# Patient Record
Sex: Male | Born: 1960
Health system: Southern US, Community
[De-identification: ages and names within clinical notes are randomized; demographics above are authoritative.]

## PROBLEM LIST (undated history)

## (undated) DIAGNOSIS — Z872 Personal history of diseases of the skin and subcutaneous tissue: Secondary | ICD-10-CM

## (undated) DIAGNOSIS — K4090 Unilateral inguinal hernia, without obstruction or gangrene, not specified as recurrent: Secondary | ICD-10-CM

## (undated) DIAGNOSIS — K2100 Gastro-esophageal reflux disease with esophagitis, without bleeding: Secondary | ICD-10-CM

## (undated) DIAGNOSIS — E781 Pure hyperglyceridemia: Secondary | ICD-10-CM

## (undated) DIAGNOSIS — I10 Essential (primary) hypertension: Secondary | ICD-10-CM

## (undated) DIAGNOSIS — C801 Malignant (primary) neoplasm, unspecified: Secondary | ICD-10-CM

## (undated) DIAGNOSIS — K219 Gastro-esophageal reflux disease without esophagitis: Secondary | ICD-10-CM

## (undated) DIAGNOSIS — K5792 Diverticulitis of intestine, part unspecified, without perforation or abscess without bleeding: Secondary | ICD-10-CM

## (undated) DIAGNOSIS — G4730 Sleep apnea, unspecified: Secondary | ICD-10-CM

## (undated) DIAGNOSIS — M069 Rheumatoid arthritis, unspecified: Secondary | ICD-10-CM

## (undated) DIAGNOSIS — M1990 Unspecified osteoarthritis, unspecified site: Secondary | ICD-10-CM

## (undated) HISTORY — PX: COLONOSCOPY: SHX174

## (undated) HISTORY — DX: Gastro-esophageal reflux disease with esophagitis, without bleeding: K21.00

## (undated) HISTORY — DX: Gastro-esophageal reflux disease with esophagitis: K21.0

## (undated) HISTORY — PX: OTHER SURGICAL HISTORY: SHX169

## (undated) HISTORY — DX: Unspecified osteoarthritis, unspecified site: M19.90

## (undated) HISTORY — PX: MELANOMA EXCISION: SHX5266

---

## 2000-10-09 HISTORY — PX: OTHER SURGICAL HISTORY: SHX169

## 2010-10-14 ENCOUNTER — Ambulatory Visit
Admission: RE | Admit: 2010-10-14 | Discharge: 2010-10-14 | Payer: Self-pay | Source: Home / Self Care | Admitting: Emergency Medicine

## 2010-10-14 DIAGNOSIS — S058X9A Other injuries of unspecified eye and orbit, initial encounter: Secondary | ICD-10-CM | POA: Insufficient documentation

## 2010-10-14 DIAGNOSIS — M069 Rheumatoid arthritis, unspecified: Secondary | ICD-10-CM | POA: Insufficient documentation

## 2010-10-17 ENCOUNTER — Telehealth (INDEPENDENT_AMBULATORY_CARE_PROVIDER_SITE_OTHER): Payer: Self-pay | Admitting: *Deleted

## 2010-11-10 NOTE — Assessment & Plan Note (Signed)
Summary: FOREIGN BODY RT EYE/WSE   Vital Signs:  Patient Profile:   50 Years Old Male  Vitals Entered By: Lajean Saver RN (October 14, 2010 6:32 PM)              Vision Screening: Left eye w/o correction: 20 / 40 Right Eye w/o correction: 20 / 40 Both eyes w/o correction:  20/ 40        Vision Entered By: Lajean Saver RN (October 14, 2010 6:55 PM)    Updated Prior Medication List: HUMIRA 40 MG/0.8ML KIT (ADALIMUMAB) every other week METHOTREXATE 2.5 MG TABS (METHOTREXATE SODIUM)  PREDNISONE 1 MG TABS (PREDNISONE) once daily  Current Allergies: No known allergies History of Present Illness History from: patient Chief Complaint: R eye irritation History of Present Illness: Was taking out trash 2 days ago and had something blow into his R eye.  Has felt that there has been something in it since then with redness, irritation, and now with pus discharge.  Visine and flushing eye helps temporarily.  Helps to keep that eye shut.  REVIEW OF SYSTEMS Constitutional Symptoms      Denies fever, chills, night sweats, weight loss, weight gain, and fatigue.  Eyes       Complains of eye pain, eye drainage, and glasses.      Denies change in vision, contact lenses, and eye surgery.      Comments: right eye Ear/Nose/Throat/Mouth       Denies hearing loss/aids, change in hearing, ear pain, ear discharge, dizziness, frequent runny nose, frequent nose bleeds, sinus problems, sore throat, hoarseness, and tooth pain or bleeding.  Respiratory       Denies dry cough, productive cough, wheezing, shortness of breath, asthma, bronchitis, and emphysema/COPD.  Cardiovascular       Denies murmurs, chest pain, and tires easily with exhertion.    Gastrointestinal       Denies stomach pain, nausea/vomiting, diarrhea, constipation, blood in bowel movements, and indigestion. Genitourniary       Denies painful urination, kidney stones, and loss of urinary control. Neurological       Denies  paralysis, seizures, and fainting/blackouts. Musculoskeletal       Denies muscle pain, joint pain, joint stiffness, decreased range of motion, redness, swelling, muscle weakness, and gout.  Skin       Denies bruising, unusual mles/lumps or sores, and hair/skin or nail changes.  Psych       Denies mood changes, temper/anger issues, anxiety/stress, speech problems, depression, and sleep problems. Other Comments: Patient c/o foreign object in right eye x last night. He has tried washing it out several times unsuccessfully. Redness and drainage are present.    Past History:  Past Medical History: Rheumatoid arthritis  Past Surgical History: Feet surgery x 2 1990s  Social History: Married Never Smoked Alcohol use-no Drug use-no Smoking Status:  never Drug Use:  no Physical Exam General appearance: well developed, well nourished, mild distress Fluoroscein exam: PERRLA EOMI, conjuctival and scleral erythema, small hair found but no other foreign bodies seen, no dendritic lesions, corneal abrasion at 12 o'clock seen with possible very small ulcer.  After tetracaine, patient able to open eye and states that he feels better and vision is improved.  He has purulent discharge and photophobia as well. Assessment New Problems: CORNEAL ABRASION, RIGHT (ICD-918.1) RHEUMATOID ARTHRITIS (ICD-714.0)   Patient Education: Patient and/or caregiver instructed in the following: rest, fluids.  Plan New Medications/Changes: POLYTRIM 10000-0.1 UNIT/ML-% SOLN (POLYMYXIN B-TRIMETHOPRIM) 2 drops  R eye Q3 hrs for 2 days, then Q8 hrs for 1 week  #1 bottle x 0, 10/14/2010, Hoyt Koch MD  New Orders: New Patient Level IV [phone] Planning Comments:   Fluoroscein exam done in clinic.  Rx for Polytrim.  If eye is not feeling dramatically better in 1-2 days, would suggest having him see ophthalmology on Monday when they open.  If visual problems or worsening, then go to ER.  Use drops as directed, can  continue to use sterile saline to flush, and wipe crust with warm washcloth.   The patient and/or caregiver has been counseled thoroughly with regard to medications prescribed including dosage, schedule, interactions, rationale for use, and possible side effects and they verbalize understanding.  Diagnoses and expected course of recovery discussed and will return if not improved as expected or if the condition worsens. Patient and/or caregiver verbalized understanding.  Prescriptions: POLYTRIM 10000-0.1 UNIT/ML-% SOLN (POLYMYXIN B-TRIMETHOPRIM) 2 drops R eye Q3 hrs for 2 days, then Q8 hrs for 1 week  #1 bottle x 0   Entered and Authorized by:   Hoyt Koch MD   Signed by:   Hoyt Koch MD on 10/14/2010   Method used:   Print then Give to Patient   RxID:   [id number]   Orders Added: 1)  New Patient Level IV [95621]  Appended Document: FOREIGN BODY RT EYE/WSE Vitals: 137/87 HR: 83 RR: 16 O2sat: 97% Temp: 97.4 74" 191.2 lbs

## 2010-11-10 NOTE — Progress Notes (Signed)
  Phone Note Outgoing Call Call back at [facility] Phone [phone]   Call placed by: Lajean Saver RN,  October 17, 2010 10:58 AM Call placed to: Patient Action Taken: Phone Call Completed Summary of Call: Callback: Patient reports his eye is better. He is still a little light sensitive. He will call back if it gets worse.

## 2011-08-10 ENCOUNTER — Encounter: Payer: Self-pay | Admitting: Family Medicine

## 2011-08-15 ENCOUNTER — Ambulatory Visit (INDEPENDENT_AMBULATORY_CARE_PROVIDER_SITE_OTHER): Payer: Managed Care, Other (non HMO) | Admitting: Family Medicine

## 2011-08-15 ENCOUNTER — Encounter: Payer: Self-pay | Admitting: Family Medicine

## 2011-08-15 VITALS — BP 154/91 | HR 75 | Ht 72.0 in | Wt 196.0 lb

## 2011-08-15 DIAGNOSIS — Z23 Encounter for immunization: Secondary | ICD-10-CM

## 2011-08-15 DIAGNOSIS — I10 Essential (primary) hypertension: Secondary | ICD-10-CM | POA: Insufficient documentation

## 2011-08-15 DIAGNOSIS — Z0000 Encounter for general adult medical examination without abnormal findings: Secondary | ICD-10-CM

## 2011-08-15 DIAGNOSIS — R319 Hematuria, unspecified: Secondary | ICD-10-CM

## 2011-08-15 DIAGNOSIS — Z136 Encounter for screening for cardiovascular disorders: Secondary | ICD-10-CM

## 2011-08-15 DIAGNOSIS — R030 Elevated blood-pressure reading, without diagnosis of hypertension: Secondary | ICD-10-CM

## 2011-08-15 MED ORDER — ESOMEPRAZOLE MAGNESIUM 40 MG PO CPDR: 40.0000 mg | DELAYED_RELEASE_CAPSULE | Freq: Every day | ORAL | Status: DC

## 2011-08-15 MED ORDER — CIPROFLOXACIN HCL 500 MG PO TABS: 500.0000 mg | ORAL_TABLET | Freq: Two times a day (BID) | ORAL | Status: AC

## 2011-08-15 NOTE — Patient Instructions (Signed)
Hematuria, Adult Hematuria (blood in your urine) can be caused by a bladder infection (cystitis), kidney infection (pyelonephritis), prostate infection (prostatitis), or kidney stone. Infections will usually respond to antibiotics (medications which kill germs), and a kidney stone will usually pass through your urine without further treatment. If you were put on antibiotics, take all the medicine until gone. You may feel better in a few days, but take all of your medicine or the infection may not respond and become more difficult to treat. If antibiotics were not given, an infection did not cause the blood in the urine. A further work up to find out the reason may be needed. HOME CARE INSTRUCTIONS   Drink lots of fluid, 3 to 4 quarts a day. If you have been diagnosed with an infection, cranberry juice is especially recommended, in addition to large amounts of water.   Avoid caffeine, tea, and carbonated beverages, because they tend to irritate the bladder.   Avoid alcohol as it may irritate the prostate.   Only take over-the-counter or prescription medicines for pain, discomfort, or fever as directed by your caregiver.   If you have been diagnosed with a kidney stone follow your caregivers instructions regarding straining your urine to catch the stone.  TO PREVENT FURTHER INFECTIONS:  Empty the bladder often. Avoid holding urine for long periods of time.   After a bowel movement, women should cleanse front to back. Use each tissue only once.   Empty the bladder before and after sexual intercourse if you are a male.   Return to your caregiver if you develop back pain, fever, nausea (feeling sick to your stomach), vomiting, or your symptoms (problems) are not better in 3 days. Return sooner if you are getting worse.  If you have been requested to return for further testing make sure to keep your appointments. If an infection is not the cause of blood in your urine, X-rays may be required. Your  caregiver will discuss this with you. SEEK IMMEDIATE MEDICAL CARE IF:   You have a persistent fever over 102 F (38.9 C).   You develop severe vomiting and are unable to keep the medication down.   You develop severe back or abdominal pain despite taking your medications.   You begin passing a large amount of blood or clots in your urine.   You feel extremely weak or faint, or pass out.  MAKE SURE YOU:   Understand these instructions.   Will watch your condition.   Will get help right away if you are not doing well or get worse.  Document Released: 09/25/2005 Document Revised: 06/07/2011 Document Reviewed: 05/14/2008 [facility] Patient Information 2012 [place], [place].Gastroesophageal Reflux Disease, Adult Gastroesophageal reflux disease (GERD) happens when acid from your stomach flows up into the esophagus. When acid comes in contact with the esophagus, the acid causes soreness (inflammation) in the esophagus. Over time, GERD may create small holes (ulcers) in the lining of the esophagus. CAUSES   Increased body weight. This puts pressure on the stomach, making acid rise from the stomach into the esophagus.   Smoking. This increases acid production in the stomach.   Drinking alcohol. This causes decreased pressure in the lower esophageal sphincter (valve or ring of muscle between the esophagus and stomach), allowing acid from the stomach into the esophagus.   Late evening meals and a full stomach. This increases pressure and acid production in the stomach.   A malformed lower esophageal sphincter.  Sometimes, no cause is found. SYMPTOMS  Burning pain in the lower part of the mid-chest behind the breastbone and in the mid-stomach area. This may occur twice a week or more often.   Trouble swallowing.   Sore throat.   Dry cough.   Asthma-like symptoms including chest tightness, shortness of breath, or wheezing.  DIAGNOSIS  Your caregiver may be able to diagnose GERD based  on your symptoms. In some cases, X-rays and other tests may be done to check for complications or to check the condition of your stomach and esophagus. TREATMENT  Your caregiver may recommend over-the-counter or prescription medicines to help decrease acid production. Ask your caregiver before starting or adding any new medicines.  HOME CARE INSTRUCTIONS   Change the factors that you can control. Ask your caregiver for guidance concerning weight loss, quitting smoking, and alcohol consumption.   Avoid foods and drinks that make your symptoms worse, such as:   Caffeine or alcoholic drinks.   Chocolate.   Peppermint or mint flavorings.   Garlic and onions.   Spicy foods.   Citrus fruits, such as oranges, lemons, or limes.   Tomato-based foods such as sauce, chili, salsa, and pizza.   Fried and fatty foods.   Avoid lying down for the 3 hours prior to your bedtime or prior to taking a nap.   Eat small, frequent meals instead of large meals.   Wear loose-fitting clothing. Do not wear anything tight around your waist that causes pressure on your stomach.   Raise the head of your bed 6 to 8 inches with wood blocks to help you sleep. Extra pillows will not help.   Only take over-the-counter or prescription medicines for pain, discomfort, or fever as directed by your caregiver.   Do not take aspirin, ibuprofen, or other nonsteroidal anti-inflammatory drugs (NSAIDs).  SEEK IMMEDIATE MEDICAL CARE IF:   You have pain in your arms, neck, jaw, teeth, or back.   Your pain increases or changes in intensity or duration.   You develop nausea, vomiting, or sweating (diaphoresis).   You develop shortness of breath, or you faint.   Your vomit is green, yellow, black, or looks like coffee grounds or blood.   Your stool is red, bloody, or black.  These symptoms could be signs of other problems, such as heart disease, gastric bleeding, or esophageal bleeding. MAKE SURE YOU:   Understand  these instructions.   Will watch your condition.   Will get help right away if you are not doing well or get worse.  Document Released: 07/05/2005 Document Revised: 06/07/2011 Document Reviewed: 04/14/2011 [facility] Patient Information 2012 [place], [place].

## 2011-08-15 NOTE — Progress Notes (Signed)
Subjective:    Patient ID: Todd Valdez, male    DOB: [birth year], 50 y.o.   MRN: [id number]  HPI  Patient's here for physical examination health maintenance he needs to have immunization given and he sees a rheumatologist a regular basis for her arthritis. He reports having some increased indigestion of reflux . He realizes that he also needs to have a colonoscopy he is a 48 not had one. He has had his flu shot given earlier. No history of hypertension or diabetes at this time.  Review of Systems  Constitutional: Negative for fever, chills, diaphoresis, activity change, appetite change, fatigue and unexpected weight change.  HENT: Negative for facial swelling, sneezing, drooling, neck pain and dental problem.   Cardiovascular: Positive for chest pain and leg swelling. Negative for palpitations.  Genitourinary: Negative for frequency and flank pain.       Reflux  Musculoskeletal: Positive for myalgias, joint swelling and arthralgias.  Skin: Negative for color change and pallor.  Neurological: Negative for seizures, weakness and numbness.  All other systems reviewed and are negative.   BP 154/91  Pulse 75  Ht 6' (1.829 m)  Wt 196 lb (88.905 kg)  BMI 26.58 kg/m2  SpO2 96% No Known Allergies History   Social History  . Marital Status: Married    Spouse Name: N/A    Number of Children: N/A  . Years of Education: N/A   Occupational History  . Not on file.   Social History Main Topics  . Smoking status: Former Smoker    Quit date: 02/07/1991  . Smokeless tobacco: Former Neurosurgeon    Quit date: 08/14/2009  . Alcohol Use: No  . Drug Use: No  . Sexually Active: Yes -- Male partner(s)     married.    Other Topics Concern  . Not on file   Social History Narrative  . No narrative on file   Family History  Problem Relation Age of Onset  . Hypertension Mother   . Hypertension Father    Past Medical History  Diagnosis Date  . Arthritis   . Reflux esophagitis    Past  Surgical History  Procedure Date  . Feet surgery   . Bilateral feet reconstruction 10/2000        Objective:   Physical Exam  Vitals reviewed. Constitutional: He is oriented to person, place, and time. He appears well-developed and well-nourished.  HENT:  Head: Normocephalic.  Eyes: Conjunctivae and EOM are normal. Pupils are equal, round, and reactive to light.  Neck: Normal range of motion. Neck supple.  Cardiovascular: Normal rate.   Pulmonary/Chest: Effort normal and breath sounds normal. No respiratory distress. He has no wheezes. He has no rales.  Abdominal: Soft. Bowel sounds are normal. He exhibits no distension. There is no tenderness. There is no rebound and no guarding.  Genitourinary: Rectum normal, prostate normal and penis normal. Guaiac negative stool. No penile tenderness.  Musculoskeletal: Normal range of motion. He exhibits no edema and no tenderness.       Arthritic changes of the hads and feet  Neurological: He is alert and oriented to person, place, and time. He has normal reflexes. No cranial nerve deficit. Coordination normal.  Skin: Skin is warm and dry.  Psychiatric: He has a normal mood and affect.          Assessment & Plan:  #1Rheumatoid arthritis  #2 GERD stool his next blood we will place him on a proton pump inhibitor PPI also get an EKG  just to make sure this abdominal discomfort is not as typical angina #3 elevated blood pressure we'll recheck his blood pressure  #4 health maintenance will refer her to GI for colonoscopy and possible upper endoscopy  #5 immunizations we'll give tetanus today and a DTaP he has had a flu shot. Return if everything is within normal parameters in a year course get lab work PSA lipid H. pylori A1c and TSH. #6 hematuria patient had blood in his urine this is urine was checked after he had his prostate exam we'll place him Cipro for 10 days and return a month for recheck  if still present will get ae urological  referral. #7 flu shot already given we'll give a DTaP

## 2011-08-16 LAB — LIPID PANEL
HDL: 28 mg/dL — ABNORMAL LOW (ref >39)
LDL Cholesterol: 110 mg/dL — ABNORMAL HIGH (ref 0–99)
Total CHOL/HDL Ratio: 6.8 Ratio
Triglycerides: 260 mg/dL — ABNORMAL HIGH (ref <150)

## 2011-08-16 LAB — COMPREHENSIVE METABOLIC PANEL
ALT: 11 U/L (ref 0–53)
BUN: 19 mg/dL (ref 6–23)
CO2: 27 mEq/L (ref 19–32)
Calcium: 9.4 mg/dL (ref 8.4–10.5)
Chloride: 103 mEq/L (ref 96–112)
Creat: 0.8 mg/dL (ref 0.50–1.35)
Total Bilirubin: 0.3 mg/dL (ref 0.3–1.2)

## 2011-08-16 LAB — CBC WITH DIFFERENTIAL/PLATELET
Eosinophils Absolute: 0.5 10*3/uL (ref 0.0–0.7)
Eosinophils Relative: 6 % — ABNORMAL HIGH (ref 0–5)
HCT: 42.6 % (ref 39.0–52.0)
Lymphocytes Relative: 28 % (ref 12–46)
Lymphs Abs: 2.4 10*3/uL (ref 0.7–4.0)
MCH: 25.6 pg — ABNORMAL LOW (ref 26.0–34.0)
MCV: 84 fL (ref 78.0–100.0)
Monocytes Absolute: 0.6 10*3/uL (ref 0.1–1.0)
Platelets: 461 10*3/uL — ABNORMAL HIGH (ref 150–400)
RDW: 16.6 % — ABNORMAL HIGH (ref 11.5–15.5)

## 2011-08-16 LAB — TSH: TSH: 4.72 u[IU]/mL — ABNORMAL HIGH (ref 0.350–4.500)

## 2011-08-17 LAB — H. PYLORI ANTIBODY, IGG: H Pylori IgG: 0.53 {ISR}

## 2011-08-18 LAB — CULTURE, URINE COMPREHENSIVE: Colony Count: NO GROWTH

## 2011-08-22 ENCOUNTER — Encounter: Payer: Self-pay | Admitting: Family Medicine

## 2011-08-24 ENCOUNTER — Encounter: Payer: Self-pay | Admitting: Family Medicine

## 2011-09-11 ENCOUNTER — Encounter: Payer: Self-pay | Admitting: Family Medicine

## 2011-09-13 ENCOUNTER — Other Ambulatory Visit: Payer: Self-pay | Admitting: *Deleted

## 2011-09-13 MED ORDER — OMEPRAZOLE 40 MG PO CPDR: 40.0000 mg | DELAYED_RELEASE_CAPSULE | Freq: Every day | ORAL | Status: DC

## 2011-09-13 NOTE — Telephone Encounter (Signed)
Insurance will not cover Nexium anymore requested to change Pt to omeprazole.

## 2011-09-14 ENCOUNTER — Ambulatory Visit (INDEPENDENT_AMBULATORY_CARE_PROVIDER_SITE_OTHER): Payer: Managed Care, Other (non HMO) | Admitting: Family Medicine

## 2011-09-14 ENCOUNTER — Encounter: Payer: Self-pay | Admitting: Family Medicine

## 2011-09-14 VITALS — BP 144/88 | HR 78 | Ht 70.0 in | Wt 201.0 lb

## 2011-09-14 DIAGNOSIS — K219 Gastro-esophageal reflux disease without esophagitis: Secondary | ICD-10-CM

## 2011-09-14 DIAGNOSIS — E785 Hyperlipidemia, unspecified: Secondary | ICD-10-CM

## 2011-09-14 DIAGNOSIS — R319 Hematuria, unspecified: Secondary | ICD-10-CM

## 2011-09-14 DIAGNOSIS — Z87448 Personal history of other diseases of urinary system: Secondary | ICD-10-CM

## 2011-09-14 LAB — POCT URINALYSIS DIPSTICK
Blood, UA: NEGATIVE
Glucose, UA: NEGATIVE
Ketones, UA: NEGATIVE
Protein, UA: NEGATIVE
Spec Grav, UA: 1.025
Urobilinogen, UA: 0.2

## 2011-09-14 MED ORDER — OMEGA-3-ACID ETHYL ESTERS 1 G PO CAPS: 2.0000 g | ORAL_CAPSULE | Freq: Two times a day (BID) | ORAL | Status: DC

## 2011-09-14 MED ORDER — OMEPRAZOLE-SODIUM BICARBONATE 40-1100 MG PO CAPS: 1.0000 | ORAL_CAPSULE | Freq: Every day | ORAL | Status: DC

## 2011-09-14 NOTE — Patient Instructions (Signed)
Hypertriglyceridemia  Diet for High blood levels of Triglycerides Most fats in food are triglycerides. Triglycerides in your blood are stored as fat in your body. High levels of triglycerides in your blood may put you at a greater risk for heart disease and stroke.  Normal triglyceride levels are less than 150 mg/dL. Borderline high levels are 150-199 mg/dl. High levels are 200 - 499 mg/dL, and very high triglyceride levels are greater than 500 mg/dL. The decision to treat high triglycerides is generally based on the level. For people with borderline or high triglyceride levels, treatment includes weight loss and exercise. Drugs are recommended for people with very high triglyceride levels. Many people who need treatment for high triglyceride levels have metabolic syndrome. This syndrome is a collection of disorders that often include: insulin resistance, high blood pressure, blood clotting problems, high cholesterol and triglycerides. TESTING PROCEDURE FOR TRIGLYCERIDES  You should not eat 4 hours before getting your triglycerides measured. The normal range of triglycerides is between 10 and 250 milligrams per deciliter (mg/dl). Some people may have extreme levels (1000 or above), but your triglyceride level may be too high if it is above 150 mg/dl, depending on what other risk factors you have for heart disease.   People with high blood triglycerides may also have high blood cholesterol levels. If you have high blood cholesterol as well as high blood triglycerides, your risk for heart disease is probably greater than if you only had high triglycerides. High blood cholesterol is one of the main risk factors for heart disease.  CHANGING YOUR DIET  Your weight can affect your blood triglyceride level. If you are more than 20% above your ideal body weight, you may be able to lower your blood triglycerides by losing weight. Eating less and exercising regularly is the best way to combat this. Fat provides  more calories than any other food. The best way to lose weight is to eat less fat. Only 30% of your total calories should come from fat. Less than 7% of your diet should come from saturated fat. A diet low in fat and saturated fat is the same as a diet to decrease blood cholesterol. By eating a diet lower in fat, you may lose weight, lower your blood cholesterol, and lower your blood triglyceride level.  Eating a diet low in fat, especially saturated fat, may also help you lower your blood triglyceride level. Ask your dietitian to help you figure how much fat you can eat based on the number of calories your caregiver has prescribed for you.  Exercise, in addition to helping with weight loss may also help lower triglyceride levels.   Alcohol can increase blood triglycerides. You may need to stop drinking alcoholic beverages.   Too much carbohydrate in your diet may also increase your blood triglycerides. Some complex carbohydrates are necessary in your diet. These may include bread, rice, potatoes, other starchy vegetables and cereals.   Reduce "simple" carbohydrates. These may include pure sugars, candy, honey, and jelly without losing other nutrients. If you have the kind of high blood triglycerides that is affected by the amount of carbohydrates in your diet, you will need to eat less sugar and less high-sugar foods. Your caregiver can help you with this.   Adding 2-4 grams of fish oil (EPA+ DHA) may also help lower triglycerides. Speak with your caregiver before adding any supplements to your regimen.  Following the Diet  Maintain your ideal weight. Your caregivers can help you with a diet. Generally,   eating less food and getting more exercise will help you lose weight. Joining a weight control group may also help. Ask your caregivers for a good weight control group in your area.  Eat low-fat foods instead of high-fat foods. This can help you lose weight too.  These foods are lower in fat. Eat MORE  of these:   Dried beans, peas, and lentils.   Egg whites.   Low-fat cottage cheese.   Fish.   Lean cuts of meat, such as round, sirloin, rump, and flank (cut extra fat off meat you fix).   Whole grain breads, cereals and pasta.   Skim and nonfat dry milk.   Low-fat yogurt.   Poultry without the skin.   Cheese made with skim or part-skim milk, such as mozzarella, parmesan, farmers', ricotta, or pot cheese.  These are higher fat foods. Eat LESS of these:   Whole milk and foods made from whole milk, such as American, blue, cheddar, monterey jack, and swiss cheese   High-fat meats, such as luncheon meats, sausages, knockwurst, bratwurst, hot dogs, ribs, corned beef, ground pork, and regular ground beef.   Fried foods.  Limit saturated fats in your diet. Substituting unsaturated fat for saturated fat may decrease your blood triglyceride level. You will need to read package labels to know which products contain saturated fats.  These foods are high in saturated fat. Eat LESS of these:   Fried pork skins.   Whole milk.   Skin and fat from poultry.   Palm oil.   Butter.   Shortening.   Cream cheese.   Bacon.   Margarines and baked goods made from listed oils.   Vegetable shortenings.   Chitterlings.   Fat from meats.   Coconut oil.   Palm kernel oil.   Lard.   Cream.   Sour cream.   Fatback.   Coffee whiteners and non-dairy creamers made with these oils.   Cheese made from whole milk.  Use unsaturated fats (both polyunsaturated and monounsaturated) moderately. Remember, even though unsaturated fats are better than saturated fats; you still want a diet low in total fat.  These foods are high in unsaturated fat:   Canola oil.   Sunflower oil.   Mayonnaise.   Almonds.   Peanuts.   Pine nuts.   Margarines made with these oils.   Safflower oil.   Olive oil.   Avocados.   Cashews.   Peanut butter.   Sunflower seeds.   Soybean oil.     Peanut oil.   Olives.   Pecans.   Walnuts.   Pumpkin seeds.  Avoid sugar and other high-sugar foods. This will decrease carbohydrates without decreasing other nutrients. Sugar in your food goes rapidly to your blood. When there is excess sugar in your blood, your liver may use it to make more triglycerides. Sugar also contains calories without other important nutrients.  Eat LESS of these:   Sugar, brown sugar, powdered sugar, jam, jelly, preserves, honey, syrup, molasses, pies, candy, cakes, cookies, frosting, pastries, colas, soft drinks, punches, fruit drinks, and regular gelatin.   Avoid alcohol. Alcohol, even more than sugar, may increase blood triglycerides. In addition, alcohol is high in calories and low in nutrients. Ask for sparkling water, or a diet soft drink instead of an alcoholic beverage.  Suggestions for planning and preparing meals   Bake, broil, grill or roast meats instead of frying.   Remove fat from meats and skin from poultry before cooking.   Add spices,   herbs, lemon juice or vinegar to vegetables instead of salt, rich sauces or gravies.   Use a non-stick skillet without fat or use no-stick sprays.   Cool and refrigerate stews and broth. Then remove the hardened fat floating on the surface before serving.   Refrigerate meat drippings and skim off fat to make low-fat gravies.   Serve more fish.   Use less butter, margarine and other high-fat spreads on bread or vegetables.   Use skim or reconstituted non-fat dry milk for cooking.   Cook with low-fat cheeses.   Substitute low-fat yogurt or cottage cheese for all or part of the sour cream in recipes for sauces, dips or congealed salads.   Use half yogurt/half mayonnaise in salad recipes.   Substitute evaporated skim milk for cream. Evaporated skim milk or reconstituted non-fat dry milk can be whipped and substituted for whipped cream in certain recipes.   Choose fresh fruits for dessert instead of  high-fat foods such as pies or cakes. Fruits are naturally low in fat.  When Dining Out   Order low-fat appetizers such as fruit or vegetable juice, pasta with vegetables or tomato sauce.   Select clear, rather than cream soups.   Ask that dressings and gravies be served on the side. Then use less of them.   Order foods that are baked, broiled, poached, steamed, stir-fried, or roasted.   Ask for margarine instead of butter, and use only a small amount.   Drink sparkling water, unsweetened tea or coffee, or diet soft drinks instead of alcohol or other sweet beverages.  QUESTIONS AND ANSWERS ABOUT OTHER FATS IN THE BLOOD: SATURATED FAT, TRANS FAT, AND CHOLESTEROL What is trans fat? Trans fat is a type of fat that is formed when vegetable oil is hardened through a process called hydrogenation. This process helps makes foods more solid, gives them shape, and prolongs their shelf life. Trans fats are also called hydrogenated or partially hydrogenated oils.  What do saturated fat, trans fat, and cholesterol in foods have to do with heart disease? Saturated fat, trans fat, and cholesterol in the diet all raise the level of LDL "bad" cholesterol in the blood. The higher the LDL cholesterol, the greater the risk for coronary heart disease (CHD). Saturated fat and trans fat raise LDL similarly.  What foods contain saturated fat, trans fat, and cholesterol? High amounts of saturated fat are found in animal products, such as fatty cuts of meat, chicken skin, and full-fat dairy products like butter, whole milk, cream, and cheese, and in tropical vegetable oils such as palm, palm kernel, and coconut oil. Trans fat is found in some of the same foods as saturated fat, such as vegetable shortening, some margarines (especially hard or stick margarine), crackers, cookies, baked goods, fried foods, salad dressings, and other processed foods made with partially hydrogenated vegetable oils. Small amounts of trans fat  also occur naturally in some animal products, such as milk products, beef, and lamb. Foods high in cholesterol include liver, other organ meats, egg yolks, shrimp, and full-fat dairy products. How can I use the new food label to make heart-healthy food choices? Check the Nutrition Facts panel of the food label. Choose foods lower in saturated fat, trans fat, and cholesterol. For saturated fat and cholesterol, you can also use the Percent Daily Value (%DV): 5% DV or less is low, and 20% DV or more is high. (There is no %DV for trans fat.) Use the Nutrition Facts panel to choose foods low in   saturated fat and cholesterol, and if the trans fat is not listed, read the ingredients and limit products that list shortening or hydrogenated or partially hydrogenated vegetable oil, which tend to be high in trans fat. POINTS TO REMEMBER: YOU NEED A LITTLE TLC (THERAPEUTIC LIFESTYLE CHANGES)  Discuss your risk for heart disease with your caregivers, and take steps to reduce risk factors.   Change your diet. Choose foods that are low in saturated fat, trans fat, and cholesterol.   Add exercise to your daily routine if it is not already being done. Participate in physical activity of moderate intensity, like brisk walking, for at least 30 minutes on most, and preferably all days of the week. No time? Break the 30 minutes into three, 10-minute segments during the day.   Stop smoking. If you do smoke, contact your caregiver to discuss ways in which they can help you quit.   Do not use street drugs.   Maintain a normal weight.   Maintain a healthy blood pressure.   Keep up with your blood work for checking the fats in your blood as directed by your caregiver.  Document Released: 07/13/2004 Document Revised: 06/07/2011 Document Reviewed: 02/08/2009 [facility] Patient Information 2012 [place], [place].Fish Oil, Omega-3 Fatty Acids capsules (Rx) What is this medicine? FISH OIL, OMEGA-3 FATTY ACIDS (Fish Oil, oh MAY  ga 3 fatty AS ids) are essential fats. It is used to treat high triglyceride levels. This medicine may be used for other purposes; ask your health care provider or pharmacist if you have questions. What should I tell my health care provider before I take this medicine? They need to know if you have any of these conditions -bleeding problems -lung or breathing disease, like asthma -an unusual or allergic reaction to fish oil, omega-3 fatty acids, fish, other medicines, foods, dyes, or preservatives -pregnant or trying to get pregnant -breast-feeding How should I use this medicine? Take this medicine by mouth with a glass of water. Follow the directions on the prescription label. Take with food. Take your medicine at regular intervals. Do not take your medicine more often than directed. Talk to your pediatrician regarding the use of this medicine in children. Special care may be needed. Overdosage: If you think you have taken too much of this medicine contact a poison control center or emergency room at once. NOTE: This medicine is only for you. Do not share this medicine with others. What if I miss a dose? If you miss a dose, take it as soon as you can. If it is almost time for your next dose, take only that dose. Do not take double or extra doses. What may interact with this medicine? -aspirin and aspirin-like medicines -herbal products like danshen, dong quai, garlic pills, ginger, ginkgo biloba, horse chestnut, willow bark, and others -medicines that treat or prevent blood clots like enoxaparin, heparin, warfarin This list may not describe all possible interactions. Give your health care provider a list of all the medicines, herbs, non-prescription drugs, or dietary supplements you use. Also tell them if you smoke, drink alcohol, or use illegal drugs. Some items may interact with your medicine. What should I watch for while using this medicine? Follow a good diet and exercise plan. Taking  this medicine does not replace a healthy lifestyle. Some foods that have omega-3 fatty acids naturally are fatty fish like albacore tuna, halibut, herring, mackerel, lake trout, salmon, and sardines. If you are scheduled for any medical or dental procedure, tell your healthcare provider  that you are taking this medicine. You may need to stop taking this medicine before the procedure. What side effects may I notice from receiving this medicine? Side effects that you should report to your doctor or health care professional as soon as possible: -allergic reactions like skin rash, itching or hives, swelling of the face, lips, or tongue -breathing problems -chest pain -fever, infection -unusual bleeding or bruising Side effects that usually do not require medical attention (report to your doctor or health care professional if they continue or are bothersome): -bad or fishy breath -belching -body odor -diarrhea -nausea -stomach gas, upset -weight gain This list may not describe all possible side effects. Call your doctor for medical advice about side effects. You may report side effects to FDA at 1-800-FDA-1088. Where should I keep my medicine? Keep out of the reach of children. Store at room temperature between 15 and 30 degrees C (59 and 86 degrees F). Do not freeze. Throw away any unused medicine after the expiration date. NOTE: This sheet is a summary. It may not cover all possible information. If you have questions about this medicine, talk to your doctor, pharmacist, or health care provider.  2012, [employer]. (12/12/2007 12:55:14 PM)

## 2011-09-16 NOTE — Progress Notes (Signed)
  Subjective:    Patient ID: Todd Valdez, male    DOB: 1961-01-13, 50 y.o.   MRN: [id number]  Gastrophageal Reflux   Patient is here for several things.  #1 hematuria follow up concern if referral is needed #2Gerd like symptoms. H py;lori negative. Also his insurance has informed him they will not pay for nexium anymore which he states worked great #3 elevated triglyceride  discuss what normal total cholesterol can be deceiving until you evaluate what it is composed of. #4 immunization update   Review of Systems  All other systems reviewed and are negative.   BP 144/88  Pulse 78  Ht 5\' 10"  (1.778 m)  Wt 201 lb (91.173 kg)  BMI 28.84 kg/m2  SpO2 96%    Objective:   Physical Exam  Constitutional: He is oriented to person, place, and time. He appears well-developed and well-nourished.  Musculoskeletal:       Arthritic changes in both hands  Neurological: He is alert and oriented to person, place, and time.  Skin: Skin is warm.  Psychiatric: He has a normal mood and affect. His behavior is normal. Thought content normal.    Results for orders placed in visit on 09/14/11  POCT URINALYSIS DIPSTICK      Component Value Range   Color, UA yellow     Clarity, UA clear     Glucose, UA neg     Bilirubin, UA neg     Ketones, UA neg     Spec Grav, UA 1.025     Blood, UA neg     pH, UA 5.5     Protein, UA neg     Urobilinogen, UA 0.2     Nitrite, UA neg     Leukocytes, UA Negative          Assessment & Plan:  #1 hematuria resolved #2 Referral to GI has been scheduled for upper and lower endoscopy. Placed on Zegrid 40 mg capsule po q day and if any problem would put on 2nd choice of Protonix. #3 recommend fish oil . Either the Lovaza w/samples given and a scrip w/2 capsules   Twice a day or all natural nutrilite.  #4 immunization records updated

## 2011-09-25 ENCOUNTER — Telehealth: Payer: Self-pay | Admitting: *Deleted

## 2011-09-25 NOTE — Telephone Encounter (Signed)
Pt states he has tried Prilosec since last OV and it is not working at all. Pt is hard having a hard time eating bc he is pain afterwards. Pt would like to know if you can write a letter to insurance company to cover Nexium. Please advise.

## 2011-09-26 NOTE — Telephone Encounter (Signed)
Kim I do not have a problem with that. But will they make [place] try a  2nd agent and if not is there a form letter they want me to fill rather than dictate a letter. Can you research this for me.

## 2011-09-26 NOTE — Telephone Encounter (Signed)
Marcelino Duster this will need a prior auth. Can you please do this with his insurance

## 2011-09-27 MED ORDER — ESOMEPRAZOLE MAGNESIUM 40 MG PO CPDR: 40.0000 mg | DELAYED_RELEASE_CAPSULE | Freq: Every day | ORAL | Status: DC

## 2011-09-27 NOTE — Telephone Encounter (Signed)
[employer] approved Nexium. Wife aware

## 2011-10-31 ENCOUNTER — Encounter: Payer: Self-pay | Admitting: *Deleted

## 2011-10-31 ENCOUNTER — Emergency Department (INDEPENDENT_AMBULATORY_CARE_PROVIDER_SITE_OTHER)
Admission: EM | Admit: 2011-10-31 | Discharge: 2011-10-31 | Disposition: A | Discharge status: Home / Self Care | Payer: Managed Care, Other (non HMO) | Source: Home / Self Care | Attending: Emergency Medicine | Admitting: Emergency Medicine

## 2011-10-31 DIAGNOSIS — R112 Nausea with vomiting, unspecified: Secondary | ICD-10-CM

## 2011-10-31 HISTORY — DX: Gastro-esophageal reflux disease without esophagitis: K21.9

## 2011-10-31 HISTORY — DX: Diverticulitis of intestine, part unspecified, without perforation or abscess without bleeding: K57.92

## 2011-10-31 MED ORDER — GI COCKTAIL ~~LOC~~
30.0000 mL | Freq: Once | ORAL | Status: AC
  Administered 2011-10-31: 30 mL via ORAL

## 2011-10-31 MED ORDER — PROMETHAZINE HCL 25 MG/ML IJ SOLN
25.0000 mg | Freq: Once | INTRAMUSCULAR | Status: AC
  Administered 2011-10-31: 25 mg via INTRAVENOUS

## 2011-10-31 MED ORDER — SODIUM CHLORIDE 0.9 % IV SOLN
Freq: Once | INTRAVENOUS | Status: AC
  Administered 2011-10-31: 09:00:00 via INTRAVENOUS

## 2011-10-31 NOTE — ED Provider Notes (Signed)
History     CSN: [id number]  Arrival date & time 10/31/11  0804   First MD Initiated Contact with Patient 10/31/11 [phone]      Chief Complaint  Patient presents with  . Emesis    (Consider location/radiation/quality/duration/timing/severity/associated sxs/prior treatment) HPI This patient had endoscopy last week and since then has had many episodes of reflux and throwing up. He states that he is unable to keep anything down including the nausea medicine and he's been taking. He was prescribed Zofran and Phenergan to use alternating but is not helping. He states that he is able Cipro fluids but in general everything else comes back up. He called his GI and they will see him tomorrow and suggested he get to the emergency room. No fever or chills. No diarrhea.  Past Medical History  Diagnosis Date  . Arthritis   . Reflux esophagitis   . GERD (gastroesophageal reflux disease)   . Diverticulitis     Past Surgical History  Procedure Date  . Feet surgery   . Bilateral feet reconstruction 10/2000    Family History  Problem Relation Age of Onset  . Hypertension Mother   . Hypertension Father     History  Substance Use Topics  . Smoking status: Former Smoker    Quit date: 02/07/1991  . Smokeless tobacco: Former Neurosurgeon    Quit date: 08/14/2009  . Alcohol Use: No      Review of Systems  Allergies  Review of patient's allergies indicates no known allergies.  Home Medications   Current Outpatient Rx  Name Route Sig Dispense Refill  . ADALIMUMAB 40 MG/0.8ML Vista KIT Subcutaneous Inject 40 mg into the skin every 14 (fourteen) days.      Marland Kitchen ESOMEPRAZOLE MAGNESIUM 40 MG PO CPDR Oral Take 1 capsule (40 mg total) by mouth daily before breakfast. 30 capsule 11  . METHOTREXATE 2.5 MG PO TABS Oral Take 2.5 mg by mouth once a week. Caution:Chemotherapy. Protect from light.     . OMEGA-3-ACID ETHYL ESTERS 1 G PO CAPS Oral Take 2 capsules (2 g total) by mouth 2 (two) times daily. 60  capsule 6  . POLYMYXIN B-TRIMETHOPRIM 10000-0.1 UNIT/ML-% OP SOLN Right Eye Place 2 drops into the right eye every 3 (three) hours.        BP 150/102  Pulse 107  Temp(Src) 98.6 F (37 C) (Oral)  Resp 16  Ht 6' (1.829 m)  Wt 188 lb (85.276 kg)  BMI 25.50 kg/m2  SpO2 96%  Physical Exam  Nursing note and vitals reviewed. Constitutional: He is oriented to person, place, and time. He appears well-developed and well-nourished.  HENT:  Head: Normocephalic and atraumatic.  Nose: Nose normal.  Mouth/Throat: Oropharynx is clear and moist and mucous membranes are normal.  Eyes: No scleral icterus.  Neck: Neck supple.  Cardiovascular: Regular rhythm and normal heart sounds.   Pulmonary/Chest: Effort normal and breath sounds normal. No respiratory distress.  Abdominal: Soft. Normal appearance. There is tenderness in the epigastric area. There is no rigidity, no rebound, no guarding, no tenderness at McBurney's point and negative Murphy's sign.  Musculoskeletal:       He has rheumatoid arthritis changes of the hands. There is normal skin turgor.  Neurological: He is alert and oriented to person, place, and time.  Skin: Skin is warm and dry.  Psychiatric: He has a normal mood and affect. His speech is normal.    ED Course  Procedures (including critical care time)  Labs Reviewed -  No data to display No results found.   1. Nausea & vomiting       MDM   IV fluids are given.  Normal saline 1.5 L are given without problem.   Nausea medicine is given IM. GI cocktail is given by mouth. After this treatment in clinic, the patient is feeling better by about half. He has an appointment with his GI doctor tomorrow. Until then I advised of various liquid diet and bland. After midnight he should likely be n.p.o. if they want to do anything at the clinic. If any bleeding or worsening symptoms he is to go to the emergency room.   Lily Kocher, MD 10/31/11 [phone]

## 2011-10-31 NOTE — ED Notes (Signed)
Patient c/o vomiting x 1 week post endoscopy. He called his GI who reports that vomiting post  Endoscopy is normal. He has an appointment with them on 11/01/11. He has used Zofran and phenergan without relief. Unable to keep food or liquids down.

## 2011-11-06 ENCOUNTER — Encounter: Payer: Self-pay | Admitting: *Deleted

## 2011-11-28 ENCOUNTER — Encounter: Payer: Self-pay | Admitting: Family Medicine

## 2011-11-29 ENCOUNTER — Encounter: Payer: Self-pay | Admitting: Family Medicine

## 2011-12-08 HISTORY — PX: UPPER GI ENDOSCOPY: SHX6162

## 2011-12-21 ENCOUNTER — Encounter: Payer: Self-pay | Admitting: Family Medicine

## 2011-12-21 DIAGNOSIS — K311 Adult hypertrophic pyloric stenosis: Secondary | ICD-10-CM | POA: Insufficient documentation

## 2012-01-17 ENCOUNTER — Encounter: Payer: Self-pay | Admitting: Family Medicine

## 2012-02-13 ENCOUNTER — Ambulatory Visit: Payer: Managed Care, Other (non HMO) | Admitting: Family Medicine

## 2012-02-15 ENCOUNTER — Encounter: Payer: Self-pay | Admitting: Family Medicine

## 2012-03-14 ENCOUNTER — Encounter: Payer: Self-pay | Admitting: *Deleted

## 2012-03-19 ENCOUNTER — Encounter: Payer: Self-pay | Admitting: Family Medicine

## 2012-03-19 ENCOUNTER — Ambulatory Visit (INDEPENDENT_AMBULATORY_CARE_PROVIDER_SITE_OTHER): Payer: Managed Care, Other (non HMO) | Admitting: Family Medicine

## 2012-03-19 VITALS — BP 147/87 | HR 68 | Ht 72.0 in | Wt 201.0 lb

## 2012-03-19 DIAGNOSIS — R030 Elevated blood-pressure reading, without diagnosis of hypertension: Secondary | ICD-10-CM

## 2012-03-19 DIAGNOSIS — E785 Hyperlipidemia, unspecified: Secondary | ICD-10-CM

## 2012-03-19 DIAGNOSIS — R946 Abnormal results of thyroid function studies: Secondary | ICD-10-CM

## 2012-03-19 DIAGNOSIS — R7989 Other specified abnormal findings of blood chemistry: Secondary | ICD-10-CM

## 2012-03-19 NOTE — Progress Notes (Signed)
  Subjective:    Patient ID: Todd Valdez, male    DOB: 1961-09-21, 51 y.o.   MRN: [id number]  HPI  #1elevated BP. This is been going on for while but do want to make sure if this is was not an issue. #2 elevated cholesterol and triglyceride. Since he has not taken his fish oil and not eating solids for several weeks do not think it is prudent at this time to get blood work. #3 intestinal outlet obstruction. He has since had a hospitalization and 4 further endoscopies following initial one that he had. Turns out that endoscopy caused damage to the small intestine which subsequently caused it blockage and a small intestinal obstruction. He says he lost 25 pounds monthly became dehydrated and wound up being admitted at the site once he was rescoped. He has had 3 more endoscopies after that to stretch his intestinal tract. He is now able to eat and keep his food down and has regained his former weight. #4 abnormal TSH. TSH was mildly elevated when he had his yearly exam in December.  Review of Systems  Gastrointestinal: Positive for nausea and vomiting.      BP 147/87  Pulse 68  Ht 6' (1.829 m)  Wt 201 lb (91.173 kg)  BMI 27.26 kg/m2  SpO2 96% Objective:   Physical Exam  Constitutional: He is oriented to person, place, and time. He appears well-developed.  HENT:  Head: Normocephalic.  Neck: Neck supple.  Cardiovascular: Normal rate and regular rhythm.   Pulmonary/Chest: Effort normal and breath sounds normal. No respiratory distress. He has no wheezes.  Musculoskeletal:       Rheumatoid arthritis  Neurological: He is alert and oriented to person, place, and time.  Skin: Skin is warm and dry.  Psychiatric: He has a normal mood and affect. His behavior is normal.      Results for orders placed in visit on 09/14/11  POCT URINALYSIS DIPSTICK      Component Value Range   Color, UA yellow     Clarity, UA clear     Glucose, UA neg     Bilirubin, UA neg     Ketones, UA neg     Spec  Grav, UA 1.025     Blood, UA neg     pH, UA 5.5     Protein, UA neg     Urobilinogen, UA 0.2     Nitrite, UA neg     Leukocytes, UA Negative        Assessment & Plan:  #1 Elevated BP #2 elevated triglycerides #3 abnormal TSH #4 intestinal out let obstruction. This course is being followed by his GI doctor. We are going to have patient check his blood pressure on weekly basis and keep a log of the results. When he returns in 3 months we will know for sure how his blood pressure is doing and whether or not he will need blood pressure medication. Also when he returns he will need a TSH level so we can make sure his thyroid is functioning properly. We will also obtain at his next fasting blood draw lipid panel and CMP. I did recommend he have blood work done about 2-5 days before his visit so that we can go over the results while he is here. Once again fasting 2-5 days before his next visit he needs a CMP, TSH, lipid profile and a CBC.

## 2012-03-19 NOTE — Patient Instructions (Addendum)
We are going to have patient check his blood pressure on weekly basis and keep a log of the results. When he returns in 3 months we will know for sure how his blood pressure is doing and whether or not he will need blood pressure medication. Also when he returns he will need a TSH level so we can make sure his thyroid is functioning properly. We will also obtain at his next fasting blood draw lipid panel and CMP. I did recommend he have blood work done about 2-5 days before his visit so that we can go over the results while he is here.      Hypertriglyceridemia  Diet for High blood levels of Triglycerides Most fats in food are triglycerides. Triglycerides in your blood are stored as fat in your body. High levels of triglycerides in your blood may put you at a greater risk for heart disease and stroke.  Normal triglyceride levels are less than 150 mg/dL. Borderline high levels are 150-199 mg/dl. High levels are 200 - 499 mg/dL, and very high triglyceride levels are greater than 500 mg/dL. The decision to treat high triglycerides is generally based on the level. For people with borderline or high triglyceride levels, treatment includes weight loss and exercise. Drugs are recommended for people with very high triglyceride levels. Many people who need treatment for high triglyceride levels have metabolic syndrome. This syndrome is a collection of disorders that often include: insulin resistance, high blood pressure, blood clotting problems, high cholesterol and triglycerides. TESTING PROCEDURE FOR TRIGLYCERIDES  You should not eat 4 hours before getting your triglycerides measured. The normal range of triglycerides is between 10 and 250 milligrams per deciliter (mg/dl). Some people may have extreme levels (1000 or above), but your triglyceride level may be too high if it is above 150 mg/dl, depending on what other risk factors you have for heart disease.   People with high blood triglycerides may also  have high blood cholesterol levels. If you have high blood cholesterol as well as high blood triglycerides, your risk for heart disease is probably greater than if you only had high triglycerides. High blood cholesterol is one of the main risk factors for heart disease.  CHANGING YOUR DIET  Your weight can affect your blood triglyceride level. If you are more than 20% above your ideal body weight, you may be able to lower your blood triglycerides by losing weight. Eating less and exercising regularly is the best way to combat this. Fat provides more calories than any other food. The best way to lose weight is to eat less fat. Only 30% of your total calories should come from fat. Less than 7% of your diet should come from saturated fat. A diet low in fat and saturated fat is the same as a diet to decrease blood cholesterol. By eating a diet lower in fat, you may lose weight, lower your blood cholesterol, and lower your blood triglyceride level.  Eating a diet low in fat, especially saturated fat, may also help you lower your blood triglyceride level. Ask your dietitian to help you figure how much fat you can eat based on the number of calories your caregiver has prescribed for you.  Exercise, in addition to helping with weight loss may also help lower triglyceride levels.   Alcohol can increase blood triglycerides. You may need to stop drinking alcoholic beverages.   Too much carbohydrate in your diet may also increase your blood triglycerides. Some complex carbohydrates are necessary in your  diet. These may include bread, rice, potatoes, other starchy vegetables and cereals.   Reduce "simple" carbohydrates. These may include pure sugars, candy, honey, and jelly without losing other nutrients. If you have the kind of high blood triglycerides that is affected by the amount of carbohydrates in your diet, you will need to eat less sugar and less high-sugar foods. Your caregiver can help you with this.    Adding 2-4 grams of fish oil (EPA+ DHA) may also help lower triglycerides. Speak with your caregiver before adding any supplements to your regimen.  Following the Diet  Maintain your ideal weight. Your caregivers can help you with a diet. Generally, eating less food and getting more exercise will help you lose weight. Joining a weight control group may also help. Ask your caregivers for a good weight control group in your area.  Eat low-fat foods instead of high-fat foods. This can help you lose weight too.  These foods are lower in fat. Eat MORE of these:   Dried beans, peas, and lentils.   Egg whites.   Low-fat cottage cheese.   Fish.   Lean cuts of meat, such as round, sirloin, rump, and flank (cut extra fat off meat you fix).   Whole grain breads, cereals and pasta.   Skim and nonfat dry milk.   Low-fat yogurt.   Poultry without the skin.   Cheese made with skim or part-skim milk, such as mozzarella, parmesan, farmers', ricotta, or pot cheese.  These are higher fat foods. Eat LESS of these:   Whole milk and foods made from whole milk, such as American, blue, cheddar, monterey jack, and swiss cheese   High-fat meats, such as luncheon meats, sausages, knockwurst, bratwurst, hot dogs, ribs, corned beef, ground pork, and regular ground beef.   Fried foods.  Limit saturated fats in your diet. Substituting unsaturated fat for saturated fat may decrease your blood triglyceride level. You will need to read package labels to know which products contain saturated fats.  These foods are high in saturated fat. Eat LESS of these:   Fried pork skins.   Whole milk.   Skin and fat from poultry.   Palm oil.   Butter.   Shortening.   Cream cheese.   Todd Valdez.   Margarines and baked goods made from listed oils.   Vegetable shortenings.   Chitterlings.   Fat from meats.   Coconut oil.   Palm kernel oil.   Lard.   Cream.   Sour cream.   Fatback.   Coffee  whiteners and non-dairy creamers made with these oils.   Cheese made from whole milk.  Use unsaturated fats (both polyunsaturated and monounsaturated) moderately. Remember, even though unsaturated fats are better than saturated fats; you still want a diet low in total fat.  These foods are high in unsaturated fat:   Canola oil.   Sunflower oil.   Mayonnaise.   Almonds.   Peanuts.   Pine nuts.   Margarines made with these oils.   Safflower oil.   Olive oil.   Avocados.   Cashews.   Peanut butter.   Sunflower seeds.   Soybean oil.   Peanut oil.   Olives.   Pecans.   Walnuts.   Pumpkin seeds.  Avoid sugar and other high-sugar foods. This will decrease carbohydrates without decreasing other nutrients. Sugar in your food goes rapidly to your blood. When there is excess sugar in your blood, your liver may use it to make more triglycerides. Sugar also  contains calories without other important nutrients.  Eat LESS of these:   Sugar, brown sugar, powdered sugar, jam, jelly, preserves, honey, syrup, molasses, pies, candy, cakes, cookies, frosting, pastries, colas, soft drinks, punches, fruit drinks, and regular gelatin.   Avoid alcohol. Alcohol, even more than sugar, may increase blood triglycerides. In addition, alcohol is high in calories and low in nutrients. Ask for sparkling water, or a diet soft drink instead of an alcoholic beverage.  Suggestions for planning and preparing meals   Bake, broil, grill or roast meats instead of frying.   Remove fat from meats and skin from poultry before cooking.   Add spices, herbs, lemon juice or vinegar to vegetables instead of salt, rich sauces or gravies.   Use a non-stick skillet without fat or use no-stick sprays.   Cool and refrigerate stews and broth. Then remove the hardened fat floating on the surface before serving.   Refrigerate meat drippings and skim off fat to make low-fat gravies.   Serve more fish.   Use  less butter, margarine and other high-fat spreads on bread or vegetables.   Use skim or reconstituted non-fat dry milk for cooking.   Cook with low-fat cheeses.   Substitute low-fat yogurt or cottage cheese for all or part of the sour cream in recipes for sauces, dips or congealed salads.   Use half yogurt/half mayonnaise in salad recipes.   Substitute evaporated skim milk for cream. Evaporated skim milk or reconstituted non-fat dry milk can be whipped and substituted for whipped cream in certain recipes.   Choose fresh fruits for dessert instead of high-fat foods such as pies or cakes. Fruits are naturally low in fat.  When Dining Out   Order low-fat appetizers such as fruit or vegetable juice, pasta with vegetables or tomato sauce.   Select clear, rather than cream soups.   Ask that dressings and gravies be served on the side. Then use less of them.   Order foods that are baked, broiled, poached, steamed, stir-fried, or roasted.   Ask for margarine instead of butter, and use only a small amount.   Drink sparkling water, unsweetened tea or coffee, or diet soft drinks instead of alcohol or other sweet beverages.  QUESTIONS AND ANSWERS ABOUT OTHER FATS IN THE BLOOD: SATURATED FAT, TRANS FAT, AND CHOLESTEROL What is trans fat? Trans fat is a type of fat that is formed when vegetable oil is hardened through a process called hydrogenation. This process helps makes foods more solid, gives them shape, and prolongs their shelf life. Trans fats are also called hydrogenated or partially hydrogenated oils.  What do saturated fat, trans fat, and cholesterol in foods have to do with heart disease? Saturated fat, trans fat, and cholesterol in the diet all raise the level of LDL "bad" cholesterol in the blood. The higher the LDL cholesterol, the greater the risk for coronary heart disease (CHD). Saturated fat and trans fat raise LDL similarly.  What foods contain saturated fat, trans fat, and  cholesterol? High amounts of saturated fat are found in animal products, such as fatty cuts of meat, chicken skin, and full-fat dairy products like butter, whole milk, cream, and cheese, and in tropical vegetable oils such as palm, palm kernel, and coconut oil. Trans fat is found in some of the same foods as saturated fat, such as vegetable shortening, some margarines (especially hard or stick margarine), crackers, cookies, baked goods, fried foods, salad dressings, and other processed foods made with partially hydrogenated vegetable oils.  Small amounts of trans fat also occur naturally in some animal products, such as milk products, beef, and lamb. Foods high in cholesterol include liver, other organ meats, egg yolks, shrimp, and full-fat dairy products. How can I use the new food label to make heart-healthy food choices? Check the Nutrition Facts panel of the food label. Choose foods lower in saturated fat, trans fat, and cholesterol. For saturated fat and cholesterol, you can also use the Percent Daily Value (%DV): 5% DV or less is low, and 20% DV or more is high. (There is no %DV for trans fat.) Use the Nutrition Facts panel to choose foods low in saturated fat and cholesterol, and if the trans fat is not listed, read the ingredients and limit products that list shortening or hydrogenated or partially hydrogenated vegetable oil, which tend to be high in trans fat. POINTS TO REMEMBER: YOU NEED A LITTLE TLC (THERAPEUTIC LIFESTYLE CHANGES)  Discuss your risk for heart disease with your caregivers, and take steps to reduce risk factors.   Change your diet. Choose foods that are low in saturated fat, trans fat, and cholesterol.   Add exercise to your daily routine if it is not already being done. Participate in physical activity of moderate intensity, like brisk walking, for at least 30 minutes on most, and preferably all days of the week. No time? Break the 30 minutes into three, 10-minute segments during  the day.   Stop smoking. If you do smoke, contact your caregiver to discuss ways in which they can help you quit.   Do not use street drugs.   Maintain a normal weight.   Maintain a healthy blood pressure.   Keep up with your blood work for checking the fats in your blood as directed by your caregiver.  Document Released: 07/13/2004 Document Revised: 09/14/2011 Document Reviewed: 02/08/2009 [employer] Patient Information 2012 [place], [place].

## 2012-06-20 ENCOUNTER — Other Ambulatory Visit: Payer: Self-pay | Admitting: *Deleted

## 2012-06-20 MED ORDER — ESOMEPRAZOLE MAGNESIUM 40 MG PO CPDR: 40.0000 mg | DELAYED_RELEASE_CAPSULE | Freq: Every day | ORAL | Status: DC

## 2012-07-10 ENCOUNTER — Telehealth: Payer: Self-pay | Admitting: *Deleted

## 2012-07-10 DIAGNOSIS — R7989 Other specified abnormal findings of blood chemistry: Secondary | ICD-10-CM

## 2012-07-10 DIAGNOSIS — E785 Hyperlipidemia, unspecified: Secondary | ICD-10-CM

## 2012-07-10 NOTE — Telephone Encounter (Signed)
Wife would like labs entered so she can draw and take to lab. Will do today!

## 2012-07-16 ENCOUNTER — Other Ambulatory Visit: Payer: Self-pay | Admitting: Family Medicine

## 2012-07-17 LAB — COMPLETE METABOLIC PANEL WITH GFR
AST: 15 U/L (ref 0–37)
BUN: 15 mg/dL (ref 6–23)
Calcium: 9.2 mg/dL (ref 8.4–10.5)
Chloride: 105 mEq/L (ref 96–112)
Creat: 0.69 mg/dL (ref 0.50–1.35)
GFR, Est African American: >89 mL/min
GFR, Est Non African American: >89 mL/min
Glucose, Bld: 86 mg/dL (ref 70–99)

## 2012-07-17 LAB — CBC WITH DIFFERENTIAL/PLATELET
Basophils Absolute: 0 10*3/uL (ref 0.0–0.1)
Lymphocytes Relative: 22 % (ref 12–46)
Lymphs Abs: 1.8 10*3/uL (ref 0.7–4.0)
MCV: 92.4 fL (ref 78.0–100.0)
Neutro Abs: 5.4 10*3/uL (ref 1.7–7.7)
Neutrophils Relative %: 69 % (ref 43–77)
Platelets: 292 10*3/uL (ref 150–400)
RBC: 5.12 MIL/uL (ref 4.22–5.81)
WBC: 7.9 10*3/uL (ref 4.0–10.5)

## 2012-07-17 LAB — LIPID PANEL
Cholesterol: 211 mg/dL — ABNORMAL HIGH (ref 0–200)
HDL: 26 mg/dL — ABNORMAL LOW (ref >39)
Total CHOL/HDL Ratio: 8.1 Ratio
Triglycerides: 661 mg/dL — ABNORMAL HIGH (ref <150)

## 2012-07-25 ENCOUNTER — Ambulatory Visit (INDEPENDENT_AMBULATORY_CARE_PROVIDER_SITE_OTHER): Payer: Managed Care, Other (non HMO) | Admitting: Family Medicine

## 2012-07-25 ENCOUNTER — Encounter: Payer: Self-pay | Admitting: Family Medicine

## 2012-07-25 VITALS — BP 139/94 | HR 72 | Ht 72.0 in | Wt 203.0 lb

## 2012-07-25 DIAGNOSIS — Z23 Encounter for immunization: Secondary | ICD-10-CM

## 2012-07-25 DIAGNOSIS — R7309 Other abnormal glucose: Secondary | ICD-10-CM

## 2012-07-25 DIAGNOSIS — E785 Hyperlipidemia, unspecified: Secondary | ICD-10-CM

## 2012-07-25 DIAGNOSIS — K56609 Unspecified intestinal obstruction, unspecified as to partial versus complete obstruction: Secondary | ICD-10-CM

## 2012-07-25 DIAGNOSIS — I10 Essential (primary) hypertension: Secondary | ICD-10-CM

## 2012-07-25 MED ORDER — TELMISARTAN 40 MG PO TABS: 40.0000 mg | ORAL_TABLET | Freq: Every day | ORAL | Status: DC

## 2012-07-25 MED ORDER — OMEGA-3-ACID ETHYL ESTERS 1 G PO CAPS: 2.0000 g | ORAL_CAPSULE | Freq: Two times a day (BID) | ORAL | Status: DC

## 2012-07-25 NOTE — Patient Instructions (Signed)
Metabolic Syndrome, Adult Metabolic syndrome descibes a group of risk factors for heart disease and diabetes. This syndrome has other names including Insulin Resistance Syndrome. The more risk factors you have, the higher your risk of having a heart attack, stroke, or developing diabetes. These risk factors include:  High blood sugar.  High blood triglyceride (a fat found in the blood) level.  High blood pressure.  Abdominal obesity (your extra weight is around your waist instead of your hips).  Low levels of high-density lipoprotein, HDL (good blood cholesterol). If you have any three of these risk factors, you have metabolic syndrome. If you have even one of these factors, you should make lifestyle changes to improve your health in order to prevent serious health diseases.  In people with metabolic syndrome, the cells do not respond properly to insulin. This can lead to high levels of glucose in the blood, which can interfere with normal body processes. Eventually, this can cause high blood pressure and higher fat levels in the blood, and inflammation of your blood vessels. The result can be heart disease and stroke.  CAUSES   Eating a diet rich in calories and saturated fat.  Too little physical activity.  Being overweight. Other underlying causes are:  Family history (genetics).  Ethnicity (South Asians are at a higher risk).  Older age (your chances of developing metabolic syndrome are higher as you grow older).  Insulin resistance. SYMPTOMS  By itself, metabolic syndrome has no symptoms. However, you might have symptoms of diabetes (high blood sugar) or high blood pressure, such as:  Increased thirst, urination, and tiredness.  Dizzy spells.  Dull headaches that are unusual for you.  Blurred vision.  Nosebleeds. DIAGNOSIS  Your caregiver may make a diagnosis of metabolic syndrome if you have at least three of these factors:  If you are overweight mostly around the  waist. This means a waistline greater than 40" in men and more than 35" in women. The waistline limits are 31 to 35 inches for women and 37 to 39 inches for men. In those who have certain genetic risk factors, such as having a family history of diabetes or being of Asian descent.  If you have a blood pressure of 130/85 mm Hg or more, or if you are being treated for high blood pressure.  If your blood triglyceride level is 150 mg/dL or more, or you are being treated for high levels of triglyceride.  If the level of HDL in your blood is below 40 mg/dL in men, less than 50 mg/dL in women, or you are receiving treatment for low levels of HDL.  If the level of sugar in your blood is high with fasting blood sugar level of 110 mg/dL or more, or you are under treatment for diabetes. TREATMENT  Your caregiver may have you make lifestyle changes, which may include:  Exercise.  Losing weight.  Maintaining a healthy diet.  Quitting smoking. The lifestyle changes listed above are key in reducing your risk for heart disease and stroke. Medicines may also be prescribed to help your body respond to insulin better and to reduce your blood pressure and blood fat levels. Aspirin may be recommended to reduce risks of heart disease or stroke.  HOME CARE INSTRUCTIONS   Exercise.  Measure your waist at regular intervals just above the hipbones after you have breathed out.  Maintain a healthy diet.  Eat fruits, such as apples, oranges, and pears.  Eat vegetables.  Eat legumes, such as kidney   beans, peas, and lentils.  Eat food rich in soluble fiber, such as whole grain cereal, oatmeal, and oat bran.  Use olive or safflower oils and avoid saturated fats.  Eat nuts.  Limit the amount of salt you eat or add to food.  Limit the amount of alcohol you drink.  Include fish in your diet, if possible.  Stop smoking if you are a smoker.  Maintain regular follow-up appointments.  Follow your  caregiver's advice. SEEK MEDICAL CARE IF:   You feel very tired or fatigued.  You develop excessive thirst.  You pass large quantities of urine.  You are putting on weight around your waist rather than losing weight.  You develop headaches over and over again.  You have off-and-on dizzy spells. SEEK IMMEDIATE MEDICAL CARE IF:   You develop nosebleeds.  You develop sudden blurred vision.  You develop sudden dizzy spells.  You develop chest pains, trouble breathing, or feel an abnormal or irregular heart beat.  You have a fainting episode.  You develop any sudden trouble speaking and/or swallowing.  You develop sudden weakness in one arm and/or one leg. MAKE SURE YOU:   Understand these instructions.  Will watch your condition.  Will get help right away if you are not doing well or get worse. Document Released: 01/02/2008 Document Revised: 12/18/2011 Document Reviewed: 01/02/2008 [facility] Patient Information 2013 [place], [place]. Triglycerides, TG, TRIG This is a test to check your risk of developing heart disease. It is often done as part of a lipid profile during a regular medical exam or if you are being treated for high triglycerides. This test measures the amount of triglycerides in your blood. Triglycerides are the body's storage form for fat. Most triglycerides are found in fat tissue. Some triglycerides circulate in the blood to provide fuel for muscles to work. Extra triglycerides are found in the blood after eating a meal when fat is being sent from the gut to fat tissue for storage. The test for triglycerides should be done when you are fasting and no extra triglycerides from a recent meal are present.  SAMPLE COLLECTION The test for triglycerides uses a blood sample. Most often, the blood sample is collected using a needle to collect blood from a vein. Sometimes triglycerides are measured using a drop of blood collected by puncturing the skin on a finger. Testing  should be done when you are fasting. For 12 to 14 hours before the test, only water is permitted. In addition, alcohol should not be consumed for the 24 hours just before the test. If you are diabetic and your blood sugar is out of control, triglycerides will be very high. NORMAL FINDINGS  Adult/elderly  Male: 40-160 mg/dL or 7.84-6.96 mmol/L (SI units)  Male: 35-135 mg/dL or 2.95-2.84 mmol/L (SI units)  0-53 years  Male: 30-86 mg/dL  Male: 13-24 mg/dL  4-53 years  Male: 02-725 mg/dL  Male: 36-644 mg/dL  03-53 years  Male: 42-595 mg/dL  Male: 63-875 mg/dL  64-53 years  Male: 29-518 mg/dL  Male: 84-166 mg/dL Ranges for normal findings may vary among different laboratories and hospitals. You should always check with your doctor after having lab work or other tests done to discuss the meaning of your test results and whether your values are considered within normal limits. MEANING OF TEST  Your caregiver will go over the test results with you and discuss the importance and meaning of your results, as well as treatment options and the need for additional tests if  necessary. OBTAINING THE TEST RESULTS It is your responsibility to obtain your test results. Ask the lab or department performing the test when and how you will get your results. Document Released: 10/28/2004 Document Revised: 12/18/2011 Document Reviewed: 09/06/2008 [facility] Patient Information 2013 [place], [place]. Hypertension As your heart beats, it forces blood through your arteries. This force is your blood pressure. If the pressure is too high, it is called hypertension (HTN) or high blood pressure. HTN is dangerous because you may have it and not know it. High blood pressure may mean that your heart has to work harder to pump blood. Your arteries may be narrow or stiff. The extra work puts you at risk for heart disease, stroke, and other problems.  Blood pressure consists of two numbers, a higher number  over a lower, 110/72, for example. It is stated as "110 over 72." The ideal is below 120 for the top number (systolic) and under 80 for the bottom (diastolic). Write down your blood pressure today. You should pay close attention to your blood pressure if you have certain conditions such as:  Heart failure.  Prior heart attack.  Diabetes  Chronic kidney disease.  Prior stroke.  Multiple risk factors for heart disease. To see if you have HTN, your blood pressure should be measured while you are seated with your arm held at the level of the heart. It should be measured at least twice. A one-time elevated blood pressure reading (especially in the Emergency Department) does not mean that you need treatment. There may be conditions in which the blood pressure is different between your right and left arms. It is important to see your caregiver soon for a recheck. Most people have essential hypertension which means that there is not a specific cause. This type of high blood pressure may be lowered by changing lifestyle factors such as:  Stress.  Smoking.  Lack of exercise.  Excessive weight.  Drug/tobacco/alcohol use.  Eating less salt. Most people do not have symptoms from high blood pressure until it has caused damage to the body. Effective treatment can often prevent, delay or reduce that damage. TREATMENT  When a cause has been identified, treatment for high blood pressure is directed at the cause. There are a large number of medications to treat HTN. These fall into several categories, and your caregiver will help you select the medicines that are best for you. Medications may have side effects. You should review side effects with your caregiver. If your blood pressure stays high after you have made lifestyle changes or started on medicines,   Your medication(s) may need to be changed.  Other problems may need to be addressed.  Be certain you understand your prescriptions, and know  how and when to take your medicine.  Be sure to follow up with your caregiver within the time frame advised (usually within two weeks) to have your blood pressure rechecked and to review your medications.  If you are taking more than one medicine to lower your blood pressure, make sure you know how and at what times they should be taken. Taking two medicines at the same time can result in blood pressure that is too low. SEEK IMMEDIATE MEDICAL CARE IF:  You develop a severe headache, blurred or changing vision, or confusion.  You have unusual weakness or numbness, or a faint feeling.  You have severe chest or abdominal pain, vomiting, or breathing problems. MAKE SURE YOU:   Understand these instructions.  Will watch your condition.  Will get help right away if you are not doing well or get worse. Document Released: 09/25/2005 Document Revised: 12/18/2011 Document Reviewed: 05/15/2008 [facility] Patient Information 2013 [place], [place].

## 2012-07-25 NOTE — Progress Notes (Signed)
Subjective:    Patient ID: Todd Valdez, male    DOB: 1961-06-13, 51 y.o.   MRN: [id number]  Hypertension This is a new problem. The current episode started more than 1 year ago. The problem has been gradually worsening since onset. The problem is uncontrolled. Associated symptoms include headaches. Pertinent negatives include no anxiety, blurred vision, chest pain, malaise/fatigue, neck pain, orthopnea, palpitations, peripheral edema, PND, shortness of breath or sweats. There are no associated agents to hypertension. Risk factors for coronary artery disease include dyslipidemia and male gender. Past treatments include nothing. There are no compliance problems.  There is no history of angina, kidney disease, CAD/MI, CVA, heart failure, left ventricular hypertrophy, PVD, renovascular disease, retinopathy or a thyroid problem. There is no history of chronic renal disease, coarctation of the aorta, hyperaldosteronism, hypercortisolism, hyperparathyroidism, a hypertension causing med, pheochromocytoma or sleep apnea.   #2 intestinal obstruction. He is following up with the GI doctor. Appetite has improved weight has improved and he is eating okay.   #3 he reports having blood work done at work finding of his triglycerides elevated. He had just started on the prescription fish all when he had complications from endoscopy and developed intestinal obstruction he has never restarted that medication and feels that he needs something now.  #4 immunization needed flu shot.    #5 needs to be evaluated for metabolic syndrome.    Review of Systems  Constitutional: Positive for appetite change. Negative for malaise/fatigue, fatigue and unexpected weight change.  HENT: Negative for neck pain.   Eyes: Negative for blurred vision.  Respiratory: Negative for shortness of breath.   Cardiovascular: Negative for chest pain, palpitations, orthopnea and PND.  Gastrointestinal: Negative for nausea, vomiting and diarrhea.   Musculoskeletal: Positive for joint swelling and arthralgias.  Neurological: Positive for headaches.  All other systems reviewed and are negative.       Objective:   Physical Exam  Constitutional: He is oriented to person, place, and time. He appears well-developed and well-nourished.  HENT:  Head: Normocephalic.  Neck: Neck supple.  Cardiovascular: Normal rate, regular rhythm and normal heart sounds.   Pulmonary/Chest: Effort normal.  Abdominal: Soft.  Musculoskeletal: Normal range of motion.  Neurological: He is alert and oriented to person, place, and time.  Skin: Skin is warm and dry.  Psychiatric: He has a normal mood and affect.      Results for orders placed in visit on 07/16/12  COMPLETE METABOLIC PANEL WITH GFR      Component Value Range   Sodium 139  135 - 145 mEq/L   Potassium 4.6  3.5 - 5.3 mEq/L   Chloride 105  96 - 112 mEq/L   CO2 24  19 - 32 mEq/L   Glucose, Bld 86  70 - 99 mg/dL   BUN 15  6 - 23 mg/dL   Creat 1.19  1.47 - 8.29 mg/dL   Total Bilirubin 0.5  0.3 - 1.2 mg/dL   Alkaline Phosphatase 91  39 - 117 U/L   AST 15  0 - 37 U/L   ALT 10  0 - 53 U/L   Total Protein 7.0  6.0 - 8.3 g/dL   Albumin 4.1  3.5 - 5.2 g/dL   Calcium 9.2  8.4 - 56.2 mg/dL   GFR, Est African American >89     GFR, Est Non African American >89    CBC WITH DIFFERENTIAL      Component Value Range   WBC 7.9  4.0 -  10.5 K/uL   RBC 5.12  4.22 - 5.81 MIL/uL   Hemoglobin 15.2  13.0 - 17.0 g/dL   HCT 16.1  09.6 - 04.5 %   MCV 92.4  78.0 - 100.0 fL   MCH 29.7  26.0 - 34.0 pg   MCHC 32.1  30.0 - 36.0 g/dL   RDW 40.9  81.1 - 91.4 %   Platelets 292  150 - 400 K/uL   Neutrophils Relative 69  43 - 77 %   Neutro Abs 5.4  1.7 - 7.7 K/uL   Lymphocytes Relative 22  12 - 46 %   Lymphs Abs 1.8  0.7 - 4.0 K/uL   Monocytes Relative 5  3 - 12 %   Monocytes Absolute 0.4  0.1 - 1.0 K/uL   Eosinophils Relative 4  0 - 5 %   Eosinophils Absolute 0.3  0.0 - 0.7 K/uL   Basophils Relative 0  0 - 1  %   Basophils Absolute 0.0  0.0 - 0.1 K/uL   Smear Review Criteria for review not met    LIPID PANEL      Component Value Range   Cholesterol 211 (*) 0 - 200 mg/dL   Triglycerides 782 (*) <150 mg/dL   HDL 26 (*) >95 mg/dL   Total CHOL/HDL Ratio 8.1     VLDL NOT CALC  0 - 40 mg/dL   LDL Cholesterol      TSH      Component Value Range   TSH 4.291  0.350 - 4.500 uIU/mL     Assessment & Plan:  #1 hyperlipidemia. Obviously with the triglycerides 661 and cholesterol of 211 and HDL of 26 this will need to be treated. He used the prescription fish oil Lovaza before he had the obstruction and had no problems with it. Samples will be given to him and we will try to get this to his mail order pharmacy.  #2 intestinal obstruction. He thought the GI doctor in a few months for over his final release. This point time things. In a good control. #3 elevated blood pressure/hypertension with his blood pressure fluctuating back and forth I think is time for him to be on blood pressure medication we'll place him on Micardis 40 mg one capsule a day.  #4 immunization due. Flu vaccination to be given today.  #5 evaluation for metabolic syndrome. A1c was negative or rather 5.2 would recommend yearly A1c's.  Return in 3 months for followup special blood pressure medication followup.

## 2012-07-30 DIAGNOSIS — Z23 Encounter for immunization: Secondary | ICD-10-CM

## 2012-07-30 LAB — POCT GLYCOSYLATED HEMOGLOBIN (HGB A1C): Hemoglobin A1C: 5.2

## 2012-08-09 ENCOUNTER — Emergency Department (INDEPENDENT_AMBULATORY_CARE_PROVIDER_SITE_OTHER)
Admission: EM | Admit: 2012-08-09 | Discharge: 2012-08-09 | Disposition: A | Discharge status: Home / Self Care | Payer: Managed Care, Other (non HMO) | Source: Home / Self Care

## 2012-08-09 ENCOUNTER — Encounter: Payer: Self-pay | Admitting: *Deleted

## 2012-08-09 ENCOUNTER — Emergency Department (INDEPENDENT_AMBULATORY_CARE_PROVIDER_SITE_OTHER): Payer: Managed Care, Other (non HMO)

## 2012-08-09 DIAGNOSIS — R062 Wheezing: Secondary | ICD-10-CM

## 2012-08-09 DIAGNOSIS — D899 Disorder involving the immune mechanism, unspecified: Secondary | ICD-10-CM

## 2012-08-09 DIAGNOSIS — J069 Acute upper respiratory infection, unspecified: Secondary | ICD-10-CM

## 2012-08-09 DIAGNOSIS — R05 Cough: Secondary | ICD-10-CM

## 2012-08-09 DIAGNOSIS — J9819 Other pulmonary collapse: Secondary | ICD-10-CM

## 2012-08-09 HISTORY — DX: Pure hyperglyceridemia: E78.1

## 2012-08-09 LAB — POCT RAPID STREP A (OFFICE): Rapid Strep A Screen: NEGATIVE

## 2012-08-09 MED ORDER — AZITHROMYCIN 250 MG PO TABS: ORAL_TABLET | ORAL | Status: DC

## 2012-08-09 MED ORDER — HYDROCOD POLST-CHLORPHEN POLST 10-8 MG/5ML PO LQCR: 5.0000 mL | Freq: Two times a day (BID) | ORAL | Status: DC | PRN

## 2012-08-09 MED ORDER — METHYLPREDNISOLONE ACETATE 80 MG/ML IJ SUSP
80.0000 mg | Freq: Once | INTRAMUSCULAR | Status: AC
  Administered 2012-08-09: 80 mg via INTRAMUSCULAR

## 2012-08-09 NOTE — ED Provider Notes (Signed)
History     CSN: [id number]  Arrival date & time 08/09/12  1556   First MD Initiated Contact with Patient 08/09/12 1603      Chief Complaint  Patient presents with  . Sore Throat  . Otalgia  . Laryngitis    HPI URI Symptoms Onset: 3 days Description: rhinorrhea, nasal congestion, sinus drainage, otalgia, cough  Modifying factors:  Baseline RA on MTX and enbrel   Symptoms Nasal discharge: yes Fever: mild Sore throat: yes Cough: yes Wheezing: mild Ear pain: yes GI symptoms: no Sick contacts: yes  Red Flags  Stiff neck: no Dyspnea: mild Rash: no Swallowing difficulty: no  Sinusitis Risk Factors Headache/face pain: no Double sickening: no tooth pain: no  Allergy Risk Factors Sneezing: no Itchy scratchy throat: no Seasonal symptoms: no  Flu Risk Factors Headache: no muscle aches: no severe fatigue: mild    Past Medical History  Diagnosis Date  . Arthritis   . Reflux esophagitis   . GERD (gastroesophageal reflux disease)   . Diverticulitis   . High triglycerides     Past Surgical History  Procedure Date  . Feet surgery   . Bilateral feet reconstruction 10/2000    Family History  Problem Relation Age of Onset  . Hypertension Mother   . Hypertension Father     History  Substance Use Topics  . Smoking status: Former Smoker    Quit date: 02/07/1991  . Smokeless tobacco: Former Neurosurgeon    Quit date: 08/14/2009  . Alcohol Use: No      Review of Systems  All other systems reviewed and are negative.    Allergies  Review of patient's allergies indicates no known allergies.  Home Medications   Current Outpatient Rx  Name Route Sig Dispense Refill  . ESOMEPRAZOLE MAGNESIUM 40 MG PO CPDR Oral Take 1 capsule (40 mg total) by mouth daily before breakfast. 30 capsule 11  . ETANERCEPT 50 MG/ML Snyder SOLN Subcutaneous Inject 50 mg into the skin once a week.    . METHOTREXATE 2.5 MG PO TABS Oral Take 2.5 mg by mouth once a week.  Caution:Chemotherapy. Protect from light.     . OMEGA-3-ACID ETHYL ESTERS 1 G PO CAPS Oral Take 2 capsules (2 g total) by mouth 2 (two) times daily. 360 capsule 3  . TELMISARTAN 40 MG PO TABS Oral Take 1 tablet (40 mg total) by mouth daily. 90 tablet 3    BP 137/89  Pulse 76  Temp 97.6 F (36.4 C) (Oral)  Resp 18  Ht 5\' 9"  (1.753 m)  Wt 201 lb (91.173 kg)  BMI 29.68 kg/m2  SpO2 97%  Physical Exam  Constitutional: He is oriented to person, place, and time. He appears well-developed and well-nourished.  HENT:  Head: Normocephalic and atraumatic.  Right Ear: External ear normal.  Left Ear: External ear normal.       +nasal erythema, rhinorrhea bilaterally, + post oropharyngeal erythema    Eyes: Conjunctivae normal are normal. Pupils are equal, round, and reactive to light.  Neck: Normal range of motion. Neck supple.  Cardiovascular: Normal rate and regular rhythm.   Pulmonary/Chest: Effort normal.       + rales in bases bilaterally   Abdominal: Soft.  Musculoskeletal: Normal range of motion.  Neurological: He is alert and oriented to person, place, and time.  Skin: Skin is warm.    ED Course  Procedures (including critical care time)   Labs Reviewed  POCT RAPID STREP A (OFFICE)  POCT  INFLUENZA A/B   Dg Chest 2 View  08/09/2012  *RADIOLOGY REPORT*  Clinical Data: Cough and dyspnea.  Immunosuppressed.  CHEST - 2 VIEW  Comparison: None  Findings: Normal heart size.  No pleural effusion or edema.  Plate- like atelectasis noted in the left base.  No airspace consolidation.  IMPRESSION:  1.  Subsegmental atelectasis in the left base. 2.  No pneumonia.   Original Report Authenticated By: Signa Kell, M.D.      1. URI (upper respiratory infection)   2. Cough   3. Wheezing       MDM  Suspect underlying viral process.  Rapid flu, rapid strep negative.  CXR negative for focal infiltrate.  Depomedrol 80mg  IM x1 for wheezing component.  zpak for atypical and upper resp  coverage.  Discussed resp and infectious red flags.  Follow up as needed.     The patient and/or caregiver has been counseled thoroughly with regard to treatment plan and/or medications prescribed including dosage, schedule, interactions, rationale for use, and possible side effects and they verbalize understanding. Diagnoses and expected course of recovery discussed and will return if not improved as expected or if the condition worsens. Patient and/or caregiver verbalized understanding.              Doree Albee, MD 08/09/12 [phone]

## 2012-08-09 NOTE — ED Notes (Signed)
Pt c/o sore throat, laryngitis and bilateral ear ache x 3 days. Denies fever. He has taken dayquil and nyquil.

## 2012-08-12 ENCOUNTER — Telehealth: Payer: Self-pay

## 2012-08-12 NOTE — ED Notes (Signed)
Left a message on voice mail asking how patient is feeling and advising to call back with any questions or concerns.  

## 2012-10-03 ENCOUNTER — Encounter: Payer: Self-pay | Admitting: *Deleted

## 2012-10-03 ENCOUNTER — Emergency Department (INDEPENDENT_AMBULATORY_CARE_PROVIDER_SITE_OTHER)
Admission: EM | Admit: 2012-10-03 | Discharge: 2012-10-03 | Disposition: A | Discharge status: Home / Self Care | Payer: Managed Care, Other (non HMO) | Source: Home / Self Care | Attending: Family Medicine | Admitting: Family Medicine

## 2012-10-03 DIAGNOSIS — H5789 Other specified disorders of eye and adnexa: Secondary | ICD-10-CM

## 2012-10-03 LAB — CBC WITH DIFFERENTIAL/PLATELET
Eosinophils Relative: 2 % (ref 0–5)
Lymphocytes Relative: 20 % (ref 12–46)
Lymphs Abs: 1.5 10*3/uL (ref 0.7–4.0)
MCV: 86.8 fL (ref 78.0–100.0)
Neutrophils Relative %: 70 % (ref 43–77)
Platelets: 284 10*3/uL (ref 150–400)
RBC: 5.01 MIL/uL (ref 4.22–5.81)
WBC: 7.5 10*3/uL (ref 4.0–10.5)

## 2012-10-03 NOTE — ED Provider Notes (Signed)
History     CSN: [id number]  Arrival date & time 10/03/12  4098   First MD Initiated Contact with Patient 10/03/12 [phone]      Chief Complaint  Patient presents with  . Eye Drainage  . Eye Pain   HPI  Eye pain x 1 day. Pt woke up this am with persistent L eye pain and irritation as well as drainage. Has sensation of foreign body but is not sure. Wife is a Engineer, civil (consulting). Tried aggressive eye wash at home, but was unsuccessful in relieving pain. No LOV. Has had mild HA.  Baseline hx/o chronic dry eye. On restasis.  Also with severe RA. Currently taking MTX and enbrel for management.  No recent changes in medication.  No fevers or chills. No recent infections.   Past Medical History  Diagnosis Date  . Arthritis   . Reflux esophagitis   . GERD (gastroesophageal reflux disease)   . Diverticulitis   . High triglycerides     Past Surgical History  Procedure Date  . Feet surgery   . Bilateral feet reconstruction 10/2000    Family History  Problem Relation Age of Onset  . Hypertension Mother   . Hypertension Father     History  Substance Use Topics  . Smoking status: Former Smoker    Quit date: 02/07/1991  . Smokeless tobacco: Former Neurosurgeon    Quit date: 08/14/2009  . Alcohol Use: No      Review of Systems  All other systems reviewed and are negative.   12 point ROS otherwise negative except as noted above in HPI.  Allergies  Review of patient's allergies indicates no known allergies.  Home Medications   Current Outpatient Rx  Name  Route  Sig  Dispense  Refill  . AZITHROMYCIN 250 MG PO TABS      Take 2 tabs PO x 1 dose, then 1 tab PO QD x 4 days   6 tablet   0   . HYDROCOD POLST-CPM POLST ER 10-8 MG/5ML PO LQCR   Oral   Take 5 mLs by mouth every 12 (twelve) hours as needed (cough).   60 mL   0   . ESOMEPRAZOLE MAGNESIUM 40 MG PO CPDR   Oral   Take 1 capsule (40 mg total) by mouth daily before breakfast.   30 capsule   11   . ETANERCEPT 50 MG/ML [place]-B[place]  SOLN   Subcutaneous   Inject 50 mg into the skin once a week.         . METHOTREXATE 2.5 MG PO TABS   Oral   Take 2.5 mg by mouth once a week. Caution:Chemotherapy. Protect from light.          . OMEGA-3-ACID ETHYL ESTERS 1 G PO CAPS   Oral   Take 2 capsules (2 g total) by mouth 2 (two) times daily.   360 capsule   3   . TELMISARTAN 40 MG PO TABS   Oral   Take 1 tablet (40 mg total) by mouth daily.   90 tablet   3     BP 136/87  Pulse 73  Temp 98 F (36.7 C) (Oral)  Resp 16  Ht 6' (1.829 m)  Wt 194 lb 4 oz (88.111 kg)  BMI 26.34 kg/m2  SpO2 97%  Physical Exam  Constitutional: He appears well-developed and well-nourished.  HENT:  Head: Normocephalic and atraumatic.  Eyes: EOM are normal. Left eye exhibits discharge. No scleral icterus.  Mild R eye redness   Neck: Normal range of motion.  Cardiovascular: Normal rate, regular rhythm and normal heart sounds.   Pulmonary/Chest: Effort normal and breath sounds normal.  Abdominal: Soft.  Musculoskeletal: Normal range of motion.  Neurological: He is alert.  Skin: Skin is warm.      ED Course  Procedures (including critical care time)   Labs Reviewed  CBC WITH DIFFERENTIAL  SEDIMENTATION RATE  COMPREHENSIVE METABOLIC PANEL   No results found.   1. Redness of eye, left       MDM  Differential remains fairly broad for sxs including eye foreign body and severe conjunctivitis. There is also a higher concern that this may be iritis vs uveitis given eye limbic involvement and ? peripheral iris sedimentation, especially given underlying RA.  Pt was given ophthalmic tetracaine initially as fluorescein eye exam was performed. However, flourescein eye exam was not performed.  Pt set up for same day opthalmology appt for evaluation.  Baseline bloodwork including CBC, ESR, and CMET obtained.      The patient and/or caregiver has been counseled thoroughly with regard to treatment plan and/or medications  prescribed including dosage, schedule, interactions, rationale for use, and possible side effects and they verbalize understanding. Diagnoses and expected course of recovery discussed and will return if not improved as expected or if the condition worsens. Patient and/or caregiver verbalized understanding.             Doree Albee, MD 10/03/12 [phone]

## 2012-10-03 NOTE — ED Notes (Signed)
Patient c/o left eye pain and irritation woke up this morning with it. States now the right eye is irritating as well.

## 2012-10-04 LAB — COMPREHENSIVE METABOLIC PANEL
ALT: 11 U/L (ref 0–53)
AST: 14 U/L (ref 0–37)
Albumin: 4.3 g/dL (ref 3.5–5.2)
Calcium: 9.5 mg/dL (ref 8.4–10.5)
Chloride: 101 mEq/L (ref 96–112)
Potassium: 4.1 mEq/L (ref 3.5–5.3)
Sodium: 134 mEq/L — ABNORMAL LOW (ref 135–145)

## 2012-10-04 LAB — SEDIMENTATION RATE: Sed Rate: 11 mm/hr (ref 0–16)

## 2012-10-24 ENCOUNTER — Ambulatory Visit (INDEPENDENT_AMBULATORY_CARE_PROVIDER_SITE_OTHER): Payer: Managed Care, Other (non HMO) | Admitting: Sports Medicine

## 2012-10-24 ENCOUNTER — Encounter: Payer: Self-pay | Admitting: Sports Medicine

## 2012-10-24 VITALS — BP 146/80 | HR 66 | Ht 72.0 in | Wt 204.0 lb

## 2012-10-24 DIAGNOSIS — R030 Elevated blood-pressure reading, without diagnosis of hypertension: Secondary | ICD-10-CM

## 2012-10-24 DIAGNOSIS — M069 Rheumatoid arthritis, unspecified: Secondary | ICD-10-CM

## 2012-10-24 DIAGNOSIS — IMO0001 Reserved for inherently not codable concepts without codable children: Secondary | ICD-10-CM

## 2012-10-24 DIAGNOSIS — E785 Hyperlipidemia, unspecified: Secondary | ICD-10-CM | POA: Insufficient documentation

## 2012-10-24 MED ORDER — LISINOPRIL 20 MG PO TABS: 20.0000 mg | ORAL_TABLET | Freq: Every day | ORAL | Status: DC

## 2012-10-24 NOTE — Progress Notes (Signed)
Subjective:    CC: Followup  HPI: Hypertension: Has been on Micardis, blood pressure has been moderately well controlled unfortunately he does need a cheaper option.  Hyperlipidemia: Predominantly elevated triglycerides, has been on omega-3 fatty acids, and is due for recheck.  Rheumatoid arthritis: Under the care of Dr. Dierdre Forth in [place], symptoms are overall moderately well controlled.  Past medical history, Surgical history, Family history not pertinant except as noted below, Social history, Allergies, and medications have been entered into the medical record, reviewed, and no changes needed.   Review of Systems: No fevers, chills, night sweats, weight loss, chest pain, or shortness of breath.   Objective:    General: Well Developed, well nourished, and in no acute distress.  Neuro: Alert and oriented x3, extra-ocular muscles intact, sensation grossly intact.  HEENT: Normocephalic, atraumatic, pupils equal round reactive to light, neck supple, no masses, no lymphadenopathy, thyroid nonpalpable.  Skin: Warm and dry, no rashes. Cardiac: Regular rate and rhythm, no murmurs rubs or gallops.  Respiratory: Clear to auscultation bilaterally. Not using accessory muscles, speaking in full sentences.  Impression and Recommendations:

## 2012-10-24 NOTE — Assessment & Plan Note (Addendum)
Stable on etanercept and methotrexate. He does understand that if he does have monoarticular or pauciarticular flares we could certainly perform an ultrasound guided intra-articular steroid injection.

## 2012-10-24 NOTE — Assessment & Plan Note (Signed)
Switching to lisinopril. Checking CMET.

## 2012-10-24 NOTE — Assessment & Plan Note (Signed)
Rechecking lipid panel. Continue Lovaza. Might need to add fenofibrate if insufficient control.

## 2012-10-25 LAB — COMPREHENSIVE METABOLIC PANEL WITH GFR
AST: 15 U/L (ref 0–37)
BUN: 14 mg/dL (ref 6–23)
Calcium: 9.1 mg/dL (ref 8.4–10.5)
Chloride: 104 meq/L (ref 96–112)
Creat: 0.68 mg/dL (ref 0.50–1.35)
Total Bilirubin: 0.5 mg/dL (ref 0.3–1.2)

## 2012-10-25 LAB — COMPREHENSIVE METABOLIC PANEL
ALT: 9 U/L (ref 0–53)
Albumin: 4 g/dL (ref 3.5–5.2)
Alkaline Phosphatase: 99 U/L (ref 39–117)
CO2: 25 mEq/L (ref 19–32)
Glucose, Bld: 88 mg/dL (ref 70–99)
Potassium: 4.6 mEq/L (ref 3.5–5.3)
Sodium: 137 mEq/L (ref 135–145)
Total Protein: 7 g/dL (ref 6.0–8.3)

## 2012-10-25 LAB — LIPID PANEL
Cholesterol: 184 mg/dL (ref 0–200)
HDL: 26 mg/dL — ABNORMAL LOW (ref >39)
Total CHOL/HDL Ratio: 7.1 ratio
Triglycerides: 517 mg/dL — ABNORMAL HIGH (ref <150)

## 2012-10-28 MED ORDER — FENOFIBRATE 145 MG PO TABS: 145.0000 mg | ORAL_TABLET | Freq: Every day | ORAL | Status: DC

## 2012-10-28 NOTE — Addendum Note (Signed)
Addended by: Monica Becton on: 10/28/2012 08:55 AM   Modules accepted: Orders

## 2012-12-31 ENCOUNTER — Encounter: Payer: Self-pay | Admitting: Sports Medicine

## 2012-12-31 ENCOUNTER — Ambulatory Visit (INDEPENDENT_AMBULATORY_CARE_PROVIDER_SITE_OTHER): Payer: Managed Care, Other (non HMO)

## 2012-12-31 ENCOUNTER — Ambulatory Visit (INDEPENDENT_AMBULATORY_CARE_PROVIDER_SITE_OTHER): Payer: Managed Care, Other (non HMO) | Admitting: Sports Medicine

## 2012-12-31 VITALS — BP 156/85 | HR 69 | Wt 207.0 lb

## 2012-12-31 DIAGNOSIS — R05 Cough: Secondary | ICD-10-CM

## 2012-12-31 DIAGNOSIS — K311 Adult hypertrophic pyloric stenosis: Secondary | ICD-10-CM

## 2012-12-31 DIAGNOSIS — R059 Cough, unspecified: Secondary | ICD-10-CM

## 2012-12-31 DIAGNOSIS — E785 Hyperlipidemia, unspecified: Secondary | ICD-10-CM

## 2012-12-31 DIAGNOSIS — J040 Acute laryngitis: Secondary | ICD-10-CM

## 2012-12-31 DIAGNOSIS — I10 Essential (primary) hypertension: Secondary | ICD-10-CM

## 2012-12-31 MED ORDER — LISINOPRIL-HYDROCHLOROTHIAZIDE 20-25 MG PO TABS: 1.0000 | ORAL_TABLET | Freq: Every day | ORAL | Status: DC

## 2012-12-31 MED ORDER — DOXYCYCLINE HYCLATE 100 MG PO TABS: 100.0000 mg | ORAL_TABLET | Freq: Two times a day (BID) | ORAL | Status: AC

## 2012-12-31 MED ORDER — ATORVASTATIN CALCIUM 40 MG PO TABS: 40.0000 mg | ORAL_TABLET | Freq: Every day | ORAL | Status: DC

## 2012-12-31 MED ORDER — ESOMEPRAZOLE MAGNESIUM 40 MG PO CPDR: 40.0000 mg | DELAYED_RELEASE_CAPSULE | Freq: Every day | ORAL | Status: DC

## 2012-12-31 MED ORDER — HYDROCOD POLST-CHLORPHEN POLST 10-8 MG/5ML PO LQCR: 5.0000 mL | Freq: Two times a day (BID) | ORAL | Status: DC | PRN

## 2012-12-31 NOTE — Assessment & Plan Note (Signed)
Refilling Nexium. 

## 2012-12-31 NOTE — Assessment & Plan Note (Signed)
TriCor was too expensive. Adding Lipitor.

## 2012-12-31 NOTE — Assessment & Plan Note (Signed)
Still insufficiently controlled, switching to lisinopril/chlorothiazide. Return in 2 weeks to recheck.

## 2012-12-31 NOTE — Progress Notes (Signed)
Subjective:    CC: Followup  Hypertension: Insufficiently controlled with lisinopril.  Hypertriglyceridemia: TriCor was too expensive.  Cough: Present for several months now, got better after azithromycin and prednisone prescribed in urgent care. Currently is recurrent, he has coarseness, nonproductive cough, no constitutional symptoms. Symptoms are moderate, worsening. He is taking Enbrel and prednisone for rheumatoid arthritis.  Past medical history, Surgical history, Family history not pertinant except as noted below, Social history, Allergies, and medications have been entered into the medical record, reviewed, and no changes needed.   Review of Systems: No fevers, chills, night sweats, weight loss, chest pain, or shortness of breath.   Objective:    General: Well Developed, well nourished, and in no acute distress.  Neuro: Alert and oriented x3, extra-ocular muscles intact, sensation grossly intact.  HEENT: Normocephalic, atraumatic, pupils equal round reactive to light, neck supple, no masses, no lymphadenopathy, thyroid nonpalpable.  Skin: Warm and dry, no rashes. Cardiac: Regular rate and rhythm, no murmurs rubs or gallops.  Respiratory:  coarse lung sounds bilaterally without overt crackles. Not using accessory muscles, speaking in full sentences. Impression and Recommendations:

## 2012-12-31 NOTE — Assessment & Plan Note (Addendum)
With coarse lung sounds and laryngitis. As he is relatively immunosuppressed with the Etanercept as well as prednisone I am going to treat with an extended course of doxycycline as he has recently had a course of azithromycin. Chest x-ray. Adding Tussionex, we certainly need to consider uncontrolled acid reflux if this is insufficient to control his symptoms. Return as needed for this.

## 2013-01-21 ENCOUNTER — Ambulatory Visit: Payer: Managed Care, Other (non HMO) | Admitting: Sports Medicine

## 2013-01-23 ENCOUNTER — Ambulatory Visit (INDEPENDENT_AMBULATORY_CARE_PROVIDER_SITE_OTHER): Payer: Managed Care, Other (non HMO) | Admitting: Sports Medicine

## 2013-01-23 ENCOUNTER — Encounter: Payer: Self-pay | Admitting: Sports Medicine

## 2013-01-23 VITALS — BP 118/80 | HR 83 | Wt 201.0 lb

## 2013-01-23 DIAGNOSIS — R059 Cough, unspecified: Secondary | ICD-10-CM

## 2013-01-23 DIAGNOSIS — I10 Essential (primary) hypertension: Secondary | ICD-10-CM

## 2013-01-23 DIAGNOSIS — R05 Cough: Secondary | ICD-10-CM

## 2013-01-23 DIAGNOSIS — K219 Gastro-esophageal reflux disease without esophagitis: Secondary | ICD-10-CM

## 2013-01-23 DIAGNOSIS — J387 Other diseases of larynx: Secondary | ICD-10-CM

## 2013-01-23 DIAGNOSIS — E785 Hyperlipidemia, unspecified: Secondary | ICD-10-CM

## 2013-01-23 MED ORDER — LORATADINE 10 MG PO TABS: 10.0000 mg | ORAL_TABLET | Freq: Every day | ORAL | Status: DC

## 2013-01-23 MED ORDER — FLUTICASONE PROPIONATE 50 MCG/ACT NA SUSP: NASAL | Status: DC

## 2013-01-23 MED ORDER — LISINOPRIL-HYDROCHLOROTHIAZIDE 20-25 MG PO TABS: 1.0000 | ORAL_TABLET | Freq: Every day | ORAL | Status: DC

## 2013-01-23 NOTE — Assessment & Plan Note (Signed)
Well controlled, no changes 

## 2013-01-23 NOTE — Assessment & Plan Note (Signed)
Differential diagnosis does include uncontrolled postnasal drip versus laryngeal pharyngeal reflux. At this point I am going to have him switch his Nexium to dinnertime, and add Claritin and Flonase. Like to see him back in one month to see how things are going.

## 2013-01-23 NOTE — Assessment & Plan Note (Signed)
Rechecking lipids. 

## 2013-01-23 NOTE — Assessment & Plan Note (Signed)
Continue Nexium and switch to bedtime.

## 2013-01-23 NOTE — Progress Notes (Signed)
  Subjective:    CC: Follow up  HPI: Hypertension: Well controlled.  Hoarseness: Still present, taking Nexium in the morning, seems to be worse now the pollen has started, clears throat throughout the day, no sour brash, no epigastric burning. No rhinitis.  Hyperlipidemia: No issues so far with Lipitor for hypertriglyceridemia.   Past medical history, Surgical history, Family history not pertinant except as noted below, Social history, Allergies, and medications have been entered into the medical record, reviewed, and no changes needed.   Review of Systems: No fevers, chills, night sweats, weight loss, chest pain, or shortness of breath.   Objective:    General: Well Developed, well nourished, and in no acute distress.  Neuro: Alert and oriented x3, extra-ocular muscles intact, sensation grossly intact.  HEENT: Normocephalic, atraumatic, pupils equal round reactive to light, neck supple, no masses, no lymphadenopathy, thyroid nonpalpable.  Skin: Warm and dry, no rashes. Cardiac: Regular rate and rhythm, no murmurs rubs or gallops, no lower extremity edema.  Respiratory: Clear to auscultation bilaterally. Not using accessory muscles, speaking in full sentences. Impression and Recommendations:

## 2013-02-04 ENCOUNTER — Other Ambulatory Visit: Payer: Self-pay | Admitting: *Deleted

## 2013-02-04 DIAGNOSIS — R05 Cough: Secondary | ICD-10-CM

## 2013-02-04 MED ORDER — FLUTICASONE PROPIONATE 50 MCG/ACT NA SUSP: NASAL | Status: DC

## 2013-02-20 ENCOUNTER — Ambulatory Visit (INDEPENDENT_AMBULATORY_CARE_PROVIDER_SITE_OTHER): Payer: Managed Care, Other (non HMO) | Admitting: Sports Medicine

## 2013-02-20 ENCOUNTER — Encounter: Payer: Self-pay | Admitting: Sports Medicine

## 2013-02-20 VITALS — BP 119/76 | HR 68 | Wt 205.0 lb

## 2013-02-20 DIAGNOSIS — E785 Hyperlipidemia, unspecified: Secondary | ICD-10-CM

## 2013-02-20 LAB — COMPREHENSIVE METABOLIC PANEL
AST: 14 U/L (ref 0–37)
Albumin: 4.1 g/dL (ref 3.5–5.2)
Alkaline Phosphatase: 94 U/L (ref 39–117)
BUN: 17 mg/dL (ref 6–23)
Potassium: 4.6 mEq/L (ref 3.5–5.3)
Sodium: 136 mEq/L (ref 135–145)
Total Bilirubin: 0.6 mg/dL (ref 0.3–1.2)

## 2013-02-20 LAB — LIPID PANEL
Cholesterol: 139 mg/dL (ref 0–200)
HDL: 23 mg/dL — ABNORMAL LOW (ref >39)
Total CHOL/HDL Ratio: 6 ratio
Triglycerides: 420 mg/dL — ABNORMAL HIGH (ref <150)

## 2013-02-20 LAB — COMPREHENSIVE METABOLIC PANEL WITH GFR
ALT: 9 U/L (ref 0–53)
CO2: 25 meq/L (ref 19–32)
Calcium: 9.2 mg/dL (ref 8.4–10.5)
Chloride: 102 meq/L (ref 96–112)
Creat: 0.94 mg/dL (ref 0.50–1.35)
Glucose, Bld: 84 mg/dL (ref 70–99)
Total Protein: 7.1 g/dL (ref 6.0–8.3)

## 2013-02-20 MED ORDER — FENOFIBRATE 145 MG PO TABS: 145.0000 mg | ORAL_TABLET | Freq: Every day | ORAL | Status: DC

## 2013-02-20 NOTE — Assessment & Plan Note (Signed)
Inadequate response to triglycerides with atorvastatin. I'm going to have him stop the atorvastatin, but hold on to it, and start fenofibrate. We will recheck lipids in 6 weeks afterwards.

## 2013-02-20 NOTE — Progress Notes (Signed)
  Subjective:    CC: Followup  HPI: Cough: Improved with addition of Claritin and Flonase.  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  Hypertriglyceridemia: Insufficient control with atorvastatin. No side effects.  Past medical history, Surgical history, Family history not pertinant except as noted below, Social history, Allergies, and medications have been entered into the medical record, reviewed, and no changes needed.   Review of Systems: No fevers, chills, night sweats, weight loss, chest pain, or shortness of breath.   Objective:    General: Well Developed, well nourished, and in no acute distress.  Neuro: Alert and oriented x3, extra-ocular muscles intact, sensation grossly intact.  HEENT: Normocephalic, atraumatic, pupils equal round reactive to light, neck supple, no masses, no lymphadenopathy, thyroid nonpalpable.  Skin: Warm and dry, no rashes. Cardiac: Regular rate and rhythm, no murmurs rubs or gallops, no lower extremity edema.  Respiratory: Clear to auscultation bilaterally. Not using accessory muscles, speaking in full sentences. Impression and Recommendations:

## 2013-04-03 ENCOUNTER — Ambulatory Visit (INDEPENDENT_AMBULATORY_CARE_PROVIDER_SITE_OTHER): Payer: Managed Care, Other (non HMO) | Admitting: Sports Medicine

## 2013-04-03 ENCOUNTER — Encounter: Payer: Self-pay | Admitting: Sports Medicine

## 2013-04-03 VITALS — BP 120/79 | HR 67 | Wt 205.0 lb

## 2013-04-03 DIAGNOSIS — E785 Hyperlipidemia, unspecified: Secondary | ICD-10-CM

## 2013-04-03 NOTE — Assessment & Plan Note (Signed)
Has been on fenofibrate now for approximately 6 weeks. Rechecking fasting lipids. Could certainly try niacin if insufficient response with high-dose fenofibrate.

## 2013-04-03 NOTE — Progress Notes (Signed)
  Subjective:    CC: Followup  HPI: Hypertriglyceridemia: Jewelz has been on fenofibrate now for approximately 6 weeks. He is due to recheck his lipids. No other complaints.  Past medical history, Surgical history, Family history not pertinant except as noted below, Social history, Allergies, and medications have been entered into the medical record, reviewed, and no changes needed.   Review of Systems: No fevers, chills, night sweats, weight loss, chest pain, or shortness of breath.   Objective:    General: Well Developed, well nourished, and in no acute distress.  Neuro: Alert and oriented x3, extra-ocular muscles intact, sensation grossly intact.  HEENT: Normocephalic, atraumatic, pupils equal round reactive to light, neck supple, no masses, no lymphadenopathy, thyroid nonpalpable.  Skin: Warm and dry, no rashes. Cardiac: Regular rate and rhythm, no murmurs rubs or gallops, no lower extremity edema.  Respiratory: Clear to auscultation bilaterally. Not using accessory muscles, speaking in full sentences. Impression and Recommendations:

## 2013-05-02 LAB — LDL CHOLESTEROL, DIRECT: Direct LDL: 131 mg/dL — ABNORMAL HIGH

## 2013-05-02 LAB — LIPID PANEL
Cholesterol: 206 mg/dL — ABNORMAL HIGH (ref 0–200)
HDL: 25 mg/dL — ABNORMAL LOW (ref >39)
LDL Cholesterol: 114 mg/dL — ABNORMAL HIGH (ref 0–99)
Total CHOL/HDL Ratio: 8.2 Ratio
Triglycerides: 335 mg/dL — ABNORMAL HIGH (ref <150)
VLDL: 67 mg/dL — ABNORMAL HIGH (ref 0–40)

## 2013-05-02 NOTE — Addendum Note (Signed)
Addended by: Monica Becton on: 05/02/2013 08:47 AM   Modules accepted: Orders

## 2013-06-23 ENCOUNTER — Ambulatory Visit (INDEPENDENT_AMBULATORY_CARE_PROVIDER_SITE_OTHER): Payer: Managed Care, Other (non HMO) | Admitting: Sports Medicine

## 2013-06-23 ENCOUNTER — Encounter: Payer: Self-pay | Admitting: Sports Medicine

## 2013-06-23 VITALS — BP 132/83 | HR 78 | Wt 195.0 lb

## 2013-06-23 DIAGNOSIS — R059 Cough, unspecified: Secondary | ICD-10-CM

## 2013-06-23 DIAGNOSIS — R05 Cough: Secondary | ICD-10-CM

## 2013-06-23 MED ORDER — ERYTHROMYCIN 5 MG/GM OP OINT: TOPICAL_OINTMENT | Freq: Three times a day (TID) | OPHTHALMIC | Status: AC

## 2013-06-23 MED ORDER — AZITHROMYCIN 250 MG PO TABS: ORAL_TABLET | ORAL | Status: DC

## 2013-06-23 MED ORDER — HYDROCOD POLST-CHLORPHEN POLST 10-8 MG/5ML PO LQCR: 5.0000 mL | Freq: Two times a day (BID) | ORAL | Status: DC | PRN

## 2013-06-23 NOTE — Progress Notes (Signed)
  Subjective:    CC: Sick  HPI: This pleasant 52 year old male comes in with a several day history of headache, pain and pressure behind the sinuses, itchy red eyes, mild cough, muscle aches, body aches, low-grade fevers and chills. He denies any sick contacts. Symptoms are moderate, persistent, worsening. Denies any shortness of breath, chest pain, changes in his urination, abdominal pain.  Past medical history, Surgical history, Family history not pertinant except as noted below, Social history, Allergies, and medications have been entered into the medical record, reviewed, and no changes needed.   Review of Systems: No fevers, chills, night sweats, weight loss, chest pain, or shortness of breath.   Objective:    General: Well Developed, well nourished, and in no acute distress.  Neuro: Alert and oriented x3, extra-ocular muscles intact, sensation grossly intact.  HEENT: Normocephalic, atraumatic, pupils equal round reactive to light, neck supple, no masses, no lymphadenopathy, thyroid nonpalpable. Injected bilateral conjunctiva. Tender to palpation over the left maxillary sinus, external ear canals are unremarkable, nasopharynx shows boggy and erythematous turbinates. Skin: Warm and dry, no rashes. Cardiac: Regular rate and rhythm, no murmurs rubs or gallops, no lower extremity edema.  Respiratory: Clear to auscultation bilaterally. Not using accessory muscles, speaking in full sentences. Impression and Recommendations:

## 2013-06-23 NOTE — Assessment & Plan Note (Signed)
With persistent body aches, muscle aches, sinus pressure, resembling maxillary sinusitis. Bilateral conjunctivitis. Persistent cough. With sinus infection, azithromycin, Flonase. Tussionex for cough. Out of work for 2 days. Tylenol as needed for pain.

## 2013-07-02 ENCOUNTER — Other Ambulatory Visit: Payer: Self-pay | Admitting: *Deleted

## 2013-07-02 ENCOUNTER — Telehealth: Payer: Self-pay | Admitting: *Deleted

## 2013-07-02 DIAGNOSIS — E785 Hyperlipidemia, unspecified: Secondary | ICD-10-CM

## 2013-07-02 MED ORDER — ATORVASTATIN CALCIUM 40 MG PO TABS: 40.0000 mg | ORAL_TABLET | Freq: Every day | ORAL | Status: DC

## 2013-07-02 NOTE — Telephone Encounter (Signed)
Spoke with pt's wife and she states he has not been taking the Lipitor. He states he never received it from the pharmacy. I have resent the medication and cancelled his appt for Friday for f/u cholesterol. I will contact pt and let him know to f/u in 6 weeks with bloodwork drawn.  Meyer Cory, LPN

## 2013-07-02 NOTE — Telephone Encounter (Signed)
Please refill, he could restart all medications and recheck lab work in 6 weeks.

## 2013-07-04 ENCOUNTER — Ambulatory Visit: Payer: Managed Care, Other (non HMO) | Admitting: Sports Medicine

## 2013-11-06 ENCOUNTER — Telehealth: Payer: Self-pay | Admitting: *Deleted

## 2013-11-06 DIAGNOSIS — E785 Hyperlipidemia, unspecified: Secondary | ICD-10-CM

## 2013-11-06 NOTE — Telephone Encounter (Signed)
Lab entered for cholesterol check. Clemetine Marker, LPN

## 2013-11-07 LAB — LIPID PANEL
Cholesterol: 150 mg/dL (ref 0–200)
HDL: 29 mg/dL — ABNORMAL LOW (ref >39)
LDL Cholesterol: 74 mg/dL (ref 0–99)
Total CHOL/HDL Ratio: 5.2 Ratio
Triglycerides: 235 mg/dL — ABNORMAL HIGH (ref <150)
VLDL: 47 mg/dL — ABNORMAL HIGH (ref 0–40)

## 2013-12-10 ENCOUNTER — Other Ambulatory Visit: Payer: Self-pay

## 2013-12-10 DIAGNOSIS — K219 Gastro-esophageal reflux disease without esophagitis: Secondary | ICD-10-CM

## 2013-12-10 MED ORDER — ESOMEPRAZOLE MAGNESIUM 40 MG PO CPDR: DELAYED_RELEASE_CAPSULE | ORAL | Status: DC

## 2013-12-10 NOTE — Telephone Encounter (Signed)
Refilled Nexium 

## 2014-01-30 ENCOUNTER — Emergency Department (INDEPENDENT_AMBULATORY_CARE_PROVIDER_SITE_OTHER)
Admission: EM | Admit: 2014-01-30 | Discharge: 2014-01-30 | Disposition: A | Discharge status: Home / Self Care | Payer: Managed Care, Other (non HMO) | Source: Home / Self Care | Attending: Family Medicine | Admitting: Family Medicine

## 2014-01-30 ENCOUNTER — Encounter: Payer: Self-pay | Admitting: Emergency Medicine

## 2014-01-30 DIAGNOSIS — J029 Acute pharyngitis, unspecified: Secondary | ICD-10-CM

## 2014-01-30 DIAGNOSIS — K122 Cellulitis and abscess of mouth: Secondary | ICD-10-CM

## 2014-01-30 LAB — POCT RAPID STREP A (OFFICE): RAPID STREP A SCREEN: NEGATIVE

## 2014-01-30 MED ORDER — CEFDINIR 300 MG PO CAPS: 300.0000 mg | ORAL_CAPSULE | Freq: Two times a day (BID) | ORAL | Status: DC

## 2014-01-30 MED ORDER — NYSTATIN 100000 UNIT/ML MT SUSP: 5.0000 mL | Freq: Four times a day (QID) | OROMUCOSAL | Status: DC

## 2014-01-30 NOTE — ED Notes (Signed)
Sore throat, fever, chills, body aches x 3 days

## 2014-01-30 NOTE — ED Provider Notes (Signed)
CSN: [id number]     Arrival date & time 01/30/14  1656 History   First MD Initiated Contact with Patient 01/30/14 1704     Chief Complaint  Patient presents with  . Sore Throat      HPI Comments: The patient developed fatigue, myalgias, headache, and sore throat 3 days ago.  He has had chills/sweats and loose stools but no nausea/vomiting.  He denies nasal congestion and cough  The history is provided by the patient and the spouse.    Past Medical History  Diagnosis Date  . Arthritis   . Reflux esophagitis   . GERD (gastroesophageal reflux disease)   . Diverticulitis   . High triglycerides    Past Surgical History  Procedure Laterality Date  . Feet surgery    . Bilateral feet reconstruction  10/2000   Family History  Problem Relation Age of Onset  . Hypertension Mother   . Hypertension Father    History  Substance Use Topics  . Smoking status: Former Smoker    Quit date: 02/07/1991  . Smokeless tobacco: Former Systems developer    Quit date: 08/14/2009  . Alcohol Use: No    Review of Systems + sore throat No cough No pleuritic pain No wheezing No nasal congestion No post-nasal drainage No sinus pain/pressure No itchy/red eyes No earache No hemoptysis No SOB ? fever, + chills No nausea No vomiting No abdominal pain + diarrhea No urinary symptoms No skin rash + fatigue + myalgias + headache Used OTC meds without relief  Allergies  Review of patient's allergies indicates not on file.  Home Medications   Prior to Admission medications   Medication Sig Start Date End Date Taking? Authorizing Provider  atorvastatin (LIPITOR) 40 MG tablet Take 1 tablet (40 mg total) by mouth daily. 07/02/13   Silverio Decamp, MD  azithromycin (ZITHROMAX Z-PAK) 250 MG tablet Take 2 tablets (500 mg) on  Day 1,  followed by 1 tablet (250 mg) once daily on Days 2 through 5. 06/23/13   Silverio Decamp, MD  chlorpheniramine-HYDROcodone (TUSSIONEX) 10-8 MG/5ML LQCR Take 5 mLs by  mouth every 12 (twelve) hours as needed (cough, will cause drowsiness.). 06/23/13   Silverio Decamp, MD  esomeprazole (NEXIUM) 40 MG capsule One tab by mouth at dinner time. 12/10/13   Silverio Decamp, MD  etanercept (ENBREL) 50 MG/ML injection Inject 50 mg into the skin once a week.    Historical Provider, MD  fenofibrate (TRICOR) 145 MG tablet Take 1 tablet (145 mg total) by mouth daily. 02/20/13   Silverio Decamp, MD  fluticasone (FLONASE) 50 MCG/ACT nasal spray One spray in each nostril twice a day, use left hand for right nostril, and right hand for left nostril. 02/04/13   Silverio Decamp, MD  lisinopril-hydrochlorothiazide (PRINZIDE,ZESTORETIC) 20-25 MG per tablet Take 1 tablet by mouth daily. 01/23/13   Silverio Decamp, MD  loratadine (CLARITIN) 10 MG tablet Take 1 tablet (10 mg total) by mouth daily. 01/23/13   Silverio Decamp, MD  methotrexate (RHEUMATREX) 2.5 MG tablet Take 2.5 mg by mouth once a week. Caution:Chemotherapy. Protect from light.     Historical Provider, MD   BP 110/74  Pulse 98  Temp(Src) 97.7 F (36.5 C) (Oral)  Ht 6' (1.829 m)  Wt 192 lb (87.091 kg)  BMI 26.03 kg/m2  SpO2 96% Physical Exam Nursing notes and Vital Signs reviewed. Appearance:  Patient appears stated age, and in no acute distress Eyes:  Pupils are  equal, round, and reactive to light and accomodation.  Extraocular movement is intact.  Conjunctivae are not inflamed  Ears:  Canals normal.  Tympanic membranes normal.  Nose:  Mildly congested turbinates.  No sinus tenderness.  Pharynx:  Erythematous, including uvula which is slightly edematous.  White plaques are present on soft palate and pharynx. Neck:  Supple.   Tender enlarged anterior/posterior nodes are palpated bilaterally  Lungs:  Clear to auscultation.  Breath sounds are equal.  Heart:  Regular rate and rhythm without murmurs, rubs, or gallops.  Abdomen:  Nontender without masses or hepatosplenomegaly.  Bowel sounds  are present.  No CVA or flank tenderness.  Extremities:  No edema.  No calf tenderness Skin:  No rash present.   ED Course  Procedures  none    Labs Reviewed  STREP A DNA PROBE  POCT RAPID STREP A (OFFICE) negative         MDM   1. Acute pharyngitis; suspect oral candidiasis as well (note that patient is on etanercept  2. Uvulitis    Throat culture pending.  Begin Omnicef.  Nystatin suspension QID May take Tylenol for pain/fever.  Try warm salt water gargles. Followup with Family Doctor if not improved in 4 days. If symptoms become significantly worse during the night or over the weekend, proceed to the local emergency room.     Kandra Nicolas, MD 01/30/14 2008

## 2014-01-30 NOTE — Discharge Instructions (Signed)
May take Tylenol for pain/fever.  Try warm salt water gargles.   Salt Water Gargle This solution will help make your mouth and throat feel better. HOME CARE INSTRUCTIONS   Mix 1 teaspoon of salt in 8 ounces of warm water.  Gargle with this solution as much or often as you need or as directed. Swish and gargle gently if you have any sores or wounds in your mouth.  Do not swallow this mixture. Document Released: 06/29/2004 Document Revised: 12/18/2011 Document Reviewed: 11/20/2008 [facility] Patient Information 2014 [place].   Uvulitis Uvulitis is redness and soreness (inflammation) of the uvula. The uvula is the small tongue-shaped piece of tissue in the back of your mouth.  CAUSES Infection is a common cause of uvulitis. Infection of the uvula can be either viral or bacterial. Infectious uvulitis usually only occurs in association with another condition, such as inflammation and infection of the mouth or throat. Other causes of uvulitis include:  Trauma to the uvula.  Swelling from excess fluid buildup (edema), which may be an allergic reaction.  Inhalation of irritants, such as chemical agents, smoke, or steam. DIAGNOSIS Your caregiver can usually diagnose uvulitis through a physical examination. Bacterial uvulitis can be diagnosed through the results of the growth of samples of bodily substances taken from your mouth (cultures). HOME CARE INSTRUCTIONS   Rest as much as possible.  Young children may suck on frozen juice bars or frozen ice pops. Older children and adults may gargle with a warm or cold liquid to help soothe the throat. (Mix  tsp of salt in 8 oz of water, or use strong tea.)  Use a cool-mist humidifier to lessen throat irritation and cough.  Drink enough fluids to keep your urine clear or pale yellow.  While the throat is very sore, eat soft or liquid foods such as milk, ice cream, soups, or milk drinks.  Family members who develop a sore throat or  fever should have a medical exam or throat culture.  If your child has uvulitis and is taking antibiotic medicine, wait 24 hours or until his or her temperature is near normal (less than 100 F [37.8 C]) before allowing him or her to return to school or day care.  Only take over-the-counter or prescription medicines for pain, discomfort, or fever as directed by your caregiver. Ask when your test results will be ready. Make sure you get your test results. SEEK MEDICAL CARE IF:   You have an oral temperature above 102 F (38.9 C).  You develop large, tender lumps your the neck.  Your child develops a rash.  You cough up green, yellow-brown, or bloody substances. SEEK IMMEDIATE MEDICAL CARE IF:   You develop any new symptoms, such as vomiting, earache, severe headache, stiff neck, chest pain, or trouble breathing or swallowing.  Your airway is blocked.  You develop more severe throat pain along with drooling or voice changes. Document Released: 05/05/2004 Document Revised: 12/18/2011 Document Reviewed: 12/01/2010 [facility] Patient Information 2014 [place], [place].

## 2014-01-31 LAB — STREP A DNA PROBE: GASP: NEGATIVE

## 2014-02-02 ENCOUNTER — Telehealth: Payer: Self-pay | Admitting: *Deleted

## 2014-03-24 ENCOUNTER — Other Ambulatory Visit: Payer: Self-pay | Admitting: *Deleted

## 2014-03-24 DIAGNOSIS — E785 Hyperlipidemia, unspecified: Secondary | ICD-10-CM

## 2014-03-24 DIAGNOSIS — I10 Essential (primary) hypertension: Secondary | ICD-10-CM

## 2014-03-24 MED ORDER — FENOFIBRATE 145 MG PO TABS: 145.0000 mg | ORAL_TABLET | Freq: Every day | ORAL | Status: DC

## 2014-03-24 MED ORDER — LISINOPRIL-HYDROCHLOROTHIAZIDE 20-25 MG PO TABS: 1.0000 | ORAL_TABLET | Freq: Every day | ORAL | Status: DC

## 2014-10-13 ENCOUNTER — Encounter: Payer: Self-pay | Admitting: *Deleted

## 2014-10-13 ENCOUNTER — Emergency Department
Admission: EM | Admit: 2014-10-13 | Discharge: 2014-10-13 | Disposition: A | Discharge status: Home / Self Care | Payer: Managed Care, Other (non HMO) | Source: Home / Self Care | Attending: Emergency Medicine | Admitting: Emergency Medicine

## 2014-10-13 DIAGNOSIS — J0120 Acute ethmoidal sinusitis, unspecified: Secondary | ICD-10-CM

## 2014-10-13 DIAGNOSIS — J0121 Acute recurrent ethmoidal sinusitis: Secondary | ICD-10-CM | POA: Diagnosis not present

## 2014-10-13 MED ORDER — AMOXICILLIN-POT CLAVULANATE 875-125 MG PO TABS: 1.0000 | ORAL_TABLET | Freq: Two times a day (BID) | ORAL | Status: DC

## 2014-10-13 NOTE — ED Provider Notes (Signed)
CSN: [id number]     Arrival date & time 10/13/14  1357 History   First MD Initiated Contact with Patient 10/13/14 1437     Chief Complaint  Patient presents with  . Nasal Congestion  . Hoarse  . Cough   (Consider location/radiation/quality/duration/timing/severity/associated sxs/prior Treatment) Patient is a 54 y.o. male presenting with cough. The history is provided by the patient. No language interpreter was used.  Cough Cough characteristics:  Non-productive Severity:  Mild Timing:  Constant Progression:  Worsening Relieved by:  Nothing Worsened by:  Nothing tried Associated symptoms: headaches, rhinorrhea and sinus congestion     Past Medical History  Diagnosis Date  . Arthritis   . Reflux esophagitis   . GERD (gastroesophageal reflux disease)   . Diverticulitis   . High triglycerides    Past Surgical History  Procedure Laterality Date  . Feet surgery    . Bilateral feet reconstruction  10/2000   Family History  Problem Relation Age of Onset  . Hypertension Mother   . Hypertension Father    History  Substance Use Topics  . Smoking status: Former Smoker    Quit date: 02/07/1991  . Smokeless tobacco: Former Systems developer    Quit date: 08/14/2009  . Alcohol Use: No    Review of Systems  HENT: Positive for rhinorrhea and sinus pressure.   Respiratory: Positive for cough.   Neurological: Positive for headaches.  All other systems reviewed and are negative. Pt complains of feeling like he has a   Allergies  Review of patient's allergies indicates no known allergies.  Home Medications   Prior to Admission medications   Medication Sig Start Date End Date Taking? Authorizing Provider  amoxicillin-clavulanate (AUGMENTIN) 875-125 MG per tablet Take 1 tablet by mouth every 12 (twelve) hours. 10/13/14   Fransico Meadow, PA-C  atorvastatin (LIPITOR) 40 MG tablet Take 1 tablet (40 mg total) by mouth daily. 07/02/13   Silverio Decamp, MD  cefdinir (OMNICEF) 300 MG capsule  Take 1 capsule (300 mg total) by mouth 2 (two) times daily. 01/30/14   Kandra Nicolas, MD  esomeprazole (NEXIUM) 40 MG capsule One tab by mouth at dinner time. 12/10/13   Silverio Decamp, MD  etanercept (ENBREL) 50 MG/ML injection Inject 50 mg into the skin once a week.    Historical Provider, MD  fenofibrate (TRICOR) 145 MG tablet Take 1 tablet (145 mg total) by mouth daily. 03/24/14   Silverio Decamp, MD  fluticasone (FLONASE) 50 MCG/ACT nasal spray One spray in each nostril twice a day, use left hand for right nostril, and right hand for left nostril. 02/04/13   Silverio Decamp, MD  lisinopril-hydrochlorothiazide (PRINZIDE,ZESTORETIC) 20-25 MG per tablet Take 1 tablet by mouth daily. 03/24/14   Silverio Decamp, MD  loratadine (CLARITIN) 10 MG tablet Take 1 tablet (10 mg total) by mouth daily. 01/23/13   Silverio Decamp, MD  methotrexate (RHEUMATREX) 2.5 MG tablet Take 2.5 mg by mouth once a week. Caution:Chemotherapy. Protect from light.     Historical Provider, MD  nystatin (MYCOSTATIN) 100000 UNIT/ML suspension Take 5 mLs (500,000 Units total) by mouth 4 (four) times daily. Retain in mouth as long as possible 01/30/14   Kandra Nicolas, MD   BP 147/88 mmHg  Pulse 66  Temp(Src) 97.7 F (36.5 C) (Oral)  Resp 18  Ht 6' (1.829 m)  Wt 208 lb (94.348 kg)  BMI 28.20 kg/m2  SpO2 97% Physical Exam  Constitutional: He is oriented to  person, place, and time. He appears well-developed and well-nourished.  HENT:  Head: Normocephalic and atraumatic.  Left Ear: External ear normal.  Nose: Nose normal.  Mouth/Throat: Oropharynx is clear and moist.  Eyes: Conjunctivae and EOM are normal. Pupils are equal, round, and reactive to light.  Neck: Normal range of motion.  Cardiovascular: Normal rate and normal heart sounds.   Pulmonary/Chest: Effort normal.  Abdominal: Soft. He exhibits no distension.  Musculoskeletal: Normal range of motion.  Neurological: He is alert and  oriented to person, place, and time.  Skin: Skin is warm.  Psychiatric: He has a normal mood and affect.  Nursing note and vitals reviewed.   ED Course  Procedures (including critical care time) Labs Review Labs Reviewed - No data to display  Imaging Review No results found.   MDM   1. Acute ethmoidal sinusitis, recurrence not specified    Augmentin 875 20 bid    [place], [place] 10/13/14 1531

## 2014-10-13 NOTE — ED Notes (Signed)
Pt c/o nonproductive cough, nasal congestion and hoarseness x 5 days.

## 2014-10-13 NOTE — Discharge Instructions (Signed)

## 2014-10-14 ENCOUNTER — Telehealth: Payer: Self-pay | Admitting: *Deleted

## 2014-12-21 ENCOUNTER — Ambulatory Visit (INDEPENDENT_AMBULATORY_CARE_PROVIDER_SITE_OTHER): Payer: Managed Care, Other (non HMO) | Admitting: Sports Medicine

## 2014-12-21 ENCOUNTER — Encounter: Payer: Self-pay | Admitting: Sports Medicine

## 2014-12-21 ENCOUNTER — Ambulatory Visit (INDEPENDENT_AMBULATORY_CARE_PROVIDER_SITE_OTHER): Payer: Managed Care, Other (non HMO)

## 2014-12-21 VITALS — BP 168/66 | HR 99 | Ht 74.0 in | Wt 209.0 lb

## 2014-12-21 DIAGNOSIS — I10 Essential (primary) hypertension: Secondary | ICD-10-CM

## 2014-12-21 DIAGNOSIS — K219 Gastro-esophageal reflux disease without esophagitis: Secondary | ICD-10-CM

## 2014-12-21 DIAGNOSIS — J439 Emphysema, unspecified: Secondary | ICD-10-CM | POA: Insufficient documentation

## 2014-12-21 DIAGNOSIS — R05 Cough: Secondary | ICD-10-CM | POA: Insufficient documentation

## 2014-12-21 DIAGNOSIS — E785 Hyperlipidemia, unspecified: Secondary | ICD-10-CM

## 2014-12-21 DIAGNOSIS — R059 Cough, unspecified: Secondary | ICD-10-CM

## 2014-12-21 DIAGNOSIS — J387 Other diseases of larynx: Secondary | ICD-10-CM

## 2014-12-21 MED ORDER — LISINOPRIL-HYDROCHLOROTHIAZIDE 20-25 MG PO TABS: 1.0000 | ORAL_TABLET | Freq: Every day | ORAL | Status: DC

## 2014-12-21 MED ORDER — ESOMEPRAZOLE MAGNESIUM 40 MG PO CPDR: DELAYED_RELEASE_CAPSULE | ORAL | Status: DC

## 2014-12-21 MED ORDER — FENOFIBRATE 145 MG PO TABS: 145.0000 mg | ORAL_TABLET | Freq: Every day | ORAL | Status: DC

## 2014-12-21 MED ORDER — ATORVASTATIN CALCIUM 40 MG PO TABS: 40.0000 mg | ORAL_TABLET | Freq: Every day | ORAL | Status: DC

## 2014-12-21 MED ORDER — AZITHROMYCIN 250 MG PO TABS: ORAL_TABLET | ORAL | Status: DC

## 2014-12-21 NOTE — Assessment & Plan Note (Addendum)
Uncontrolled, refilling medication. We will recheck this in 4 weeks, Todd Valdez will continue to cut back on salt, and if still elevated we will probably need to add amlodipine.

## 2014-12-21 NOTE — Assessment & Plan Note (Signed)
Doing well on Lipitor and fenofibrate. Rechecking lipids, metabolic panel.

## 2014-12-21 NOTE — Progress Notes (Signed)
  Subjective:    CC: Follow-up  HPI: Hyperlipidemia: Stable on fenofibrate and Lipitor.   Hypertension: Uncontrolled.  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 Those are moderate, persistent.  Past medical history, Surgical history, Family history not pertinant except as noted below, Social history, Allergies, and medications have been entered into the medical record, reviewed, and no changes needed.   Review of Systems: No fevers, chills, night sweats, weight loss, chest pain, or shortness of breath.   Objective:    General: Well Developed, well nourished, and in no acute distress.  Neuro: Alert and oriented x3, extra-ocular muscles intact, sensation grossly intact.  HEENT: Normocephalic, atraumatic, pupils equal round reactive to light, neck supple, no masses, no lymphadenopathy, thyroid nonpalpable.  Skin: Warm and dry, no rashes. Cardiac: Regular rate and rhythm, no murmurs rubs or gallops, no lower extremity edema.  Respiratory:  Coarse sounds in the right baseNot using accessory muscles, speaking in full sentences.  Chest x-ray reviewed and shows some blunting of the right cardiophrenic angle, there is increased bronchial markings suggestive of bronchitis but no visible infiltrate.  Impression and Recommendations:

## 2014-12-21 NOTE — Assessment & Plan Note (Addendum)
Persistent despite treatment with amoxicillin in urgent care. He does have coarse sounds in the right lower lung field, chest x-ray, azithromycin. Both canals were irrigated today.

## 2014-12-21 NOTE — Assessment & Plan Note (Signed)
Well controlled, no changes 

## 2014-12-23 LAB — CBC
HCT: 43.2 % (ref 39.0–52.0)
Hemoglobin: 14.8 g/dL (ref 13.0–17.0)
MCH: 30.8 pg (ref 26.0–34.0)
MCHC: 34.3 g/dL (ref 30.0–36.0)
MCV: 89.8 fL (ref 78.0–100.0)
MPV: 10 fL (ref 8.6–12.4)
Platelets: 322 10*3/uL (ref 150–400)
RBC: 4.81 MIL/uL (ref 4.22–5.81)
RDW: 14.3 % (ref 11.5–15.5)
WBC: 9.3 K/uL (ref 4.0–10.5)

## 2014-12-23 LAB — COMPREHENSIVE METABOLIC PANEL
AST: 15 U/L (ref 0–37)
Albumin: 3.8 g/dL (ref 3.5–5.2)
Alkaline Phosphatase: 95 U/L (ref 39–117)
BUN: 12 mg/dL (ref 6–23)
Calcium: 9 mg/dL (ref 8.4–10.5)
Chloride: 105 mEq/L (ref 96–112)
Creat: 0.76 mg/dL (ref 0.50–1.35)
Potassium: 4.7 mEq/L (ref 3.5–5.3)
Sodium: 140 mEq/L (ref 135–145)
Total Protein: 6.8 g/dL (ref 6.0–8.3)

## 2014-12-23 LAB — LIPID PANEL
Cholesterol: 179 mg/dL (ref 0–200)
HDL: 22 mg/dL — ABNORMAL LOW (ref >=40)
Total CHOL/HDL Ratio: 8.1 Ratio
Triglycerides: 420 mg/dL — ABNORMAL HIGH (ref <150)

## 2014-12-23 LAB — HEMOGLOBIN A1C
Hgb A1c MFr Bld: 5.5 % (ref <5.7)
Mean Plasma Glucose: 111 mg/dL (ref <117)

## 2014-12-23 LAB — TSH: TSH: 3.738 u[IU]/mL (ref 0.350–4.500)

## 2014-12-23 LAB — COMPREHENSIVE METABOLIC PANEL WITH GFR
ALT: 12 U/L (ref 0–53)
CO2: 26 meq/L (ref 19–32)
Glucose, Bld: 87 mg/dL (ref 70–99)
Total Bilirubin: 0.4 mg/dL (ref 0.2–1.2)

## 2015-01-21 ENCOUNTER — Ambulatory Visit (INDEPENDENT_AMBULATORY_CARE_PROVIDER_SITE_OTHER): Payer: Managed Care, Other (non HMO) | Admitting: Sports Medicine

## 2015-01-21 ENCOUNTER — Encounter: Payer: Self-pay | Admitting: Sports Medicine

## 2015-01-21 VITALS — BP 121/69 | HR 81 | Ht 73.0 in | Wt 205.0 lb

## 2015-01-21 DIAGNOSIS — I10 Essential (primary) hypertension: Secondary | ICD-10-CM | POA: Diagnosis not present

## 2015-01-21 DIAGNOSIS — J309 Allergic rhinitis, unspecified: Secondary | ICD-10-CM | POA: Insufficient documentation

## 2015-01-21 DIAGNOSIS — J301 Allergic rhinitis due to pollen: Secondary | ICD-10-CM

## 2015-01-21 MED ORDER — AZELASTINE-FLUTICASONE 137-50 MCG/ACT NA SUSP: NASAL | Status: DC

## 2015-01-21 MED ORDER — LORATADINE 10 MG PO TABS: 10.0000 mg | ORAL_TABLET | Freq: Every day | ORAL | Status: DC

## 2015-01-21 NOTE — Assessment & Plan Note (Signed)
Adding Dymista and Claritin.

## 2015-01-21 NOTE — Assessment & Plan Note (Signed)
Controlled, no changes. 

## 2015-01-21 NOTE — Progress Notes (Signed)
  Subjective:    CC: Follow-up  HPI: Hypertension: Well controlled  Hyperlipidemia: Doing well with current medications  Chronic rhinitis: With itchy eyes, has not been using an antihistamine or any nasal steroid sprays.  Past medical history, Surgical history, Family history not pertinant except as noted below, Social history, Allergies, and medications have been entered into the medical record, reviewed, and no changes needed.   Review of Systems: No fevers, chills, night sweats, weight loss, chest pain, or shortness of breath.   Objective:    General: Well Developed, well nourished, and in no acute distress.  Neuro: Alert and oriented x3, extra-ocular muscles intact, sensation grossly intact.  HEENT: Normocephalic, atraumatic, pupils equal round reactive to light, neck supple, no masses, no lymphadenopathy, thyroid nonpalpable.  Skin: Warm and dry, no rashes. Cardiac: Regular rate and rhythm, no murmurs rubs or gallops, no lower extremity edema.  Respiratory: Clear to auscultation bilaterally. Not using accessory muscles, speaking in full sentences.  Impression and Recommendations:

## 2015-04-22 ENCOUNTER — Encounter: Payer: Self-pay | Admitting: Sports Medicine

## 2015-04-22 ENCOUNTER — Ambulatory Visit (INDEPENDENT_AMBULATORY_CARE_PROVIDER_SITE_OTHER): Payer: Managed Care, Other (non HMO) | Admitting: Sports Medicine

## 2015-04-22 DIAGNOSIS — R059 Cough, unspecified: Secondary | ICD-10-CM

## 2015-04-22 DIAGNOSIS — E785 Hyperlipidemia, unspecified: Secondary | ICD-10-CM | POA: Diagnosis not present

## 2015-04-22 DIAGNOSIS — R05 Cough: Secondary | ICD-10-CM

## 2015-04-22 MED ORDER — AZITHROMYCIN 250 MG PO TABS: ORAL_TABLET | ORAL | Status: DC

## 2015-04-22 MED ORDER — PSEUDOEPHEDRINE-GUAIFENESIN ER 120-1200 MG PO TB12: ORAL_TABLET | ORAL | Status: DC

## 2015-04-22 NOTE — Assessment & Plan Note (Addendum)
Rechecking lipids, continue fenofibrate and atorvastatin, previous lipid panel was off of medications.  Triglycerides are still elevated but improved from before, continue Lipitor, continue fenofibrate, adding vascepa.  Prescription and discount coupon left at the front.

## 2015-04-22 NOTE — Progress Notes (Signed)
  Subjective:    CC: Follow-up  HPI: Hypertriglyceridemia: Very elevated last check but he was off of medications.  Hypertension: Stable  Laryngitis: Present for 1.5 months, mild cough, he is immunosuppressed considering his rheumatoid arthritis and current treatment. Symptoms are moderate, persistent. Voice is overall improving.  Past medical history, Surgical history, Family history not pertinant except as noted below, Social history, Allergies, and medications have been entered into the medical record, reviewed, and no changes needed.   Review of Systems: No fevers, chills, night sweats, weight loss, chest pain, or shortness of breath.   Objective:    General: Well Developed, well nourished, and in no acute distress.  Neuro: Alert and oriented x3, extra-ocular muscles intact, sensation grossly intact.  HEENT: Normocephalic, atraumatic, pupils equal round reactive to light, neck supple, no masses, no lymphadenopathy, thyroid nonpalpable. Voice is hoarse, oropharynx, nasopharynx, ear canals unremarkable. Skin: Warm and dry, no rashes. Cardiac: Regular rate and rhythm, no murmurs rubs or gallops, no lower extremity edema.  Respiratory: Clear to auscultation bilaterally. Not using accessory muscles, speaking in full sentences.  Impression and Recommendations:

## 2015-04-22 NOTE — Assessment & Plan Note (Signed)
Persistent, with laryngitis, currently doing intranasal Flonase, and azithromycin, patient is immune suppressed and has had symptoms for a month and a half now, he will also need to use Mucinex D. If no better in 2 weeks we would likely send him to ENT for direct laryngitis could be.

## 2015-06-17 ENCOUNTER — Telehealth: Payer: Self-pay | Admitting: *Deleted

## 2015-06-17 DIAGNOSIS — M069 Rheumatoid arthritis, unspecified: Secondary | ICD-10-CM

## 2015-06-17 NOTE — Telephone Encounter (Signed)
Labs ordered.

## 2015-06-18 LAB — CBC WITH DIFFERENTIAL/PLATELET
Basophils Absolute: 0.1 K/uL (ref 0.0–0.1)
Basophils Relative: 1 % (ref 0–1)
Eosinophils Absolute: 0.4 10*3/uL (ref 0.0–0.7)
Eosinophils Relative: 4 % (ref 0–5)
HCT: 43.7 % (ref 39.0–52.0)
Hemoglobin: 14.3 g/dL (ref 13.0–17.0)
Lymphocytes Relative: 29 % (ref 12–46)
Lymphs Abs: 3 K/uL (ref 0.7–4.0)
MCH: 29.8 pg (ref 26.0–34.0)
MCHC: 32.7 g/dL (ref 30.0–36.0)
MCV: 91 fL (ref 78.0–100.0)
MPV: 9.8 fL (ref 8.6–12.4)
Monocytes Absolute: 0.8 10*3/uL (ref 0.1–1.0)
Monocytes Relative: 8 % (ref 3–12)
Neutro Abs: 6 10*3/uL (ref 1.7–7.7)
Neutrophils Relative %: 58 % (ref 43–77)
Platelets: 328 K/uL (ref 150–400)
RBC: 4.8 MIL/uL (ref 4.22–5.81)
RDW: 13.7 % (ref 11.5–15.5)
WBC: 10.3 10*3/uL (ref 4.0–10.5)

## 2015-06-18 LAB — LIPID PANEL
Cholesterol: 163 mg/dL (ref 125–200)
HDL: 28 mg/dL — ABNORMAL LOW (ref >=40)
LDL Cholesterol: 83 mg/dL (ref <130)
Total CHOL/HDL Ratio: 5.8 ratio — ABNORMAL HIGH (ref <=5.0)
Triglycerides: 258 mg/dL — ABNORMAL HIGH (ref <150)
VLDL: 52 mg/dL — ABNORMAL HIGH (ref <30)

## 2015-06-18 LAB — COMPREHENSIVE METABOLIC PANEL
ALT: 11 U/L (ref 9–46)
AST: 14 U/L (ref 10–35)
Albumin: 3.9 g/dL (ref 3.6–5.1)
Alkaline Phosphatase: 82 U/L (ref 40–115)
BUN: 16 mg/dL (ref 7–25)
CO2: 27 mmol/L (ref 20–31)
Calcium: 9.5 mg/dL (ref 8.6–10.3)
Chloride: 101 mmol/L (ref 98–110)
Creat: 0.78 mg/dL (ref 0.70–1.33)
Sodium: 139 mmol/L (ref 135–146)
Total Bilirubin: 0.4 mg/dL (ref 0.2–1.2)

## 2015-06-18 LAB — COMPREHENSIVE METABOLIC PANEL WITH GFR
Glucose, Bld: 84 mg/dL (ref 65–99)
Potassium: 4.5 mmol/L (ref 3.5–5.3)
Total Protein: 7 g/dL (ref 6.1–8.1)

## 2015-06-18 MED ORDER — ICOSAPENT ETHYL 1 G PO CAPS: 1.0000 | ORAL_CAPSULE | Freq: Two times a day (BID) | ORAL | Status: DC

## 2015-06-18 NOTE — Addendum Note (Signed)
Addended by: Silverio Decamp on: 06/18/2015 09:09 AM   Modules accepted: Orders

## 2015-10-25 ENCOUNTER — Ambulatory Visit (INDEPENDENT_AMBULATORY_CARE_PROVIDER_SITE_OTHER): Payer: Managed Care, Other (non HMO) | Admitting: Sports Medicine

## 2015-10-25 VITALS — BP 141/93 | HR 63 | Temp 97.6°F | Resp 18 | Wt 212.5 lb

## 2015-10-25 DIAGNOSIS — E785 Hyperlipidemia, unspecified: Secondary | ICD-10-CM

## 2015-10-25 DIAGNOSIS — J0100 Acute maxillary sinusitis, unspecified: Secondary | ICD-10-CM

## 2015-10-25 DIAGNOSIS — M069 Rheumatoid arthritis, unspecified: Secondary | ICD-10-CM | POA: Diagnosis not present

## 2015-10-25 DIAGNOSIS — I10 Essential (primary) hypertension: Secondary | ICD-10-CM

## 2015-10-25 MED ORDER — AMOXICILLIN-POT CLAVULANATE 875-125 MG PO TABS: 1.0000 | ORAL_TABLET | Freq: Two times a day (BID) | ORAL | Status: DC

## 2015-10-25 NOTE — Assessment & Plan Note (Signed)
Stable, no changes  

## 2015-10-25 NOTE — Assessment & Plan Note (Signed)
Elevated today but took a decongestant for sinus infection.

## 2015-10-25 NOTE — Progress Notes (Signed)
  Subjective:    CC: Follow-up  HPI: Hypertriglyceridemia: It turns out that Todd Valdez had only been taking atorvastatin had not been using his fenofibrate, he never started the vascepa, but simply has been taking both fenofibrate and atorvastatin as prescribed.  Hypertension:  Minimally elevated but did take an Alka-Seltzer sinus decongestant today.  Rheumatoid arthritis: Stable on current medications   Sinus infection: Present for a week, pain and pressure with nasal discharge behind the maxillary sinuses. Moderate, persistent.  Past medical history, Surgical history, Family history not pertinant except as noted below, Social history, Allergies, and medications have been entered into the medical record, reviewed, and no changes needed.   Review of Systems: No fevers, chills, night sweats, weight loss, chest pain, or shortness of breath.   Objective:    General: Well Developed, well nourished, and in no acute distress.  Neuro: Alert and oriented x3, extra-ocular muscles intact, sensation grossly intact.  HEENT: Normocephalic, atraumatic, pupils equal round reactive to light, neck supple, no masses, no lymphadenopathy, thyroid nonpalpable.  oropharynx, nasopharynx, ear canals unremarkable, tender to palpation over the maxillary sinuses.  Skin: Warm and dry, no rashes. Cardiac: Regular rate and rhythm, no murmurs rubs or gallops, no lower extremity edema.  Respiratory: Clear to auscultation bilaterally. Not using accessory muscles, speaking in full sentences.  Impression and Recommendations:

## 2015-10-25 NOTE — Assessment & Plan Note (Signed)
Had not been taking atorvastatin and fenofibrate together, he has been doing this for 6 months, rechecking lipids, if still with hypertriglyceridemia we will add Vascepa

## 2015-10-25 NOTE — Assessment & Plan Note (Signed)
Augmentin, Flonase. 

## 2015-10-26 LAB — LIPID PANEL
Cholesterol: 144 mg/dL (ref 125–200)
HDL: 29 mg/dL — ABNORMAL LOW (ref >=40)
LDL Cholesterol: 74 mg/dL (ref <130)
Total CHOL/HDL Ratio: 5 Ratio (ref <=5.0)
Triglycerides: 203 mg/dL — ABNORMAL HIGH (ref <150)
VLDL: 41 mg/dL — ABNORMAL HIGH (ref <30)

## 2015-10-26 LAB — COMPREHENSIVE METABOLIC PANEL WITH GFR
ALT: 15 U/L (ref 9–46)
Alkaline Phosphatase: 76 U/L (ref 40–115)
BUN: 13 mg/dL (ref 7–25)
Calcium: 9 mg/dL (ref 8.6–10.3)
Chloride: 103 mmol/L (ref 98–110)
Glucose, Bld: 84 mg/dL (ref 65–99)
Potassium: 4.7 mmol/L (ref 3.5–5.3)

## 2015-10-26 LAB — COMPREHENSIVE METABOLIC PANEL
AST: 16 U/L (ref 10–35)
Albumin: 3.9 g/dL (ref 3.6–5.1)
CO2: 24 mmol/L (ref 20–31)
Creat: 0.86 mg/dL (ref 0.70–1.33)
Sodium: 139 mmol/L (ref 135–146)
Total Bilirubin: 0.5 mg/dL (ref 0.2–1.2)
Total Protein: 6.6 g/dL (ref 6.1–8.1)

## 2015-10-26 LAB — LDL CHOLESTEROL, DIRECT: Direct LDL: 89 mg/dL (ref <130)

## 2015-12-08 ENCOUNTER — Emergency Department
Admission: EM | Admit: 2015-12-08 | Discharge: 2015-12-08 | Disposition: A | Discharge status: Home / Self Care | Payer: Managed Care, Other (non HMO) | Source: Home / Self Care | Attending: Family Medicine | Admitting: Family Medicine

## 2015-12-08 ENCOUNTER — Encounter: Payer: Self-pay | Admitting: Emergency Medicine

## 2015-12-08 DIAGNOSIS — J019 Acute sinusitis, unspecified: Secondary | ICD-10-CM | POA: Diagnosis not present

## 2015-12-08 MED ORDER — BENZONATATE 100 MG PO CAPS: 100.0000 mg | ORAL_CAPSULE | Freq: Three times a day (TID) | ORAL | Status: DC

## 2015-12-08 MED ORDER — DOXYCYCLINE HYCLATE 100 MG PO CAPS: 100.0000 mg | ORAL_CAPSULE | Freq: Two times a day (BID) | ORAL | Status: DC

## 2015-12-08 NOTE — ED Provider Notes (Signed)
CSN: [id number]     Arrival date & time 12/08/15  0807 History   First MD Initiated Contact with Patient 12/08/15 [phone]     Chief Complaint  Patient presents with  . Sinus Problem   (Consider location/radiation/quality/duration/timing/severity/associated sxs/prior Treatment) HPI  The pt is a 55yo male presenting to [facility] with c/o 2 weeks of gradually worsening sinus congestion and pain with bilateral ear pressure, sore throat with hoarse voice and mild intermittent non-productive cough.  Denies fever, chills, n/v/d. No sick contacts or recent travel. Pt states he was seen last month by his PCP, Dr. Dianah Field for similar symptoms and was prescribed Augmentin and Flonase. He completed the antibiotics but is still using the Flonase w/o relief. He has also been using OTC Alkaseltzer with no relief. States he gets sick like this every year around this time.   Past Medical History  Diagnosis Date  . Arthritis   . Reflux esophagitis   . GERD (gastroesophageal reflux disease)   . Diverticulitis   . High triglycerides    Past Surgical History  Procedure Laterality Date  . Feet surgery    . Bilateral feet reconstruction  10/2000   Family History  Problem Relation Age of Onset  . Hypertension Mother   . Hypertension Father    Social History  Substance Use Topics  . Smoking status: Former Smoker    Quit date: 02/07/1991  . Smokeless tobacco: Former Systems developer    Quit date: 08/14/2009  . Alcohol Use: No    Review of Systems  Constitutional: Negative for fever and chills.  HENT: Positive for congestion, ear pain ( bilateral pressure), postnasal drip, rhinorrhea, sinus pressure, sore throat and voice change. Negative for trouble swallowing.   Respiratory: Positive for cough and shortness of breath.   Cardiovascular: Negative for chest pain and palpitations.  Gastrointestinal: Negative for nausea, vomiting, abdominal pain and diarrhea.  Musculoskeletal: Negative for myalgias, back pain and  arthralgias.  Skin: Negative for rash.  Neurological: Positive for headaches. Negative for dizziness and light-headedness.    Allergies  Review of patient's allergies indicates no known allergies.  Home Medications   Prior to Admission medications   Medication Sig Start Date End Date Taking? Authorizing Provider  amoxicillin-clavulanate (AUGMENTIN) 875-125 MG tablet Take 1 tablet by mouth 2 (two) times daily. 10/25/15   Silverio Decamp, MD  atorvastatin (LIPITOR) 40 MG tablet Take 1 tablet (40 mg total) by mouth daily. 12/21/14   Silverio Decamp, MD  Azelastine-Fluticasone [phone] MCG/ACT SUSP One spray each nostril BID 01/21/15   Silverio Decamp, MD  benzonatate (TESSALON) 100 MG capsule Take 1-2 capsules (100-200 mg total) by mouth every 8 (eight) hours. 12/08/15   Noland Fordyce, PA-C  doxycycline (VIBRAMYCIN) 100 MG capsule Take 1 capsule (100 mg total) by mouth 2 (two) times daily. One po bid x 7 days 12/08/15   Noland Fordyce, PA-C  esomeprazole (NEXIUM) 40 MG capsule One tab by mouth at dinner time. 12/21/14   Silverio Decamp, MD  etanercept (ENBREL) 50 MG/ML injection Inject 50 mg into the skin once a week.    Historical Provider, MD  fenofibrate (TRICOR) 145 MG tablet Take 1 tablet (145 mg total) by mouth daily. 12/21/14   Silverio Decamp, MD  lisinopril-hydrochlorothiazide (PRINZIDE,ZESTORETIC) 20-25 MG per tablet Take 1 tablet by mouth daily. 12/21/14   Silverio Decamp, MD  loratadine (CLARITIN) 10 MG tablet Take 1 tablet (10 mg total) by mouth daily. 01/21/15   Silverio Decamp,  MD  methotrexate (RHEUMATREX) 2.5 MG tablet Take 2.5 mg by mouth once a week. Caution:Chemotherapy. Protect from light.     Historical Provider, MD   Meds Ordered and Administered this Visit  Medications - No data to display  BP 130/85 mmHg  Pulse 68  Temp(Src) 97.6 F (36.4 C) (Oral)  Ht 6\' 2"  (1.88 m)  Wt 190 lb (86.183 kg)  BMI 24.38 kg/m2  SpO2 97% No data  found.   Physical Exam  Constitutional: He appears well-developed and well-nourished.  HENT:  Head: Normocephalic and atraumatic.  Right Ear: Tympanic membrane normal.  Left Ear: Tympanic membrane normal.  Nose: Mucosal edema and rhinorrhea present. Right sinus exhibits maxillary sinus tenderness and frontal sinus tenderness. Left sinus exhibits maxillary sinus tenderness and frontal sinus tenderness.  Mouth/Throat: Uvula is midline and mucous membranes are normal. Posterior oropharyngeal erythema present. No oropharyngeal exudate, posterior oropharyngeal edema or tonsillar abscesses.  Eyes: Conjunctivae are normal. No scleral icterus.  Neck: Normal range of motion. Neck supple.  Hoarse voice but no stridor  Cardiovascular: Normal rate, regular rhythm and normal heart sounds.   Pulmonary/Chest: Effort normal and breath sounds normal. No stridor. No respiratory distress. He has no wheezes. He has no rales. He exhibits no tenderness.  Abdominal: Soft. He exhibits no distension. There is no tenderness.  Musculoskeletal: Normal range of motion.  Lymphadenopathy:    He has no cervical adenopathy.  Neurological: He is alert.  Skin: Skin is warm and dry.  Nursing note and vitals reviewed.   ED Course  Procedures (including critical care time)  Labs Review Labs Reviewed - No data to display  Imaging Review No results found.    MDM   1. Acute rhinosinusitis    Pt c/o 2 weeks of worsening sinus pain, pressure and congestion. He was on Augmentin about 1 month ago for similar symptoms that resolved but then came back.  Will treat for bacterial sinusitis and change antibiotic to Doxycycline. Encouraged to continue Flonase. Encouraged fluids, rest, acetaminophen and ibuprofen for pain. Sinus rinses.  F/u with PCP in 7-10 days if not improving, sooner if worsening. Patient verbalized understanding and agreement with treatment plan.     Noland Fordyce, PA-C 12/08/15 [phone]

## 2015-12-08 NOTE — Discharge Instructions (Signed)
You may take 400-600mg  Ibuprofen (Motrin) every 6-8 hours for fever and pain  Alternate with Tylenol  You may take 500mg  Tylenol every 4-6 hours as needed for fever and pain  Follow-up with your primary care provider next week for recheck of symptoms if not improving.  Be sure to drink plenty of fluids and rest, at least 8hrs of sleep a night, preferably more while you are sick. Return urgent care or go to closest ER if you cannot keep down fluids/signs of dehydration, fever not reducing with Tylenol, difficulty breathing/wheezing, stiff neck, worsening condition, or other concerns (see below)  Please take antibiotics as prescribed and be sure to complete entire course even if you start to feel better to ensure infection does not come back.   Sinus Rinse WHAT IS A SINUS RINSE? A sinus rinse is a simple home treatment that is used to rinse your sinuses with a sterile mixture of salt and water (saline solution). Sinuses are air-filled spaces in your skull behind the bones of your face and forehead that open into your nasal cavity. You will use the following:  Saline solution.  Neti pot or spray bottle. This releases the saline solution into your nose and through your sinuses. Neti pots and spray bottles can be purchased at [employer], a health food store, or online. WHEN WOULD I DO A SINUS RINSE? A sinus rinse can help to clear mucus, dirt, dust, or pollen from the nasal cavity. You may do a sinus rinse when you have a cold, a virus, nasal allergy symptoms, a sinus infection, or stuffiness in the nose or sinuses. If you are considering a sinus rinse:  Ask your child's health care provider before performing a sinus rinse on your child.  Do not do a sinus rinse if you have had ear or nasal surgery, ear infection, or blocked ears. HOW DO I DO A SINUS RINSE?  Wash your hands.  Disinfect your device according to the directions provided and then dry it.  Use the solution that comes  with your device or one that is sold separately in stores. Follow the mixing directions on the package.  Fill your device with the amount of saline solution as directed by the device instructions.  Stand over a sink and tilt your head sideways over the sink.  Place the spout of the device in your upper nostril (the one closer to the ceiling).  Gently pour or squeeze the saline solution into the nasal cavity. The liquid should drain to the lower nostril if you are not overly congested.  Gently blow your nose. Blowing too hard may cause ear pain.  Repeat in the other nostril.  Clean and rinse your device with clean water and then air-dry it. ARE THERE RISKS OF A SINUS RINSE?  Sinus rinse is generally very safe and effective. However, there are a few risks, which include:   A burning sensation in the sinuses. This may happen if you do not make the saline solution as directed. Make sure to follow all directions when making the saline solution.  Infection from contaminated water. This is rare, but possible.  Nasal irritation.   This information is not intended to replace advice given to you by your health care provider. Make sure you discuss any questions you have with your health care provider.   Document Released: 04/22/2014 Document Reviewed: 04/22/2014 Elsevier Interactive Patient Education 2016 [employer].  Sinusitis, Adult Sinusitis is redness, soreness, and inflammation of the paranasal sinuses.  Paranasal sinuses are air pockets within the bones of your face. They are located beneath your eyes, in the middle of your forehead, and above your eyes. In healthy paranasal sinuses, mucus is able to drain out, and air is able to circulate through them by way of your nose. However, when your paranasal sinuses are inflamed, mucus and air can become trapped. This can allow bacteria and other germs to grow and cause infection. Sinusitis can develop quickly and last only a short time (acute)  or continue over a long period (chronic). Sinusitis that lasts for more than 12 weeks is considered chronic. CAUSES Causes of sinusitis include:  Allergies.  Structural abnormalities, such as displacement of the cartilage that separates your nostrils (deviated septum), which can decrease the air flow through your nose and sinuses and affect sinus drainage.  Functional abnormalities, such as when the small hairs (cilia) that line your sinuses and help remove mucus do not work properly or are not present. SIGNS AND SYMPTOMS Symptoms of acute and chronic sinusitis are the same. The primary symptoms are pain and pressure around the affected sinuses. Other symptoms include:  Upper toothache.  Earache.  Headache.  Bad breath.  Decreased sense of smell and taste.  A cough, which worsens when you are lying flat.  Fatigue.  Fever.  Thick drainage from your nose, which often is green and may contain pus (purulent).  Swelling and warmth over the affected sinuses. DIAGNOSIS Your health care provider will perform a physical exam. During your exam, your health care provider may perform any of the following to help determine if you have acute sinusitis or chronic sinusitis:  Look in your nose for signs of abnormal growths in your nostrils (nasal polyps).  Tap over the affected sinus to check for signs of infection.  View the inside of your sinuses using an imaging device that has a light attached (endoscope). If your health care provider suspects that you have chronic sinusitis, one or more of the following tests may be recommended:  Allergy tests.  Nasal culture. A sample of mucus is taken from your nose, sent to a lab, and screened for bacteria.  Nasal cytology. A sample of mucus is taken from your nose and examined by your health care provider to determine if your sinusitis is related to an allergy. TREATMENT Most cases of acute sinusitis are related to a viral infection and will  resolve on their own within 10 days. Sometimes, medicines are prescribed to help relieve symptoms of both acute and chronic sinusitis. These may include pain medicines, decongestants, nasal steroid sprays, or saline sprays. However, for sinusitis related to a bacterial infection, your health care provider will prescribe antibiotic medicines. These are medicines that will help kill the bacteria causing the infection. Rarely, sinusitis is caused by a fungal infection. In these cases, your health care provider will prescribe antifungal medicine. For some cases of chronic sinusitis, surgery is needed. Generally, these are cases in which sinusitis recurs more than 3 times per year, despite other treatments. HOME CARE INSTRUCTIONS  Drink plenty of water. Water helps thin the mucus so your sinuses can drain more easily.  Use a humidifier.  Inhale steam 3-4 times a day (for example, sit in the bathroom with the shower running).  Apply a warm, moist washcloth to your face 3-4 times a day, or as directed by your health care provider.  Use saline nasal sprays to help moisten and clean your sinuses.  Take medicines only as directed  by your health care provider.  If you were prescribed either an antibiotic or antifungal medicine, finish it all even if you start to feel better. SEEK IMMEDIATE MEDICAL CARE IF:  You have increasing pain or severe headaches.  You have nausea, vomiting, or drowsiness.  You have swelling around your face.  You have vision problems.  You have a stiff neck.  You have difficulty breathing.   This information is not intended to replace advice given to you by your health care provider. Make sure you discuss any questions you have with your health care provider.   Document Released: 09/25/2005 Document Revised: 10/16/2014 Document Reviewed: 10/10/2011 Elsevier Interactive Patient Education [employer].

## 2015-12-08 NOTE — ED Notes (Signed)
Congestion, sinus pain, pressure behind eyes, hoarseness, cough, headache x 2 weeks

## 2015-12-11 ENCOUNTER — Telehealth: Payer: Self-pay | Admitting: Emergency Medicine

## 2015-12-11 NOTE — ED Notes (Signed)
Inquired about patient's status; encourage them to call with questions/concerns.  

## 2015-12-17 ENCOUNTER — Other Ambulatory Visit: Payer: Self-pay | Admitting: Sports Medicine

## 2016-04-05 ENCOUNTER — Other Ambulatory Visit: Payer: Self-pay | Admitting: Sports Medicine

## 2016-04-24 ENCOUNTER — Ambulatory Visit: Payer: Managed Care, Other (non HMO) | Admitting: Sports Medicine

## 2016-05-01 ENCOUNTER — Encounter: Payer: Self-pay | Admitting: Sports Medicine

## 2016-05-01 ENCOUNTER — Ambulatory Visit (INDEPENDENT_AMBULATORY_CARE_PROVIDER_SITE_OTHER): Payer: Managed Care, Other (non HMO) | Admitting: Sports Medicine

## 2016-05-01 DIAGNOSIS — E782 Mixed hyperlipidemia: Secondary | ICD-10-CM

## 2016-05-01 DIAGNOSIS — Z0000 Encounter for general adult medical examination without abnormal findings: Secondary | ICD-10-CM | POA: Insufficient documentation

## 2016-05-01 DIAGNOSIS — I10 Essential (primary) hypertension: Secondary | ICD-10-CM | POA: Diagnosis not present

## 2016-05-01 NOTE — Assessment & Plan Note (Signed)
Checking routine blood work including hepatitis C and HIV screening

## 2016-05-01 NOTE — Progress Notes (Signed)
  Subjective:    CC: Follow-up  HPI: Hypertension: Stable  Preventive measures: Due for hepatitis C and HIV screening  Hyperlipidemia: Has done relatively well on fenofibrate and atorvastatin, would like to get another lipid panel before considering adding an additional medication for his triglycerides.  Past medical history, Surgical history, Family history not pertinant except as noted below, Social history, Allergies, and medications have been entered into the medical record, reviewed, and no changes needed.   Review of Systems: No fevers, chills, night sweats, weight loss, chest pain, or shortness of breath.   Objective:    General: Well Developed, well nourished, and in no acute distress.  Neuro: Alert and oriented x3, extra-ocular muscles intact, sensation grossly intact.  HEENT: Normocephalic, atraumatic, pupils equal round reactive to light, neck supple, no masses, no lymphadenopathy, thyroid nonpalpable.  Skin: Warm and dry, no rashes. Cardiac: Regular rate and rhythm, no murmurs rubs or gallops, no lower extremity edema.  Respiratory: Clear to auscultation bilaterally. Not using accessory muscles, speaking in full sentences.  Impression and Recommendations:    I spent 25 minutes with this patient, greater than 50% was face-to-face time counseling regarding the above diagnoses

## 2016-05-01 NOTE — Assessment & Plan Note (Signed)
Well controlled, no changes 

## 2016-05-01 NOTE — Assessment & Plan Note (Addendum)
Rechecking lipids. Currently on fenofibrate and atorvastatin, we may have to add Vascepa if still elevated.  Increase fenofibrate to 160 mg, adding Lovaza.

## 2016-05-24 ENCOUNTER — Other Ambulatory Visit: Payer: Self-pay

## 2016-05-24 DIAGNOSIS — E785 Hyperlipidemia, unspecified: Secondary | ICD-10-CM

## 2016-05-24 DIAGNOSIS — J301 Allergic rhinitis due to pollen: Secondary | ICD-10-CM

## 2016-05-24 MED ORDER — FENOFIBRATE 145 MG PO TABS: 145.0000 mg | ORAL_TABLET | Freq: Every day | ORAL | 0 refills | Status: DC

## 2016-05-24 MED ORDER — LORATADINE 10 MG PO TABS: 10.0000 mg | ORAL_TABLET | Freq: Every day | ORAL | 3 refills | Status: DC

## 2016-05-24 MED ORDER — ATORVASTATIN CALCIUM 40 MG PO TABS: 40.0000 mg | ORAL_TABLET | Freq: Every day | ORAL | 0 refills | Status: DC

## 2016-05-30 ENCOUNTER — Other Ambulatory Visit: Payer: Self-pay | Admitting: Sports Medicine

## 2016-05-30 DIAGNOSIS — J301 Allergic rhinitis due to pollen: Secondary | ICD-10-CM

## 2016-06-21 ENCOUNTER — Other Ambulatory Visit: Payer: Self-pay | Admitting: Sports Medicine

## 2016-09-19 ENCOUNTER — Other Ambulatory Visit: Payer: Self-pay | Admitting: Sports Medicine

## 2016-09-19 DIAGNOSIS — E785 Hyperlipidemia, unspecified: Secondary | ICD-10-CM

## 2016-09-25 LAB — CBC
HCT: 42.6 % (ref 38.5–50.0)
Hemoglobin: 14.3 g/dL (ref 13.2–17.1)
MCH: 30.8 pg (ref 27.0–33.0)
MCHC: 33.6 g/dL (ref 32.0–36.0)
MCV: 91.6 fL (ref 80.0–100.0)
MPV: 10 fL (ref 7.5–12.5)
Platelets: 317 K/uL (ref 140–400)
RBC: 4.65 MIL/uL (ref 4.20–5.80)
RDW: 14.3 % (ref 11.0–15.0)
WBC: 8.2 K/uL (ref 3.8–10.8)

## 2016-09-26 LAB — COMPREHENSIVE METABOLIC PANEL
ALT: 11 U/L (ref 9–46)
Albumin: 3.8 g/dL (ref 3.6–5.1)
Alkaline Phosphatase: 78 U/L (ref 40–115)
Chloride: 107 mmol/L (ref 98–110)
Creat: 0.82 mg/dL (ref 0.70–1.33)
Potassium: 4.4 mmol/L (ref 3.5–5.3)
Total Protein: 6.6 g/dL (ref 6.1–8.1)

## 2016-09-26 LAB — COMPREHENSIVE METABOLIC PANEL WITH GFR
AST: 14 U/L (ref 10–35)
BUN: 16 mg/dL (ref 7–25)
CO2: 23 mmol/L (ref 20–31)
Calcium: 9.2 mg/dL (ref 8.6–10.3)
Glucose, Bld: 96 mg/dL (ref 65–99)
Sodium: 140 mmol/L (ref 135–146)
Total Bilirubin: 0.4 mg/dL (ref 0.2–1.2)

## 2016-09-26 LAB — LIPID PANEL
Cholesterol: 171 mg/dL (ref <200)
HDL: 30 mg/dL — ABNORMAL LOW (ref >40)
LDL Cholesterol: 85 mg/dL (ref <100)
Total CHOL/HDL Ratio: 5.7 Ratio — ABNORMAL HIGH (ref <5.0)
Triglycerides: 281 mg/dL — ABNORMAL HIGH (ref <150)
VLDL: 56 mg/dL — ABNORMAL HIGH (ref <30)

## 2016-09-26 LAB — HEMOGLOBIN A1C
Hgb A1c MFr Bld: 5.3 % (ref <5.7)
Mean Plasma Glucose: 105 mg/dL

## 2016-09-26 LAB — VITAMIN D 25 HYDROXY (VIT D DEFICIENCY, FRACTURES): Vit D, 25-Hydroxy: 16 ng/mL — ABNORMAL LOW (ref 30–100)

## 2016-09-26 LAB — HIV ANTIBODY (ROUTINE TESTING W REFLEX): HIV 1&2 Ab, 4th Generation: NONREACTIVE

## 2016-09-26 LAB — HEPATITIS C ANTIBODY: HCV Ab: NEGATIVE

## 2016-09-26 MED ORDER — VITAMIN D (ERGOCALCIFEROL) 1.25 MG (50000 UNIT) PO CAPS: 50000.0000 [IU] | ORAL_CAPSULE | ORAL | 0 refills | Status: DC

## 2016-09-26 MED ORDER — OMEGA-3-ACID ETHYL ESTERS 1 G PO CAPS: 2.0000 g | ORAL_CAPSULE | Freq: Two times a day (BID) | ORAL | 3 refills | Status: DC

## 2016-09-26 MED ORDER — FENOFIBRATE 160 MG PO TABS: 160.0000 mg | ORAL_TABLET | Freq: Every day | ORAL | 3 refills | Status: DC

## 2016-09-26 NOTE — Addendum Note (Signed)
Addended by: Silverio Decamp on: 09/26/2016 11:04 AM   Modules accepted: Orders

## 2016-11-02 ENCOUNTER — Encounter: Payer: Self-pay | Admitting: Emergency Medicine

## 2016-11-02 ENCOUNTER — Emergency Department (INDEPENDENT_AMBULATORY_CARE_PROVIDER_SITE_OTHER)
Admission: EM | Admit: 2016-11-02 | Discharge: 2016-11-02 | Disposition: A | Discharge status: Home / Self Care | Payer: BLUE CROSS/BLUE SHIELD | Source: Home / Self Care | Attending: Family Medicine | Admitting: Family Medicine

## 2016-11-02 DIAGNOSIS — R69 Illness, unspecified: Secondary | ICD-10-CM

## 2016-11-02 DIAGNOSIS — J111 Influenza due to unidentified influenza virus with other respiratory manifestations: Secondary | ICD-10-CM

## 2016-11-02 MED ORDER — ALBUTEROL SULFATE HFA 108 (90 BASE) MCG/ACT IN AERS: 2.0000 | INHALATION_SPRAY | RESPIRATORY_TRACT | 0 refills | Status: DC | PRN

## 2016-11-02 MED ORDER — OSELTAMIVIR PHOSPHATE 75 MG PO CAPS: 75.0000 mg | ORAL_CAPSULE | Freq: Two times a day (BID) | ORAL | 0 refills | Status: DC

## 2016-11-02 MED ORDER — AZITHROMYCIN 250 MG PO TABS: ORAL_TABLET | ORAL | 0 refills | Status: DC

## 2016-11-02 MED ORDER — IPRATROPIUM-ALBUTEROL 0.5-2.5 (3) MG/3ML IN SOLN
3.0000 mL | Freq: Four times a day (QID) | RESPIRATORY_TRACT | Status: DC
  Administered 2016-11-02: 3 mL via RESPIRATORY_TRACT

## 2016-11-02 NOTE — ED Triage Notes (Signed)
Patient reports not feeling well all week but noticed more tightness in his chest and some shortness of breath today; no subjective fever; has cough; took alka-seltzer cough/cold today and some tylenol.

## 2016-11-02 NOTE — Discharge Instructions (Signed)
Take plain guaifenesin (1200mg  extended release tabs such as Mucinex) twice daily, with plenty of water, for cough and congestion.  Get adequate rest.   May use Afrin nasal spray (or generic oxymetazoline) twice daily for about 5 days and then discontinue.  Also recommend using saline nasal spray several times daily and saline nasal irrigation (AYR is a common brand).  Use Flonase nasal spray each morning after using Afrin nasal spray and saline nasal irrigation. Try warm salt water gargles for sore throat.  Stop all antihistamines for now, and other non-prescription cough/cold preparations. May take Delsym Cough Suppressant at bedtime for nighttime cough.

## 2016-11-02 NOTE — ED Provider Notes (Signed)
[facility]    CSN: [id number] Arrival date & time: 11/02/16  [id number]     History   Chief Complaint Chief Complaint  Patient presents with  . Cough  . Nasal Congestion  . Shortness of Breath    HPI Shakiem Filipek is a 56 y.o. male.   Patient begin developing myalgias two days ago, then yesterday developed chills, cough, and tightness in his anterior chest.  He has developed chills, shortness of breath with activity and occasional wheezing. He has rheumatoid arthritis controlled with Enbrel, methotrexate, and Xeljanz.   The history is provided by the patient.    Past Medical History:  Diagnosis Date  . Arthritis   . Diverticulitis   . GERD (gastroesophageal reflux disease)   . High triglycerides   . Reflux esophagitis     Patient Active Problem List   Diagnosis Date Noted  . Annual physical exam 05/01/2016  . Allergic rhinitis 01/21/2015  . 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 Hyperlipidemia, predominantly triglycerides 10/24/2012  . Gastric outlet obstruction 12/21/2011  . Hypertension 08/15/2011  . Rheumatoid arthritis ([place]) 10/14/2010    Past Surgical History:  Procedure Laterality Date  . bilateral feet reconstruction  10/2000  . feet surgery         Home Medications    Prior to Admission medications   Medication Sig Start Date End Date Taking? Authorizing Provider  albuterol (PROVENTIL HFA;VENTOLIN HFA) 108 (90 Base) MCG/ACT inhaler Inhale 2 puffs into the lungs every 4 (four) hours as needed for wheezing or shortness of breath. 11/02/16   Kandra Nicolas, MD  atorvastatin (LIPITOR) 40 MG tablet TAKE 1 TABLET BY MOUTH DAILY 09/19/16   Silverio Decamp, MD  Azelastine-Fluticasone [phone] MCG/ACT SUSP One spray each nostril BID 01/21/15   Silverio Decamp, MD  azithromycin (ZITHROMAX Z-PAK) 250 MG tablet Take 2 tabs today; then begin one tab once daily for 4 more days. 11/02/16   Kandra Nicolas, MD  esomeprazole (NEXIUM)  40 MG capsule TAKE 1 CAPSULE BY MOUTH AT [place] TIME 09/19/16   Silverio Decamp, MD  etanercept (ENBREL) 50 MG/ML injection Inject 50 mg into the skin once a week.    Historical Provider, MD  fenofibrate 160 MG tablet Take 1 tablet (160 mg total) by mouth daily. 09/26/16   Silverio Decamp, MD  lisinopril-hydrochlorothiazide (PRINZIDE,ZESTORETIC) 20-25 MG tablet TAKE 1 TABLET BY MOUTH DAILY 04/05/16   Silverio Decamp, MD  loratadine (CLARITIN) 10 MG tablet Take 1 tablet (10 mg total) by mouth daily. 05/24/16   Silverio Decamp, MD  loratadine (CLARITIN) 10 MG tablet TAKE ONE TABLET BY MOUTH ONCE DAILY 05/30/16   Silverio Decamp, MD  methotrexate (RHEUMATREX) 2.5 MG tablet Take 2.5 mg by mouth once a week. Caution:Chemotherapy. Protect from light.     Historical Provider, MD  omega-3 acid ethyl esters (LOVAZA) 1 g capsule Take 2 capsules (2 g total) by mouth 2 (two) times daily. 09/26/16   Silverio Decamp, MD  oseltamivir (TAMIFLU) 75 MG capsule Take 1 capsule (75 mg total) by mouth every 12 (twelve) hours. 11/02/16   Kandra Nicolas, MD  predniSONE (DELTASONE) 5 MG tablet  04/13/16   Historical Provider, MD  Vitamin D, Ergocalciferol, (DRISDOL) 50000 units CAPS capsule Take 1 capsule (50,000 Units total) by mouth every 7 (seven) days. Take for 8 total doses(weeks) 09/26/16   Silverio Decamp, MD  XELJANZ XR 11 MG TB24  03/27/16   Historical Provider,  MD    Family History Family History  Problem Relation Age of Onset  . Hypertension Mother   . Hypertension Father     Social History Social History  Substance Use Topics  . Smoking status: Former Smoker    Quit date: 02/07/1991  . Smokeless tobacco: Former Systems developer    Quit date: 08/14/2009  . Alcohol use No     Allergies   Patient has no known allergies.   Review of Systems Review of Systems No sore throat + cough No pleuritic pain + wheezing No nasal congestion No post-nasal drainage No sinus  pain/pressure No itchy/red eyes No earache No hemoptysis + SOB No fever, + chills No nausea No vomiting No abdominal pain No diarrhea No urinary symptoms No skin rash + fatigue No myalgias No headache Used OTC meds without relief   Physical Exam Triage Vital Signs ED Triage Vitals  Enc Vitals Group     BP 11/02/16 1134 126/76     Pulse Rate 11/02/16 1134 84     Resp 11/02/16 1134 18     Temp 11/02/16 1134 97.4 F (36.3 C)     Temp Source 11/02/16 1134 Tympanic     SpO2 11/02/16 1134 97 %     Weight 11/02/16 1135 210 lb (95.3 kg)     Height 11/02/16 1135 6\' 1"  (1.854 m)     Head Circumference --      Peak Flow --      Pain Score 11/02/16 1136 0     Pain Loc --      Pain Edu? --      Excl. in [place]? --    No data found.   Updated Vital Signs BP 126/76 (BP Location: Left Arm)   Pulse 84   Temp 97.4 F (36.3 C) (Tympanic)   Resp 18   Ht 6\' 1"  (1.854 m)   Wt 210 lb (95.3 kg)   SpO2 97%   BMI 27.71 kg/m   Visual Acuity Right Eye Distance:   Left Eye Distance:   Bilateral Distance:    Right Eye Near:   Left Eye Near:    Bilateral Near:     Physical Exam Nursing notes and Vital Signs reviewed. Appearance:  Patient appears stated age, and in no acute distress Eyes:  Pupils are equal, round, and reactive to light and accomodation.  Extraocular movement is intact.  Conjunctivae are not inflamed  Ears:  Canals normal.  Tympanic membranes normal.  Nose:  Mildly congested turbinates.  No sinus tenderness.    Pharynx:  Normal Neck:  Supple.  Tender enlarged posterior/lateral nodes are palpated bilaterally  Lungs:   Course breath sounds bilaterally.  Breath sounds are equal.  Moving air well. Heart:  Regular rate and rhythm without murmurs, rubs, or gallops.  Abdomen:  Nontender without masses or hepatosplenomegaly.  Bowel sounds are present.  No CVA or flank tenderness.  Extremities:  No edema.  Skin:  No rash present.    UC Treatments / Results  Labs (all  labs ordered are listed, but only abnormal results are displayed) Labs Reviewed - No data to display  EKG  EKG Interpretation None       Radiology No results found.  Procedures Procedures (including critical care time)  Medications Ordered in UC Medications  ipratropium-albuterol (DUONEB) 0.5-2.5 (3) MG/3ML nebulizer solution 3 mL (not administered)     Initial Impression / Assessment and Plan / UC Course  I have reviewed the triage vital signs and  the nursing notes.  Pertinent labs & imaging results that were available during my care of the patient were reviewed by me and considered in my medical decision making (see chart for details).    Administered DuoNeb by hand held nebulizer  Begin Tamiflu. Because of possible immunosuppression, will begin empiric Z-pak Begin albuterol MDI. Take plain guaifenesin (1200mg  extended release tabs such as Mucinex) twice daily, with plenty of water, for cough and congestion.  Get adequate rest.   May use Afrin nasal spray (or generic oxymetazoline) twice daily for about 5 days and then discontinue.  Also recommend using saline nasal spray several times daily and saline nasal irrigation (AYR is a common brand).  Use Flonase nasal spray each morning after using Afrin nasal spray and saline nasal irrigation. Try warm salt water gargles for sore throat.  Stop all antihistamines for now, and other non-prescription cough/cold preparations. May take Delsym Cough Suppressant at bedtime for nighttime cough.  Followup with Family Doctor if not improved in one week.     Final Clinical Impressions(s) / UC Diagnoses   Final diagnoses:  Influenza-like illness    New Prescriptions New Prescriptions   ALBUTEROL (PROVENTIL HFA;VENTOLIN HFA) 108 (90 BASE) MCG/ACT INHALER    Inhale 2 puffs into the lungs every 4 (four) hours as needed for wheezing or shortness of breath.   AZITHROMYCIN (ZITHROMAX Z-PAK) 250 MG TABLET    Take 2 tabs today; then begin  one tab once daily for 4 more days.   OSELTAMIVIR (TAMIFLU) 75 MG CAPSULE    Take 1 capsule (75 mg total) by mouth every 12 (twelve) hours.     Kandra Nicolas, MD 11/05/16 1236

## 2016-12-25 IMAGING — CR DG CHEST 2V
2 series · 2 of 2 positions shown · non-contrast
Comparison: 12/31/2012

CLINICAL DATA: Cough for 1-1/2 months

EXAM:
CHEST  2 VIEW

[chest pa]
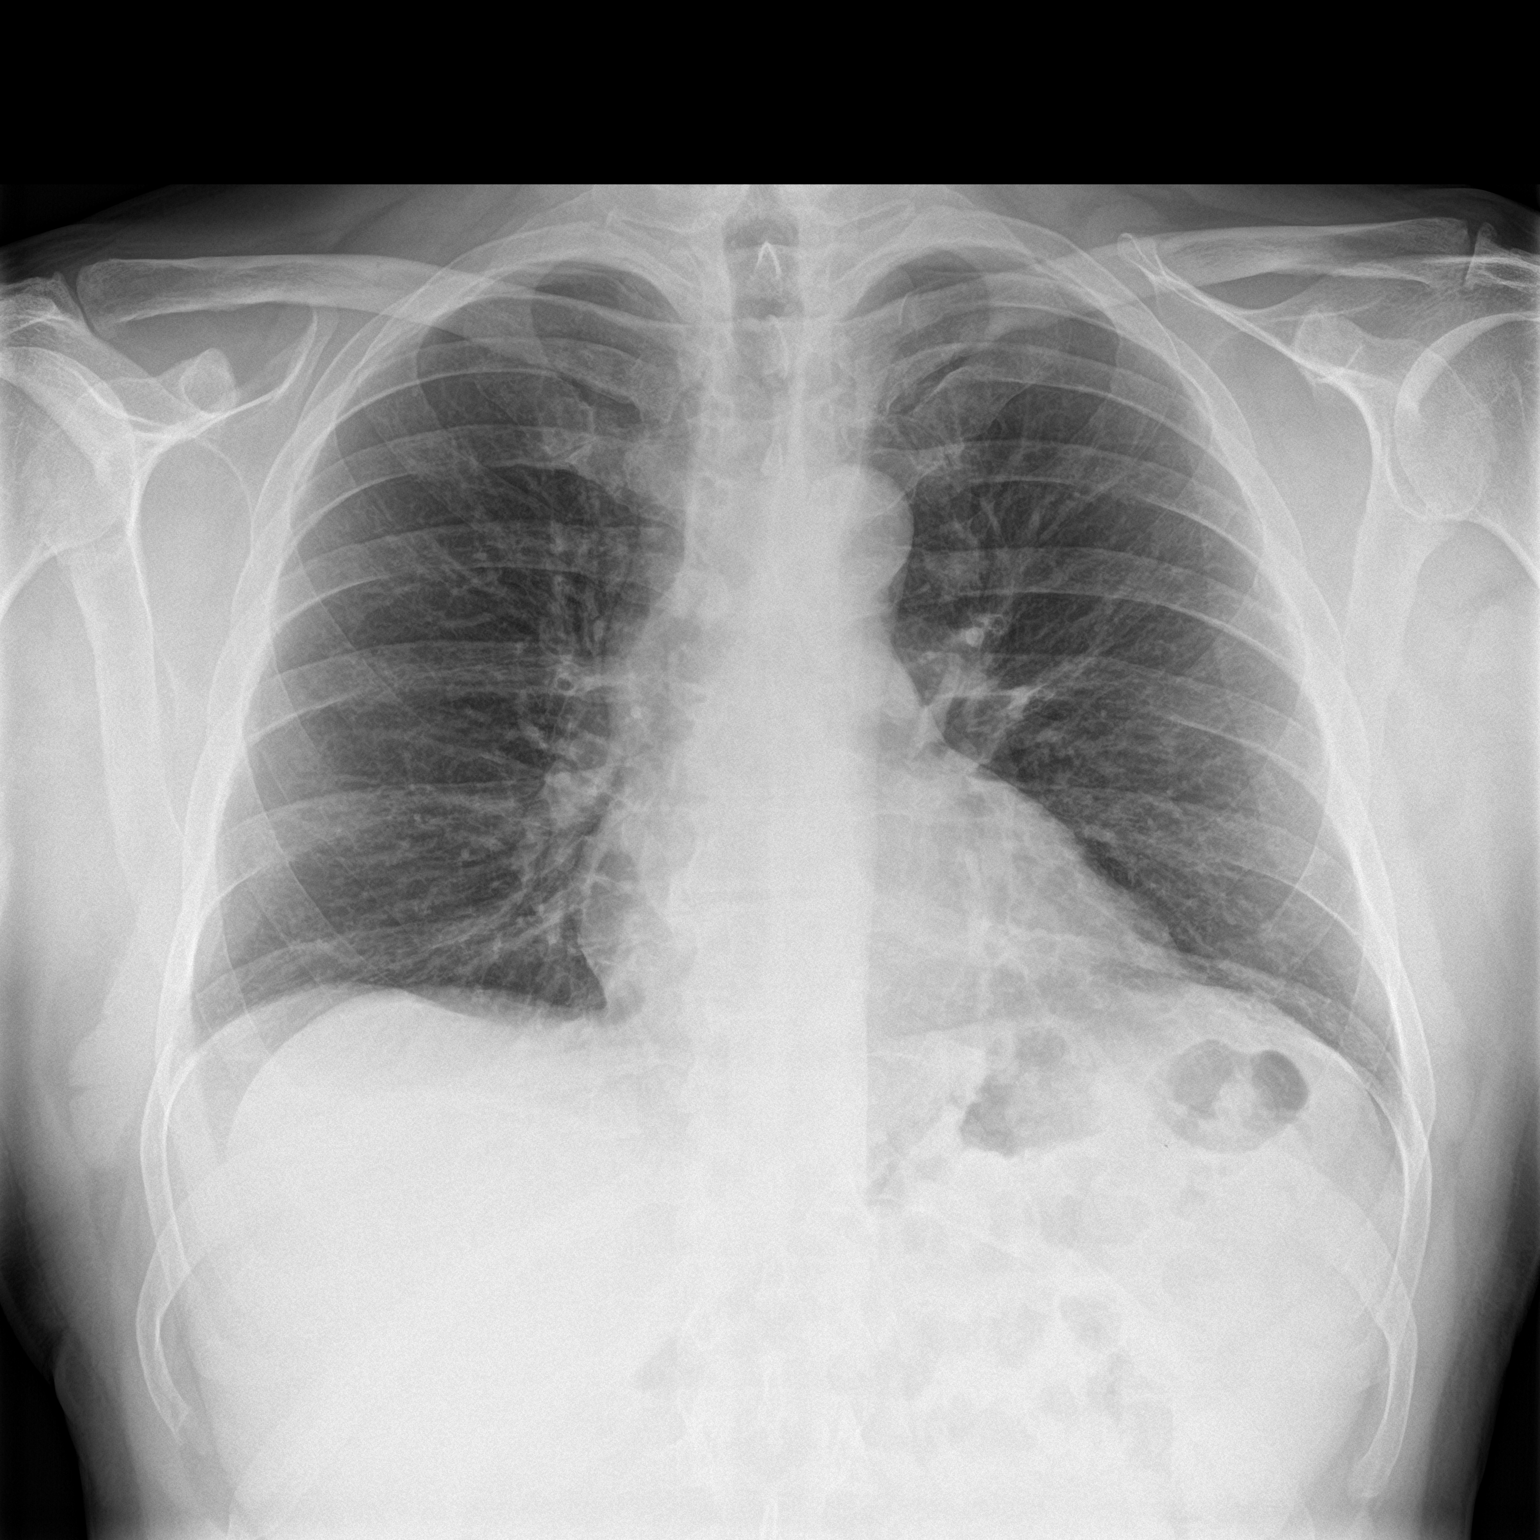

[chest lat]
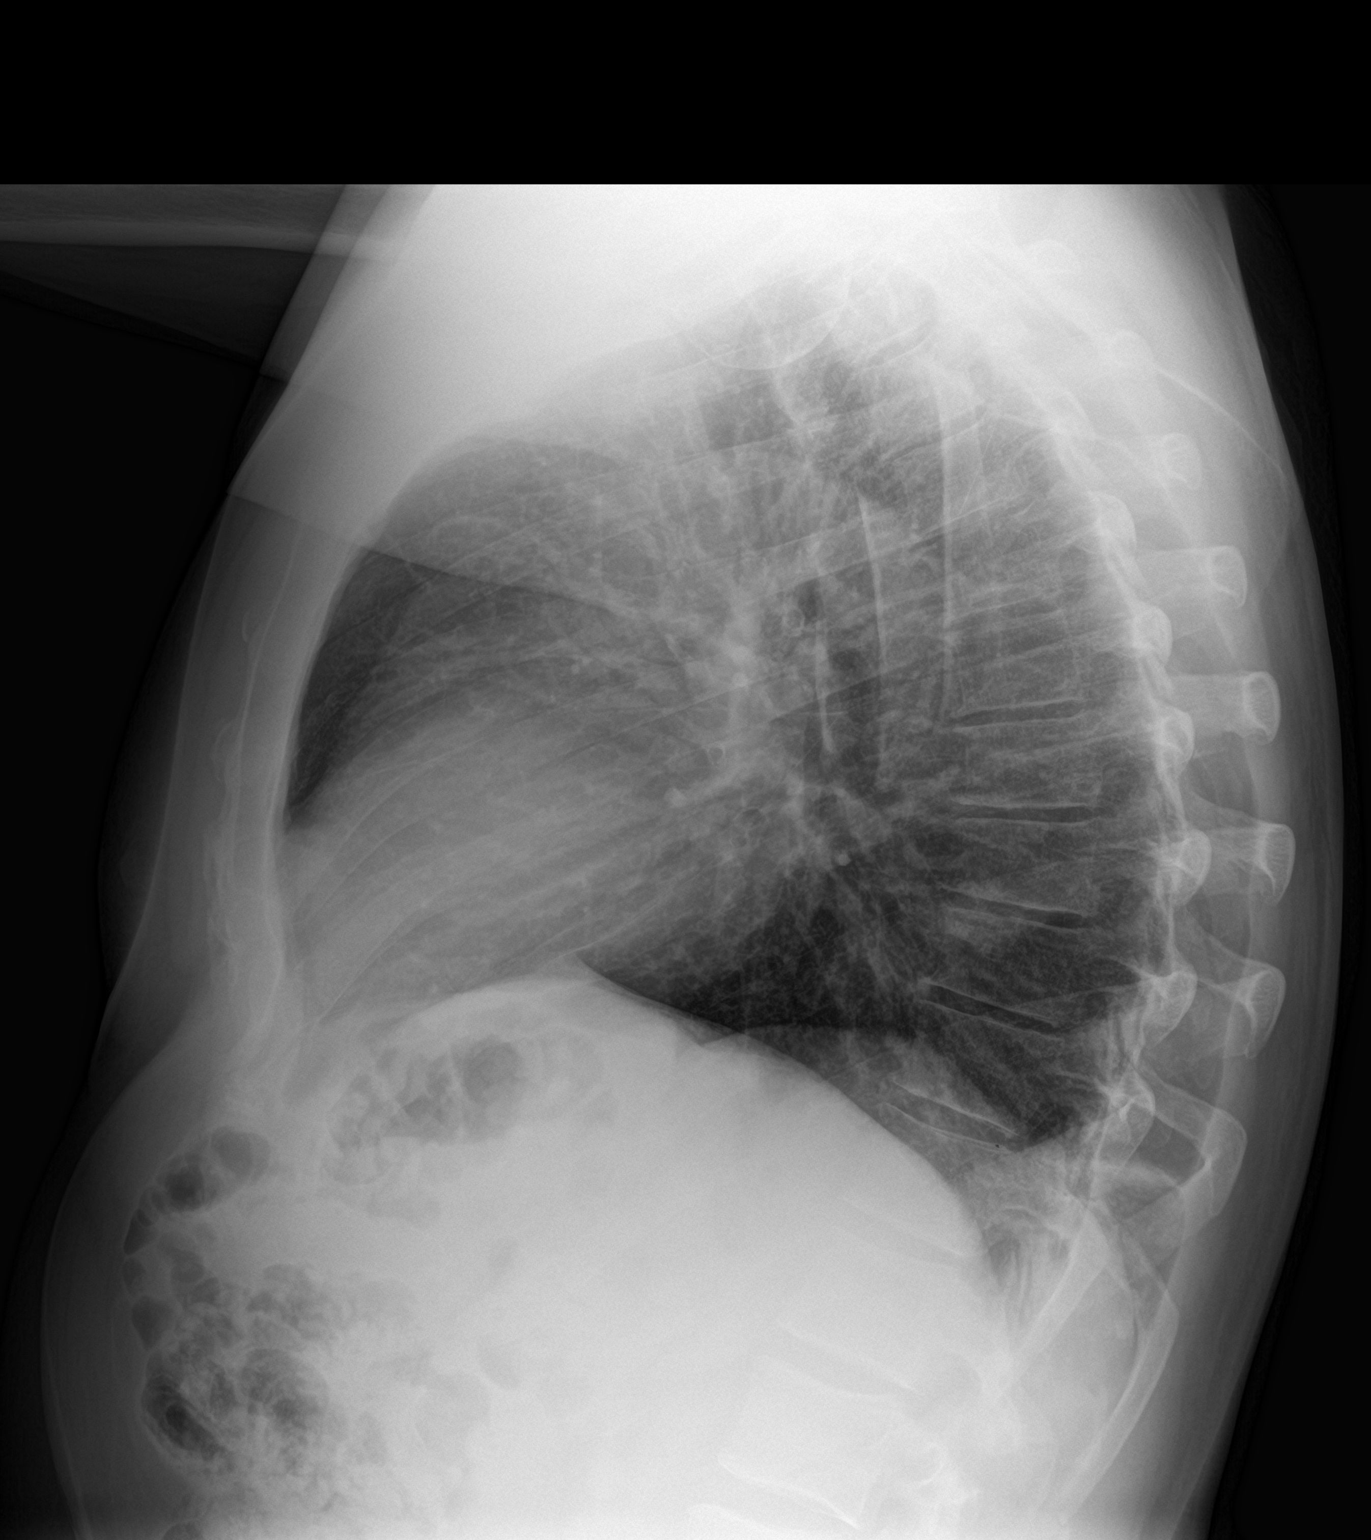

[2 of 2 positions shown; findings below may reference images not displayed]

FINDINGS: Cardiac shadow is within normal limits. The lungs are well aerated
bilaterally. No focal infiltrate or sizable effusion is seen. No
acute bony abnormality is noted.
IMPRESSION: No active cardiopulmonary disease.

## 2017-01-02 ENCOUNTER — Emergency Department (INDEPENDENT_AMBULATORY_CARE_PROVIDER_SITE_OTHER)
Admission: EM | Admit: 2017-01-02 | Discharge: 2017-01-02 | Disposition: A | Discharge status: Home / Self Care | Payer: BLUE CROSS/BLUE SHIELD | Source: Home / Self Care | Attending: Family Medicine | Admitting: Family Medicine

## 2017-01-02 ENCOUNTER — Encounter: Payer: Self-pay | Admitting: *Deleted

## 2017-01-02 DIAGNOSIS — L02413 Cutaneous abscess of right upper limb: Secondary | ICD-10-CM | POA: Diagnosis not present

## 2017-01-02 DIAGNOSIS — Z23 Encounter for immunization: Secondary | ICD-10-CM

## 2017-01-02 MED ORDER — CLINDAMYCIN HCL 300 MG PO CAPS: 300.0000 mg | ORAL_CAPSULE | Freq: Three times a day (TID) | ORAL | 0 refills | Status: DC

## 2017-01-02 MED ORDER — TETANUS-DIPHTH-ACELL PERTUSSIS 5-2.5-18.5 LF-MCG/0.5 IM SUSP
0.5000 mL | Freq: Once | INTRAMUSCULAR | Status: AC
  Administered 2017-01-02: 0.5 mL via INTRAMUSCULAR

## 2017-01-02 NOTE — ED Triage Notes (Signed)
Patient c/o possible spider bite to RFA x 4 days. Site is growing in size and redness. Denies any associated symptoms. Done Epsom salt soaks. Afebrile.

## 2017-01-02 NOTE — ED Provider Notes (Signed)
[facility]    CSN: [id number] Arrival date & time: 01/02/17  1452     History   Chief Complaint Chief Complaint  Patient presents with  . Insect Bite  . Cellulitis    HPI Todd Valdez is a 56 y.o. male.   About five days ago patient felt a sore area on his right forearm, and noticed a small red bump.  He wonders if it could be the result of an insect bite, although does not recall an insect.  The area has gradually become more painful and increased in size.  He feels well otherwise.  No fevers, chills, and sweats.  He does not remember his last Tdap.   The history is provided by the patient.  Abscess  Abscess location: right forearm. Size:  3cm Abscess quality: fluctuance, painful and redness   Abscess quality: not draining, no itching, no warmth and not weeping   Red streaking: no   Duration:  3 days Progression:  Worsening Pain details:    Quality:  Aching   Severity:  Mild   Duration:  3 days   Timing:  Constant   Progression:  Worsening Chronicity:  New Context comment:  Possible insect bite Relieved by:  Nothing Exacerbated by: contact. Ineffective treatments:  Warm compresses Associated symptoms: no fatigue and no fever     Past Medical History:  Diagnosis Date  . Arthritis   . Diverticulitis   . GERD (gastroesophageal reflux disease)   . High triglycerides   . Reflux esophagitis     Patient Active Problem List   Diagnosis Date Noted  . Annual physical exam 05/01/2016  . Allergic rhinitis 01/21/2015  . 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 Hyperlipidemia, predominantly triglycerides 10/24/2012  . Gastric outlet obstruction 12/21/2011  . Hypertension 08/15/2011  . Rheumatoid arthritis ([place]) 10/14/2010    Past Surgical History:  Procedure Laterality Date  . bilateral feet reconstruction  10/2000  . feet surgery         Home Medications    Prior to Admission medications   Medication Sig Start Date End Date  Taking? Authorizing Provider  albuterol (PROVENTIL HFA;VENTOLIN HFA) 108 (90 Base) MCG/ACT inhaler Inhale 2 puffs into the lungs every 4 (four) hours as needed for wheezing or shortness of breath. 11/02/16   Kandra Nicolas, MD  atorvastatin (LIPITOR) 40 MG tablet TAKE 1 TABLET BY MOUTH DAILY 09/19/16   Silverio Decamp, MD  Azelastine-Fluticasone [phone] MCG/ACT SUSP One spray each nostril BID 01/21/15   Silverio Decamp, MD  clindamycin (CLEOCIN) 300 MG capsule Take 1 capsule (300 mg total) by mouth 3 (three) times daily. 01/02/17   Kandra Nicolas, MD  esomeprazole (NEXIUM) 40 MG capsule TAKE 1 CAPSULE BY MOUTH AT [facility] TIME 09/19/16   Silverio Decamp, MD  etanercept (ENBREL) 50 MG/ML injection Inject 50 mg into the skin once a week.    Historical Provider, MD  fenofibrate 160 MG tablet Take 1 tablet (160 mg total) by mouth daily. 09/26/16   Silverio Decamp, MD  lisinopril-hydrochlorothiazide (PRINZIDE,ZESTORETIC) 20-25 MG tablet TAKE 1 TABLET BY MOUTH DAILY 04/05/16   Silverio Decamp, MD  loratadine (CLARITIN) 10 MG tablet Take 1 tablet (10 mg total) by mouth daily. 05/24/16   Silverio Decamp, MD  loratadine (CLARITIN) 10 MG tablet TAKE ONE TABLET BY MOUTH ONCE DAILY 05/30/16   Silverio Decamp, MD  methotrexate (RHEUMATREX) 2.5 MG tablet Take 2.5 mg by mouth once a week. Caution:Chemotherapy.  Protect from light.     Historical Provider, MD  omega-3 acid ethyl esters (LOVAZA) 1 g capsule Take 2 capsules (2 g total) by mouth 2 (two) times daily. 09/26/16   Silverio Decamp, MD  Vitamin D, Ergocalciferol, (DRISDOL) 50000 units CAPS capsule Take 1 capsule (50,000 Units total) by mouth every 7 (seven) days. Take for 8 total doses(weeks) 09/26/16   Silverio Decamp, MD  Morrie Sheldon XR 11 MG TB24  03/27/16   Historical Provider, MD    Family History Family History  Problem Relation Age of Onset  . Hypertension Mother   . Hypertension Father     Social  History Social History  Substance Use Topics  . Smoking status: Former Smoker    Quit date: 02/07/1991  . Smokeless tobacco: Former Systems developer    Quit date: 08/14/2009  . Alcohol use No     Allergies   Patient has no known allergies.   Review of Systems Review of Systems  Constitutional: Negative for fatigue and fever.  All other systems reviewed and are negative.    Physical Exam Triage Vital Signs ED Triage Vitals [01/02/17 1502]  Enc Vitals Group     BP 132/86     Pulse Rate 75     Resp      Temp 98.2 F (36.8 C)     Temp Source Oral     SpO2 97 %     Weight 214 lb (97.1 kg)     Height      Head Circumference      Peak Flow      Pain Score 2     Pain Loc      Pain Edu?      Excl. in [place]?    No data found.   Updated Vital Signs BP 132/86 (BP Location: Left Arm)   Pulse 75   Temp 98.2 F (36.8 C) (Oral)   Wt 214 lb (97.1 kg)   SpO2 97%   BMI 28.23 kg/m   Visual Acuity Right Eye Distance:   Left Eye Distance:   Bilateral Distance:    Right Eye Near:   Left Eye Near:    Bilateral Near:     Physical Exam  Constitutional: He appears well-developed and well-nourished. No distress.  HENT:  Head: Normocephalic.  Eyes: Pupils are equal, round, and reactive to light.  Cardiovascular: Normal rate.   Pulmonary/Chest: Effort normal.  Musculoskeletal:       Right forearm: He exhibits tenderness.       Arms: Right forearm has an approximately 3cm diameter fluctuant cystic lesion with surrounding erythema.  No ascending lymphangitis.  Distal neurovascular function is intact.  Neurological: He is alert.  Skin: Skin is warm and dry.  Nursing note and vitals reviewed.    UC Treatments / Results  Labs (all labs ordered are listed, but only abnormal results are displayed) Labs Reviewed  WOUND CULTURE    EKG  EKG Interpretation None       Radiology No results found.  Procedures Procedures Incise and drain cyst/abscess Risks and benefits of  procedure explained to patient and verbal consent obtained.  Using sterile technique and local anesthesia with 1% lidocaine with epinephrine, cleansed affected area with Betadine and alcohol. Identified the most fluctuant area of lesion and incised with #11 blade.  Expressed blood and purulent material.  Inserted Iodoform gauze packing.  Bandage applied.  Patient tolerated well   Medications Ordered in UC Medications  Tdap (BOOSTRIX) injection 0.5  mL (not administered)     Initial Impression / Assessment and Plan / UC Course  I have reviewed the triage vital signs and the nursing notes.  Pertinent labs & imaging results that were available during my care of the patient were reviewed by me and considered in my medical decision making (see chart for details).    Administered Tdap Wound culture pending.  Suspect staph; will begin empiric clindamycin. Elevate arm.  Leave bandage in place until follow-up visit tomorrow.  May take Ibuprofen or Tylenol for pain. Return tomorrow for packing removal and follow-up.    Final Clinical Impressions(s) / UC Diagnoses   Final diagnoses:  Abscess of right forearm    New Prescriptions New Prescriptions   CLINDAMYCIN (CLEOCIN) 300 MG CAPSULE    Take 1 capsule (300 mg total) by mouth 3 (three) times daily.     Kandra Nicolas, MD 01/02/17 [phone]

## 2017-01-02 NOTE — Discharge Instructions (Signed)
Elevate arm.  Leave bandage in place until follow-up visit tomorrow.  May take Ibuprofen or Tylenol for pain.

## 2017-01-03 ENCOUNTER — Emergency Department (INDEPENDENT_AMBULATORY_CARE_PROVIDER_SITE_OTHER)
Admission: EM | Admit: 2017-01-03 | Discharge: 2017-01-03 | Disposition: A | Discharge status: Home / Self Care | Payer: BLUE CROSS/BLUE SHIELD | Source: Home / Self Care | Attending: Family Medicine | Admitting: Family Medicine

## 2017-01-03 ENCOUNTER — Encounter: Payer: Self-pay | Admitting: Emergency Medicine

## 2017-01-03 DIAGNOSIS — Z5189 Encounter for other specified aftercare: Secondary | ICD-10-CM

## 2017-01-03 NOTE — ED Provider Notes (Signed)
Vitals:   01/03/17 1613  BP: (!) 146/84  Pulse: 84  Temp: 98 F (36.7 C)   Pt presenting to UC for recheck of abscess on Right forearm that was I&D with packing placed at [facility] yesterday, 01/02/17.  Abscess appears to be healing well. Shallow opening in the skin. Packing has already fallen out on its own. Scant purulent discharge expressed from the wound. Surrounding induration and mild tenderness. Pt notes he has been taking the antibiotic, clindamycin, as prescribed w/o side effects. Encouraged to keep taking the full course of antibiotics. Encouraged to use warm compresses and advised he may gently clean wound in shower with warm water. f/u with PCP or return to UC if needed in 3-4 days if not improving. Patient verbalized understanding and agreement with treatment plan.      Noland Fordyce, PA-C 01/03/17 1621

## 2017-01-03 NOTE — Discharge Instructions (Signed)
° °  You should change the bandage 2-3 times daily.  You may apply over the counter antibiotic ointment.  You may keep the wound clean with warm water and soap in the shower but do NOT soak the wound in a tub, hot tub, or pool.  When you have a bandage on, you may use a warm heating pad on it to help encouraged drainage. Make sure the heating pad is not too hot and never fall asleep with one on as it can cause burns to the skin.

## 2017-01-03 NOTE — ED Triage Notes (Signed)
Wound recheck, wick was placed in wound yesterday, but patient said bandage was sliding down arm and when I removed the dressing the wick was already out.

## 2017-01-05 ENCOUNTER — Telehealth: Payer: Self-pay | Admitting: *Deleted

## 2017-01-05 LAB — WOUND CULTURE
GRAM STAIN: NONE SEEN
Gram Stain: NONE SEEN

## 2017-01-05 NOTE — Telephone Encounter (Signed)
Callback: No answer, okay to leave detailed message on machine. WCX results left on VM. Sensitive to antibiotic prescribed. Encouraged to complete course entirely. Call back as needed and if not healing.

## 2017-03-27 DIAGNOSIS — H2513 Age-related nuclear cataract, bilateral: Secondary | ICD-10-CM | POA: Insufficient documentation

## 2017-03-27 DIAGNOSIS — H16003 Unspecified corneal ulcer, bilateral: Secondary | ICD-10-CM | POA: Insufficient documentation

## 2017-06-26 ENCOUNTER — Encounter: Payer: Self-pay | Admitting: *Deleted

## 2017-06-26 ENCOUNTER — Emergency Department (INDEPENDENT_AMBULATORY_CARE_PROVIDER_SITE_OTHER)
Admission: EM | Admit: 2017-06-26 | Discharge: 2017-06-26 | Disposition: A | Discharge status: Home / Self Care | Payer: BLUE CROSS/BLUE SHIELD | Source: Home / Self Care | Attending: Family Medicine | Admitting: Family Medicine

## 2017-06-26 DIAGNOSIS — L03221 Cellulitis of neck: Secondary | ICD-10-CM | POA: Diagnosis not present

## 2017-06-26 MED ORDER — MUPIROCIN 2 % EX OINT: TOPICAL_OINTMENT | CUTANEOUS | 0 refills | Status: DC

## 2017-06-26 MED ORDER — CLINDAMYCIN HCL 300 MG PO CAPS: 300.0000 mg | ORAL_CAPSULE | Freq: Three times a day (TID) | ORAL | 0 refills | Status: DC

## 2017-06-26 NOTE — ED Provider Notes (Signed)
[facility]    CSN: [id number] Arrival date & time: 06/26/17  3536     History   Chief Complaint Chief Complaint  Patient presents with  . Abscess    HPI Jermond Burkemper is a 56 y.o. male.   HPI Lory Galan is a 56 y.o. male presenting to UC with c/o 2 days worsening redness, soreness and swelling under his chin on the Left side. Hx of abscesses in the past. He notes he did get a small amount of pus and clear fluid to drain from it last night but he is still having soreness in the area. Denies fever, chills, n/v/d.  He is on methotrexate for RA.    Past Medical History:  Diagnosis Date  . Arthritis   . Diverticulitis   . GERD (gastroesophageal reflux disease)   . High triglycerides   . Reflux esophagitis     Patient Active Problem List   Diagnosis Date Noted  . Annual physical exam 05/01/2016  . Allergic rhinitis 01/21/2015  . 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 Hyperlipidemia, predominantly triglycerides 10/24/2012  . Gastric outlet obstruction 12/21/2011  . Hypertension 08/15/2011  . Rheumatoid arthritis ([place]) 10/14/2010    Past Surgical History:  Procedure Laterality Date  . bilateral feet reconstruction  10/2000  . feet surgery         Home Medications    Prior to Admission medications   Medication Sig Start Date End Date Taking? Authorizing Provider  albuterol (PROVENTIL HFA;VENTOLIN HFA) 108 (90 Base) MCG/ACT inhaler Inhale 2 puffs into the lungs every 4 (four) hours as needed for wheezing or shortness of breath. 11/02/16   Kandra Nicolas, MD  atorvastatin (LIPITOR) 40 MG tablet TAKE 1 TABLET BY MOUTH DAILY 09/19/16   Silverio Decamp, MD  Azelastine-Fluticasone [phone] MCG/ACT SUSP One spray each nostril BID 01/21/15   Silverio Decamp, MD  clindamycin (CLEOCIN) 300 MG capsule Take 1 capsule (300 mg total) by mouth 3 (three) times daily. X 7 days 06/26/17   Noe Gens, PA-C  esomeprazole (NEXIUM) 40 MG capsule  TAKE 1 CAPSULE BY MOUTH AT [facility] TIME 09/19/16   Silverio Decamp, MD  etanercept (ENBREL) 50 MG/ML injection Inject 50 mg into the skin once a week.    [provider]  fenofibrate 160 MG tablet Take 1 tablet (160 mg total) by mouth daily. 09/26/16   Silverio Decamp, MD  lisinopril-hydrochlorothiazide (PRINZIDE,ZESTORETIC) 20-25 MG tablet TAKE 1 TABLET BY MOUTH DAILY 04/05/16   Silverio Decamp, MD  loratadine (CLARITIN) 10 MG tablet Take 1 tablet (10 mg total) by mouth daily. 05/24/16   Silverio Decamp, MD  loratadine (CLARITIN) 10 MG tablet TAKE ONE TABLET BY MOUTH ONCE DAILY 05/30/16   Silverio Decamp, MD  methotrexate (RHEUMATREX) 2.5 MG tablet Take 2.5 mg by mouth once a week. Caution:Chemotherapy. Protect from light.     [provider]  mupirocin ointment (BACTROBAN) 2 % Apply to wound 3 times daily for 7 days. May apply to new sores if needed. 06/26/17   Noe Gens, PA-C  omega-3 acid ethyl esters (LOVAZA) 1 g capsule Take 2 capsules (2 g total) by mouth 2 (two) times daily. 09/26/16   Silverio Decamp, MD  Vitamin D, Ergocalciferol, (DRISDOL) 50000 units CAPS capsule Take 1 capsule (50,000 Units total) by mouth every 7 (seven) days. Take for 8 total doses(weeks) 09/26/16   Silverio Decamp, MD  XELJANZ XR 11 MG TB24  03/27/16  [provider]    Family History Family History  Problem Relation Age of Onset  . Hypertension Mother   . Hypertension Father     Social History Social History  Substance Use Topics  . Smoking status: Former Smoker    Quit date: 02/07/1991  . Smokeless tobacco: Former Systems developer    Quit date: 08/14/2009  . Alcohol use No     Allergies   Patient has no known allergies.   Review of Systems Review of Systems  Constitutional: Negative for chills and fever.  Gastrointestinal: Negative for abdominal pain, diarrhea, nausea and vomiting.  Skin: Positive for color change and wound. Negative  for rash.     Physical Exam Triage Vital Signs ED Triage Vitals  Enc Vitals Group     BP 06/26/17 0834 (!) 133/56     Pulse Rate 06/26/17 0834 77     Resp 06/26/17 0834 16     Temp 06/26/17 0834 98.1 F (36.7 C)     Temp Source 06/26/17 0834 Oral     SpO2 06/26/17 0834 97 %     Weight 06/26/17 0835 201 lb (91.2 kg)     Height --      Head Circumference --      Peak Flow --      Pain Score 06/26/17 0835 2     Pain Loc --      Pain Edu? --      Excl. in [place]? --    No data found.   Updated Vital Signs BP (!) 133/56 (BP Location: Left Arm)   Pulse 77   Temp 98.1 F (36.7 C) (Oral)   Resp 16   Wt 201 lb (91.2 kg)   SpO2 97%   BMI 26.52 kg/m   Visual Acuity Right Eye Distance:   Left Eye Distance:   Bilateral Distance:    Right Eye Near:   Left Eye Near:    Bilateral Near:     Physical Exam  Constitutional: He is oriented to person, place, and time. He appears well-developed and well-nourished. No distress.  HENT:  Head: Normocephalic and atraumatic.    Mouth/Throat: Oropharynx is clear and moist.  2-3cm area of erythema and induration with mild tenderness. Centralized pustule with scant yellow discharge. No underlying fluctuance.   Eyes: EOM are normal.  Neck: Normal range of motion.  Cardiovascular: Normal rate.   Pulmonary/Chest: Effort normal.  Musculoskeletal: Normal range of motion.  Neurological: He is alert and oriented to person, place, and time.  Skin: Skin is warm and dry. He is not diaphoretic.  Psychiatric: He has a normal mood and affect. His behavior is normal.  Nursing note and vitals reviewed.    UC Treatments / Results  Labs (all labs ordered are listed, but only abnormal results are displayed) Labs Reviewed - No data to display  EKG  EKG Interpretation None       Radiology No results found.  Procedures Procedures (including critical care time)  Medications Ordered in UC Medications - No data to display   Initial  Impression / Assessment and Plan / UC Course  I have reviewed the triage vital signs and the nursing notes.  Pertinent labs & imaging results that were available during my care of the patient were reviewed by me and considered in my medical decision making (see chart for details).     Cellulitis to Left side of neck/chin for 2 days. Pt was able to drain area a little yesterday. Will  start on mupirocin due to open wound as well as clindamycin. Encouraged warm compresses. May need I&D in 3-4 days if area still enlarging.   Final Clinical Impressions(s) / UC Diagnoses   Final diagnoses:  Cellulitis, neck    New Prescriptions Discharge Medication List as of 06/26/2017  8:49 AM    START taking these medications   Details  clindamycin (CLEOCIN) 300 MG capsule Take 1 capsule (300 mg total) by mouth 3 (three) times daily. X 7 days, Starting Tue 06/26/2017, Normal    mupirocin ointment (BACTROBAN) 2 % Apply to wound 3 times daily for 7 days. May apply to new sores if needed., Normal         Controlled Substance Prescriptions [place] Controlled Substance Registry consulted? Not Applicable   Tyrell Antonio 06/26/17 1572

## 2017-06-26 NOTE — ED Triage Notes (Signed)
Pt c/o abscess under his chin x 2 days. Denies fever.

## 2017-09-19 ENCOUNTER — Encounter: Payer: Self-pay | Admitting: Pharmacist

## 2017-09-19 ENCOUNTER — Ambulatory Visit (HOSPITAL_BASED_OUTPATIENT_CLINIC_OR_DEPARTMENT_OTHER): Payer: BLUE CROSS/BLUE SHIELD | Admitting: Pharmacist

## 2017-09-19 DIAGNOSIS — Z79899 Other long term (current) drug therapy: Secondary | ICD-10-CM

## 2017-09-19 MED ORDER — ABATACEPT 250 MG IV SOLR: INTRAVENOUS | 11 refills | Status: DC

## 2017-09-19 MED FILL — ORENCIA 250 MG SOLR: 250 | 28 days supply | Qty: 3 | Fill #0

## 2017-09-19 NOTE — Progress Notes (Addendum)
   S: Patient presents to [facility] for review of their specialty medication therapy.  Patient is currently taking Orencia for rheumatoid arthritis. Patient is managed by Dr. Amil Amen for this.   Adherence: denies any missed doses. Has been on it for 4 months.   Efficacy: feels much better on Orencia and since he went out on disability.   FDA-approved dosing: 750 mg every 4 weeks (gets infusion at [facility]).  Dose adjustments: Renal impairment: none Hepatic impairment: none Toxicity: discontinue if serious infection develops  Drug-drug interactions: none  Screenings: TB screening: completed per patient Hepatitis Screening: completed per patient Blood glucose: Orencia contains maltose which make falsely elevate glucose levels  Monitoring: S/sx of infection: denies. Patient knows that he will not get an infusion if he has any type of infection. S/sx of hypersensitivity: denies - they keep him at the infusion center after the infusion and watch him Other adverse effects: denies  O:     Lab Results  Component Value Date   WBC 8.2 09/25/2016   HGB 14.3 09/25/2016   HCT 42.6 09/25/2016   MCV 91.6 09/25/2016   PLT 317 09/25/2016      Chemistry      Component Value Date/Time   NA 140 09/25/2016 0530   K 4.4 09/25/2016 0530   CL 107 09/25/2016 0530   CO2 23 09/25/2016 0530   BUN 16 09/25/2016 0530   CREATININE 0.82 09/25/2016 0530      Component Value Date/Time   CALCIUM 9.2 09/25/2016 0530   ALKPHOS 78 09/25/2016 0530   AST 14 09/25/2016 0530   ALT 11 09/25/2016 0530   BILITOT 0.4 09/25/2016 0530       A/P: 1. Medication review: Patient currently on [place] for the treatment of rheumatoid arthritis and is tolerating it well with improved control of RA. Reviewed the medication with the patient, including the following: Orencia is a selective T-cell costimulation blocker indicated for rheumatoid arthritis. The most common  adverse effects are infections, headache, and injection site reactions. There is a possible adverse effect of increased risk of malignancy but it is not fully understood if this is due to the drug or the disease state itself. The patient was instructed to avoid use of live vaccinations without the approval of a physician. No recommendations for any changes.   Christella Hartigan, PharmD, BCPS, BCACP, [facility] [phone]

## 2017-10-15 DIAGNOSIS — Z79899 Other long term (current) drug therapy: Secondary | ICD-10-CM | POA: Diagnosis not present

## 2017-10-15 DIAGNOSIS — M0589 Other rheumatoid arthritis with rheumatoid factor of multiple sites: Secondary | ICD-10-CM | POA: Diagnosis not present

## 2017-10-17 MED FILL — ORENCIA 250 MG SOLR: 250 | 28 days supply | Qty: 3 | Fill #1

## 2017-10-19 DIAGNOSIS — M069 Rheumatoid arthritis, unspecified: Secondary | ICD-10-CM | POA: Diagnosis not present

## 2017-10-19 DIAGNOSIS — H18463 Peripheral corneal degeneration, bilateral: Secondary | ICD-10-CM | POA: Diagnosis not present

## 2017-10-19 DIAGNOSIS — H16403 Unspecified corneal neovascularization, bilateral: Secondary | ICD-10-CM | POA: Diagnosis not present

## 2017-10-19 DIAGNOSIS — H179 Unspecified corneal scar and opacity: Secondary | ICD-10-CM | POA: Diagnosis not present

## 2017-10-19 DIAGNOSIS — H0015 Chalazion left lower eyelid: Secondary | ICD-10-CM | POA: Diagnosis not present

## 2017-10-25 ENCOUNTER — Encounter: Payer: Self-pay | Admitting: Sports Medicine

## 2017-10-25 ENCOUNTER — Ambulatory Visit (INDEPENDENT_AMBULATORY_CARE_PROVIDER_SITE_OTHER): Payer: 59 | Admitting: Sports Medicine

## 2017-10-25 VITALS — BP 130/80 | HR 94 | Resp 18 | Ht 72.0 in | Wt 208.0 lb

## 2017-10-25 DIAGNOSIS — M069 Rheumatoid arthritis, unspecified: Secondary | ICD-10-CM

## 2017-10-25 DIAGNOSIS — E782 Mixed hyperlipidemia: Secondary | ICD-10-CM

## 2017-10-25 DIAGNOSIS — I10 Essential (primary) hypertension: Secondary | ICD-10-CM

## 2017-10-25 DIAGNOSIS — Z7952 Long term (current) use of systemic steroids: Secondary | ICD-10-CM | POA: Diagnosis not present

## 2017-10-25 DIAGNOSIS — K4090 Unilateral inguinal hernia, without obstruction or gangrene, not specified as recurrent: Secondary | ICD-10-CM | POA: Insufficient documentation

## 2017-10-25 DIAGNOSIS — E785 Hyperlipidemia, unspecified: Secondary | ICD-10-CM

## 2017-10-25 DIAGNOSIS — Z0000 Encounter for general adult medical examination without abnormal findings: Secondary | ICD-10-CM | POA: Diagnosis not present

## 2017-10-25 DIAGNOSIS — K219 Gastro-esophageal reflux disease without esophagitis: Secondary | ICD-10-CM

## 2017-10-25 MED ORDER — ATORVASTATIN CALCIUM 40 MG PO TABS: 40.0000 mg | ORAL_TABLET | Freq: Every day | ORAL | 3 refills | Status: DC

## 2017-10-25 MED ORDER — LISINOPRIL-HYDROCHLOROTHIAZIDE 20-25 MG PO TABS: 1.0000 | ORAL_TABLET | Freq: Every day | ORAL | 3 refills | Status: DC

## 2017-10-25 MED ORDER — FENOFIBRATE 160 MG PO TABS: 160.0000 mg | ORAL_TABLET | Freq: Every day | ORAL | 3 refills | Status: DC

## 2017-10-25 MED ORDER — ESOMEPRAZOLE MAGNESIUM 40 MG PO CPDR: 40.0000 mg | DELAYED_RELEASE_CAPSULE | Freq: Every day | ORAL | 3 refills | Status: DC

## 2017-10-25 MED ORDER — OMEGA-3-ACID ETHYL ESTERS 1 G PO CAPS: 2.0000 g | ORAL_CAPSULE | Freq: Two times a day (BID) | ORAL | 3 refills | Status: DC

## 2017-10-25 MED FILL — OMEGA-3 ETHYL ESTERS 1 GM C: 1 | 90 days supply | Qty: 360 | Fill #0

## 2017-10-25 MED FILL — ESOMEPRAZOLE MAG DR 40 MG C: 40 | 90 days supply | Qty: 90 | Fill #0

## 2017-10-25 MED FILL — ATORVASTATIN 40 MG TABLET: 40 | 90 days supply | Qty: 90 | Fill #0

## 2017-10-25 MED FILL — LISINOPRIL-HCTZ 20-25 MG TA: 20-25 | 90 days supply | Qty: 90 | Fill #0

## 2017-10-25 MED FILL — FENOFIBRATE 160 MG TABLET: 160 | 90 days supply | Qty: 90 | Fill #0

## 2017-10-25 NOTE — Assessment & Plan Note (Signed)
History of hypertriglyceridemia but has been off of all medicines for some time now. Rechecking labs, refilling medication, he will wait for 2-3 months before getting the blood work actually checked.

## 2017-10-25 NOTE — Progress Notes (Signed)
Subjective:    CC: Annual physical exam  HPI:  Randol is here for his physical.  He is up-to-date on screening measures.  Hypertension: Stable and well controlled.  Hypertriglyceridemia: Has been off of medications for a while, needs refills.  Rheumatoid arthritis: Had several flares over the year that included some degree of uveitis, has been placed on new biologic.  Has had several bursts and several prolonged courses of prednisone.  Hernia: Present for months, not really tender, he is able to reduce it himself, right lower quadrant.  Occasional burning sensations when it protrudes.  I reviewed the past medical history, family history, social history, surgical history, and allergies today and no changes were needed.  Please see the problem list section below in epic for further details.  Past Medical History: Past Medical History:  Diagnosis Date  . Arthritis   . Diverticulitis   . GERD (gastroesophageal reflux disease)   . High triglycerides   . Reflux esophagitis    Past Surgical History: Past Surgical History:  Procedure Laterality Date  . bilateral feet reconstruction  10/2000  . feet surgery     Social History: Social History   Socioeconomic History  . Marital status: Married    Spouse name: None  . Number of children: None  . Years of education: None  . Highest education level: None  Social Needs  . Financial resource strain: None  . Food insecurity - worry: None  . Food insecurity - inability: None  . Transportation needs - medical: None  . Transportation needs - non-medical: None  Occupational History  . None  Tobacco Use  . Smoking status: Former Smoker    Last attempt to quit: 02/07/1991    Years since quitting: 26.7  . Smokeless tobacco: Former Systems developer    Quit date: 08/14/2009  Substance and Sexual Activity  . Alcohol use: No  . Drug use: No  . Sexual activity: Yes    Partners: Female    Comment: married.   Other Topics Concern  . None  Social  History Narrative  . None   Family History: Family History  Problem Relation Age of Onset  . Hypertension Mother   . Hypertension Father    Allergies: No Known Allergies Medications: See med rec.  Review of Systems: No headache, visual changes, nausea, vomiting, diarrhea, constipation, dizziness, abdominal pain, skin rash, fevers, chills, night sweats, swollen lymph nodes, weight loss, chest pain, body aches, joint swelling, muscle aches, shortness of breath, mood changes, visual or auditory hallucinations.  Objective:    General: Well Developed, well nourished, and in no acute distress.  Neuro: Alert and oriented x3, extra-ocular muscles intact, sensation grossly intact. Cranial nerves II through XII are intact, motor, sensory, and coordinative functions are all intact. HEENT: Normocephalic, atraumatic, pupils equal round reactive to light, neck supple, no masses, no lymphadenopathy, thyroid nonpalpable. Oropharynx, nasopharynx, external ear canals are unremarkable. Skin: Warm and dry, no rashes noted.  Cardiac: Regular rate and rhythm, no murmurs rubs or gallops.  Respiratory: Clear to auscultation bilaterally. Not using accessory muscles, speaking in full sentences.  Abdominal: Soft, nontender, nondistended, positive bowel sounds, no masses, no organomegaly.  There is a right sided direct inguinal hernia that is reducible.  Nontender. Musculoskeletal: Shoulder, elbow, wrist, hip, knee, ankle stable, and with full range of motion.  Impression and Recommendations:    The patient was counselled, risk factors were discussed, anticipatory guidance given.  Annual physical exam Annual physical as above, up-to-date on health maintenance.  He did recently have his flu shot.  Hyperlipidemia, predominantly triglycerides History of hypertriglyceridemia but has been off of all medicines for some time now. Rechecking labs, refilling medication, he will wait for 2-3 months before getting the  blood work actually checked.  Hypertension Well-controlled, no changes.  Rheumatoid arthritis ([place]) Had a major flare of rheumatoid arthritis over the past year, he is on a new biologic, but keeps good follow-up with his rheumatologist. Because he has been on multiple chronic courses of prednisone we are going to add a bone density test.  Inguinal hernia, right Large, reduced in the office today. Referral to general surgery for correction.  Current chronic use of systemic steroids Recent bone densitometry was 2 years ago, ordering another bone density test.  ___________________________________________ Gwen Her. Dianah Field, M.D., ABFM., CAQSM. Primary Care and [place] In[employer] of [employer] [facility]

## 2017-10-25 NOTE — Assessment & Plan Note (Signed)
Annual physical as above, up-to-date on health maintenance.  He did recently have his flu shot.

## 2017-10-25 NOTE — Assessment & Plan Note (Signed)
Recent bone densitometry was 2 years ago, ordering another bone density test.

## 2017-10-25 NOTE — Assessment & Plan Note (Signed)
Well controlled, no changes 

## 2017-10-25 NOTE — Assessment & Plan Note (Signed)
Had a major flare of rheumatoid arthritis over the past year, he is on a new biologic, but keeps good follow-up with his rheumatologist. Because he has been on multiple chronic courses of prednisone we are going to add a bone density test.

## 2017-10-25 NOTE — Assessment & Plan Note (Signed)
Large, reduced in the office today. Referral to general surgery for correction.

## 2017-11-02 DIAGNOSIS — M3500 Sicca syndrome, unspecified: Secondary | ICD-10-CM | POA: Diagnosis not present

## 2017-11-02 DIAGNOSIS — E663 Overweight: Secondary | ICD-10-CM | POA: Diagnosis not present

## 2017-11-02 DIAGNOSIS — Z6829 Body mass index (BMI) 29.0-29.9, adult: Secondary | ICD-10-CM | POA: Diagnosis not present

## 2017-11-02 DIAGNOSIS — H16002 Unspecified corneal ulcer, left eye: Secondary | ICD-10-CM | POA: Diagnosis not present

## 2017-11-02 DIAGNOSIS — M150 Primary generalized (osteo)arthritis: Secondary | ICD-10-CM | POA: Diagnosis not present

## 2017-11-02 DIAGNOSIS — M0589 Other rheumatoid arthritis with rheumatoid factor of multiple sites: Secondary | ICD-10-CM | POA: Diagnosis not present

## 2017-11-02 MED FILL — METHOTREXATE 25 MG/ML VIAL: 50 | 28 days supply | Qty: 4 | Fill #0

## 2017-11-02 MED FILL — BD TB SYRINGE 27GX1/2: 27G X 1/2" | 28 days supply | Qty: 4 | Fill #0

## 2017-11-02 MED FILL — predniSONE 5 MG TABS: 5 | 30 days supply | Qty: 60 | Fill #0

## 2017-11-02 MED FILL — BD TB SYRINGE 27GX1/2": 27G X 1/2" | 28 days supply | Qty: 4 | Fill #0

## 2017-11-07 DIAGNOSIS — K4090 Unilateral inguinal hernia, without obstruction or gangrene, not specified as recurrent: Secondary | ICD-10-CM | POA: Diagnosis not present

## 2017-11-12 DIAGNOSIS — M0589 Other rheumatoid arthritis with rheumatoid factor of multiple sites: Secondary | ICD-10-CM | POA: Diagnosis not present

## 2017-11-13 ENCOUNTER — Ambulatory Visit: Payer: Self-pay | Admitting: Surgery

## 2017-11-15 ENCOUNTER — Other Ambulatory Visit: Payer: Self-pay

## 2017-11-15 ENCOUNTER — Encounter (HOSPITAL_BASED_OUTPATIENT_CLINIC_OR_DEPARTMENT_OTHER): Payer: Self-pay

## 2017-11-15 MED FILL — ORENCIA 250 MG SOLR: 250 | 28 days supply | Qty: 3 | Fill #2

## 2017-11-15 NOTE — Progress Notes (Signed)
Spoke with:  Todd Valdez NPO:  After Midnight, no gum, candy, or mints   Arrival time: 0900AM Labs: Istat8, EKG AM medications: Hibiclens, Esomeprazole, Fenofibrate, Prednisone Pre op orders:  Yes Ride home: Mickel Baas (wife) [phone]

## 2017-11-23 DIAGNOSIS — H179 Unspecified corneal scar and opacity: Secondary | ICD-10-CM | POA: Diagnosis not present

## 2017-11-23 DIAGNOSIS — H18463 Peripheral corneal degeneration, bilateral: Secondary | ICD-10-CM | POA: Diagnosis not present

## 2017-11-23 DIAGNOSIS — H16403 Unspecified corneal neovascularization, bilateral: Secondary | ICD-10-CM | POA: Diagnosis not present

## 2017-11-23 DIAGNOSIS — M069 Rheumatoid arthritis, unspecified: Secondary | ICD-10-CM | POA: Diagnosis not present

## 2017-11-23 DIAGNOSIS — H0015 Chalazion left lower eyelid: Secondary | ICD-10-CM | POA: Diagnosis not present

## 2017-11-25 ENCOUNTER — Encounter (HOSPITAL_BASED_OUTPATIENT_CLINIC_OR_DEPARTMENT_OTHER): Payer: Self-pay | Admitting: Surgery

## 2017-11-25 NOTE — H&P (Signed)
General Surgery [facility]  Lennox Solders DOB: August 01, 1961 Married / Language: English / Race: White Male   History of Present Illness  The patient is a 57 year old male who presents with an inguinal hernia.  CC: right inguinal hernia  Patient is referred by Dr. Dianah Field for surgical evaluation and management of right inguinal hernia. Patient had first noted a hernia approximately 4 years ago. It has always been reducible. It has gradually increased in size. Patient is recently retired from an occupation that required a moderate amount of physical exertion and lifting. Patient has had no signs or symptoms of intestinal obstruction. He has had no prior hernia repairs. He has had no other abdominal surgery. Patient does have rheumatoid arthritis and is on multiple medications. Patient would like to proceed with inguinal hernia repair in the near future. He has discussed this with his rheumatologist and has discussed how to manage his medications during the perioperative interval.   Past Surgical History Foot Surgery  Bilateral. Oral Surgery   Diagnostic Studies History Colonoscopy  1-5 years ago  Allergies  No Known Allergies Allergies Reconciled   Medication History Atorvastatin Calcium (40MG  Tablet, Oral) Active. Esomeprazole Magnesium (40MG  Capsule DR, Oral) Active. Fenofibrate (160MG  Tablet, Oral) Active. Methotrexate Sodium (50MG /2ML Solution, Injection) Active. Orencia (250MG  For Solution, Intravenous) Active. PredniSONE (10MG  Tablet, 15 mg Oral) Active. PredniSONE (5MG  Tablet, 15 mg Oral) Active. Lisinopril-Hydrochlorothiazide (20-25MG  Tablet, Oral) Active. Folic Acid (1MG  Tablet, Oral) Active. Lovaza (1GM Capsule, Oral) Active. Medications Reconciled  Social History Caffeine use  Carbonated beverages, Coffee, Tea. No alcohol use  No drug use  Tobacco use  Never smoker.  Family History Cancer  Mother. Heart  Disease  Father. Hypertension  Father. Melanoma  Father. Respiratory Condition  Mother.  Other Problems Arthritis  Gastroesophageal Reflux Disease  Hypercholesterolemia  Inguinal Hernia  Melanoma  Other disease, cancer, significant illness     Review of Systems General Not Present- Appetite Loss, Chills, Fatigue, Fever, Night Sweats, Weight Gain and Weight Loss. Skin Not Present- Change in Wart/Mole, Dryness, Hives, Jaundice, New Lesions, Non-Healing Wounds, Rash and Ulcer. HEENT Not Present- Earache, Hearing Loss, Hoarseness, Nose Bleed, Oral Ulcers, Ringing in the Ears, Seasonal Allergies, Sinus Pain, Sore Throat, Visual Disturbances, Wears glasses/contact lenses and Yellow Eyes. Respiratory Present- Snoring. Not Present- Bloody sputum, Chronic Cough, Difficulty Breathing and Wheezing. Breast Not Present- Breast Mass, Breast Pain, Nipple Discharge and Skin Changes. Cardiovascular Not Present- Chest Pain, Difficulty Breathing Lying Down, Leg Cramps, Palpitations, Rapid Heart Rate, Shortness of Breath and Swelling of Extremities. Gastrointestinal Not Present- Abdominal Pain, Bloating, Bloody Stool, Change in Bowel Habits, Chronic diarrhea, Constipation, Difficulty Swallowing, Excessive gas, Gets full quickly at meals, Hemorrhoids, Indigestion, Nausea, Rectal Pain and Vomiting. Male Genitourinary Not Present- Blood in Urine, Change in Urinary Stream, Frequency, Impotence, Nocturia, Painful Urination, Urgency and Urine Leakage. Musculoskeletal Present- Joint Pain and Joint Stiffness. Not Present- Back Pain, Muscle Pain, Muscle Weakness and Swelling of Extremities. Neurological Not Present- Decreased Memory, Fainting, Headaches, Numbness, Seizures, Tingling, Tremor, Trouble walking and Weakness. Psychiatric Not Present- Anxiety, Bipolar, Change in Sleep Pattern, Depression, Fearful and Frequent crying. Endocrine Not Present- Cold Intolerance, Excessive Hunger, Hair Changes, Heat  Intolerance, Hot flashes and New Diabetes. Hematology Not Present- Blood Thinners, Easy Bruising, Excessive bleeding, Gland problems, HIV and Persistent Infections.  Vitals Weight: 210 lb Height: 74in Body Surface Area: 2.22 m Body Mass Index: 26.96 kg/m  Temp.: 98.88F(Oral)  Pulse: 95 (Regular)  BP: 128/86 (Sitting, Left  Arm, Standard)  Physical Exam  See vital signs recorded above  GENERAL APPEARANCE Development: normal Nutritional status: normal Gross deformities: none  SKIN Rash, lesions, ulcers: none Induration, erythema: none Nodules: none palpable  EYES Conjunctiva and lids: normal Pupils: equal and reactive Iris: normal bilaterally  EARS, NOSE, MOUTH, THROAT External ears: no lesion or deformity External nose: no lesion or deformity Hearing: grossly normal Lips: no lesion or deformity Dentition: normal for age Oral mucosa: moist  NECK Symmetric: yes Trachea: midline Thyroid: no palpable nodules in the thyroid bed  CHEST Respiratory effort: normal Retraction or accessory muscle use: no Breath sounds: normal bilaterally Rales, rhonchi, wheeze: none  CARDIOVASCULAR Auscultation: regular rhythm, normal rate Murmurs: none Pulses: carotid and radial pulse 2+ palpable Lower extremity edema: none Lower extremity varicosities: none  ABDOMEN Distension: none Masses: none palpable Tenderness: none Hepatosplenomegaly: not present  GENITOURINARY Penis: no lesions Scrotum: no masses There is an obvious bulge in the right inguinal region. Palpation reveals a moderate to large right inguinal hernia which is reducible. It prolapses and augments with coughing and Valsalva. Palpation in the left inguinal canal with cough and Valsalva shows no sign of hernia.  MUSCULOSKELETAL Station and gait: normal Digits and nails: no clubbing or cyanosis Muscle strength: grossly normal all extremities Range of motion: grossly normal all  extremities Deformity: Changes consistent with rheumatoid arthritis involving both hands  LYMPHATIC Cervical: none palpable Supraclavicular: none palpable  PSYCHIATRIC Oriented to person, place, and time: yes Mood and affect: normal for situation Judgment and insight: appropriate for situation    Assessment & Plan  INGUINAL HERNIA OF RIGHT SIDE WITHOUT OBSTRUCTION OR GANGRENE (K40.90)  Pt Education - Pamphlet Given - Hernia Surgery: discussed with patient and provided information.  Pt Education - CCS Mesh education: discussed with patient and provided information. Patient presents on referral from his primary care physician with right inguinal hernia for repair. Patient is provided with written literature on hernia surgery to review at home. He is also provided with an information sheet regarding the use of prosthetic mesh.  I have recommended proceeding with open right inguinal hernia repair with mesh as the procedure of choice. This has the lowest risk of recurrence. We discussed the procedure, the outpatient surgical stay, and his postoperative recovery. We discussed restrictions on his activities after the surgery. Patient understands and wishes to proceed with surgery in the near future.  The risks and benefits of the procedure have been discussed at length with the patient. The patient understands the proposed procedure, potential alternative treatments, and the course of recovery to be expected. All of the patient's questions have been answered at this time. The patient wishes to proceed with surgery.  Armandina Gemma, [place] [facility] Office: [phone]

## 2017-11-26 MED FILL — DOXYCYCLINE HYC 50 MG CAP: 50 | 30 days supply | Qty: 60 | Fill #0

## 2017-11-27 ENCOUNTER — Encounter (HOSPITAL_BASED_OUTPATIENT_CLINIC_OR_DEPARTMENT_OTHER): Payer: Self-pay | Admitting: Anesthesiology

## 2017-11-27 ENCOUNTER — Encounter (HOSPITAL_BASED_OUTPATIENT_CLINIC_OR_DEPARTMENT_OTHER)
Admission: RE | Disposition: A | Discharge status: Home / Self Care | Payer: Self-pay | Source: Ambulatory Visit | Attending: Surgery

## 2017-11-27 ENCOUNTER — Ambulatory Visit (HOSPITAL_BASED_OUTPATIENT_CLINIC_OR_DEPARTMENT_OTHER): Payer: 59 | Admitting: Anesthesiology

## 2017-11-27 ENCOUNTER — Ambulatory Visit (HOSPITAL_BASED_OUTPATIENT_CLINIC_OR_DEPARTMENT_OTHER)
Admission: RE | Admit: 2017-11-27 | Discharge: 2017-11-27 | Disposition: A | Discharge status: Home / Self Care | Payer: 59 | Source: Ambulatory Visit | Attending: Surgery | Admitting: Surgery

## 2017-11-27 ENCOUNTER — Other Ambulatory Visit: Payer: Self-pay

## 2017-11-27 DIAGNOSIS — Z809 Family history of malignant neoplasm, unspecified: Secondary | ICD-10-CM | POA: Insufficient documentation

## 2017-11-27 DIAGNOSIS — K4090 Unilateral inguinal hernia, without obstruction or gangrene, not specified as recurrent: Secondary | ICD-10-CM

## 2017-11-27 DIAGNOSIS — Z836 Family history of other diseases of the respiratory system: Secondary | ICD-10-CM | POA: Insufficient documentation

## 2017-11-27 DIAGNOSIS — E7800 Pure hypercholesterolemia, unspecified: Secondary | ICD-10-CM | POA: Diagnosis not present

## 2017-11-27 DIAGNOSIS — Z8249 Family history of ischemic heart disease and other diseases of the circulatory system: Secondary | ICD-10-CM | POA: Insufficient documentation

## 2017-11-27 DIAGNOSIS — E785 Hyperlipidemia, unspecified: Secondary | ICD-10-CM | POA: Diagnosis not present

## 2017-11-27 DIAGNOSIS — Z85820 Personal history of malignant melanoma of skin: Secondary | ICD-10-CM | POA: Insufficient documentation

## 2017-11-27 DIAGNOSIS — Z87891 Personal history of nicotine dependence: Secondary | ICD-10-CM | POA: Diagnosis not present

## 2017-11-27 DIAGNOSIS — J309 Allergic rhinitis, unspecified: Secondary | ICD-10-CM | POA: Diagnosis not present

## 2017-11-27 DIAGNOSIS — I10 Essential (primary) hypertension: Secondary | ICD-10-CM | POA: Diagnosis not present

## 2017-11-27 DIAGNOSIS — K219 Gastro-esophageal reflux disease without esophagitis: Secondary | ICD-10-CM | POA: Insufficient documentation

## 2017-11-27 DIAGNOSIS — Z79899 Other long term (current) drug therapy: Secondary | ICD-10-CM | POA: Insufficient documentation

## 2017-11-27 DIAGNOSIS — M069 Rheumatoid arthritis, unspecified: Secondary | ICD-10-CM | POA: Insufficient documentation

## 2017-11-27 DIAGNOSIS — Z808 Family history of malignant neoplasm of other organs or systems: Secondary | ICD-10-CM | POA: Diagnosis not present

## 2017-11-27 HISTORY — DX: Essential (primary) hypertension: I10

## 2017-11-27 HISTORY — DX: Personal history of diseases of the skin and subcutaneous tissue: Z87.2

## 2017-11-27 HISTORY — DX: Rheumatoid arthritis, unspecified: M06.9

## 2017-11-27 HISTORY — DX: Unilateral inguinal hernia, without obstruction or gangrene, not specified as recurrent: K40.90

## 2017-11-27 HISTORY — PX: INSERTION OF MESH: SHX5868

## 2017-11-27 HISTORY — DX: Malignant (primary) neoplasm, unspecified: C80.1

## 2017-11-27 HISTORY — PX: INGUINAL HERNIA REPAIR: SHX194

## 2017-11-27 LAB — POCT I-STAT, CHEM 8
BUN: 17 mg/dL (ref 6–20)
CHLORIDE: 103 mmol/L (ref 101–111)
CREATININE: 0.7 mg/dL (ref 0.61–1.24)
Calcium, Ion: 1.3 mmol/L (ref 1.15–1.40)
Glucose, Bld: 96 mg/dL (ref 65–99)
HEMATOCRIT: 45 % (ref 39.0–52.0)
HEMOGLOBIN: 15.3 g/dL (ref 13.0–17.0)
POTASSIUM: 4.1 mmol/L (ref 3.5–5.1)
Sodium: 141 mmol/L (ref 135–145)
TCO2: 25 mmol/L (ref 22–32)

## 2017-11-27 SURGERY — REPAIR, HERNIA, INGUINAL, ADULT
Anesthesia: General | Laterality: Right

## 2017-11-27 MED ORDER — MIDAZOLAM HCL 2 MG/2ML IJ SOLN
INTRAMUSCULAR | Status: DC | PRN
  Administered 2017-11-27: 2 mg via INTRAVENOUS

## 2017-11-27 MED ORDER — ACETAMINOPHEN 500 MG PO TABS
1000.0000 mg | ORAL_TABLET | Freq: Once | ORAL | Status: AC
  Administered 2017-11-27: 1000 mg via ORAL
  Filled 2017-11-27: qty 2

## 2017-11-27 MED ORDER — CEFAZOLIN SODIUM-DEXTROSE 2-4 GM/100ML-% IV SOLN
INTRAVENOUS | Status: AC
  Filled 2017-11-27: qty 100

## 2017-11-27 MED ORDER — CEFAZOLIN SODIUM-DEXTROSE 2-4 GM/100ML-% IV SOLN
2.0000 g | INTRAVENOUS | Status: AC
  Administered 2017-11-27: 2 g via INTRAVENOUS
  Filled 2017-11-27: qty 100

## 2017-11-27 MED ORDER — KETOROLAC TROMETHAMINE 30 MG/ML IJ SOLN
INTRAMUSCULAR | Status: DC | PRN
  Administered 2017-11-27: 30 mg via INTRAVENOUS

## 2017-11-27 MED ORDER — GABAPENTIN 300 MG PO CAPS
ORAL_CAPSULE | ORAL | Status: AC
  Filled 2017-11-27: qty 1

## 2017-11-27 MED ORDER — CLONIDINE HCL 0.2 MG PO TABS
0.2000 mg | ORAL_TABLET | Freq: Once | ORAL | Status: AC
  Administered 2017-11-27: 0.2 mg via ORAL
  Filled 2017-11-27 (×2): qty 1

## 2017-11-27 MED ORDER — PROMETHAZINE HCL 25 MG/ML IJ SOLN
6.2500 mg | INTRAMUSCULAR | Status: DC | PRN
  Filled 2017-11-27: qty 1

## 2017-11-27 MED ORDER — DEXAMETHASONE SODIUM PHOSPHATE 10 MG/ML IJ SOLN
INTRAMUSCULAR | Status: DC | PRN
  Administered 2017-11-27: 10 mg via INTRAVENOUS

## 2017-11-27 MED ORDER — HYDROCODONE-ACETAMINOPHEN 5-325 MG PO TABS: 1.0000 | ORAL_TABLET | Freq: Four times a day (QID) | ORAL | 0 refills | Status: DC | PRN

## 2017-11-27 MED ORDER — PROPOFOL 10 MG/ML IV BOLUS
INTRAVENOUS | Status: AC
  Filled 2017-11-27: qty 20

## 2017-11-27 MED ORDER — SUGAMMADEX SODIUM 200 MG/2ML IV SOLN
INTRAVENOUS | Status: AC
  Filled 2017-11-27: qty 2

## 2017-11-27 MED ORDER — ACETAMINOPHEN 500 MG PO TABS
ORAL_TABLET | ORAL | Status: AC
  Filled 2017-11-27: qty 2

## 2017-11-27 MED ORDER — PROPOFOL 10 MG/ML IV BOLUS
INTRAVENOUS | Status: DC | PRN
  Administered 2017-11-27: 150 mg via INTRAVENOUS
  Administered 2017-11-27 (×2): 50 mg via INTRAVENOUS

## 2017-11-27 MED ORDER — BUPIVACAINE HCL 0.5 % IJ SOLN
INTRAMUSCULAR | Status: DC | PRN
  Administered 2017-11-27: 20 mL

## 2017-11-27 MED ORDER — ONDANSETRON HCL 4 MG/2ML IJ SOLN: INTRAMUSCULAR | Status: DC | PRN

## 2017-11-27 MED ORDER — ONDANSETRON HCL 4 MG/2ML IJ SOLN
INTRAMUSCULAR | Status: DC | PRN
  Administered 2017-11-27: 4 mg via INTRAVENOUS

## 2017-11-27 MED ORDER — BUPIVACAINE HCL (PF) 0.5 % IJ SOLN
INTRAMUSCULAR | Status: AC
  Filled 2017-11-27: qty 30

## 2017-11-27 MED ORDER — PHENYLEPHRINE 40 MCG/ML (10ML) SYRINGE FOR IV PUSH (FOR BLOOD PRESSURE SUPPORT)
PREFILLED_SYRINGE | INTRAVENOUS | Status: DC | PRN
  Administered 2017-11-27 (×2): 40 ug via INTRAVENOUS

## 2017-11-27 MED ORDER — DEXAMETHASONE SODIUM PHOSPHATE 10 MG/ML IJ SOLN
INTRAMUSCULAR | Status: AC
  Filled 2017-11-27: qty 1

## 2017-11-27 MED ORDER — LACTATED RINGERS IV SOLN
INTRAVENOUS | Status: DC
  Administered 2017-11-27 (×2): via INTRAVENOUS
  Filled 2017-11-27: qty 1000

## 2017-11-27 MED ORDER — MEPERIDINE HCL 25 MG/ML IJ SOLN
6.2500 mg | INTRAMUSCULAR | Status: DC | PRN
  Filled 2017-11-27: qty 1

## 2017-11-27 MED ORDER — ACETAMINOPHEN 10 MG/ML IV SOLN
1000.0000 mg | Freq: Once | INTRAVENOUS | Status: DC | PRN
  Filled 2017-11-27: qty 100

## 2017-11-27 MED ORDER — ONDANSETRON HCL 4 MG/2ML IJ SOLN
INTRAMUSCULAR | Status: AC
  Filled 2017-11-27: qty 2

## 2017-11-27 MED ORDER — CHLORHEXIDINE GLUCONATE CLOTH 2 % EX PADS
6.0000 | MEDICATED_PAD | Freq: Once | CUTANEOUS | Status: DC
  Filled 2017-11-27: qty 6

## 2017-11-27 MED ORDER — BUPIVACAINE HCL (PF) 0.25 % IJ SOLN
INTRAMUSCULAR | Status: AC
  Filled 2017-11-27: qty 30

## 2017-11-27 MED ORDER — MIDAZOLAM HCL 2 MG/2ML IJ SOLN
INTRAMUSCULAR | Status: AC
  Filled 2017-11-27: qty 2

## 2017-11-27 MED ORDER — GABAPENTIN 300 MG PO CAPS
300.0000 mg | ORAL_CAPSULE | Freq: Once | ORAL | Status: AC
  Administered 2017-11-27: 300 mg via ORAL
  Filled 2017-11-27: qty 1

## 2017-11-27 MED ORDER — HYDROCODONE-ACETAMINOPHEN 7.5-325 MG PO TABS
1.0000 | ORAL_TABLET | Freq: Once | ORAL | Status: DC | PRN
  Filled 2017-11-27: qty 1

## 2017-11-27 MED ORDER — PHENYLEPHRINE 40 MCG/ML (10ML) SYRINGE FOR IV PUSH (FOR BLOOD PRESSURE SUPPORT)
PREFILLED_SYRINGE | INTRAVENOUS | Status: AC
  Filled 2017-11-27: qty 10

## 2017-11-27 MED ORDER — FENTANYL CITRATE (PF) 100 MCG/2ML IJ SOLN
INTRAMUSCULAR | Status: DC | PRN
  Administered 2017-11-27: 100 ug via INTRAVENOUS

## 2017-11-27 MED ORDER — HYDROMORPHONE HCL 1 MG/ML IJ SOLN
0.2500 mg | INTRAMUSCULAR | Status: DC | PRN
  Filled 2017-11-27: qty 0.5

## 2017-11-27 MED ORDER — ROCURONIUM BROMIDE 10 MG/ML (PF) SYRINGE
PREFILLED_SYRINGE | INTRAVENOUS | Status: AC
  Filled 2017-11-27: qty 5

## 2017-11-27 MED ORDER — FENTANYL CITRATE (PF) 100 MCG/2ML IJ SOLN
INTRAMUSCULAR | Status: AC
  Filled 2017-11-27: qty 2

## 2017-11-27 MED ORDER — LIDOCAINE 2% (20 MG/ML) 5 ML SYRINGE
INTRAMUSCULAR | Status: DC | PRN
  Administered 2017-11-27: 100 mg via INTRAVENOUS

## 2017-11-27 MED ORDER — LIDOCAINE 2% (20 MG/ML) 5 ML SYRINGE
INTRAMUSCULAR | Status: AC
  Filled 2017-11-27: qty 5

## 2017-11-27 MED FILL — HYDROCODON-APAP 5-325: 5-325 | 3 days supply | Qty: 30 | Fill #0

## 2017-11-27 SURGICAL SUPPLY — 59 items
BENZOIN TINCTURE PRP APPL 2/3 (GAUZE/BANDAGES/DRESSINGS) IMPLANT
BLADE CLIPPER SURG (BLADE) ×3 IMPLANT
BLADE HEX COATED 2.75 (ELECTRODE) ×3 IMPLANT
BLADE SURG 11 STRL SS (BLADE) ×3 IMPLANT
BLADE SURG 15 STRL LF DISP TIS (BLADE) ×1 IMPLANT
BLADE SURG 15 STRL SS (BLADE) ×2
CANISTER SUCTION 1200CC (MISCELLANEOUS) IMPLANT
CHLORAPREP W/TINT 26ML (MISCELLANEOUS) ×3 IMPLANT
CLEANER CAUTERY TIP 5X5 PAD (MISCELLANEOUS) IMPLANT
CLOSURE WOUND 1/2 X4 (GAUZE/BANDAGES/DRESSINGS) ×1
CLOTH BEACON ORANGE TIMEOUT ST (SAFETY) ×3 IMPLANT
COVER BACK TABLE 60X90IN (DRAPES) ×3 IMPLANT
COVER MAYO STAND STRL (DRAPES) ×3 IMPLANT
DECANTER SPIKE VIAL GLASS SM (MISCELLANEOUS) IMPLANT
DERMABOND ADVANCED (GAUZE/BANDAGES/DRESSINGS)
DERMABOND ADVANCED .7 DNX12 (GAUZE/BANDAGES/DRESSINGS) IMPLANT
DRAIN PENROSE 18X1/2 LTX STRL (DRAIN) ×3 IMPLANT
DRAPE LAPAROTOMY 100X72 PEDS (DRAPES) IMPLANT
DRAPE LAPAROTOMY TRNSV 102X78 (DRAPE) ×3 IMPLANT
DRAPE UTILITY XL STRL (DRAPES) ×3 IMPLANT
ELECT REM PT RETURN 9FT ADLT (ELECTROSURGICAL) ×3
ELECTRODE REM PT RTRN 9FT ADLT (ELECTROSURGICAL) ×1 IMPLANT
GAUZE SPONGE 4X4 12PLY STRL (GAUZE/BANDAGES/DRESSINGS) ×3 IMPLANT
GLOVE SURG ORTHO 8.0 STRL STRW (GLOVE) ×3 IMPLANT
GOWN STRL REUS W/ TWL LRG LVL3 (GOWN DISPOSABLE) ×1 IMPLANT
GOWN STRL REUS W/ TWL XL LVL3 (GOWN DISPOSABLE) ×1 IMPLANT
GOWN STRL REUS W/TWL LRG LVL3 (GOWN DISPOSABLE) ×2
GOWN STRL REUS W/TWL XL LVL3 (GOWN DISPOSABLE) ×2
KIT RM TURNOVER CYSTO AR (KITS) ×3 IMPLANT
MANIFOLD NEPTUNE II (INSTRUMENTS) IMPLANT
MESH ULTRAPRO 3X6 7.6X15CM (Mesh General) ×3 IMPLANT
NEEDLE HYPO 25X1 1.5 SAFETY (NEEDLE) ×3 IMPLANT
NS IRRIG 500ML POUR BTL (IV SOLUTION) ×3 IMPLANT
PACK BASIN DAY SURGERY FS (CUSTOM PROCEDURE TRAY) ×3 IMPLANT
PAD CLEANER CAUTERY TIP 5X5 (MISCELLANEOUS)
PAD PREP 24X48 CUFFED NSTRL (MISCELLANEOUS) IMPLANT
PENCIL BUTTON HOLSTER BLD 10FT (ELECTRODE) ×3 IMPLANT
SPONGE GAUZE 4X4 12PLY STER LF (GAUZE/BANDAGES/DRESSINGS) IMPLANT
SPONGE LAP 18X18 X RAY DECT (DISPOSABLE) IMPLANT
SPONGE LAP 4X18 X RAY DECT (DISPOSABLE) ×3 IMPLANT
STRIP CLOSURE SKIN 1/2X4 (GAUZE/BANDAGES/DRESSINGS) ×2 IMPLANT
SUT MNCRL AB 4-0 PS2 18 (SUTURE) ×3 IMPLANT
SUT NOVA 0 T19/GS 22DT (SUTURE) ×3 IMPLANT
SUT NOVA NAB GS-22 2 0 T19 (SUTURE) ×6 IMPLANT
SUT PROLENE 0 CT 1 CR/8 (SUTURE) IMPLANT
SUT SILK 2 0 SH (SUTURE) IMPLANT
SUT SILK 2 0 TIES 17X18 (SUTURE)
SUT SILK 2-0 18XBRD TIE BLK (SUTURE) IMPLANT
SUT SILK 3 0 (SUTURE) ×2
SUT SILK 3-0 18XBRD TIE 12 (SUTURE) ×1 IMPLANT
SUT VIC AB 3-0 SH 18 (SUTURE) ×6 IMPLANT
SUT VICRYL 4-0 PS2 18IN ABS (SUTURE) IMPLANT
SYR BULB 3OZ (MISCELLANEOUS) ×3 IMPLANT
SYR CONTROL 10ML LL (SYRINGE) ×3 IMPLANT
TOWEL OR 17X24 6PK STRL BLUE (TOWEL DISPOSABLE) ×6 IMPLANT
TUBE CONNECTING 12'X1/4 (SUCTIONS)
TUBE CONNECTING 12X1/4 (SUCTIONS) IMPLANT
WATER STERILE IRR 500ML POUR (IV SOLUTION) IMPLANT
YANKAUER SUCT BULB TIP NO VENT (SUCTIONS) IMPLANT

## 2017-11-27 NOTE — Interval H&P Note (Signed)
History and Physical Interval Note:  11/27/2017 10:58 AM  Todd Valdez  has presented today for surgery, with the diagnosis of right inguinal hernia.  The various methods of treatment have been discussed with the patient and family. After consideration of risks, benefits and other options for treatment, the patient has consented to    Procedure(s): OPEN RIGHT INGUINAL HERNIA REPAIR (Right) INSERTION OF MESH (Right) as a surgical intervention .    The patient's history has been reviewed, patient examined, no change in status, stable for surgery.  I have reviewed the patient's chart and labs.  Questions were answered to the patient's satisfaction.    Armandina Gemma, [place] [facility] Office: [place]

## 2017-11-27 NOTE — Anesthesia Postprocedure Evaluation (Signed)
Anesthesia Post Note  Patient: Todd Valdez  Procedure(s) Performed: OPEN RIGHT INGUINAL HERNIA REPAIR (Right ) INSERTION OF MESH (Right )     Patient location during evaluation: PACU Anesthesia Type: General Level of consciousness: awake and alert Pain management: pain level controlled Vital Signs Assessment: post-procedure vital signs reviewed and stable Respiratory status: spontaneous breathing, nonlabored ventilation, respiratory function stable and patient connected to nasal cannula oxygen Cardiovascular status: blood pressure returned to baseline and stable Postop Assessment: no apparent nausea or vomiting Anesthetic complications: no    Last Vitals:  Vitals:   11/27/17 1345 11/27/17 1420  BP: 106/67 119/67  Pulse: 72 (!) 58  Resp: 18 16  Temp:  36.5 C  SpO2: 97% 100%    Last Pain:  Vitals:   11/27/17 0840  TempSrc: Oral                 Barnet Glasgow

## 2017-11-27 NOTE — Anesthesia Postprocedure Evaluation (Signed)
Anesthesia Post Note  Patient: Todd Valdez  Procedure(s) Performed: OPEN RIGHT INGUINAL HERNIA REPAIR (Right ) INSERTION OF MESH (Right )     Patient location during evaluation: PACU Anesthesia Type: General Level of consciousness: awake and alert and oriented Pain management: pain level controlled Vital Signs Assessment: post-procedure vital signs reviewed and stable Respiratory status: spontaneous breathing, nonlabored ventilation and respiratory function stable Cardiovascular status: blood pressure returned to baseline and stable Postop Assessment: no apparent nausea or vomiting Anesthetic complications: no    Last Vitals:  Vitals:   11/27/17 1242 11/27/17 1245  BP:  115/78  Pulse: 76 74  Resp: 13 (!) 9  Temp:    SpO2: 99% 98%    Last Pain:  Vitals:   11/27/17 0840  TempSrc: Oral                 Shauntay Brunelli A.

## 2017-11-27 NOTE — Anesthesia Procedure Notes (Signed)
Procedure Name: LMA Insertion Date/Time: 11/27/2017 11:04 AM Performed by: Wanita Chamberlain, CRNA Pre-anesthesia Checklist: Patient identified, Timeout performed, Emergency Drugs available, Suction available and Patient being monitored Patient Re-evaluated:Patient Re-evaluated prior to induction Oxygen Delivery Method: Circle system utilized Preoxygenation: Pre-oxygenation with 100% oxygen Induction Type: IV induction Ventilation: Mask ventilation without difficulty LMA: LMA inserted LMA Size: 4.0 Number of attempts: 1 Placement Confirmation: breath sounds checked- equal and bilateral,  CO2 detector and positive ETCO2 Tube secured with: Tape Dental Injury: Teeth and Oropharynx as per pre-operative assessment

## 2017-11-27 NOTE — Discharge Instructions (Signed)

## 2017-11-27 NOTE — Op Note (Addendum)
Inguinal Hernia, Open, Procedure Note  Pre-operative Diagnosis:  Right inguinal hernia, reducible  Post-operative Diagnosis: same  Surgeon:  Earnstine Regal, MD, FACS  Anesthesia:  General  Preparation:  Chlora-prep  Estimated Blood Loss: minimal  Complications:  none  Indications: Patient is referred by Dr. Dianah Field for surgical evaluation and management of right inguinal hernia. Patient had first noted a hernia approximately 4 years ago. It has always been reducible. It has gradually increased in size. Patient is recently retired from an occupation that required a moderate amount of physical exertion and lifting. Patient has had no signs or symptoms of intestinal obstruction. He has had no prior hernia repairs. He has had no other abdominal surgery. Patient does have rheumatoid arthritis and is on multiple medications. Patient would like to proceed with inguinal hernia repair in the near future. He has discussed this with his rheumatologist and has discussed how to manage his medications during the perioperative interval.   Procedure Details  The patient was evaluated in the holding area. All of the patient's questions were answered and the proposed procedure was confirmed. The site of the procedure was properly marked. The patient was taken to the Operating Room, identified by name, and the procedure verified as inguinal hernia repair.  The patient was placed in the supine position and underwent induction of anesthesia. A "Time Out" was performed per routine. The lower abdomen and groin were prepped and draped in the usual aseptic fashion.  After ascertaining that an adequate level of anesthesia had been obtained, an incision was made in the groin with a #10 blade.  Dissection was carried through the subcutaneous tissues and hemostasis obtained with the electrocautery.  A Gelpi retractor was placed for exposure.  The external oblique fascia was incised in line with it's fibers  and extended through the external inguinal ring.  The cord structures were dissected out of the inguinal canal and encircled with a Penrose drain.  The floor of the inguinal canal was dissected out.  There was a large direct inguinal hernia defect.  Hernia sac was dissected away from the cord structures and reduced.  The floor was closed with interrupted 0-Novofil figure-of-eight sutures.  The cord was explored and there was no sign of indirect hernia sac.  The floor of the inguinal canal was reconstructed with Ethicon Ultrapro mesh cut to the appropriate dimensions.  It was secured to the pubic tubercle with a 2-0 Novafil suture and along the inguinal ligament with a running 2-0 Novafil suture.  Mesh was split to accommodate the cord structures.  The superior margin of the mesh was secured to the transversalis and internal oblique musculature with interrupted 2-0 Novafil sutures.  The tails of the mesh were overlapped lateral to the cord structures and secured to the inguinal ligament with interrupted 2-0 Novafil sutures to recreate the internal inguinal ring.  Cord structures were returned to the inguinal canal.  Local anesthetic was infiltrated throughout the field.  External oblique fascia was closed with interrupted 3-0 Vicryl sutures.  Subcutaneous tissues were closed with interrupted 3-0 Vicryl sutures.  Skin was anesthetized with local anesthetic, and the skin edges were re-approximated with a running 4-0 Monocryl suture.  Wound was washed and dried and steristrips were applied.  A dry gauze dressing was applied.  Instrument, sponge, and needle counts were correct prior to closure and at the conclusion of the case.  The patient tolerated the procedure well.  The patient was awakened from anesthesia and brought to the  recovery room in stable condition.  Armandina Gemma, MD [facility] Office: [phone]

## 2017-11-27 NOTE — Anesthesia Preprocedure Evaluation (Addendum)
Anesthesia Evaluation  Patient identified by MRN, date of birth, ID band Patient awake    Airway Mallampati: II  TM Distance: >3 FB Neck ROM: Full    Dental no notable dental hx. (+) Teeth Intact, Dental Advisory Given,    Pulmonary former smoker,    Pulmonary exam normal        Cardiovascular Exercise Tolerance: Good hypertension,  Rhythm:Regular Rate:Normal     Neuro/Psych    GI/Hepatic GERD  Medicated,Patient received Oral Contrast Agents,  Endo/Other  negative endocrine ROS  Renal/GU negative Renal ROS     Musculoskeletal  (+) Arthritis , Rheumatoid disorders and steroids,    Abdominal   Peds  Hematology   Anesthesia Other Findings   Reproductive/Obstetrics                           Anesthesia Physical Anesthesia Plan  ASA: III  Anesthesia Plan: General   Post-op Pain Management:    Induction: Intravenous  PONV Risk Score and Plan: Treatment may vary due to age or medical condition, Ondansetron and Dexamethasone  Airway Management Planned: LMA  Additional Equipment:   Intra-op Plan:   Post-operative Plan:   Informed Consent: I have reviewed the patients History and Physical, chart, labs and discussed the procedure including the risks, benefits and alternatives for the proposed anesthesia with the patient or authorized representative who has indicated his/her understanding and acceptance.     Plan Discussed with:   Anesthesia Plan Comments:         Anesthesia Quick Evaluation

## 2017-11-27 NOTE — Transfer of Care (Signed)
Immediate Anesthesia Transfer of Care Note  Patient: Todd Valdez  Procedure(s) Performed: OPEN RIGHT INGUINAL HERNIA REPAIR (Right ) INSERTION OF MESH (Right )  Patient Location: PACU  Anesthesia Type:General  Level of Consciousness: awake, alert , oriented and patient cooperative  Airway & Oxygen Therapy: Patient Spontanous Breathing and Patient connected to nasal cannula oxygen  Post-op Assessment: Report given to RN and Post -op Vital signs reviewed and stable  Post vital signs: Reviewed and stable  Last Vitals:  Vitals:   11/27/17 1330 11/27/17 1345  BP: 104/71 106/67  Pulse: 72 72  Resp: 13 18  Temp:    SpO2: 90% 97%    Last Pain:  Vitals:   11/27/17 0840  TempSrc: Oral      Patients Stated Pain Goal: 5 (42/10/31 2811)  Complications: No apparent anesthesia complications

## 2017-11-28 ENCOUNTER — Encounter (HOSPITAL_BASED_OUTPATIENT_CLINIC_OR_DEPARTMENT_OTHER): Payer: Self-pay | Admitting: Surgery

## 2017-12-05 DIAGNOSIS — H179 Unspecified corneal scar and opacity: Secondary | ICD-10-CM | POA: Diagnosis not present

## 2017-12-05 DIAGNOSIS — H16403 Unspecified corneal neovascularization, bilateral: Secondary | ICD-10-CM | POA: Diagnosis not present

## 2017-12-05 DIAGNOSIS — H18463 Peripheral corneal degeneration, bilateral: Secondary | ICD-10-CM | POA: Diagnosis not present

## 2017-12-05 DIAGNOSIS — M069 Rheumatoid arthritis, unspecified: Secondary | ICD-10-CM | POA: Diagnosis not present

## 2017-12-05 DIAGNOSIS — H0015 Chalazion left lower eyelid: Secondary | ICD-10-CM | POA: Diagnosis not present

## 2017-12-05 MED FILL — DOXYCYCLINE MONOHYDRATE 50: 50 | 60 days supply | Qty: 60 | Fill #0

## 2017-12-10 DIAGNOSIS — M0589 Other rheumatoid arthritis with rheumatoid factor of multiple sites: Secondary | ICD-10-CM | POA: Diagnosis not present

## 2017-12-14 MED FILL — ORENCIA 250 MG SOLR: 250 | 28 days supply | Qty: 3 | Fill #3

## 2018-01-07 DIAGNOSIS — M0589 Other rheumatoid arthritis with rheumatoid factor of multiple sites: Secondary | ICD-10-CM | POA: Diagnosis not present

## 2018-01-07 DIAGNOSIS — Z79899 Other long term (current) drug therapy: Secondary | ICD-10-CM | POA: Diagnosis not present

## 2018-01-11 MED FILL — ORENCIA 250 MG SOLR: 250 | 28 days supply | Qty: 3 | Fill #4

## 2018-01-16 MED FILL — FENOFIBRATE 160 MG TABLET: 160 | 90 days supply | Qty: 90 | Fill #1

## 2018-01-16 MED FILL — LISINOPRIL-HCTZ 20-25 MG TA: 20-25 | 90 days supply | Qty: 90 | Fill #1

## 2018-01-16 MED FILL — ATORVASTATIN 40 MG TABLET: 40 | 90 days supply | Qty: 90 | Fill #1

## 2018-01-16 MED FILL — predniSONE 5 MG TABS: 5 | 30 days supply | Qty: 60 | Fill #1

## 2018-01-16 MED FILL — ESOMEPRAZOLE MAG DR 40 MG C: 40 | 90 days supply | Qty: 90 | Fill #1

## 2018-01-18 DIAGNOSIS — H18463 Peripheral corneal degeneration, bilateral: Secondary | ICD-10-CM | POA: Diagnosis not present

## 2018-01-18 DIAGNOSIS — M069 Rheumatoid arthritis, unspecified: Secondary | ICD-10-CM | POA: Diagnosis not present

## 2018-01-18 DIAGNOSIS — H179 Unspecified corneal scar and opacity: Secondary | ICD-10-CM | POA: Diagnosis not present

## 2018-01-18 DIAGNOSIS — H0015 Chalazion left lower eyelid: Secondary | ICD-10-CM | POA: Diagnosis not present

## 2018-01-18 DIAGNOSIS — H16403 Unspecified corneal neovascularization, bilateral: Secondary | ICD-10-CM | POA: Diagnosis not present

## 2018-02-06 DIAGNOSIS — M0589 Other rheumatoid arthritis with rheumatoid factor of multiple sites: Secondary | ICD-10-CM | POA: Diagnosis not present

## 2018-02-11 MED FILL — ORENCIA 250 MG SOLR: 250 | 28 days supply | Qty: 3 | Fill #5

## 2018-02-12 DIAGNOSIS — Z6828 Body mass index (BMI) 28.0-28.9, adult: Secondary | ICD-10-CM | POA: Diagnosis not present

## 2018-02-12 DIAGNOSIS — M150 Primary generalized (osteo)arthritis: Secondary | ICD-10-CM | POA: Diagnosis not present

## 2018-02-12 DIAGNOSIS — M3500 Sicca syndrome, unspecified: Secondary | ICD-10-CM | POA: Diagnosis not present

## 2018-02-12 DIAGNOSIS — H16002 Unspecified corneal ulcer, left eye: Secondary | ICD-10-CM | POA: Diagnosis not present

## 2018-02-12 DIAGNOSIS — E663 Overweight: Secondary | ICD-10-CM | POA: Diagnosis not present

## 2018-02-12 DIAGNOSIS — M0589 Other rheumatoid arthritis with rheumatoid factor of multiple sites: Secondary | ICD-10-CM | POA: Diagnosis not present

## 2018-02-14 MED FILL — BD TB SYRINGE 27GX1/2": 27G X 1/2" | 28 days supply | Qty: 4 | Fill #1

## 2018-02-14 MED FILL — BD TB SYRINGE 27GX1/2: 27G X 1/2" | 28 days supply | Qty: 4 | Fill #1

## 2018-02-14 MED FILL — METHOTREXATE 25 MG/ML VIAL: 50 | 28 days supply | Qty: 4 | Fill #1

## 2018-03-06 DIAGNOSIS — M0589 Other rheumatoid arthritis with rheumatoid factor of multiple sites: Secondary | ICD-10-CM | POA: Diagnosis not present

## 2018-03-11 MED FILL — ORENCIA 250 MG SOLR: 250 | 28 days supply | Qty: 3 | Fill #6

## 2018-04-03 DIAGNOSIS — M0589 Other rheumatoid arthritis with rheumatoid factor of multiple sites: Secondary | ICD-10-CM | POA: Diagnosis not present

## 2018-04-03 DIAGNOSIS — Z79899 Other long term (current) drug therapy: Secondary | ICD-10-CM | POA: Diagnosis not present

## 2018-04-12 MED FILL — predniSONE 5 MG TABS: 5 | 30 days supply | Qty: 60 | Fill #2

## 2018-04-12 MED FILL — ORENCIA 250 MG SOLR: 250 | 28 days supply | Qty: 3 | Fill #7

## 2018-04-15 MED FILL — DOXYCYCLINE MONOHYDRATE 50: 50 | 60 days supply | Qty: 60 | Fill #1

## 2018-04-15 MED FILL — ESOMEPRAZOLE MAG DR 40 MG C: 40 | 90 days supply | Qty: 90 | Fill #2

## 2018-04-26 DIAGNOSIS — M069 Rheumatoid arthritis, unspecified: Secondary | ICD-10-CM | POA: Diagnosis not present

## 2018-04-26 DIAGNOSIS — H16403 Unspecified corneal neovascularization, bilateral: Secondary | ICD-10-CM | POA: Diagnosis not present

## 2018-04-26 DIAGNOSIS — H18463 Peripheral corneal degeneration, bilateral: Secondary | ICD-10-CM | POA: Diagnosis not present

## 2018-04-26 DIAGNOSIS — H179 Unspecified corneal scar and opacity: Secondary | ICD-10-CM | POA: Diagnosis not present

## 2018-04-26 DIAGNOSIS — H0015 Chalazion left lower eyelid: Secondary | ICD-10-CM | POA: Diagnosis not present

## 2018-05-01 DIAGNOSIS — M0589 Other rheumatoid arthritis with rheumatoid factor of multiple sites: Secondary | ICD-10-CM | POA: Diagnosis not present

## 2018-05-02 MED FILL — ATORVASTATIN 40 MG TABLET: 40 | 90 days supply | Qty: 90 | Fill #2

## 2018-05-13 MED FILL — ORENCIA 250 MG SOLR: 250 | 28 days supply | Qty: 3 | Fill #8

## 2018-05-14 MED FILL — LISINOPRIL-HCTZ 20-25 MG TA: 20-25 | 90 days supply | Qty: 90 | Fill #2

## 2018-05-14 MED FILL — METHOTREXATE 25 MG/ML VIAL: 50 | 56 days supply | Qty: 8 | Fill #2

## 2018-05-14 MED FILL — BD TB SYRINGE 27GX1/2": 27G X 1/2" | 28 days supply | Qty: 4 | Fill #2

## 2018-05-14 MED FILL — BD TB SYRINGE 27GX1/2: 27G X 1/2" | 28 days supply | Qty: 4 | Fill #2

## 2018-05-22 DIAGNOSIS — E782 Mixed hyperlipidemia: Secondary | ICD-10-CM | POA: Diagnosis not present

## 2018-05-23 LAB — CBC
HCT: 46 % (ref 38.5–50.0)
Hemoglobin: 15.6 g/dL (ref 13.2–17.1)
MCH: 31.8 pg (ref 27.0–33.0)
MCHC: 33.9 g/dL (ref 32.0–36.0)
MCV: 93.9 fL (ref 80.0–100.0)
MPV: 10.8 fL (ref 7.5–12.5)
Platelets: 270 10*3/uL (ref 140–400)
RBC: 4.9 10*6/uL (ref 4.20–5.80)
RDW: 12.6 % (ref 11.0–15.0)
WBC: 7.9 10*3/uL (ref 3.8–10.8)

## 2018-05-23 LAB — TSH: TSH: 2.44 m[IU]/L (ref 0.40–4.50)

## 2018-05-23 LAB — COMPREHENSIVE METABOLIC PANEL WITH GFR
AG Ratio: 2 (calc) (ref 1.0–2.5)
ALT: 13 U/L (ref 9–46)
AST: 15 U/L (ref 10–35)
CO2: 26 mmol/L (ref 20–32)
Chloride: 104 mmol/L (ref 98–110)
Globulin: 2.2 g/dL (ref 1.9–3.7)
Glucose, Bld: 93 mg/dL (ref 65–99)
Total Bilirubin: 0.5 mg/dL (ref 0.2–1.2)

## 2018-05-23 LAB — LIPID PANEL W/REFLEX DIRECT LDL
Cholesterol: 152 mg/dL (ref <200)
HDL: 30 mg/dL — ABNORMAL LOW (ref >40)
LDL Cholesterol (Calc): 86 mg/dL (calc)
Non-HDL Cholesterol (Calc): 122 mg/dL (ref <130)
Total CHOL/HDL Ratio: 5.1 (calc) — ABNORMAL HIGH (ref <5.0)
Triglycerides: 272 mg/dL — ABNORMAL HIGH (ref <150)

## 2018-05-23 LAB — COMPREHENSIVE METABOLIC PANEL
Albumin: 4.3 g/dL (ref 3.6–5.1)
Alkaline phosphatase (APISO): 67 U/L (ref 40–115)
BUN: 19 mg/dL (ref 7–25)
Calcium: 10.3 mg/dL (ref 8.6–10.3)
Creat: 1.16 mg/dL (ref 0.70–1.33)
Potassium: 4.7 mmol/L (ref 3.5–5.3)
Sodium: 140 mmol/L (ref 135–146)
Total Protein: 6.5 g/dL (ref 6.1–8.1)

## 2018-05-23 LAB — VITAMIN D 25 HYDROXY (VIT D DEFICIENCY, FRACTURES): Vit D, 25-Hydroxy: 32 ng/mL (ref 30–100)

## 2018-05-23 LAB — HEMOGLOBIN A1C
Hgb A1c MFr Bld: 5.4 % of total Hgb (ref <5.7)
Mean Plasma Glucose: 108 (calc)
eAG (mmol/L): 6 (calc)

## 2018-05-29 DIAGNOSIS — M0589 Other rheumatoid arthritis with rheumatoid factor of multiple sites: Secondary | ICD-10-CM | POA: Diagnosis not present

## 2018-06-12 MED FILL — ORENCIA 250 MG SOLR: 250 | 28 days supply | Qty: 3 | Fill #9

## 2018-06-18 MED FILL — FENOFIBRATE 160 MG TABLET: 160 | 90 days supply | Qty: 90 | Fill #2

## 2018-06-18 MED FILL — predniSONE 5 MG TABS: 5 | 30 days supply | Qty: 60 | Fill #3

## 2018-06-26 DIAGNOSIS — M0589 Other rheumatoid arthritis with rheumatoid factor of multiple sites: Secondary | ICD-10-CM | POA: Diagnosis not present

## 2018-07-09 DIAGNOSIS — H16002 Unspecified corneal ulcer, left eye: Secondary | ICD-10-CM | POA: Diagnosis not present

## 2018-07-09 DIAGNOSIS — Z6829 Body mass index (BMI) 29.0-29.9, adult: Secondary | ICD-10-CM | POA: Diagnosis not present

## 2018-07-09 DIAGNOSIS — M3500 Sicca syndrome, unspecified: Secondary | ICD-10-CM | POA: Diagnosis not present

## 2018-07-09 DIAGNOSIS — M0589 Other rheumatoid arthritis with rheumatoid factor of multiple sites: Secondary | ICD-10-CM | POA: Diagnosis not present

## 2018-07-09 DIAGNOSIS — E663 Overweight: Secondary | ICD-10-CM | POA: Diagnosis not present

## 2018-07-09 DIAGNOSIS — M150 Primary generalized (osteo)arthritis: Secondary | ICD-10-CM | POA: Diagnosis not present

## 2018-07-12 MED FILL — ORENCIA 250 MG SOLR: 250 | 28 days supply | Qty: 3 | Fill #10

## 2018-07-12 MED FILL — ESOMEPRAZOLE MAG DR 40 MG C: 40 | 90 days supply | Qty: 90 | Fill #3

## 2018-07-12 MED FILL — DOXYCYCLINE MONOHYDRATE 50: 50 | 60 days supply | Qty: 60 | Fill #2

## 2018-07-24 DIAGNOSIS — M0589 Other rheumatoid arthritis with rheumatoid factor of multiple sites: Secondary | ICD-10-CM | POA: Diagnosis not present

## 2018-07-31 MED FILL — ATORVASTATIN 40 MG TABLET: 40 | 90 days supply | Qty: 90 | Fill #3

## 2018-08-08 MED FILL — ORENCIA 250 MG SOLR: 250 | 28 days supply | Qty: 3 | Fill #11

## 2018-08-12 MED FILL — predniSONE 5 MG TABS: 5 | 30 days supply | Qty: 60 | Fill #0

## 2018-08-20 MED FILL — LISINOPRIL-HCTZ 20-25 MG TA: 20-25 | 90 days supply | Qty: 90 | Fill #3

## 2018-08-21 DIAGNOSIS — M0589 Other rheumatoid arthritis with rheumatoid factor of multiple sites: Secondary | ICD-10-CM | POA: Diagnosis not present

## 2018-09-09 ENCOUNTER — Other Ambulatory Visit: Payer: Self-pay | Admitting: Internal Medicine

## 2018-09-09 ENCOUNTER — Other Ambulatory Visit: Payer: Self-pay | Admitting: Pharmacist

## 2018-09-09 MED ORDER — ABATACEPT 250 MG IV SOLR: INTRAVENOUS | 10 refills | Status: DC

## 2018-09-09 MED FILL — ORENCIA 250 MG SOLR: 250 | 28 days supply | Qty: 3 | Fill #0

## 2018-09-13 ENCOUNTER — Telehealth: Payer: Self-pay | Admitting: Pharmacist

## 2018-09-13 ENCOUNTER — Encounter: Payer: Self-pay | Admitting: Pharmacist

## 2018-09-13 ENCOUNTER — Ambulatory Visit (INDEPENDENT_AMBULATORY_CARE_PROVIDER_SITE_OTHER): Payer: 59 | Admitting: Pharmacist

## 2018-09-13 DIAGNOSIS — Z79899 Other long term (current) drug therapy: Secondary | ICD-10-CM

## 2018-09-13 NOTE — Progress Notes (Signed)
   S: Patient presents to [facility] for review of their specialty medication therapy.  Patient is currently taking Orencia for rheumatoid arthritis. Patient is managed by Dr. Amil Amen for this.   Adherence: denies any missed doses.   Efficacy: reports that it is still working very well for him  FDA-approved dosing: 750 mg every 4 weeks (gets infusion at [facility]).   Drug-drug interactions: none  Screenings: TB screening: completed per patient Hepatitis Screening: completed per patient Blood glucose: Orencia contains maltose which make falsely elevate glucose levels  Monitoring: S/sx of infection: denies.  S/sx of hypersensitivity: denies  Other adverse effects: denies  O:     Lab Results  Component Value Date   WBC 7.9 05/22/2018   HGB 15.6 05/22/2018   HCT 46.0 05/22/2018   MCV 93.9 05/22/2018   PLT 270 05/22/2018      Chemistry      Component Value Date/Time   NA 140 05/22/2018 0612   K 4.7 05/22/2018 0612   CL 104 05/22/2018 0612   CO2 26 05/22/2018 0612   BUN 19 05/22/2018 0612   CREATININE 1.16 05/22/2018 0612      Component Value Date/Time   CALCIUM 10.3 05/22/2018 0612   ALKPHOS 78 09/25/2016 0530   AST 15 05/22/2018 0612   ALT 13 05/22/2018 0612   BILITOT 0.5 05/22/2018 0612       A/P: 1. Medication review: Patient currently on [place] for the treatment of rheumatoid arthritis and is tolerating it well with continued improved control of RA. Reviewed the medication with the patient, including the following: Orencia is a selective T-cell costimulation blocker indicated for rheumatoid arthritis. The most common adverse effects are infections, headache, and injection site reactions. There is a possible adverse effect of increased risk of malignancy but it is not fully understood if this is due to the drug or the disease state itself. The patient was instructed to avoid use of live vaccinations without the approval of a physician.  No recommendations for any changes.  Christella Hartigan, PharmD, BCPS, BCACP, CPP Clinical Pharmacist Practitioner  [phone]

## 2018-09-13 NOTE — Telephone Encounter (Signed)
Called patient to schedule an appointment for the [employer] [facility]. I was unable to reach the patient so I left a HIPAA-compliant message requesting that the patient return my call.   

## 2018-09-18 DIAGNOSIS — M0589 Other rheumatoid arthritis with rheumatoid factor of multiple sites: Secondary | ICD-10-CM | POA: Diagnosis not present

## 2018-09-24 MED FILL — DOXYCYCLINE MONOHYDRATE 50: 50 | 60 days supply | Qty: 60 | Fill #3

## 2018-09-25 MED FILL — METHOTREXATE 25 MG/ML VIAL: 50 | 28 days supply | Qty: 4 | Fill #0

## 2018-09-25 MED FILL — BD TB SYRINGE 27GX1/2": 27G X 1/2" | 28 days supply | Qty: 4 | Fill #3

## 2018-09-25 MED FILL — BD TB SYRINGE 27GX1/2: 27G X 1/2" | 28 days supply | Qty: 4 | Fill #3

## 2018-10-04 ENCOUNTER — Other Ambulatory Visit: Payer: Self-pay | Admitting: Sports Medicine

## 2018-10-04 MED FILL — ESOMEPRAZOLE MAG DR 40 MG C: 40 | 90 days supply | Qty: 90 | Fill #0

## 2018-10-07 MED FILL — ORENCIA 250 MG SOLR: 250 | 28 days supply | Qty: 3 | Fill #1

## 2018-10-14 DIAGNOSIS — Z6828 Body mass index (BMI) 28.0-28.9, adult: Secondary | ICD-10-CM | POA: Diagnosis not present

## 2018-10-14 DIAGNOSIS — M150 Primary generalized (osteo)arthritis: Secondary | ICD-10-CM | POA: Diagnosis not present

## 2018-10-14 DIAGNOSIS — M0589 Other rheumatoid arthritis with rheumatoid factor of multiple sites: Secondary | ICD-10-CM | POA: Diagnosis not present

## 2018-10-14 DIAGNOSIS — E669 Obesity, unspecified: Secondary | ICD-10-CM | POA: Diagnosis not present

## 2018-10-14 DIAGNOSIS — M3500 Sicca syndrome, unspecified: Secondary | ICD-10-CM | POA: Diagnosis not present

## 2018-10-14 DIAGNOSIS — H16002 Unspecified corneal ulcer, left eye: Secondary | ICD-10-CM | POA: Diagnosis not present

## 2018-10-16 DIAGNOSIS — M0589 Other rheumatoid arthritis with rheumatoid factor of multiple sites: Secondary | ICD-10-CM | POA: Diagnosis not present

## 2018-10-24 MED FILL — predniSONE 5 MG TABS: 5 | 30 days supply | Qty: 60 | Fill #1

## 2018-10-25 ENCOUNTER — Ambulatory Visit (INDEPENDENT_AMBULATORY_CARE_PROVIDER_SITE_OTHER): Payer: 59 | Admitting: Sports Medicine

## 2018-10-25 ENCOUNTER — Ambulatory Visit (INDEPENDENT_AMBULATORY_CARE_PROVIDER_SITE_OTHER): Payer: 59

## 2018-10-25 ENCOUNTER — Encounter: Payer: Self-pay | Admitting: Sports Medicine

## 2018-10-25 VITALS — BP 117/73 | HR 80 | Ht 73.0 in | Wt 208.0 lb

## 2018-10-25 DIAGNOSIS — M545 Low back pain: Secondary | ICD-10-CM | POA: Diagnosis not present

## 2018-10-25 DIAGNOSIS — Z7952 Long term (current) use of systemic steroids: Secondary | ICD-10-CM

## 2018-10-25 DIAGNOSIS — I700 Atherosclerosis of aorta: Secondary | ICD-10-CM | POA: Diagnosis not present

## 2018-10-25 DIAGNOSIS — Z0000 Encounter for general adult medical examination without abnormal findings: Secondary | ICD-10-CM | POA: Diagnosis not present

## 2018-10-25 DIAGNOSIS — E782 Mixed hyperlipidemia: Secondary | ICD-10-CM | POA: Diagnosis not present

## 2018-10-25 DIAGNOSIS — M48061 Spinal stenosis, lumbar region without neurogenic claudication: Secondary | ICD-10-CM

## 2018-10-25 MED ORDER — CYCLOBENZAPRINE HCL 10 MG PO TABS: ORAL_TABLET | ORAL | 0 refills | Status: DC

## 2018-10-25 MED ORDER — NIACIN ER 500 MG PO TBCR: 500.0000 mg | EXTENDED_RELEASE_TABLET | Freq: Every day | ORAL | 3 refills | Status: DC

## 2018-10-25 MED ORDER — PREDNISONE 50 MG PO TABS: ORAL_TABLET | ORAL | 0 refills | Status: DC

## 2018-10-25 MED FILL — predniSONE 50 MG TABS: 50 | 5 days supply | Qty: 5 | Fill #0

## 2018-10-25 MED FILL — CYCLOBENZAPRINE HCL 10 MG T: 10 | 10 days supply | Qty: 30 | Fill #0

## 2018-10-25 MED FILL — NIACIN ER 500 MG TBCR: 500 | 100 days supply | Qty: 100 | Fill #0

## 2018-10-25 NOTE — Assessment & Plan Note (Signed)
Annual physical as above.  

## 2018-10-25 NOTE — Assessment & Plan Note (Signed)
Did have persistent hypertriglyceridemia. This improved with current medications but was still elevated. Adding niacin. Check lipids in 3 months.

## 2018-10-25 NOTE — Assessment & Plan Note (Signed)
With a recent flare. Prednisone 50 mg daily for 5 days then drop back down to the 5 mg daily dose, Flexeril, x-rays. Rehab exercises given. Return to see me in 4 to 6 weeks if not better and we will get an MRI for epidural planning.

## 2018-10-25 NOTE — Progress Notes (Signed)
Subjective:    CC: Annual physical exam  HPI:  Muscab returns, he is happy, healthy.  He is on a good regimen for his rheumatoid arthritis.  I reviewed the past medical history, family history, social history, surgical history, and allergies today and no changes were needed.  Please see the problem list section below in epic for further details.  Past Medical History: Past Medical History:  Diagnosis Date  . Arthritis   . Cancer ([place])    Melanoma on  back  . Diverticulitis   . GERD (gastroesophageal reflux disease)   . High triglycerides   . History of abscess of skin and subcutaneous tissue    right forearm, chin  . Hypertension   . RA (rheumatoid arthritis) ([place])   . Reflux esophagitis   . Right inguinal hernia    Past Surgical History: Past Surgical History:  Procedure Laterality Date  . bilateral feet reconstruction  10/2000  . COLONOSCOPY    . feet surgery    . INGUINAL HERNIA REPAIR Right 11/27/2017   Procedure: OPEN RIGHT INGUINAL HERNIA REPAIR;  Surgeon: Armandina Gemma, MD;  Location: [facility];  Service: General;  Laterality: Right;  . INSERTION OF MESH Right 11/27/2017   Procedure: INSERTION OF MESH;  Surgeon: Armandina Gemma, MD;  Location: [facility];  Service: General;  Laterality: Right;  . MELANOMA EXCISION    . UPPER GI ENDOSCOPY  12/2011   Social History: Social History   Socioeconomic History  . Marital status: Married    Spouse name: Not on file  . Number of children: Not on file  . Years of education: Not on file  . Highest education level: Not on file  Occupational History  . Not on file  Social Needs  . Financial resource strain: Not on file  . Food insecurity:    Worry: Not on file    Inability: Not on file  . Transportation needs:    Medical: Not on file    Non-medical: Not on file  Tobacco Use  . Smoking status: Former Smoker    Last attempt to quit: 02/07/1991    Years since quitting: 27.7  . Smokeless  tobacco: Former Systems developer    Quit date: 08/14/2009  Substance and Sexual Activity  . Alcohol use: No  . Drug use: No  . Sexual activity: Yes    Partners: Female    Comment: married.   Lifestyle  . Physical activity:    Days per week: Not on file    Minutes per session: Not on file  . Stress: Not on file  Relationships  . Social connections:    Talks on phone: Not on file    Gets together: Not on file    Attends religious service: Not on file    Active member of club or organization: Not on file    Attends meetings of clubs or organizations: Not on file    Relationship status: Not on file  Other Topics Concern  . Not on file  Social History Narrative  . Not on file   Family History: Family History  Problem Relation Age of Onset  . Hypertension Mother   . Hypertension Father    Allergies: No Known Allergies Medications: See med rec.  Review of Systems: No headache, visual changes, nausea, vomiting, diarrhea, constipation, dizziness, abdominal pain, skin rash, fevers, chills, night sweats, swollen lymph nodes, weight loss, chest pain, body aches, joint swelling, muscle aches, shortness of breath, mood changes, visual  or auditory hallucinations.  Objective:    General: Well Developed, well nourished, and in no acute distress.  Neuro: Alert and oriented x3, extra-ocular muscles intact, sensation grossly intact. Cranial nerves II through XII are intact, motor, sensory, and coordinative functions are all intact. HEENT: Normocephalic, atraumatic, pupils equal round reactive to light, neck supple, no masses, no lymphadenopathy, thyroid nonpalpable. Oropharynx, nasopharynx, external ear canals are unremarkable. Skin: Warm and dry, no rashes noted.  Cardiac: Regular rate and rhythm, no murmurs rubs or gallops.  Respiratory: Clear to auscultation bilaterally. Not using accessory muscles, speaking in full sentences.  Abdominal: Soft, nontender, nondistended, positive bowel sounds, no  masses, no organomegaly.  Musculoskeletal: Shoulder, elbow, wrist, hip, knee, ankle stable, and with full range of motion.  Hands with typical end-stage rheumatoid ulnar deviation of the digits.  Impression and Recommendations:    The patient was counselled, risk factors were discussed, anticipatory guidance given.  Annual physical exam Annual physical as above.  Current chronic use of systemic steroids Blood sugars are okay, bone densitometry was 3 years ago, ordering another one.  Lumbar spinal stenosis With a recent flare. Prednisone 50 mg daily for 5 days then drop back down to the 5 mg daily dose, Flexeril, x-rays. Rehab exercises given. Return to see me in 4 to 6 weeks if not better and we will get an MRI for epidural planning.  Hyperlipidemia, predominantly triglycerides Did have persistent hypertriglyceridemia. This improved with current medications but was still elevated. Adding niacin. Check lipids in 3 months. ___________________________________________ Gwen Her. Dianah Field, M.D., ABFM., CAQSM. Primary Care and Sports Medicine [facility]  Adjunct Professor of [place] of [facility] of Medicine

## 2018-10-25 NOTE — Assessment & Plan Note (Signed)
Blood sugars are okay, bone densitometry was 3 years ago, ordering another one.

## 2018-10-28 ENCOUNTER — Other Ambulatory Visit: Payer: Self-pay | Admitting: Sports Medicine

## 2018-10-28 DIAGNOSIS — E785 Hyperlipidemia, unspecified: Secondary | ICD-10-CM

## 2018-10-28 MED FILL — ATORVASTATIN 40 MG TABLET: 40 | 90 days supply | Qty: 90 | Fill #0

## 2018-10-30 ENCOUNTER — Ambulatory Visit (INDEPENDENT_AMBULATORY_CARE_PROVIDER_SITE_OTHER): Payer: 59

## 2018-10-30 DIAGNOSIS — Z13820 Encounter for screening for osteoporosis: Secondary | ICD-10-CM | POA: Diagnosis not present

## 2018-10-30 DIAGNOSIS — Z7952 Long term (current) use of systemic steroids: Secondary | ICD-10-CM | POA: Diagnosis not present

## 2018-11-01 DIAGNOSIS — H179 Unspecified corneal scar and opacity: Secondary | ICD-10-CM | POA: Diagnosis not present

## 2018-11-01 DIAGNOSIS — H18463 Peripheral corneal degeneration, bilateral: Secondary | ICD-10-CM | POA: Diagnosis not present

## 2018-11-01 DIAGNOSIS — H16403 Unspecified corneal neovascularization, bilateral: Secondary | ICD-10-CM | POA: Diagnosis not present

## 2018-11-01 DIAGNOSIS — M069 Rheumatoid arthritis, unspecified: Secondary | ICD-10-CM | POA: Diagnosis not present

## 2018-11-01 DIAGNOSIS — H2513 Age-related nuclear cataract, bilateral: Secondary | ICD-10-CM | POA: Diagnosis not present

## 2018-11-05 ENCOUNTER — Other Ambulatory Visit: Payer: Self-pay | Admitting: Sports Medicine

## 2018-11-05 DIAGNOSIS — E782 Mixed hyperlipidemia: Secondary | ICD-10-CM

## 2018-11-05 MED FILL — FENOFIBRATE 160 MG TABLET: 160 | 90 days supply | Qty: 90 | Fill #0

## 2018-11-05 MED FILL — ORENCIA 250 MG SOLR: 250 | 28 days supply | Qty: 3 | Fill #2

## 2018-11-13 DIAGNOSIS — M0589 Other rheumatoid arthritis with rheumatoid factor of multiple sites: Secondary | ICD-10-CM | POA: Diagnosis not present

## 2018-11-20 ENCOUNTER — Other Ambulatory Visit: Payer: Self-pay | Admitting: Sports Medicine

## 2018-11-20 MED FILL — LISINOPRIL-HCTZ 20-25 MG TA: 20-25 | 90 days supply | Qty: 90 | Fill #0

## 2018-11-21 MED FILL — DOXYCYCLINE HYCLATE 100 MG: 100 | 60 days supply | Qty: 60 | Fill #0

## 2018-11-21 MED FILL — METHOTREXATE 25 MG/ML VIAL: 50 | 28 days supply | Qty: 4 | Fill #1

## 2018-11-21 MED FILL — BD TB SYRINGE 27GX1/2": 27G X 1/2" | 28 days supply | Qty: 4 | Fill #0

## 2018-11-21 MED FILL — BD TB SYRINGE 27GX1/2: 27G X 1/2" | 28 days supply | Qty: 4 | Fill #0

## 2018-12-04 MED FILL — ORENCIA 250 MG SOLR: 250 | 28 days supply | Qty: 3 | Fill #3

## 2018-12-11 DIAGNOSIS — Z79899 Other long term (current) drug therapy: Secondary | ICD-10-CM | POA: Diagnosis not present

## 2018-12-11 DIAGNOSIS — M0589 Other rheumatoid arthritis with rheumatoid factor of multiple sites: Secondary | ICD-10-CM | POA: Diagnosis not present

## 2018-12-30 MED FILL — ORENCIA 250 MG SOLR: 250 | 28 days supply | Qty: 3 | Fill #4

## 2018-12-30 MED FILL — ESOMEPRAZOLE MAG DR 40 MG C: 40 | 90 days supply | Qty: 90 | Fill #1

## 2018-12-30 MED FILL — predniSONE 5 MG TABS: 5 | 30 days supply | Qty: 60 | Fill #2

## 2019-01-08 DIAGNOSIS — M0589 Other rheumatoid arthritis with rheumatoid factor of multiple sites: Secondary | ICD-10-CM | POA: Diagnosis not present

## 2019-01-29 MED FILL — ATORVASTATIN 40 MG TABLET: 40 | 90 days supply | Qty: 90 | Fill #1

## 2019-01-29 MED FILL — ORENCIA 250 MG SOLR: 250 | 28 days supply | Qty: 3 | Fill #5

## 2019-01-29 MED FILL — DOXYCYCLINE HYCLATE 100 MG: 100 | 60 days supply | Qty: 60 | Fill #1

## 2019-02-05 DIAGNOSIS — M0589 Other rheumatoid arthritis with rheumatoid factor of multiple sites: Secondary | ICD-10-CM | POA: Diagnosis not present

## 2019-02-07 ENCOUNTER — Other Ambulatory Visit: Payer: Self-pay | Admitting: Sports Medicine

## 2019-02-07 DIAGNOSIS — E782 Mixed hyperlipidemia: Secondary | ICD-10-CM

## 2019-02-07 MED FILL — FENOFIBRATE 160 MG TABLET: 160 | 90 days supply | Qty: 90 | Fill #0

## 2019-02-07 MED FILL — METHOTREXATE 25 MG/ML VIAL: 50 | 28 days supply | Qty: 4 | Fill #2

## 2019-02-07 MED FILL — BD TB SYRINGE 27GX1/2: 27G X 1/2" | 28 days supply | Qty: 4 | Fill #1

## 2019-02-13 DIAGNOSIS — M150 Primary generalized (osteo)arthritis: Secondary | ICD-10-CM | POA: Diagnosis not present

## 2019-02-13 DIAGNOSIS — M0589 Other rheumatoid arthritis with rheumatoid factor of multiple sites: Secondary | ICD-10-CM | POA: Diagnosis not present

## 2019-02-13 DIAGNOSIS — E669 Obesity, unspecified: Secondary | ICD-10-CM | POA: Diagnosis not present

## 2019-02-13 DIAGNOSIS — Z6828 Body mass index (BMI) 28.0-28.9, adult: Secondary | ICD-10-CM | POA: Diagnosis not present

## 2019-02-13 DIAGNOSIS — H16002 Unspecified corneal ulcer, left eye: Secondary | ICD-10-CM | POA: Diagnosis not present

## 2019-02-13 DIAGNOSIS — M3500 Sicca syndrome, unspecified: Secondary | ICD-10-CM | POA: Diagnosis not present

## 2019-02-25 MED FILL — LISINOPRIL-HCTZ 20-25 MG TA: 20-25 | 90 days supply | Qty: 90 | Fill #1

## 2019-02-25 MED FILL — predniSONE 5 MG TABS: 5 | 30 days supply | Qty: 60 | Fill #0

## 2019-02-26 MED FILL — ORENCIA 250 MG SOLR: 250 | 28 days supply | Qty: 3 | Fill #6

## 2019-03-05 DIAGNOSIS — M0589 Other rheumatoid arthritis with rheumatoid factor of multiple sites: Secondary | ICD-10-CM | POA: Diagnosis not present

## 2019-03-24 MED FILL — DOXYCYCLINE HYCLATE 100 MG: 100 | 60 days supply | Qty: 60 | Fill #2

## 2019-03-24 MED FILL — ORENCIA 250 MG SOLR: 250 | 28 days supply | Qty: 3 | Fill #7

## 2019-03-24 MED FILL — ESOMEPRAZOLE MAG DR 40 MG C: 40 | 90 days supply | Qty: 90 | Fill #2

## 2019-04-02 DIAGNOSIS — M0589 Other rheumatoid arthritis with rheumatoid factor of multiple sites: Secondary | ICD-10-CM | POA: Diagnosis not present

## 2019-04-21 DIAGNOSIS — H16403 Unspecified corneal neovascularization, bilateral: Secondary | ICD-10-CM | POA: Diagnosis not present

## 2019-04-21 DIAGNOSIS — H179 Unspecified corneal scar and opacity: Secondary | ICD-10-CM | POA: Diagnosis not present

## 2019-04-21 DIAGNOSIS — H18463 Peripheral corneal degeneration, bilateral: Secondary | ICD-10-CM | POA: Diagnosis not present

## 2019-04-21 DIAGNOSIS — H2513 Age-related nuclear cataract, bilateral: Secondary | ICD-10-CM | POA: Diagnosis not present

## 2019-04-21 DIAGNOSIS — H0015 Chalazion left lower eyelid: Secondary | ICD-10-CM | POA: Diagnosis not present

## 2019-04-21 DIAGNOSIS — M069 Rheumatoid arthritis, unspecified: Secondary | ICD-10-CM | POA: Diagnosis not present

## 2019-04-21 MED FILL — ORENCIA 250 MG SOLR: 250 | 28 days supply | Qty: 3 | Fill #8

## 2019-04-22 MED FILL — BD TB SYRINGE 27GX1/2": 27G X 1/2" | 28 days supply | Qty: 4 | Fill #2

## 2019-04-22 MED FILL — BD TB SYRINGE 27GX1/2: 27G X 1/2" | 28 days supply | Qty: 4 | Fill #2

## 2019-04-23 MED FILL — METHOTREXATE 25 MG/ML VIAL: 50 | 28 days supply | Qty: 4 | Fill #0

## 2019-04-30 DIAGNOSIS — M0589 Other rheumatoid arthritis with rheumatoid factor of multiple sites: Secondary | ICD-10-CM | POA: Diagnosis not present

## 2019-05-06 ENCOUNTER — Other Ambulatory Visit: Payer: Self-pay | Admitting: Sports Medicine

## 2019-05-06 DIAGNOSIS — E782 Mixed hyperlipidemia: Secondary | ICD-10-CM

## 2019-05-06 MED FILL — FENOFIBRATE 160 MG TABLET: 160 | 90 days supply | Qty: 90 | Fill #0

## 2019-05-06 MED FILL — predniSONE 5 MG TABS: 5 | 30 days supply | Qty: 60 | Fill #1

## 2019-05-06 MED FILL — OMEGA-3-ACID ETHYL ESTERS 1: 1 | 90 days supply | Qty: 360 | Fill #0

## 2019-05-20 MED FILL — ORENCIA 250 MG SOLR: 250 | 28 days supply | Qty: 3 | Fill #9

## 2019-05-23 DIAGNOSIS — M3500 Sicca syndrome, unspecified: Secondary | ICD-10-CM | POA: Diagnosis not present

## 2019-05-23 DIAGNOSIS — H16002 Unspecified corneal ulcer, left eye: Secondary | ICD-10-CM | POA: Diagnosis not present

## 2019-05-23 DIAGNOSIS — M150 Primary generalized (osteo)arthritis: Secondary | ICD-10-CM | POA: Diagnosis not present

## 2019-05-23 DIAGNOSIS — M0589 Other rheumatoid arthritis with rheumatoid factor of multiple sites: Secondary | ICD-10-CM | POA: Diagnosis not present

## 2019-05-23 DIAGNOSIS — E663 Overweight: Secondary | ICD-10-CM | POA: Diagnosis not present

## 2019-05-23 DIAGNOSIS — Z6829 Body mass index (BMI) 29.0-29.9, adult: Secondary | ICD-10-CM | POA: Diagnosis not present

## 2019-05-28 DIAGNOSIS — M0589 Other rheumatoid arthritis with rheumatoid factor of multiple sites: Secondary | ICD-10-CM | POA: Diagnosis not present

## 2019-05-30 MED FILL — DOXYCYCLINE HYCLATE 100 MG: 100 | 60 days supply | Qty: 60 | Fill #3

## 2019-05-30 MED FILL — LISINOPRIL-HCTZ 20-25 MG TA: 20-25 | 90 days supply | Qty: 90 | Fill #2

## 2019-06-05 MED FILL — ESOMEPRAZOLE MAG DR 40 MG C: 40 | 90 days supply | Qty: 90 | Fill #3

## 2019-06-05 MED FILL — NIACIN ER 500 MG TBCR: 500 | 100 days supply | Qty: 100 | Fill #1

## 2019-06-19 MED FILL — ORENCIA 250 MG SOLR: 250 | 28 days supply | Qty: 3 | Fill #10

## 2019-06-25 DIAGNOSIS — M0589 Other rheumatoid arthritis with rheumatoid factor of multiple sites: Secondary | ICD-10-CM | POA: Diagnosis not present

## 2019-07-10 ENCOUNTER — Other Ambulatory Visit: Payer: Self-pay | Admitting: Internal Medicine

## 2019-07-10 MED FILL — BD TB SYRINGE 27GX1/2: 27G X 1/2" | 28 days supply | Qty: 4 | Fill #3

## 2019-07-10 MED FILL — predniSONE 5 MG TABS: 5 | 30 days supply | Qty: 60 | Fill #0

## 2019-07-10 MED FILL — METHOTREXATE 25 MG/ML VIAL: 50 | 28 days supply | Qty: 4 | Fill #1

## 2019-07-16 ENCOUNTER — Other Ambulatory Visit: Payer: Self-pay | Admitting: Pharmacist

## 2019-07-16 MED ORDER — ORENCIA 250 MG IV SOLR: INTRAVENOUS | 12 refills | Status: DC

## 2019-07-17 ENCOUNTER — Other Ambulatory Visit: Payer: Self-pay | Admitting: Pharmacist

## 2019-07-17 MED ORDER — ORENCIA 250 MG IV SOLR: INTRAVENOUS | 12 refills | Status: DC

## 2019-07-17 MED FILL — ORENCIA 250 MG SOLR: 250 | 28 days supply | Qty: 3 | Fill #0

## 2019-07-23 DIAGNOSIS — M0589 Other rheumatoid arthritis with rheumatoid factor of multiple sites: Secondary | ICD-10-CM | POA: Diagnosis not present

## 2019-08-13 ENCOUNTER — Other Ambulatory Visit: Payer: Self-pay | Admitting: Sports Medicine

## 2019-08-13 DIAGNOSIS — E782 Mixed hyperlipidemia: Secondary | ICD-10-CM

## 2019-08-13 MED FILL — FENOFIBRATE 160 MG TABLET: 160 | 90 days supply | Qty: 90 | Fill #0

## 2019-08-13 MED FILL — ORENCIA 250 MG SOLR: 250 | 28 days supply | Qty: 3 | Fill #1

## 2019-08-20 DIAGNOSIS — M0589 Other rheumatoid arthritis with rheumatoid factor of multiple sites: Secondary | ICD-10-CM | POA: Diagnosis not present

## 2019-08-26 MED FILL — LISINOPRIL-HCTZ 20-25 MG TA: 20-25 | 90 days supply | Qty: 90 | Fill #3

## 2019-08-26 MED FILL — ESOMEPRAZOLE MAG DR 40 MG C: 40 | 15 days supply | Qty: 15 | Fill #0

## 2019-08-29 MED FILL — DOXYCYCLINE MONO 50 MG TAB: 50 | 30 days supply | Qty: 60 | Fill #0

## 2019-09-02 MED FILL — ESOMEPRAZOLE MAG DR 40 MG C: 40 | 90 days supply | Qty: 90 | Fill #1

## 2019-09-03 MED FILL — ORENCIA 250 MG SOLR: 250 | 28 days supply | Qty: 3 | Fill #2

## 2019-09-10 ENCOUNTER — Ambulatory Visit (HOSPITAL_BASED_OUTPATIENT_CLINIC_OR_DEPARTMENT_OTHER): Payer: 59 | Admitting: Pharmacist

## 2019-09-10 ENCOUNTER — Other Ambulatory Visit: Payer: Self-pay

## 2019-09-10 DIAGNOSIS — Z79899 Other long term (current) drug therapy: Secondary | ICD-10-CM

## 2019-09-10 NOTE — Progress Notes (Signed)
Virtual Visit via Telephone Note  I connected withGary Valdez, on12/2/2020at1:30 PMby telephonedue to the COVID-19 pandemic and verified that I am speaking with the correct person using two identifiers.  Consent: I discussed the limitations, risks, security and privacy concerns of performing an evaluation and management service by telephone and the availability of in person appointments. I also discussed with the patient that there may be a patient responsible charge related to this service. The patient expressed understanding and agreed to proceed.  Location of Patient: Work  Biomedical scientist of Provider: Clinic  Persons participating in Telemedicine visit: Patient Myself  S: Patient presents to Patient [facility] for review of their specialty medication therapy.  Patient is currently taking Orencia for rheumatoid arthritis. Patient is managed by Dr. Amil Amen for this.   Adherence: denies any missed doses.   Efficacy: reports that it is still working very well for him  FDA-approved dosing: 750 mg every 4 weeks (gets infusion at [facility]).  Drug-drug interactions: none  Screenings: TB screening: completed per patient Hepatitis Screening: completed per patient Blood glucose: Orencia contains maltose which make falsely elevate glucose levels  Monitoring: S/sx of infection: denies.  S/sx of hypersensitivity: denies  Other adverse effects: denies  O:  Lab Results  Component Value Date   WBC 7.9 05/22/2018   HGB 15.6 05/22/2018   HCT 46.0 05/22/2018   MCV 93.9 05/22/2018   PLT 270 05/22/2018      Chemistry      Component Value Date/Time   NA 140 05/22/2018 0612   K 4.7 05/22/2018 0612   CL 104 05/22/2018 0612   CO2 26 05/22/2018 0612   BUN 19 05/22/2018 0612   CREATININE 1.16 05/22/2018 0612      Component Value Date/Time   CALCIUM 10.3 05/22/2018 0612   ALKPHOS 78 09/25/2016 0530   AST 15 05/22/2018 0612   ALT 13 05/22/2018 0612   BILITOT 0.5 05/22/2018 0612       A/P: 1. Medication review: Patient currently on [place] for the treatment of rheumatoid arthritis and is tolerating it well with continued improved control of RA. Reviewed the medication with the patient, including the following: Orencia is a selective T-cell costimulation blocker indicated for rheumatoid arthritis. The most common adverse effects are infections, headache, and injection site reactions. There is a possible adverse effect of increased risk of malignancy but it is not fully understood if this is due to the drug or the disease state itself. The patient was instructed to avoid use of live vaccinations without the approval of a physician. No recommendations for any changes.  Benard Halsted, PharmD, [place] [phone]

## 2019-09-17 DIAGNOSIS — M0589 Other rheumatoid arthritis with rheumatoid factor of multiple sites: Secondary | ICD-10-CM | POA: Diagnosis not present

## 2019-09-17 MED FILL — BD TB SYRINGE 27GX1/2": 27G X 1/2" | 28 days supply | Qty: 4 | Fill #4

## 2019-09-17 MED FILL — BD TB SYRINGE 27GX1/2: 27G X 1/2" | 28 days supply | Qty: 4 | Fill #4

## 2019-09-17 MED FILL — predniSONE 5 MG TABS: 5 | 30 days supply | Qty: 60 | Fill #0

## 2019-09-17 MED FILL — METHOTREXATE 25 MG/ML VIAL: 50 | 28 days supply | Qty: 4 | Fill #0

## 2019-09-26 DIAGNOSIS — H16002 Unspecified corneal ulcer, left eye: Secondary | ICD-10-CM | POA: Diagnosis not present

## 2019-09-26 DIAGNOSIS — Z6829 Body mass index (BMI) 29.0-29.9, adult: Secondary | ICD-10-CM | POA: Diagnosis not present

## 2019-09-26 DIAGNOSIS — E663 Overweight: Secondary | ICD-10-CM | POA: Diagnosis not present

## 2019-09-26 DIAGNOSIS — M3500 Sicca syndrome, unspecified: Secondary | ICD-10-CM | POA: Diagnosis not present

## 2019-09-26 DIAGNOSIS — M0589 Other rheumatoid arthritis with rheumatoid factor of multiple sites: Secondary | ICD-10-CM | POA: Diagnosis not present

## 2019-09-26 DIAGNOSIS — M150 Primary generalized (osteo)arthritis: Secondary | ICD-10-CM | POA: Diagnosis not present

## 2019-10-13 DIAGNOSIS — M069 Rheumatoid arthritis, unspecified: Secondary | ICD-10-CM | POA: Diagnosis not present

## 2019-10-13 DIAGNOSIS — H16403 Unspecified corneal neovascularization, bilateral: Secondary | ICD-10-CM | POA: Diagnosis not present

## 2019-10-13 DIAGNOSIS — H18463 Peripheral corneal degeneration, bilateral: Secondary | ICD-10-CM | POA: Diagnosis not present

## 2019-10-13 DIAGNOSIS — H2513 Age-related nuclear cataract, bilateral: Secondary | ICD-10-CM | POA: Diagnosis not present

## 2019-10-13 DIAGNOSIS — H179 Unspecified corneal scar and opacity: Secondary | ICD-10-CM | POA: Diagnosis not present

## 2019-10-13 MED FILL — ORENCIA 250 MG SOLR: 250 | 28 days supply | Qty: 3 | Fill #0

## 2019-10-16 DIAGNOSIS — M150 Primary generalized (osteo)arthritis: Secondary | ICD-10-CM | POA: Diagnosis not present

## 2019-10-16 DIAGNOSIS — Z79899 Other long term (current) drug therapy: Secondary | ICD-10-CM | POA: Diagnosis not present

## 2019-10-16 DIAGNOSIS — M0589 Other rheumatoid arthritis with rheumatoid factor of multiple sites: Secondary | ICD-10-CM | POA: Diagnosis not present

## 2019-11-07 ENCOUNTER — Ambulatory Visit (INDEPENDENT_AMBULATORY_CARE_PROVIDER_SITE_OTHER): Payer: 59 | Admitting: Sports Medicine

## 2019-11-07 ENCOUNTER — Other Ambulatory Visit: Payer: Self-pay

## 2019-11-07 ENCOUNTER — Encounter: Payer: Self-pay | Admitting: Sports Medicine

## 2019-11-07 VITALS — BP 100/68 | HR 81 | Ht 73.0 in | Wt 212.0 lb

## 2019-11-07 DIAGNOSIS — Z0000 Encounter for general adult medical examination without abnormal findings: Secondary | ICD-10-CM | POA: Diagnosis not present

## 2019-11-07 DIAGNOSIS — Z23 Encounter for immunization: Secondary | ICD-10-CM | POA: Diagnosis not present

## 2019-11-07 DIAGNOSIS — E782 Mixed hyperlipidemia: Secondary | ICD-10-CM | POA: Diagnosis not present

## 2019-11-07 DIAGNOSIS — Z7952 Long term (current) use of systemic steroids: Secondary | ICD-10-CM

## 2019-11-07 NOTE — Assessment & Plan Note (Addendum)
We did Todd Valdez's annual physical today. He is immunocompromised due to his medications for rheumatoid arthritis so he is going to get pneumococcal 13 today and pneumococcal 23 in 8 weeks. Checking PSA every 2 years.

## 2019-11-07 NOTE — Assessment & Plan Note (Addendum)
Checking routine labs.  Lipids continue to be uncontrolled, this is in spite of max tolerable statin. Continue niacin, continue fish oil, continue fenofibrate, adding Repatha, discontinue atorvastatin.

## 2019-11-07 NOTE — Progress Notes (Addendum)
Subjective:    CC: Annual Physical Exam  HPI:  This patient is here for their annual physical  I reviewed the past medical history, family history, social history, surgical history, and allergies today and no changes were needed.  Please see the problem list section below in epic for further details.  Past Medical History: Past Medical History:  Diagnosis Date  . Arthritis   . Cancer ([place])    Melanoma on  back  . Diverticulitis   . GERD (gastroesophageal reflux disease)   . High triglycerides   . History of abscess of skin and subcutaneous tissue    right forearm, chin  . Hypertension   . RA (rheumatoid arthritis) ([place])   . Reflux esophagitis   . Right inguinal hernia    Past Surgical History: Past Surgical History:  Procedure Laterality Date  . bilateral feet reconstruction  10/2000  . COLONOSCOPY    . feet surgery    . INGUINAL HERNIA REPAIR Right 11/27/2017   Procedure: OPEN RIGHT INGUINAL HERNIA REPAIR;  Surgeon: Todd Gemma, MD;  Location: [facility];  Service: General;  Laterality: Right;  . INSERTION OF MESH Right 11/27/2017   Procedure: INSERTION OF MESH;  Surgeon: Todd Gemma, MD;  Location: [facility];  Service: General;  Laterality: Right;  . MELANOMA EXCISION    . UPPER GI ENDOSCOPY  12/2011   Social History: Social History   Socioeconomic History  . Marital status: Married    Spouse name: Not on file  . Number of children: Not on file  . Years of education: Not on file  . Highest education level: Not on file  Occupational History  . Not on file  Tobacco Use  . Smoking status: Former Smoker    Quit date: 02/07/1991    Years since quitting: 28.7  . Smokeless tobacco: Former Systems developer    Quit date: 08/14/2009  Substance and Sexual Activity  . Alcohol use: No  . Drug use: No  . Sexual activity: Yes    Partners: Female    Comment: married.   Other Topics Concern  . Not on file  Social History Narrative  . Not on file    Social Determinants of Health   Financial Resource Strain:   . Difficulty of Paying Living Expenses: Not on file  Food Insecurity:   . Worried About Charity fundraiser in the Last Year: Not on file  . Ran Out of Food in the Last Year: Not on file  Transportation Needs:   . Lack of Transportation (Medical): Not on file  . Lack of Transportation (Non-Medical): Not on file  Physical Activity:   . Days of Exercise per Week: Not on file  . Minutes of Exercise per Session: Not on file  Stress:   . Feeling of Stress : Not on file  Social Connections:   . Frequency of Communication with Friends and Family: Not on file  . Frequency of Social Gatherings with Friends and Family: Not on file  . Attends Religious Services: Not on file  . Active Member of Clubs or Organizations: Not on file  . Attends Archivist Meetings: Not on file  . Marital Status: Not on file   Family History: Family History  Problem Relation Age of Onset  . Hypertension Mother   . Hypertension Father    Allergies: No Known Allergies Medications: See med rec.  Review of Systems: No headache, visual changes, nausea, vomiting, diarrhea, constipation, dizziness, abdominal pain,  skin rash, fevers, chills, night sweats, swollen lymph nodes, weight loss, chest pain, body aches, joint swelling, muscle aches, shortness of breath, mood changes, visual or auditory hallucinations.  Objective:    General: Well Developed, well nourished, and in no acute distress.  Neuro: Alert and oriented x3, extra-ocular muscles intact, sensation grossly intact. Cranial nerves II through XII are intact, motor, sensory, and coordinative functions are all intact. HEENT: Normocephalic, atraumatic, pupils equal round reactive to light, neck supple, no masses, no lymphadenopathy, thyroid nonpalpable. Oropharynx, nasopharynx, external ear canals are unremarkable. Skin: Warm and dry, no rashes noted.  Cardiac: Regular rate and rhythm, no  murmurs rubs or gallops.  Respiratory: Clear to auscultation bilaterally. Not using accessory muscles, speaking in full sentences.  Abdominal: Soft, nontender, nondistended, positive bowel sounds, no masses, no organomegaly.  Musculoskeletal: Shoulder, elbow, wrist, hip, knee, ankle stable, and with full range of motion.  Impression and Recommendations:    The patient was counselled, risk factors were discussed, anticipatory guidance given.  Annual physical exam We did Todd Valdez's annual physical today. He is immunocompromised due to his medications for rheumatoid arthritis so he is going to get pneumococcal 13 today and pneumococcal 23 in 8 weeks. Checking PSA every 2 years.  Hyperlipidemia, predominantly triglycerides Checking routine labs.  Lipids continue to be uncontrolled, this is in spite of max tolerable statin. Continue niacin, continue fish oil, continue fenofibrate, adding Repatha, discontinue atorvastatin.   Current chronic use of systemic steroids Chronic steroids for rheumatoid arthritis, checking A1c. Bone density was normal last year, we can repeat it in 2022.   ___________________________________________ Gwen Her. Dianah Field, M.D., ABFM., CAQSM. Primary Care and Sports Medicine [facility]  Adjunct Professor of [place] of [facility] of Medicine

## 2019-11-07 NOTE — Assessment & Plan Note (Signed)
Chronic steroids for rheumatoid arthritis, checking A1c. Bone density was normal last year, we can repeat it in 2022.

## 2019-11-07 NOTE — Addendum Note (Signed)
Addended by: Beatris Ship L on: 11/07/2019 09:02 AM   Modules accepted: Orders

## 2019-11-10 LAB — COMPLETE METABOLIC PANEL WITH GFR
AG Ratio: 1.8 (calc) (ref 1.0–2.5)
ALT: 18 U/L (ref 9–46)
AST: 14 U/L (ref 10–35)
Albumin: 4.4 g/dL (ref 3.6–5.1)
Alkaline phosphatase (APISO): 66 U/L (ref 35–144)
BUN: 24 mg/dL (ref 7–25)
CO2: 21 mmol/L (ref 20–32)
Calcium: 10.2 mg/dL (ref 8.6–10.3)
Chloride: 104 mmol/L (ref 98–110)
Creat: 1.06 mg/dL (ref 0.70–1.25)
GFR, Est African American: 88 mL/min/{1.73_m2} (ref >=60)
GFR, Est Non African American: 76 mL/min/{1.73_m2} (ref >=60)
Globulin: 2.5 g/dL (calc) (ref 1.9–3.7)
Glucose, Bld: 114 mg/dL — ABNORMAL HIGH (ref 65–99)
Potassium: 4.5 mmol/L (ref 3.5–5.3)
Sodium: 137 mmol/L (ref 135–146)
Total Bilirubin: 0.8 mg/dL (ref 0.2–1.2)
Total Protein: 6.9 g/dL (ref 6.1–8.1)

## 2019-11-10 LAB — PSA, TOTAL AND FREE
PSA, % Free: 50 % (calc) (ref >25)
PSA, Free: 0.6 ng/mL
PSA, Total: 1.2 ng/mL (ref <=4.0)

## 2019-11-10 LAB — CBC
HCT: 43 % (ref 38.5–50.0)
Hemoglobin: 14.7 g/dL (ref 13.2–17.1)
MCH: 31.5 pg (ref 27.0–33.0)
MCHC: 34.2 g/dL (ref 32.0–36.0)
MCV: 92.1 fL (ref 80.0–100.0)
MPV: 10.8 fL (ref 7.5–12.5)
Platelets: 249 10*3/uL (ref 140–400)
RBC: 4.67 10*6/uL (ref 4.20–5.80)
RDW: 12.7 % (ref 11.0–15.0)
WBC: 9.4 10*3/uL (ref 3.8–10.8)

## 2019-11-10 LAB — LIPID PANEL W/REFLEX DIRECT LDL
Cholesterol: 242 mg/dL — ABNORMAL HIGH (ref <200)
HDL: 31 mg/dL — ABNORMAL LOW (ref >=40)
LDL Cholesterol (Calc): 160 mg/dL (calc) — ABNORMAL HIGH
Non-HDL Cholesterol (Calc): 211 mg/dL (calc) — ABNORMAL HIGH (ref <130)
Total CHOL/HDL Ratio: 7.8 (calc) — ABNORMAL HIGH (ref <5.0)
Triglycerides: 332 mg/dL — ABNORMAL HIGH (ref <150)

## 2019-11-10 LAB — HEMOGLOBIN A1C
Hgb A1c MFr Bld: 5.3 % of total Hgb (ref <5.7)
Mean Plasma Glucose: 105 (calc)
eAG (mmol/L): 5.8 (calc)

## 2019-11-10 LAB — TSH: TSH: 2.05 mIU/L (ref 0.40–4.50)

## 2019-11-10 MED ORDER — REPATHA SURECLICK 140 MG/ML ~~LOC~~ SOAJ: 140.0000 mg | SUBCUTANEOUS | 11 refills | Status: DC

## 2019-11-10 MED FILL — ORENCIA 250 MG SOLR: 250 | 28 days supply | Qty: 3 | Fill #1

## 2019-11-10 NOTE — Addendum Note (Signed)
Addended by: Silverio Decamp on: 11/10/2019 02:41 PM   Modules accepted: Orders

## 2019-11-12 ENCOUNTER — Other Ambulatory Visit: Payer: Self-pay | Admitting: Sports Medicine

## 2019-11-12 DIAGNOSIS — E782 Mixed hyperlipidemia: Secondary | ICD-10-CM

## 2019-11-12 MED FILL — DOXYCYCLINE MONO 50 MG TAB: 50 | 60 days supply | Qty: 60 | Fill #1

## 2019-11-12 MED FILL — predniSONE 5 MG TABS: 5 | 30 days supply | Qty: 30 | Fill #0

## 2019-11-12 MED FILL — FENOFIBRATE 160 MG TABLET: 160 | 90 days supply | Qty: 90 | Fill #0

## 2019-11-13 DIAGNOSIS — M0589 Other rheumatoid arthritis with rheumatoid factor of multiple sites: Secondary | ICD-10-CM | POA: Diagnosis not present

## 2019-11-17 ENCOUNTER — Telehealth: Payer: Self-pay | Admitting: Sports Medicine

## 2019-11-17 ENCOUNTER — Other Ambulatory Visit: Payer: Self-pay | Admitting: Sports Medicine

## 2019-11-17 DIAGNOSIS — E785 Hyperlipidemia, unspecified: Secondary | ICD-10-CM

## 2019-11-17 MED FILL — BD TB SYRINGE 27GX1/2": 27G X 1/2" | 28 days supply | Qty: 4 | Fill #5

## 2019-11-17 MED FILL — REPATHA SURECLICK 140 MG/ML: 140 | 28 days supply | Qty: 2 | Fill #0

## 2019-11-17 MED FILL — METHOTREXATE 25 MG/ML VIAL: 50 | 28 days supply | Qty: 4 | Fill #0

## 2019-11-17 MED FILL — ATORVASTATIN 40 MG TABLET: 40 | 90 days supply | Qty: 90 | Fill #0

## 2019-11-17 MED FILL — BD TB SYRINGE 27GX1/2: 27G X 1/2" | 28 days supply | Qty: 4 | Fill #5

## 2019-11-17 NOTE — Telephone Encounter (Signed)
Received a fax that Repatha is approved from 11/14/2019 through 11/12/2020. Pharmacy aware and form sent to scan.

## 2019-11-24 ENCOUNTER — Other Ambulatory Visit: Payer: Self-pay | Admitting: Sports Medicine

## 2019-11-24 MED FILL — LISINOPRIL-HCTZ 20-25 MG TA: 20-25 | 90 days supply | Qty: 90 | Fill #0

## 2019-11-24 NOTE — Telephone Encounter (Signed)
Needs appointment

## 2019-11-28 MED FILL — ESOMEPRAZOLE MAG DR 40 MG C: 40 | 90 days supply | Qty: 90 | Fill #2

## 2019-12-08 MED FILL — ORENCIA 250 MG SOLR: 250 | 28 days supply | Qty: 3 | Fill #2

## 2019-12-11 DIAGNOSIS — M0589 Other rheumatoid arthritis with rheumatoid factor of multiple sites: Secondary | ICD-10-CM | POA: Diagnosis not present

## 2019-12-19 DIAGNOSIS — Z23 Encounter for immunization: Secondary | ICD-10-CM | POA: Diagnosis not present

## 2019-12-29 MED FILL — predniSONE 5 MG TABS: 5 | 30 days supply | Qty: 30 | Fill #0

## 2020-01-02 ENCOUNTER — Ambulatory Visit: Payer: 59

## 2020-01-05 MED FILL — ORENCIA 250 MG SOLR: 250 | 28 days supply | Qty: 3 | Fill #3

## 2020-01-08 DIAGNOSIS — M0589 Other rheumatoid arthritis with rheumatoid factor of multiple sites: Secondary | ICD-10-CM | POA: Diagnosis not present

## 2020-01-14 DIAGNOSIS — Z23 Encounter for immunization: Secondary | ICD-10-CM | POA: Diagnosis not present

## 2020-01-26 DIAGNOSIS — M150 Primary generalized (osteo)arthritis: Secondary | ICD-10-CM | POA: Diagnosis not present

## 2020-01-26 DIAGNOSIS — E663 Overweight: Secondary | ICD-10-CM | POA: Diagnosis not present

## 2020-01-26 DIAGNOSIS — M0589 Other rheumatoid arthritis with rheumatoid factor of multiple sites: Secondary | ICD-10-CM | POA: Diagnosis not present

## 2020-01-26 DIAGNOSIS — H16002 Unspecified corneal ulcer, left eye: Secondary | ICD-10-CM | POA: Diagnosis not present

## 2020-01-26 DIAGNOSIS — Z6829 Body mass index (BMI) 29.0-29.9, adult: Secondary | ICD-10-CM | POA: Diagnosis not present

## 2020-01-26 DIAGNOSIS — M3500 Sicca syndrome, unspecified: Secondary | ICD-10-CM | POA: Diagnosis not present

## 2020-01-27 MED FILL — DOXYCYCLINE MONO 50 MG TAB: 50 | 60 days supply | Qty: 60 | Fill #2

## 2020-01-28 ENCOUNTER — Ambulatory Visit: Payer: 59

## 2020-01-28 ENCOUNTER — Other Ambulatory Visit (HOSPITAL_BASED_OUTPATIENT_CLINIC_OR_DEPARTMENT_OTHER): Payer: Self-pay | Admitting: Internal Medicine

## 2020-01-28 MED FILL — BD TB SYRINGE 27GX1/2: 27G X 1/2" | 28 days supply | Qty: 4 | Fill #0

## 2020-01-28 MED FILL — METHOTREXATE 25 MG/ML VIAL: 50 | 28 days supply | Qty: 4 | Fill #0

## 2020-01-30 ENCOUNTER — Other Ambulatory Visit: Payer: Self-pay

## 2020-01-30 ENCOUNTER — Ambulatory Visit (INDEPENDENT_AMBULATORY_CARE_PROVIDER_SITE_OTHER): Payer: 59 | Admitting: Sports Medicine

## 2020-01-30 VITALS — BP 113/71 | HR 75

## 2020-01-30 DIAGNOSIS — Z23 Encounter for immunization: Secondary | ICD-10-CM | POA: Diagnosis not present

## 2020-01-30 NOTE — Progress Notes (Signed)
Patient is here for Pneumococcal 23 vaccine. Denies any issues with PNA 13 vaccine. Pneumococcal vaccine to right deltoid with no apparent complications. Patient advised to call with any problems and keep future follow up appointments.

## 2020-02-04 MED FILL — predniSONE 5 MG TABS: 5 | 30 days supply | Qty: 30 | Fill #0

## 2020-02-15 MED FILL — ORENCIA 250 MG SOLR: 250 | 28 days supply | Qty: 3 | Fill #4

## 2020-02-16 ENCOUNTER — Other Ambulatory Visit: Payer: Self-pay | Admitting: Sports Medicine

## 2020-02-16 DIAGNOSIS — E782 Mixed hyperlipidemia: Secondary | ICD-10-CM

## 2020-02-18 DIAGNOSIS — M0589 Other rheumatoid arthritis with rheumatoid factor of multiple sites: Secondary | ICD-10-CM | POA: Diagnosis not present

## 2020-02-23 MED FILL — ESOMEPRAZOLE MAG DR 40 MG C: 40 | 90 days supply | Qty: 90 | Fill #3

## 2020-03-04 MED FILL — METHOTREXATE 25 MG/ML VIAL: 50 | 28 days supply | Qty: 4 | Fill #0

## 2020-03-04 MED FILL — BD TB SYRINGE 27GX1/2: 27G X 1/2" | 28 days supply | Qty: 4 | Fill #1

## 2020-03-04 MED FILL — predniSONE 5 MG TABS: 5 | 30 days supply | Qty: 30 | Fill #0

## 2020-03-11 MED FILL — ORENCIA 250 MG SOLR: 250 | 28 days supply | Qty: 3 | Fill #5

## 2020-03-17 DIAGNOSIS — M0589 Other rheumatoid arthritis with rheumatoid factor of multiple sites: Secondary | ICD-10-CM | POA: Diagnosis not present

## 2020-04-06 MED FILL — DOXYCYCLINE MONO 50 MG TAB: 50 | 60 days supply | Qty: 60 | Fill #3

## 2020-04-06 MED FILL — predniSONE 5 MG TABS: 5 | 30 days supply | Qty: 30 | Fill #0

## 2020-04-09 DIAGNOSIS — H18463 Peripheral corneal degeneration, bilateral: Secondary | ICD-10-CM | POA: Diagnosis not present

## 2020-04-09 DIAGNOSIS — M069 Rheumatoid arthritis, unspecified: Secondary | ICD-10-CM | POA: Diagnosis not present

## 2020-04-09 DIAGNOSIS — H179 Unspecified corneal scar and opacity: Secondary | ICD-10-CM | POA: Diagnosis not present

## 2020-04-09 DIAGNOSIS — H16403 Unspecified corneal neovascularization, bilateral: Secondary | ICD-10-CM | POA: Diagnosis not present

## 2020-04-09 DIAGNOSIS — H2513 Age-related nuclear cataract, bilateral: Secondary | ICD-10-CM | POA: Diagnosis not present

## 2020-04-16 DIAGNOSIS — H16403 Unspecified corneal neovascularization, bilateral: Secondary | ICD-10-CM | POA: Diagnosis not present

## 2020-04-16 DIAGNOSIS — M069 Rheumatoid arthritis, unspecified: Secondary | ICD-10-CM | POA: Diagnosis not present

## 2020-04-16 DIAGNOSIS — H2513 Age-related nuclear cataract, bilateral: Secondary | ICD-10-CM | POA: Diagnosis not present

## 2020-04-16 DIAGNOSIS — H0015 Chalazion left lower eyelid: Secondary | ICD-10-CM | POA: Diagnosis not present

## 2020-04-16 DIAGNOSIS — H179 Unspecified corneal scar and opacity: Secondary | ICD-10-CM | POA: Diagnosis not present

## 2020-04-16 DIAGNOSIS — H18463 Peripheral corneal degeneration, bilateral: Secondary | ICD-10-CM | POA: Diagnosis not present

## 2020-04-19 MED FILL — ORENCIA 250 MG SOLR: 250 | 28 days supply | Qty: 3 | Fill #6

## 2020-04-21 DIAGNOSIS — M0589 Other rheumatoid arthritis with rheumatoid factor of multiple sites: Secondary | ICD-10-CM | POA: Diagnosis not present

## 2020-04-21 DIAGNOSIS — Z79899 Other long term (current) drug therapy: Secondary | ICD-10-CM | POA: Diagnosis not present

## 2020-04-23 DIAGNOSIS — H18463 Peripheral corneal degeneration, bilateral: Secondary | ICD-10-CM | POA: Diagnosis not present

## 2020-04-23 DIAGNOSIS — M069 Rheumatoid arthritis, unspecified: Secondary | ICD-10-CM | POA: Diagnosis not present

## 2020-04-23 DIAGNOSIS — H179 Unspecified corneal scar and opacity: Secondary | ICD-10-CM | POA: Diagnosis not present

## 2020-05-07 DIAGNOSIS — M069 Rheumatoid arthritis, unspecified: Secondary | ICD-10-CM | POA: Diagnosis not present

## 2020-05-07 DIAGNOSIS — H16403 Unspecified corneal neovascularization, bilateral: Secondary | ICD-10-CM | POA: Diagnosis not present

## 2020-05-07 DIAGNOSIS — H179 Unspecified corneal scar and opacity: Secondary | ICD-10-CM | POA: Diagnosis not present

## 2020-05-07 DIAGNOSIS — H18463 Peripheral corneal degeneration, bilateral: Secondary | ICD-10-CM | POA: Diagnosis not present

## 2020-05-07 DIAGNOSIS — H2513 Age-related nuclear cataract, bilateral: Secondary | ICD-10-CM | POA: Diagnosis not present

## 2020-05-07 DIAGNOSIS — H0015 Chalazion left lower eyelid: Secondary | ICD-10-CM | POA: Diagnosis not present

## 2020-05-17 ENCOUNTER — Other Ambulatory Visit: Payer: Self-pay | Admitting: Sports Medicine

## 2020-05-17 DIAGNOSIS — E782 Mixed hyperlipidemia: Secondary | ICD-10-CM

## 2020-05-17 MED FILL — ORENCIA 250 MG SOLR: 250 | 28 days supply | Qty: 3 | Fill #7

## 2020-05-17 MED FILL — FENOFIBRATE 160 MG TABLET: 160 | 90 days supply | Qty: 90 | Fill #0

## 2020-05-17 MED FILL — ESOMEPRAZOLE MAG DR 40 MG C: 40 | 75 days supply | Qty: 75 | Fill #4

## 2020-05-17 MED FILL — BD TB SYRINGE 27GX1/2: 27G X 1/2" | 28 days supply | Qty: 4 | Fill #2

## 2020-05-17 MED FILL — LISINOPRIL-HYDROCHLOROTHIAZ: 20-25 | 90 days supply | Qty: 90 | Fill #0

## 2020-05-18 MED FILL — predniSONE 5 MG TABS: 5 | 30 days supply | Qty: 30 | Fill #0

## 2020-05-18 MED FILL — METHOTREXATE 25 MG/ML VIAL: 50 | 28 days supply | Qty: 4 | Fill #0

## 2020-05-19 DIAGNOSIS — M0589 Other rheumatoid arthritis with rheumatoid factor of multiple sites: Secondary | ICD-10-CM | POA: Diagnosis not present

## 2020-05-27 DIAGNOSIS — M150 Primary generalized (osteo)arthritis: Secondary | ICD-10-CM | POA: Diagnosis not present

## 2020-05-27 DIAGNOSIS — M0589 Other rheumatoid arthritis with rheumatoid factor of multiple sites: Secondary | ICD-10-CM | POA: Diagnosis not present

## 2020-05-27 DIAGNOSIS — H16003 Unspecified corneal ulcer, bilateral: Secondary | ICD-10-CM | POA: Diagnosis not present

## 2020-05-27 DIAGNOSIS — E663 Overweight: Secondary | ICD-10-CM | POA: Diagnosis not present

## 2020-05-27 DIAGNOSIS — Z6828 Body mass index (BMI) 28.0-28.9, adult: Secondary | ICD-10-CM | POA: Diagnosis not present

## 2020-05-27 DIAGNOSIS — H16002 Unspecified corneal ulcer, left eye: Secondary | ICD-10-CM | POA: Diagnosis not present

## 2020-05-27 DIAGNOSIS — M3500 Sicca syndrome, unspecified: Secondary | ICD-10-CM | POA: Diagnosis not present

## 2020-05-28 DIAGNOSIS — H179 Unspecified corneal scar and opacity: Secondary | ICD-10-CM | POA: Diagnosis not present

## 2020-05-28 DIAGNOSIS — H16403 Unspecified corneal neovascularization, bilateral: Secondary | ICD-10-CM | POA: Diagnosis not present

## 2020-05-28 DIAGNOSIS — H18463 Peripheral corneal degeneration, bilateral: Secondary | ICD-10-CM | POA: Diagnosis not present

## 2020-05-28 DIAGNOSIS — M069 Rheumatoid arthritis, unspecified: Secondary | ICD-10-CM | POA: Diagnosis not present

## 2020-06-07 ENCOUNTER — Other Ambulatory Visit: Payer: Self-pay | Admitting: Sports Medicine

## 2020-06-07 DIAGNOSIS — E782 Mixed hyperlipidemia: Secondary | ICD-10-CM

## 2020-06-08 MED FILL — NIACIN ER 500 MG TBCR: 500 | 100 days supply | Qty: 100 | Fill #0

## 2020-06-09 MED FILL — ORENCIA 250 MG SOLR: 250 | 28 days supply | Qty: 3 | Fill #8

## 2020-06-10 MED FILL — ATORVASTATIN CALCIUM 40 MG: 40 | 90 days supply | Qty: 90 | Fill #2

## 2020-06-11 DIAGNOSIS — H18463 Peripheral corneal degeneration, bilateral: Secondary | ICD-10-CM | POA: Diagnosis not present

## 2020-06-11 DIAGNOSIS — H16403 Unspecified corneal neovascularization, bilateral: Secondary | ICD-10-CM | POA: Diagnosis not present

## 2020-06-11 DIAGNOSIS — H179 Unspecified corneal scar and opacity: Secondary | ICD-10-CM | POA: Diagnosis not present

## 2020-06-11 DIAGNOSIS — M069 Rheumatoid arthritis, unspecified: Secondary | ICD-10-CM | POA: Diagnosis not present

## 2020-06-16 DIAGNOSIS — M0589 Other rheumatoid arthritis with rheumatoid factor of multiple sites: Secondary | ICD-10-CM | POA: Diagnosis not present

## 2020-06-16 MED FILL — DOXYCYCLINE MONO 50 MG TAB: 50 | 60 days supply | Qty: 60 | Fill #0

## 2020-06-18 ENCOUNTER — Other Ambulatory Visit (HOSPITAL_BASED_OUTPATIENT_CLINIC_OR_DEPARTMENT_OTHER): Payer: Self-pay | Admitting: Ophthalmology

## 2020-07-06 MED FILL — predniSONE 5 MG TABS: 5 | 30 days supply | Qty: 30 | Fill #0

## 2020-07-07 MED FILL — ORENCIA 250 MG SOLR: 250 | 28 days supply | Qty: 3 | Fill #9

## 2020-07-12 ENCOUNTER — Other Ambulatory Visit (HOSPITAL_BASED_OUTPATIENT_CLINIC_OR_DEPARTMENT_OTHER): Payer: Self-pay | Admitting: Internal Medicine

## 2020-07-12 MED FILL — BD TB SYRINGE 27GX1/2: 27G X 1/2" | 28 days supply | Qty: 4 | Fill #3

## 2020-07-12 MED FILL — METHOTREXATE 25 MG/ML VIAL: 50 | 28 days supply | Qty: 4 | Fill #0

## 2020-07-14 DIAGNOSIS — M0589 Other rheumatoid arthritis with rheumatoid factor of multiple sites: Secondary | ICD-10-CM | POA: Diagnosis not present

## 2020-07-27 ENCOUNTER — Other Ambulatory Visit: Payer: Self-pay | Admitting: Sports Medicine

## 2020-07-27 MED FILL — ESOMEPRAZOLE MAG DR 40 MG C: 40 | 75 days supply | Qty: 75 | Fill #0

## 2020-08-09 ENCOUNTER — Other Ambulatory Visit: Payer: Self-pay | Admitting: Pharmacist

## 2020-08-09 ENCOUNTER — Other Ambulatory Visit: Payer: Self-pay | Admitting: Internal Medicine

## 2020-08-09 MED ORDER — ORENCIA 250 MG IV SOLR: INTRAVENOUS | 2 refills | Status: DC

## 2020-08-09 NOTE — Telephone Encounter (Signed)
Requested medication (s) are due for refill today- expired Rx- yes  Requested medication (s) are on the active medication list -yes  Future visit scheduled -no  Last refill: 07/07/20  Notes to clinic: Request for RF on expired Rx  Requested Prescriptions  Pending Prescriptions Disp Refills   ORENCIA 250 MG injection [Pharmacy Med Name: ORENCIA 250 MG SOLR 250 Solution] 3 each 9    Sig: Take to MD office to Inject 750 mg (3 vials) intravenously every 4 weeks as directed      There is no refill protocol information for this order        Requested Prescriptions  Pending Prescriptions Disp Refills   ORENCIA 250 MG injection [Pharmacy Med Name: ORENCIA 250 MG SOLR 250 Solution] 3 each 9    Sig: Take to MD office to Inject 750 mg (3 vials) intravenously every 4 weeks as directed      There is no refill protocol information for this order

## 2020-08-10 ENCOUNTER — Other Ambulatory Visit: Payer: Self-pay | Admitting: Sports Medicine

## 2020-08-10 DIAGNOSIS — E782 Mixed hyperlipidemia: Secondary | ICD-10-CM

## 2020-08-10 MED FILL — FENOFIBRATE 160 MG TABLET: 160 | 90 days supply | Qty: 90 | Fill #0

## 2020-08-10 MED FILL — predniSONE 5 MG TABS: 5 | 30 days supply | Qty: 30 | Fill #0

## 2020-08-10 MED FILL — ORENCIA 250 MG SOLR: 250 | 28 days supply | Qty: 3 | Fill #0

## 2020-08-11 DIAGNOSIS — M0589 Other rheumatoid arthritis with rheumatoid factor of multiple sites: Secondary | ICD-10-CM | POA: Diagnosis not present

## 2020-08-11 MED FILL — DOXYCYCLINE MONO 50 MG TAB: 50 | 60 days supply | Qty: 60 | Fill #0

## 2020-08-23 ENCOUNTER — Other Ambulatory Visit: Payer: Self-pay | Admitting: Sports Medicine

## 2020-08-23 MED FILL — LISINOPRIL-HCTZ 20-25 MG TA: 20-25 | 90 days supply | Qty: 90 | Fill #0

## 2020-08-23 MED FILL — BD TB SYRINGE 27GX1/2: 27G X 1/2" | 28 days supply | Qty: 4 | Fill #4

## 2020-08-23 MED FILL — METHOTREXATE 25 MG/ML VIAL: 50 | 28 days supply | Qty: 4 | Fill #0

## 2020-08-30 DIAGNOSIS — H16002 Unspecified corneal ulcer, left eye: Secondary | ICD-10-CM | POA: Diagnosis not present

## 2020-08-30 DIAGNOSIS — M0589 Other rheumatoid arthritis with rheumatoid factor of multiple sites: Secondary | ICD-10-CM | POA: Diagnosis not present

## 2020-08-30 DIAGNOSIS — Z6829 Body mass index (BMI) 29.0-29.9, adult: Secondary | ICD-10-CM | POA: Diagnosis not present

## 2020-08-30 DIAGNOSIS — M3500 Sicca syndrome, unspecified: Secondary | ICD-10-CM | POA: Diagnosis not present

## 2020-08-30 DIAGNOSIS — H16003 Unspecified corneal ulcer, bilateral: Secondary | ICD-10-CM | POA: Diagnosis not present

## 2020-08-30 DIAGNOSIS — M150 Primary generalized (osteo)arthritis: Secondary | ICD-10-CM | POA: Diagnosis not present

## 2020-08-30 DIAGNOSIS — E663 Overweight: Secondary | ICD-10-CM | POA: Diagnosis not present

## 2020-08-30 DIAGNOSIS — M79671 Pain in right foot: Secondary | ICD-10-CM | POA: Diagnosis not present

## 2020-09-06 MED FILL — ORENCIA 250 MG SOLR: 250 | 28 days supply | Qty: 3 | Fill #1

## 2020-09-09 DIAGNOSIS — M0589 Other rheumatoid arthritis with rheumatoid factor of multiple sites: Secondary | ICD-10-CM | POA: Diagnosis not present

## 2020-09-09 DIAGNOSIS — Z79899 Other long term (current) drug therapy: Secondary | ICD-10-CM | POA: Diagnosis not present

## 2020-09-14 ENCOUNTER — Other Ambulatory Visit (HOSPITAL_BASED_OUTPATIENT_CLINIC_OR_DEPARTMENT_OTHER): Payer: Self-pay | Admitting: Internal Medicine

## 2020-09-14 MED FILL — predniSONE 5 MG TABS: 5 | 30 days supply | Qty: 30 | Fill #0

## 2020-09-14 MED FILL — NIACIN ER 500 MG TBCR: 500 | 100 days supply | Qty: 100 | Fill #1

## 2020-09-14 MED FILL — ATORVASTATIN CALCIUM 40 MG: 40 | 90 days supply | Qty: 90 | Fill #3

## 2020-09-29 ENCOUNTER — Telehealth: Payer: Self-pay | Admitting: Pharmacist

## 2020-09-29 ENCOUNTER — Other Ambulatory Visit: Payer: Self-pay

## 2020-09-29 ENCOUNTER — Ambulatory Visit (HOSPITAL_BASED_OUTPATIENT_CLINIC_OR_DEPARTMENT_OTHER): Payer: 59 | Admitting: Pharmacist

## 2020-09-29 DIAGNOSIS — Z79899 Other long term (current) drug therapy: Secondary | ICD-10-CM

## 2020-09-29 NOTE — Progress Notes (Signed)
°  S: Patient presents for review of their specialty medication therapy.  Patient is currently taking Orencia for rheumatoid arthritis. Patient is managed by Dr. Amil Amen for this.   Adherence: denies any missed doses.   Efficacy: reports that it is still working very well for him  FDA-approved dosing: 750 mg every 4 weeks (gets infusion at [facility]).  Drug-drug interactions: none  Screenings: TB screening: completed per patient Hepatitis Screening: completed per patient Blood glucose: Orencia contains maltose which make falsely elevate glucose levels  Monitoring: S/sx of infection: denies.  S/sx of hypersensitivity: denies  Other adverse effects: denies  O:  Lab Results  Component Value Date   WBC 9.4 11/07/2019   HGB 14.7 11/07/2019   HCT 43.0 11/07/2019   MCV 92.1 11/07/2019   PLT 249 11/07/2019      Chemistry      Component Value Date/Time   NA 137 11/07/2019 0922   K 4.5 11/07/2019 0922   CL 104 11/07/2019 0922   CO2 21 11/07/2019 0922   BUN 24 11/07/2019 0922   CREATININE 1.06 11/07/2019 0922      Component Value Date/Time   CALCIUM 10.2 11/07/2019 0922   ALKPHOS 78 09/25/2016 0530   AST 14 11/07/2019 0922   ALT 18 11/07/2019 0922   BILITOT 0.8 11/07/2019 0922       A/P: 1. Medication review: Patient currently on [place] for the treatment of rheumatoid arthritis and is tolerating it well with continued improved control of RA. Reviewed the medication with the patient, including the following: Orencia is a selective T-cell costimulation blocker indicated for rheumatoid arthritis. The most common adverse effects are infections, headache, and injection site reactions. There is a possible adverse effect of increased risk of malignancy but it is not fully understood if this is due to the drug or the disease state itself. The patient was instructed to avoid use of live vaccinations without the approval of a physician. No recommendations for any  changes.  Benard Halsted, PharmD, [place], [place] [phone]

## 2020-09-29 NOTE — Telephone Encounter (Signed)
Called patient to schedule an appointment for the [facility]. I was unable to reach the patient so I left a HIPAA-compliant message requesting that the patient return my call.   

## 2020-10-04 MED FILL — DOXYCYCLINE MONO 50 MG TAB: 50 | 60 days supply | Qty: 60 | Fill #1

## 2020-10-04 MED FILL — ESOMEPRAZOLE MAG DR 40 MG C: 40 | 75 days supply | Qty: 75 | Fill #1

## 2020-10-11 MED FILL — ORENCIA 250 MG SOLR: 250 | 28 days supply | Qty: 3 | Fill #2

## 2020-10-18 ENCOUNTER — Encounter: Payer: Self-pay | Admitting: Sports Medicine

## 2020-10-18 ENCOUNTER — Other Ambulatory Visit: Payer: Self-pay

## 2020-10-18 ENCOUNTER — Ambulatory Visit (INDEPENDENT_AMBULATORY_CARE_PROVIDER_SITE_OTHER): Payer: 59 | Admitting: Sports Medicine

## 2020-10-18 ENCOUNTER — Other Ambulatory Visit: Payer: Self-pay | Admitting: Sports Medicine

## 2020-10-18 DIAGNOSIS — Z0000 Encounter for general adult medical examination without abnormal findings: Secondary | ICD-10-CM

## 2020-10-18 DIAGNOSIS — E782 Mixed hyperlipidemia: Secondary | ICD-10-CM | POA: Diagnosis not present

## 2020-10-18 DIAGNOSIS — M069 Rheumatoid arthritis, unspecified: Secondary | ICD-10-CM | POA: Diagnosis not present

## 2020-10-18 MED ORDER — REPATHA SURECLICK 140 MG/ML ~~LOC~~ SOAJ: 140.0000 mg | SUBCUTANEOUS | 11 refills | Status: DC

## 2020-10-18 MED FILL — predniSONE 5 MG TABS: 5 | 30 days supply | Qty: 30 | Fill #0

## 2020-10-18 MED FILL — BD TB SYRINGE 27GX1/2: 27G X 1/2" | 28 days supply | Qty: 4 | Fill #5

## 2020-10-18 MED FILL — METHOTREXATE 25 MG/ML VIAL: 50 | 28 days supply | Qty: 4 | Fill #0

## 2020-10-18 MED FILL — REPATHA SURECLICK 140 MG/ML: 140 | 84 days supply | Qty: 6 | Fill #0

## 2020-10-18 NOTE — Assessment & Plan Note (Signed)
Overall stable, managed with rheumatology, he is on daily prednisone so we will add a bone density test, last of which was done 2 years ago.

## 2020-10-18 NOTE — Progress Notes (Addendum)
Subjective:    CC: Annual Physical Exam  HPI:  This patient is here for their annual physical  I reviewed the past medical history, family history, social history, surgical history, and allergies today and no changes were needed.  Please see the problem list section below in epic for further details.  Past Medical History: Past Medical History:  Diagnosis Date  . Arthritis   . Cancer ([place])    Melanoma on  back  . Diverticulitis   . GERD (gastroesophageal reflux disease)   . High triglycerides   . History of abscess of skin and subcutaneous tissue    right forearm, chin  . Hypertension   . RA (rheumatoid arthritis) ([place])   . Reflux esophagitis   . Right inguinal hernia    Past Surgical History: Past Surgical History:  Procedure Laterality Date  . bilateral feet reconstruction  10/2000  . COLONOSCOPY    . feet surgery    . INGUINAL HERNIA REPAIR Right 11/27/2017   Procedure: OPEN RIGHT INGUINAL HERNIA REPAIR;  Surgeon: Armandina Gemma, MD;  Location: [facility];  Service: General;  Laterality: Right;  . INSERTION OF MESH Right 11/27/2017   Procedure: INSERTION OF MESH;  Surgeon: Armandina Gemma, MD;  Location: [facility];  Service: General;  Laterality: Right;  . MELANOMA EXCISION    . UPPER GI ENDOSCOPY  12/2011   Social History: Social History   Socioeconomic History  . Marital status: Married    Spouse name: Not on file  . Number of children: Not on file  . Years of education: Not on file  . Highest education level: Not on file  Occupational History  . Not on file  Tobacco Use  . Smoking status: Former Smoker    Quit date: 02/07/1991    Years since quitting: 29.8  . Smokeless tobacco: Former Systems developer    Quit date: 08/14/2009  Vaping Use  . Vaping Use: Never used  Substance and Sexual Activity  . Alcohol use: No  . Drug use: No  . Sexual activity: Yes    Partners: Female    Comment: married.   Other Topics Concern  . Not on file   Social History Narrative  . Not on file   Social Determinants of Health   Financial Resource Strain: Not on file  Food Insecurity: Not on file  Transportation Needs: Not on file  Physical Activity: Not on file  Stress: Not on file  Social Connections: Not on file   Family History: Family History  Problem Relation Age of Onset  . Hypertension Mother   . Hypertension Father    Allergies: No Known Allergies Medications: See med rec.  Review of Systems: No headache, visual changes, nausea, vomiting, diarrhea, constipation, dizziness, abdominal pain, skin rash, fevers, chills, night sweats, swollen lymph nodes, weight loss, chest pain, body aches, joint swelling, muscle aches, shortness of breath, mood changes, visual or auditory hallucinations.  Objective:    General: Well Developed, well nourished, and in no acute distress.  Neuro: Alert and oriented x3, extra-ocular muscles intact, sensation grossly intact. Cranial nerves II through XII are intact, motor, sensory, and coordinative functions are all intact. HEENT: Normocephalic, atraumatic, pupils equal round reactive to light, neck supple, no masses, no lymphadenopathy, thyroid nonpalpable. Oropharynx, nasopharynx, external ear canals are unremarkable. Skin: Warm and dry, no rashes noted.  Cardiac: Regular rate and rhythm, no murmurs rubs or gallops.  Respiratory: Clear to auscultation bilaterally. Not using accessory muscles, speaking in full  sentences.  Abdominal: Soft, nontender, nondistended, positive bowel sounds, no masses, no organomegaly.  Musculoskeletal: Shoulder, elbow, wrist, hip, knee, ankle stable, and with full range of motion.  Impression and Recommendations:    The patient was counselled, risk factors were discussed, anticipatory guidance given.  Annual physical exam Annual physical as above, he is due for colon cancer screening.  Hyperlipidemia, predominantly triglycerides Has not been tremendously  compliant with his Repatha injections. He will get a little better over the next month and then we can check his fasting lipids.  Rheumatoid arthritis (HCC) Overall stable, managed with rheumatology, he is on daily prednisone so we will add a bone density test, last of which was done 2 years ago.  Hypercalcemia Pain with a PTH to ensure this is not just erroneous.   ___________________________________________ Gwen Her. Dianah Field, M.D., ABFM., CAQSM. Primary Care and Sports Medicine [facility]  Adjunct Professor of [place] of [facility] of Medicine

## 2020-10-18 NOTE — Assessment & Plan Note (Signed)
Annual physical as above, he is due for colon cancer screening.

## 2020-10-18 NOTE — Assessment & Plan Note (Signed)
Has not been tremendously compliant with his Repatha injections. He will get a little better over the next month and then we can check his fasting lipids.

## 2020-10-27 DIAGNOSIS — Z1211 Encounter for screening for malignant neoplasm of colon: Secondary | ICD-10-CM | POA: Diagnosis not present

## 2020-10-27 DIAGNOSIS — Z86010 Personal history of colonic polyps: Secondary | ICD-10-CM | POA: Diagnosis not present

## 2020-10-27 DIAGNOSIS — K6389 Other specified diseases of intestine: Secondary | ICD-10-CM | POA: Diagnosis not present

## 2020-10-27 DIAGNOSIS — K635 Polyp of colon: Secondary | ICD-10-CM | POA: Diagnosis not present

## 2020-10-27 DIAGNOSIS — K5730 Diverticulosis of large intestine without perforation or abscess without bleeding: Secondary | ICD-10-CM | POA: Diagnosis not present

## 2020-10-27 LAB — HM COLONOSCOPY

## 2020-10-29 IMAGING — DX DG LUMBAR SPINE COMPLETE 4+V
5 series · 5 of 5 positions shown · non-contrast
Comparison: None.

CLINICAL DATA: Lumbago

EXAM:
LUMBAR SPINE - COMPLETE 4+ VIEW

[l-spine ap]
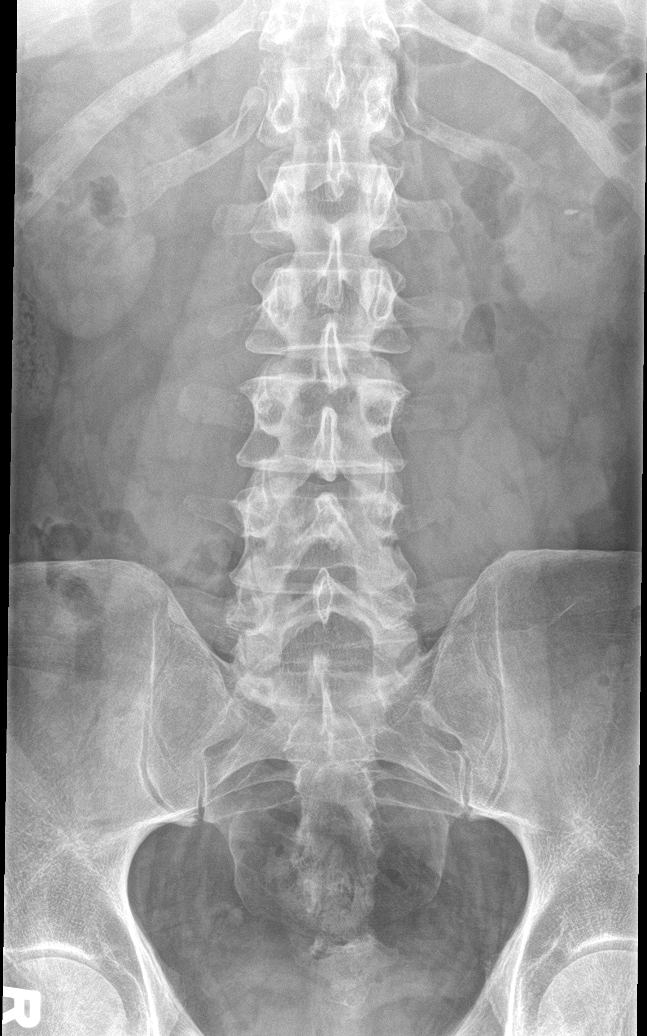

[l-spine obl (1 of 2)]
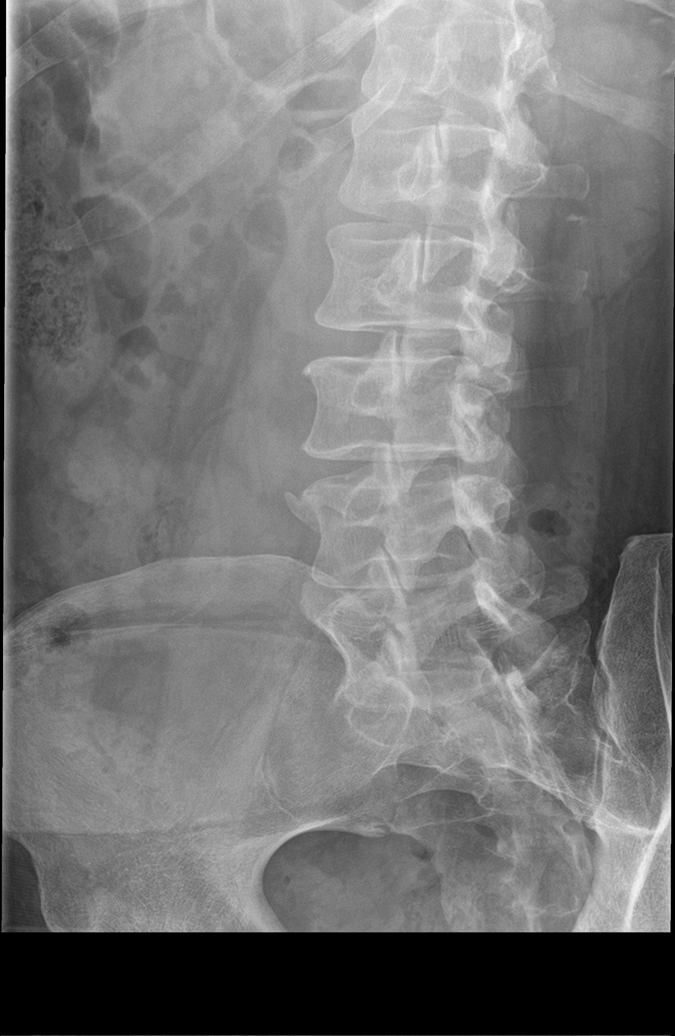

[l-spine obl (2 of 2)]
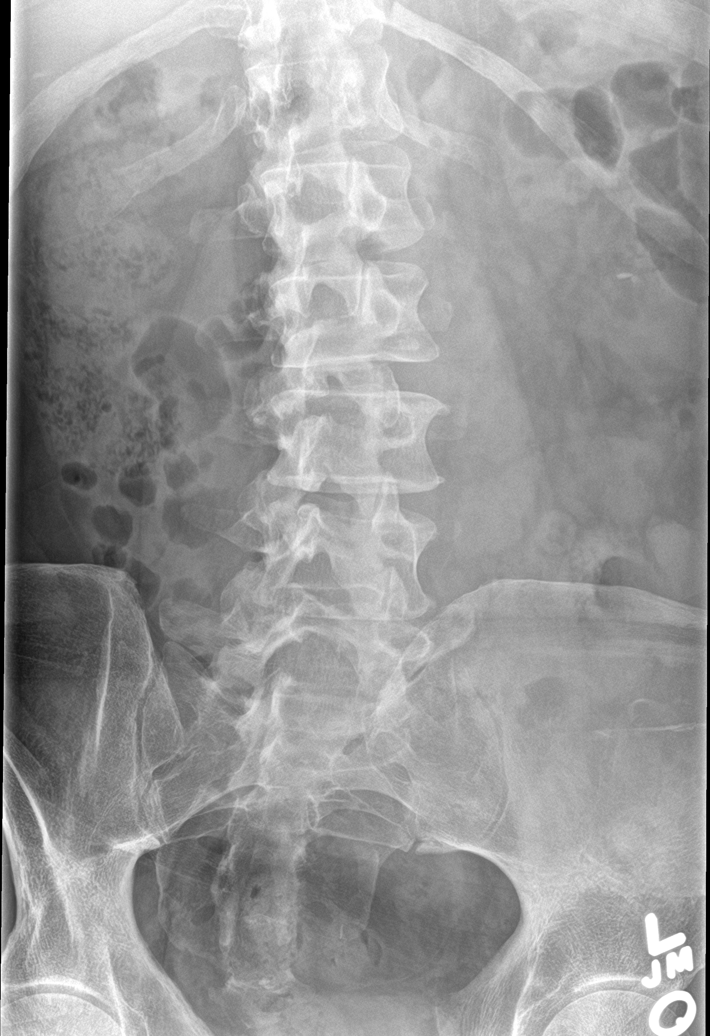

[l-spine lat]
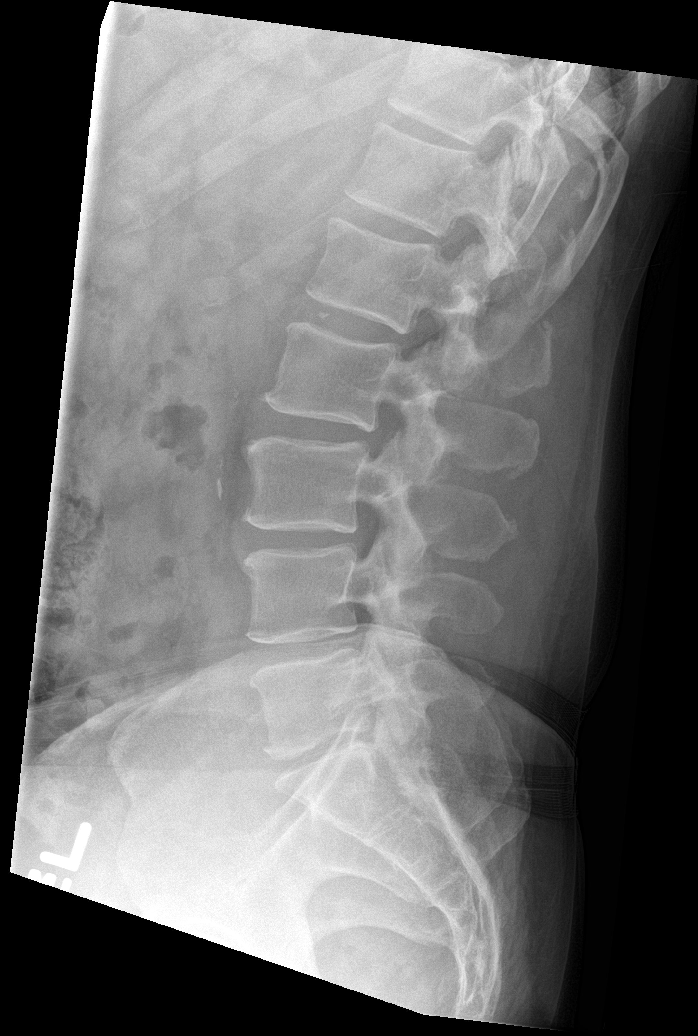

[l-spine spot]
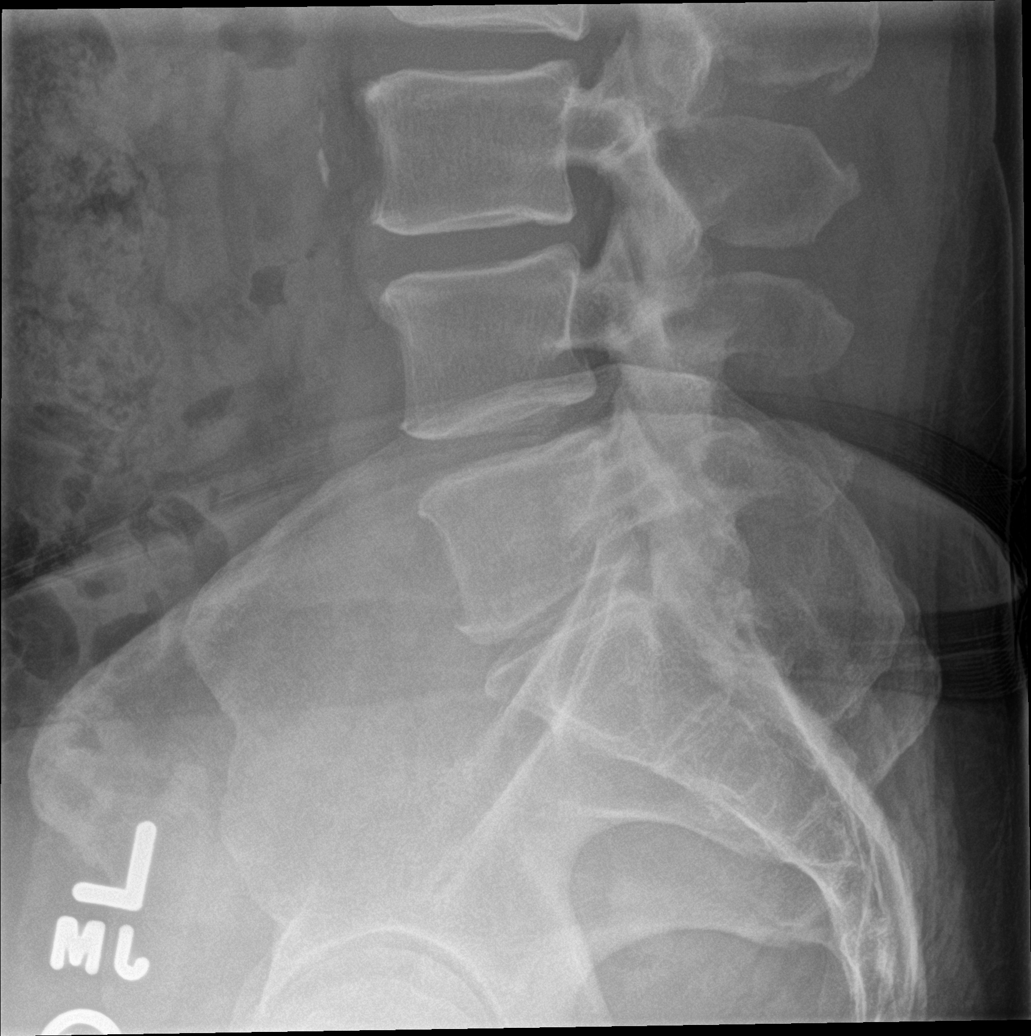

[5 of 5 positions shown; findings below may reference images not displayed]

FINDINGS: Frontal, lateral, spot lumbosacral lateral, and bilateral oblique
views were obtained. There are 5 non-rib-bearing lumbar type
vertebral bodies. There is no fracture or spondylolisthesis. There
is mild disc space narrowing at L5-S1. Other disc spaces appear
unremarkable. There is mild facet osteoarthritic change at L5-S1
bilaterally. Facets elsewhere appear unremarkable.

There is an apparent calculus in the lower pole of the left kidney
measuring 7 x 3 mm. There is aortic atherosclerosis.
IMPRESSION: Osteoarthritic change at L5-S1. Other disc spaces appear
unremarkable. No fracture or spondylolisthesis.

There is a 7 x 3 mm calculus in the lower pole left kidney. There is
aortic atherosclerosis.

Aortic Atherosclerosis ([id number]).

## 2020-11-01 ENCOUNTER — Other Ambulatory Visit: Payer: Self-pay | Admitting: Pharmacist

## 2020-11-01 MED ORDER — ORENCIA CLICKJECT 125 MG/ML ~~LOC~~ SOAJ: SUBCUTANEOUS | 5 refills | Status: DC

## 2020-11-01 MED FILL — ORENCIA CLICKJECT 125 MG/ML: 125 | 28 days supply | Qty: 4 | Fill #0

## 2020-11-15 ENCOUNTER — Other Ambulatory Visit: Payer: Self-pay | Admitting: Sports Medicine

## 2020-11-15 DIAGNOSIS — E782 Mixed hyperlipidemia: Secondary | ICD-10-CM

## 2020-11-16 ENCOUNTER — Other Ambulatory Visit: Payer: Self-pay | Admitting: Sports Medicine

## 2020-11-16 MED FILL — OMEGA-3-ACID ETHYL ESTERS 1: 1 | 90 days supply | Qty: 360 | Fill #0

## 2020-11-16 MED FILL — LISINOPRIL-HCTZ 20-25 MG TA: 20-25 | 90 days supply | Qty: 90 | Fill #0

## 2020-11-16 MED FILL — FENOFIBRATE 160 MG TABLET: 160 | 90 days supply | Qty: 90 | Fill #0

## 2020-11-16 MED FILL — predniSONE 5 MG TABS: 5 | 30 days supply | Qty: 30 | Fill #0

## 2020-11-17 ENCOUNTER — Ambulatory Visit (INDEPENDENT_AMBULATORY_CARE_PROVIDER_SITE_OTHER): Payer: 59

## 2020-11-17 ENCOUNTER — Other Ambulatory Visit: Payer: Self-pay

## 2020-11-17 DIAGNOSIS — M069 Rheumatoid arthritis, unspecified: Secondary | ICD-10-CM

## 2020-11-17 DIAGNOSIS — E782 Mixed hyperlipidemia: Secondary | ICD-10-CM | POA: Diagnosis not present

## 2020-11-17 DIAGNOSIS — Z13820 Encounter for screening for osteoporosis: Secondary | ICD-10-CM | POA: Diagnosis not present

## 2020-11-18 LAB — COMPREHENSIVE METABOLIC PANEL
AG Ratio: 2.1 (calc) (ref 1.0–2.5)
ALT: 23 U/L (ref 9–46)
AST: 21 U/L (ref 10–35)
Albumin: 4.6 g/dL (ref 3.6–5.1)
Alkaline phosphatase (APISO): 75 U/L (ref 35–144)
BUN/Creatinine Ratio: 25 (calc) — ABNORMAL HIGH (ref 6–22)
BUN: 26 mg/dL — ABNORMAL HIGH (ref 7–25)
CO2: 26 mmol/L (ref 20–32)
Calcium: 10.4 mg/dL — ABNORMAL HIGH (ref 8.6–10.3)
Chloride: 106 mmol/L (ref 98–110)
Creat: 1.03 mg/dL (ref 0.70–1.25)
Globulin: 2.2 g/dL (calc) (ref 1.9–3.7)
Glucose, Bld: 101 mg/dL — ABNORMAL HIGH (ref 65–99)
Potassium: 4.4 mmol/L (ref 3.5–5.3)
Sodium: 140 mmol/L (ref 135–146)
Total Bilirubin: 0.7 mg/dL (ref 0.2–1.2)
Total Protein: 6.8 g/dL (ref 6.1–8.1)

## 2020-11-18 LAB — CBC
HCT: 42.7 % (ref 38.5–50.0)
Hemoglobin: 14.8 g/dL (ref 13.2–17.1)
MCH: 32.2 pg (ref 27.0–33.0)
MCHC: 34.7 g/dL (ref 32.0–36.0)
MCV: 92.8 fL (ref 80.0–100.0)
MPV: 11.1 fL (ref 7.5–12.5)
Platelets: 279 10*3/uL (ref 140–400)
RBC: 4.6 10*6/uL (ref 4.20–5.80)
RDW: 12.8 % (ref 11.0–15.0)
WBC: 9.2 10*3/uL (ref 3.8–10.8)

## 2020-11-18 LAB — LIPID PANEL
Cholesterol: 71 mg/dL (ref <200)
HDL: 35 mg/dL — ABNORMAL LOW (ref >=40)
LDL Cholesterol (Calc): 10 mg/dL (calc)
Non-HDL Cholesterol (Calc): 36 mg/dL (calc) (ref <130)
Total CHOL/HDL Ratio: 2 (calc) (ref <5.0)
Triglycerides: 190 mg/dL — ABNORMAL HIGH (ref <150)

## 2020-11-18 LAB — HEMOGLOBIN A1C
Hgb A1c MFr Bld: 5.3 % of total Hgb (ref <5.7)
Mean Plasma Glucose: 105 mg/dL
eAG (mmol/L): 5.8 mmol/L

## 2020-11-18 LAB — TSH: TSH: 3.09 mIU/L (ref 0.40–4.50)

## 2020-11-18 NOTE — Assessment & Plan Note (Signed)
Pain with a PTH to ensure this is not just erroneous.

## 2020-11-18 NOTE — Addendum Note (Signed)
Addended by: Silverio Decamp on: 11/18/2020 08:32 AM   Modules accepted: Orders

## 2020-11-22 ENCOUNTER — Other Ambulatory Visit: Payer: Self-pay | Admitting: Pharmacist

## 2020-11-22 LAB — PTH, INTACT AND CALCIUM
Calcium: 9.9 mg/dL (ref 8.6–10.3)
PTH: 30 pg/mL (ref 14–64)

## 2020-11-22 MED ORDER — ORENCIA CLICKJECT 125 MG/ML ~~LOC~~ SOAJ: SUBCUTANEOUS | 5 refills | Status: DC

## 2020-11-22 MED FILL — ORENCIA CLICKJECT 125 MG/ML: 125 | 28 days supply | Qty: 4 | Fill #0

## 2020-12-08 DIAGNOSIS — M79671 Pain in right foot: Secondary | ICD-10-CM | POA: Diagnosis not present

## 2020-12-08 DIAGNOSIS — M150 Primary generalized (osteo)arthritis: Secondary | ICD-10-CM | POA: Diagnosis not present

## 2020-12-08 DIAGNOSIS — Z6829 Body mass index (BMI) 29.0-29.9, adult: Secondary | ICD-10-CM | POA: Diagnosis not present

## 2020-12-08 DIAGNOSIS — H16003 Unspecified corneal ulcer, bilateral: Secondary | ICD-10-CM | POA: Diagnosis not present

## 2020-12-08 DIAGNOSIS — H16002 Unspecified corneal ulcer, left eye: Secondary | ICD-10-CM | POA: Diagnosis not present

## 2020-12-08 DIAGNOSIS — E663 Overweight: Secondary | ICD-10-CM | POA: Diagnosis not present

## 2020-12-08 DIAGNOSIS — M0589 Other rheumatoid arthritis with rheumatoid factor of multiple sites: Secondary | ICD-10-CM | POA: Diagnosis not present

## 2020-12-08 DIAGNOSIS — M3500 Sicca syndrome, unspecified: Secondary | ICD-10-CM | POA: Diagnosis not present

## 2020-12-11 ENCOUNTER — Other Ambulatory Visit: Payer: Self-pay | Admitting: Sports Medicine

## 2020-12-11 DIAGNOSIS — E785 Hyperlipidemia, unspecified: Secondary | ICD-10-CM

## 2020-12-11 MED FILL — DOXYCYCLINE MONO 50 MG TAB: 50 | 60 days supply | Qty: 60 | Fill #2

## 2020-12-11 MED FILL — BD TB SYRINGE 27GX1/2: 27G X 1/2" | 28 days supply | Qty: 4 | Fill #6

## 2020-12-11 MED FILL — ESOMEPRAZOLE MAG DR 40 MG C: 40 | 75 days supply | Qty: 75 | Fill #2

## 2020-12-12 ENCOUNTER — Other Ambulatory Visit: Payer: Self-pay | Admitting: Sports Medicine

## 2020-12-13 DIAGNOSIS — H16403 Unspecified corneal neovascularization, bilateral: Secondary | ICD-10-CM | POA: Diagnosis not present

## 2020-12-13 DIAGNOSIS — H0015 Chalazion left lower eyelid: Secondary | ICD-10-CM | POA: Diagnosis not present

## 2020-12-13 DIAGNOSIS — M069 Rheumatoid arthritis, unspecified: Secondary | ICD-10-CM | POA: Diagnosis not present

## 2020-12-13 DIAGNOSIS — H18463 Peripheral corneal degeneration, bilateral: Secondary | ICD-10-CM | POA: Diagnosis not present

## 2020-12-13 DIAGNOSIS — H2513 Age-related nuclear cataract, bilateral: Secondary | ICD-10-CM | POA: Diagnosis not present

## 2020-12-13 DIAGNOSIS — H179 Unspecified corneal scar and opacity: Secondary | ICD-10-CM | POA: Diagnosis not present

## 2020-12-13 MED FILL — ATORVASTATIN CALCIUM 40 MG: 40 | 90 days supply | Qty: 90 | Fill #0

## 2020-12-14 ENCOUNTER — Other Ambulatory Visit (HOSPITAL_BASED_OUTPATIENT_CLINIC_OR_DEPARTMENT_OTHER): Payer: Self-pay | Admitting: Internal Medicine

## 2020-12-14 MED FILL — predniSONE 5 MG TABS: 5 | 30 days supply | Qty: 30 | Fill #0

## 2020-12-14 MED FILL — METHOTREXATE 25 MG/ML VIAL: 50 | 28 days supply | Qty: 4 | Fill #0

## 2020-12-30 MED FILL — NIACIN ER 500 MG TBCR: 500 | 100 days supply | Qty: 100 | Fill #2

## 2020-12-31 ENCOUNTER — Other Ambulatory Visit (HOSPITAL_COMMUNITY): Payer: Self-pay

## 2021-01-04 MED FILL — ORENCIA CLICKJECT 125 MG/ML: 125 | 28 days supply | Qty: 4 | Fill #1

## 2021-01-10 ENCOUNTER — Other Ambulatory Visit (HOSPITAL_BASED_OUTPATIENT_CLINIC_OR_DEPARTMENT_OTHER): Payer: Self-pay

## 2021-01-10 ENCOUNTER — Other Ambulatory Visit: Payer: Self-pay | Admitting: Sports Medicine

## 2021-01-10 DIAGNOSIS — E782 Mixed hyperlipidemia: Secondary | ICD-10-CM

## 2021-01-10 MED ORDER — REPATHA SURECLICK 140 MG/ML ~~LOC~~ SOAJ
SUBCUTANEOUS | 11 refills | Status: DC
  Filled 2021-01-10: qty 6, 90d supply, fill #0
  Filled 2021-04-05: qty 6, 90d supply, fill #1
  Filled 2021-07-21: qty 6, 90d supply, fill #2
  Filled 2021-11-14 (×2): qty 6, 90d supply, fill #3

## 2021-01-10 MED FILL — Evolocumab Subcutaneous Soln Auto-Injector 140 MG/ML: SUBCUTANEOUS | 30 days supply | Qty: 6 | Fill #0 | Status: CN

## 2021-01-19 ENCOUNTER — Other Ambulatory Visit: Payer: Self-pay | Admitting: Internal Medicine

## 2021-01-19 DIAGNOSIS — M79671 Pain in right foot: Secondary | ICD-10-CM | POA: Diagnosis not present

## 2021-01-19 DIAGNOSIS — M2021 Hallux rigidus, right foot: Secondary | ICD-10-CM | POA: Insufficient documentation

## 2021-01-19 DIAGNOSIS — M069 Rheumatoid arthritis, unspecified: Secondary | ICD-10-CM | POA: Diagnosis not present

## 2021-01-19 DIAGNOSIS — M2040 Other hammer toe(s) (acquired), unspecified foot: Secondary | ICD-10-CM | POA: Insufficient documentation

## 2021-01-19 DIAGNOSIS — M2041 Other hammer toe(s) (acquired), right foot: Secondary | ICD-10-CM | POA: Diagnosis not present

## 2021-01-21 ENCOUNTER — Other Ambulatory Visit: Payer: Self-pay | Admitting: Internal Medicine

## 2021-01-21 ENCOUNTER — Other Ambulatory Visit (HOSPITAL_BASED_OUTPATIENT_CLINIC_OR_DEPARTMENT_OTHER): Payer: Self-pay

## 2021-01-21 MED FILL — Prednisone Tab 5 MG: ORAL | 30 days supply | Qty: 30 | Fill #0 | Status: AC

## 2021-01-27 ENCOUNTER — Other Ambulatory Visit (HOSPITAL_COMMUNITY): Payer: Self-pay

## 2021-01-27 MED FILL — Abatacept Subcutaneous Soln Auto-Injector 125 MG/ML: SUBCUTANEOUS | 28 days supply | Qty: 4 | Fill #0 | Status: AC

## 2021-02-02 ENCOUNTER — Other Ambulatory Visit (HOSPITAL_BASED_OUTPATIENT_CLINIC_OR_DEPARTMENT_OTHER): Payer: Self-pay

## 2021-02-03 ENCOUNTER — Other Ambulatory Visit (HOSPITAL_COMMUNITY): Payer: Self-pay | Admitting: Orthopedic Surgery

## 2021-02-07 ENCOUNTER — Other Ambulatory Visit (HOSPITAL_COMMUNITY): Payer: Self-pay

## 2021-02-10 ENCOUNTER — Other Ambulatory Visit: Payer: Self-pay

## 2021-02-10 ENCOUNTER — Encounter (HOSPITAL_BASED_OUTPATIENT_CLINIC_OR_DEPARTMENT_OTHER): Payer: Self-pay | Admitting: Orthopedic Surgery

## 2021-02-14 ENCOUNTER — Other Ambulatory Visit (HOSPITAL_COMMUNITY): Payer: Medicare Other

## 2021-02-14 ENCOUNTER — Other Ambulatory Visit: Payer: Self-pay | Admitting: Sports Medicine

## 2021-02-14 ENCOUNTER — Other Ambulatory Visit: Payer: Self-pay | Admitting: Internal Medicine

## 2021-02-14 ENCOUNTER — Other Ambulatory Visit (HOSPITAL_BASED_OUTPATIENT_CLINIC_OR_DEPARTMENT_OTHER): Payer: Self-pay

## 2021-02-14 DIAGNOSIS — E782 Mixed hyperlipidemia: Secondary | ICD-10-CM

## 2021-02-14 MED ORDER — FENOFIBRATE 160 MG PO TABS
ORAL_TABLET | Freq: Every day | ORAL | 0 refills | Status: DC
  Filled 2021-02-14: qty 90, 90d supply, fill #0

## 2021-02-14 MED ORDER — LISINOPRIL-HYDROCHLOROTHIAZIDE 20-25 MG PO TABS
ORAL_TABLET | ORAL | 0 refills | Status: DC
  Filled 2021-02-14: qty 90, 90d supply, fill #0

## 2021-02-14 MED FILL — Doxycycline Monohydrate Tab 50 MG: ORAL | 60 days supply | Qty: 60 | Fill #0 | Status: AC

## 2021-02-14 MED FILL — Esomeprazole Magnesium Cap Delayed Release 40 MG (Base Eq): ORAL | 75 days supply | Qty: 75 | Fill #0 | Status: CN

## 2021-02-15 ENCOUNTER — Other Ambulatory Visit (HOSPITAL_COMMUNITY)
Admission: RE | Admit: 2021-02-15 | Discharge: 2021-02-15 | Disposition: A | Discharge status: Home / Self Care | Payer: 59 | Source: Ambulatory Visit | Attending: Orthopedic Surgery | Admitting: Orthopedic Surgery

## 2021-02-15 ENCOUNTER — Other Ambulatory Visit (HOSPITAL_BASED_OUTPATIENT_CLINIC_OR_DEPARTMENT_OTHER): Payer: Self-pay

## 2021-02-15 ENCOUNTER — Encounter (HOSPITAL_BASED_OUTPATIENT_CLINIC_OR_DEPARTMENT_OTHER)
Admission: RE | Admit: 2021-02-15 | Discharge: 2021-02-15 | Disposition: A | Discharge status: Home / Self Care | Payer: 59 | Source: Ambulatory Visit | Attending: Orthopedic Surgery | Admitting: Orthopedic Surgery

## 2021-02-15 DIAGNOSIS — Z20822 Contact with and (suspected) exposure to covid-19: Secondary | ICD-10-CM | POA: Diagnosis not present

## 2021-02-15 DIAGNOSIS — Z01812 Encounter for preprocedural laboratory examination: Secondary | ICD-10-CM | POA: Insufficient documentation

## 2021-02-15 DIAGNOSIS — Z01818 Encounter for other preprocedural examination: Secondary | ICD-10-CM | POA: Diagnosis not present

## 2021-02-15 LAB — BASIC METABOLIC PANEL
Anion gap: 7 (ref 5–15)
BUN: 28 mg/dL — ABNORMAL HIGH (ref 8–23)
CO2: 24 mmol/L (ref 22–32)
Calcium: 10.3 mg/dL (ref 8.9–10.3)
Chloride: 104 mmol/L (ref 98–111)
Creatinine, Ser: 1.07 mg/dL (ref 0.61–1.24)
GFR, Estimated: >60 mL/min (ref >60)
Glucose, Bld: 101 mg/dL — ABNORMAL HIGH (ref 70–99)
Potassium: 4.4 mmol/L (ref 3.5–5.1)
Sodium: 135 mmol/L (ref 135–145)

## 2021-02-15 LAB — SARS CORONAVIRUS 2 (TAT 6-24 HRS): SARS Coronavirus 2: NEGATIVE

## 2021-02-15 NOTE — Progress Notes (Signed)
Anesthesia consult per Dr. Miller, will proceed with surgery as scheduled.  

## 2021-02-15 NOTE — Progress Notes (Signed)

## 2021-02-16 ENCOUNTER — Other Ambulatory Visit (HOSPITAL_BASED_OUTPATIENT_CLINIC_OR_DEPARTMENT_OTHER): Payer: Self-pay

## 2021-02-16 ENCOUNTER — Other Ambulatory Visit: Payer: Self-pay

## 2021-02-16 MED ORDER — METHOTREXATE SODIUM CHEMO INJECTION 50 MG/2ML
INTRAMUSCULAR | 3 refills | Status: DC
  Filled 2021-02-16: qty 4, 30d supply, fill #0
  Filled 2021-03-11: qty 4, 28d supply, fill #1
  Filled 2021-03-14: qty 4, 30d supply, fill #1
  Filled 2021-04-28: qty 4, 30d supply, fill #2
  Filled 2021-09-05: qty 4, 30d supply, fill #3

## 2021-02-16 MED ORDER — "TUBERCULIN SYRINGE 27G X 1/2"" 1 ML MISC"
11 refills | Status: DC
  Filled 2021-02-16: qty 4, 28d supply, fill #0

## 2021-02-16 MED ORDER — PREDNISONE 5 MG PO TABS
5.0000 mg | ORAL_TABLET | Freq: Every day | ORAL | 0 refills | Status: DC
  Filled 2021-02-16: qty 30, 30d supply, fill #0

## 2021-02-17 ENCOUNTER — Encounter (HOSPITAL_BASED_OUTPATIENT_CLINIC_OR_DEPARTMENT_OTHER): Payer: Self-pay | Admitting: Orthopedic Surgery

## 2021-02-17 ENCOUNTER — Ambulatory Visit (HOSPITAL_BASED_OUTPATIENT_CLINIC_OR_DEPARTMENT_OTHER)
Admission: RE | Admit: 2021-02-17 | Discharge: 2021-02-17 | Disposition: A | Discharge status: Home / Self Care | Payer: 59 | Attending: Orthopedic Surgery | Admitting: Orthopedic Surgery

## 2021-02-17 ENCOUNTER — Encounter (HOSPITAL_BASED_OUTPATIENT_CLINIC_OR_DEPARTMENT_OTHER)
Admission: RE | Disposition: A | Discharge status: Home / Self Care | Payer: Self-pay | Source: Home / Self Care | Attending: Orthopedic Surgery

## 2021-02-17 ENCOUNTER — Ambulatory Visit (HOSPITAL_BASED_OUTPATIENT_CLINIC_OR_DEPARTMENT_OTHER): Payer: 59 | Admitting: Anesthesiology

## 2021-02-17 ENCOUNTER — Other Ambulatory Visit (HOSPITAL_BASED_OUTPATIENT_CLINIC_OR_DEPARTMENT_OTHER): Payer: Self-pay

## 2021-02-17 ENCOUNTER — Other Ambulatory Visit: Payer: Self-pay

## 2021-02-17 DIAGNOSIS — Z87891 Personal history of nicotine dependence: Secondary | ICD-10-CM | POA: Diagnosis not present

## 2021-02-17 DIAGNOSIS — M2011 Hallux valgus (acquired), right foot: Secondary | ICD-10-CM | POA: Diagnosis not present

## 2021-02-17 DIAGNOSIS — Z79899 Other long term (current) drug therapy: Secondary | ICD-10-CM | POA: Diagnosis not present

## 2021-02-17 DIAGNOSIS — G8918 Other acute postprocedural pain: Secondary | ICD-10-CM | POA: Diagnosis not present

## 2021-02-17 DIAGNOSIS — M069 Rheumatoid arthritis, unspecified: Secondary | ICD-10-CM | POA: Insufficient documentation

## 2021-02-17 DIAGNOSIS — M2041 Other hammer toe(s) (acquired), right foot: Secondary | ICD-10-CM | POA: Diagnosis not present

## 2021-02-17 DIAGNOSIS — Z85820 Personal history of malignant melanoma of skin: Secondary | ICD-10-CM | POA: Diagnosis not present

## 2021-02-17 DIAGNOSIS — M216X1 Other acquired deformities of right foot: Secondary | ICD-10-CM | POA: Diagnosis not present

## 2021-02-17 DIAGNOSIS — Z8719 Personal history of other diseases of the digestive system: Secondary | ICD-10-CM | POA: Insufficient documentation

## 2021-02-17 DIAGNOSIS — M06871 Other specified rheumatoid arthritis, right ankle and foot: Secondary | ICD-10-CM | POA: Diagnosis not present

## 2021-02-17 DIAGNOSIS — Z8249 Family history of ischemic heart disease and other diseases of the circulatory system: Secondary | ICD-10-CM | POA: Insufficient documentation

## 2021-02-17 DIAGNOSIS — M6701 Short Achilles tendon (acquired), right ankle: Secondary | ICD-10-CM | POA: Diagnosis not present

## 2021-02-17 DIAGNOSIS — I10 Essential (primary) hypertension: Secondary | ICD-10-CM | POA: Diagnosis not present

## 2021-02-17 DIAGNOSIS — K219 Gastro-esophageal reflux disease without esophagitis: Secondary | ICD-10-CM | POA: Diagnosis not present

## 2021-02-17 DIAGNOSIS — M06872 Other specified rheumatoid arthritis, left ankle and foot: Secondary | ICD-10-CM | POA: Diagnosis not present

## 2021-02-17 HISTORY — PX: TRANSMETATARSAL AMPUTATION: SHX6197

## 2021-02-17 HISTORY — DX: Sleep apnea, unspecified: G47.30

## 2021-02-17 SURGERY — AMPUTATION, FOOT, TRANSMETATARSAL
Anesthesia: General | Site: Foot | Laterality: Right

## 2021-02-17 MED ORDER — PROMETHAZINE HCL 25 MG/ML IJ SOLN: 6.2500 mg | INTRAMUSCULAR | Status: DC | PRN

## 2021-02-17 MED ORDER — AMISULPRIDE (ANTIEMETIC) 5 MG/2ML IV SOLN: 10.0000 mg | Freq: Once | INTRAVENOUS | Status: DC | PRN

## 2021-02-17 MED ORDER — CEFAZOLIN SODIUM-DEXTROSE 2-4 GM/100ML-% IV SOLN
2.0000 g | INTRAVENOUS | Status: AC
  Administered 2021-02-17: 2 g via INTRAVENOUS

## 2021-02-17 MED ORDER — VANCOMYCIN HCL 500 MG IV SOLR
INTRAVENOUS | Status: AC
  Filled 2021-02-17: qty 500

## 2021-02-17 MED ORDER — ROPIVACAINE HCL 5 MG/ML IJ SOLN
INTRAMUSCULAR | Status: DC | PRN
  Administered 2021-02-17: 30 mL via PERINEURAL

## 2021-02-17 MED ORDER — OXYCODONE HCL 5 MG PO TABS: 5.0000 mg | ORAL_TABLET | Freq: Once | ORAL | Status: DC | PRN

## 2021-02-17 MED ORDER — SENNA 8.6 MG PO TABS
2.0000 | ORAL_TABLET | Freq: Two times a day (BID) | ORAL | 0 refills | Status: DC
  Filled 2021-02-17: qty 30, 8d supply, fill #0

## 2021-02-17 MED ORDER — DEXAMETHASONE SODIUM PHOSPHATE 10 MG/ML IJ SOLN
INTRAMUSCULAR | Status: AC
  Filled 2021-02-17: qty 1

## 2021-02-17 MED ORDER — OXYCODONE HCL 5 MG/5ML PO SOLN: 5.0000 mg | Freq: Once | ORAL | Status: DC | PRN

## 2021-02-17 MED ORDER — LIDOCAINE 2% (20 MG/ML) 5 ML SYRINGE
INTRAMUSCULAR | Status: AC
  Filled 2021-02-17: qty 5

## 2021-02-17 MED ORDER — CEFAZOLIN SODIUM-DEXTROSE 2-4 GM/100ML-% IV SOLN
INTRAVENOUS | Status: AC
  Filled 2021-02-17: qty 100

## 2021-02-17 MED ORDER — 0.9 % SODIUM CHLORIDE (POUR BTL) OPTIME
TOPICAL | Status: DC | PRN
  Administered 2021-02-17: 200 mL

## 2021-02-17 MED ORDER — PROPOFOL 10 MG/ML IV BOLUS
INTRAVENOUS | Status: AC
  Filled 2021-02-17: qty 40

## 2021-02-17 MED ORDER — SODIUM CHLORIDE 0.9 % IV SOLN: INTRAVENOUS | Status: DC

## 2021-02-17 MED ORDER — MEPERIDINE HCL 25 MG/ML IJ SOLN: 6.2500 mg | INTRAMUSCULAR | Status: DC | PRN

## 2021-02-17 MED ORDER — FENTANYL CITRATE (PF) 100 MCG/2ML IJ SOLN
INTRAMUSCULAR | Status: DC | PRN
  Administered 2021-02-17: 25 ug via INTRAVENOUS

## 2021-02-17 MED ORDER — PROPOFOL 10 MG/ML IV BOLUS
INTRAVENOUS | Status: AC
  Filled 2021-02-17: qty 20

## 2021-02-17 MED ORDER — PROPOFOL 10 MG/ML IV BOLUS
INTRAVENOUS | Status: DC | PRN
  Administered 2021-02-17: 200 mg via INTRAVENOUS

## 2021-02-17 MED ORDER — DOCUSATE SODIUM 100 MG PO CAPS
100.0000 mg | ORAL_CAPSULE | Freq: Two times a day (BID) | ORAL | 0 refills | Status: DC
  Filled 2021-02-17: qty 30, 15d supply, fill #0

## 2021-02-17 MED ORDER — MIDAZOLAM HCL 2 MG/2ML IJ SOLN
2.0000 mg | Freq: Once | INTRAMUSCULAR | Status: AC
  Administered 2021-02-17: 2 mg via INTRAVENOUS

## 2021-02-17 MED ORDER — FENTANYL CITRATE (PF) 100 MCG/2ML IJ SOLN
INTRAMUSCULAR | Status: AC
  Filled 2021-02-17: qty 2

## 2021-02-17 MED ORDER — ONDANSETRON HCL 4 MG/2ML IJ SOLN
INTRAMUSCULAR | Status: AC
  Filled 2021-02-17: qty 2

## 2021-02-17 MED ORDER — ROCURONIUM BROMIDE 10 MG/ML (PF) SYRINGE
PREFILLED_SYRINGE | INTRAVENOUS | Status: AC
  Filled 2021-02-17: qty 10

## 2021-02-17 MED ORDER — BUPIVACAINE HCL (PF) 0.5 % IJ SOLN
INTRAMUSCULAR | Status: AC
  Filled 2021-02-17: qty 30

## 2021-02-17 MED ORDER — FENTANYL CITRATE (PF) 100 MCG/2ML IJ SOLN
100.0000 ug | Freq: Once | INTRAMUSCULAR | Status: AC
  Administered 2021-02-17: 100 ug via INTRAVENOUS

## 2021-02-17 MED ORDER — LIDOCAINE HCL (CARDIAC) PF 100 MG/5ML IV SOSY
PREFILLED_SYRINGE | INTRAVENOUS | Status: DC | PRN
  Administered 2021-02-17: 30 mg via INTRAVENOUS

## 2021-02-17 MED ORDER — OXYCODONE HCL 5 MG PO TABS
5.0000 mg | ORAL_TABLET | ORAL | 0 refills | Status: AC | PRN
  Filled 2021-02-17: qty 18, 3d supply, fill #0

## 2021-02-17 MED ORDER — LACTATED RINGERS IV SOLN: INTRAVENOUS | Status: DC

## 2021-02-17 MED ORDER — MIDAZOLAM HCL 2 MG/2ML IJ SOLN
INTRAMUSCULAR | Status: AC
  Filled 2021-02-17: qty 2

## 2021-02-17 MED ORDER — HYDROMORPHONE HCL 1 MG/ML IJ SOLN: 0.2500 mg | INTRAMUSCULAR | Status: DC | PRN

## 2021-02-17 MED ORDER — DEXAMETHASONE SODIUM PHOSPHATE 10 MG/ML IJ SOLN
INTRAMUSCULAR | Status: DC | PRN
  Administered 2021-02-17: 10 mg via INTRAVENOUS

## 2021-02-17 MED ORDER — VANCOMYCIN HCL 500 MG IV SOLR
INTRAVENOUS | Status: DC | PRN
  Administered 2021-02-17: 500 mg via TOPICAL

## 2021-02-17 MED ORDER — LIDOCAINE-EPINEPHRINE 1 %-1:100000 IJ SOLN
INTRAMUSCULAR | Status: AC
  Filled 2021-02-17: qty 1

## 2021-02-17 MED ORDER — PROPOFOL 500 MG/50ML IV EMUL
INTRAVENOUS | Status: DC | PRN
  Administered 2021-02-17: 25 ug/kg/min via INTRAVENOUS

## 2021-02-17 SURGICAL SUPPLY — 66 items
BANDAGE ESMARK 6X9 LF (GAUZE/BANDAGES/DRESSINGS) IMPLANT
BLADE AVERAGE 25X9 (BLADE) IMPLANT
BLADE MICRO SAGITTAL (BLADE) ×2 IMPLANT
BLADE OSC/SAG .038X5.5 CUT EDG (BLADE) IMPLANT
BLADE SURG 10 STRL SS (BLADE) ×4 IMPLANT
BLADE SURG 15 STRL LF DISP TIS (BLADE) ×1 IMPLANT
BLADE SURG 15 STRL SS (BLADE) ×1
BNDG COHESIVE 4X5 TAN STRL (GAUZE/BANDAGES/DRESSINGS) ×2 IMPLANT
BNDG CONFORM 3 STRL LF (GAUZE/BANDAGES/DRESSINGS) ×2 IMPLANT
BNDG ESMARK 4X9 LF (GAUZE/BANDAGES/DRESSINGS) IMPLANT
BNDG ESMARK 6X9 LF (GAUZE/BANDAGES/DRESSINGS)
BOOT STEPPER DURA LG (SOFTGOODS) ×2 IMPLANT
CHLORAPREP W/TINT 26 (MISCELLANEOUS) ×2 IMPLANT
COVER BACK TABLE 60X90IN (DRAPES) ×2 IMPLANT
COVER WAND RF STERILE (DRAPES) IMPLANT
CUFF TOURN SGL QUICK 24 (TOURNIQUET CUFF) ×1
CUFF TOURN SGL QUICK 34 (TOURNIQUET CUFF)
CUFF TRNQT CYL 24X4X16.5-23 (TOURNIQUET CUFF) ×1 IMPLANT
CUFF TRNQT CYL 34X4.125X (TOURNIQUET CUFF) IMPLANT
DECANTER SPIKE VIAL GLASS SM (MISCELLANEOUS) IMPLANT
DRAPE EXTREMITY T 121X128X90 (DISPOSABLE) ×2 IMPLANT
DRAPE SURG 17X23 STRL (DRAPES) ×2 IMPLANT
DRAPE U-SHAPE 47X51 STRL (DRAPES) ×2 IMPLANT
DRSG EMULSION OIL 3X3 NADH (GAUZE/BANDAGES/DRESSINGS) IMPLANT
DRSG MEPITEL 4X7.2 (GAUZE/BANDAGES/DRESSINGS) ×2 IMPLANT
DRSG PAD ABDOMINAL 8X10 ST (GAUZE/BANDAGES/DRESSINGS) IMPLANT
ELECT REM PT RETURN 9FT ADLT (ELECTROSURGICAL) ×2
ELECTRODE REM PT RTRN 9FT ADLT (ELECTROSURGICAL) ×1 IMPLANT
GAUZE SPONGE 4X4 12PLY STRL (GAUZE/BANDAGES/DRESSINGS) ×2 IMPLANT
GLOVE SRG 8 PF TXTR STRL LF DI (GLOVE) ×2 IMPLANT
GLOVE SURG ENC MOIS LTX SZ8 (GLOVE) ×2 IMPLANT
GLOVE SURG LTX SZ8 (GLOVE) ×2 IMPLANT
GLOVE SURG POLYISO LF SZ7 (GLOVE) ×2 IMPLANT
GLOVE SURG UNDER POLY LF SZ7 (GLOVE) ×4 IMPLANT
GLOVE SURG UNDER POLY LF SZ8 (GLOVE) ×2
GOWN STRL REUS W/ TWL LRG LVL3 (GOWN DISPOSABLE) ×1 IMPLANT
GOWN STRL REUS W/ TWL XL LVL3 (GOWN DISPOSABLE) ×2 IMPLANT
GOWN STRL REUS W/TWL LRG LVL3 (GOWN DISPOSABLE) ×1
GOWN STRL REUS W/TWL XL LVL3 (GOWN DISPOSABLE) ×2
NDL SAFETY ECLIPSE 18X1.5 (NEEDLE) IMPLANT
NEEDLE HYPO 18GX1.5 SHARP (NEEDLE)
NEEDLE HYPO 25X1 1.5 SAFETY (NEEDLE) IMPLANT
NS IRRIG 1000ML POUR BTL (IV SOLUTION) ×2 IMPLANT
PACK BASIN DAY SURGERY FS (CUSTOM PROCEDURE TRAY) ×2 IMPLANT
PAD CAST 4YDX4 CTTN HI CHSV (CAST SUPPLIES) ×1 IMPLANT
PADDING CAST COTTON 4X4 STRL (CAST SUPPLIES) ×1
PENCIL SMOKE EVACUATOR (MISCELLANEOUS) ×2 IMPLANT
SANITIZER HAND PURELL 535ML FO (MISCELLANEOUS) ×2 IMPLANT
SHEET MEDIUM DRAPE 40X70 STRL (DRAPES) ×2 IMPLANT
SLEEVE SCD COMPRESS KNEE MED (STOCKING) ×2 IMPLANT
SPONGE LAP 18X18 RF (DISPOSABLE) ×2 IMPLANT
STOCKINETTE 6  STRL (DRAPES) ×1
STOCKINETTE 6 STRL (DRAPES) ×1 IMPLANT
SUCTION FRAZIER HANDLE 10FR (MISCELLANEOUS) ×1
SUCTION TUBE FRAZIER 10FR DISP (MISCELLANEOUS) ×1 IMPLANT
SUT ETHILON 2 0 FS 18 (SUTURE) ×6 IMPLANT
SUT ETHILON 2 0 FSLX (SUTURE) IMPLANT
SUT ETHILON 3 0 PS 1 (SUTURE) IMPLANT
SUT MNCRL AB 3-0 PS2 18 (SUTURE) IMPLANT
SWAB COLLECTION DEVICE MRSA (MISCELLANEOUS) IMPLANT
SWAB CULTURE ESWAB REG 1ML (MISCELLANEOUS) IMPLANT
SYR BULB EAR ULCER 3OZ GRN STR (SYRINGE) ×2 IMPLANT
SYR CONTROL 10ML LL (SYRINGE) IMPLANT
TOWEL GREEN STERILE FF (TOWEL DISPOSABLE) ×2 IMPLANT
TUBE CONNECTING 20X1/4 (TUBING) ×2 IMPLANT
UNDERPAD 30X36 HEAVY ABSORB (UNDERPADS AND DIAPERS) ×2 IMPLANT

## 2021-02-17 NOTE — Anesthesia Procedure Notes (Signed)
Procedure Name: LMA Insertion Date/Time: 02/17/2021 7:37 AM Performed by: Signe Colt, CRNA Pre-anesthesia Checklist: Patient identified, Emergency Drugs available, Suction available and Patient being monitored Patient Re-evaluated:Patient Re-evaluated prior to induction Oxygen Delivery Method: Circle System Utilized Preoxygenation: Pre-oxygenation with 100% oxygen Induction Type: IV induction Ventilation: Mask ventilation without difficulty LMA: LMA inserted LMA Size: 4.0 Number of attempts: 1 Airway Equipment and Method: bite block Placement Confirmation: positive ETCO2 Tube secured with: Tape Dental Injury: Teeth and Oropharynx as per pre-operative assessment

## 2021-02-17 NOTE — Discharge Instructions (Addendum)
Todd Hewitt, MD [facility]  Please read the following information regarding your care after surgery.  Medications  You only need a prescription for the narcotic pain medicine (ex. oxycodone, Percocet, Norco).  All of the other medicines listed below are available over the counter. X Aleve 2 pills twice a day for the first 3 days after surgery. X acetominophen (Tylenol) 650 mg every 4-6 hours as you need for minor to moderate pain X oxycodone as prescribed for severe pain  Narcotic pain medicine (ex. oxycodone, Percocet, Vicodin) will cause constipation.  To prevent this problem, take the following medicines while you are taking any pain medicine. X docusate sodium (Colace) 100 mg twice a day X senna (Senokot) 2 tablets twice a day   Weight Bearing X Bear weight only on your operated foot in the CAM boot.   Cast / Splint / Dressing X Keep your splint, cast or dressing clean and dry.  Don't put anything (coat hanger, pencil, etc) down inside of it.  If it gets damp, use a hair dryer on the cool setting to dry it.  If it gets soaked, call the office to schedule an appointment for a cast change.   After your dressing, cast or splint is removed; you may shower, but do not soak or scrub the wound.  Allow the water to run over it, and then gently pat it dry.  Swelling It is normal for you to have swelling where you had surgery.  To reduce swelling and pain, keep your toes above your nose for at least 3 days after surgery.  It may be necessary to keep your foot or leg elevated for several weeks.  If it hurts, it should be elevated.  Follow Up Call my office at [phone] when you are discharged from the hospital or surgery center to schedule an appointment to be seen two weeks after surgery.  Call my office at [phone] if you develop a fever >101.5 F, nausea, vomiting, bleeding from the surgical site or severe pain.    Post Anesthesia Home Care Instructions  Activity: Get plenty  of rest for the remainder of the day. A responsible individual must stay with you for 24 hours following the procedure.  For the next 24 hours, DO NOT: -Drive a car -Operate machinery -Drink alcoholic beverages -Take any medication unless instructed by your physician -Make any legal decisions or sign important papers.  Meals: Start with liquid foods such as gelatin or soup. Progress to regular foods as tolerated. Avoid greasy, spicy, heavy foods. If nausea and/or vomiting occur, drink only clear liquids until the nausea and/or vomiting subsides. Call your physician if vomiting continues.  Special Instructions/Symptoms: Your throat may feel dry or sore from the anesthesia or the breathing tube placed in your throat during surgery. If this causes discomfort, gargle with warm salt water. The discomfort should disappear within 24 hours.  If you had a scopolamine patch placed behind your ear for the management of post- operative nausea and/or vomiting:  1. The medication in the patch is effective for 72 hours, after which it should be removed.  Wrap patch in a tissue and discard in the trash. Wash hands thoroughly with soap and water. 2. You may remove the patch earlier than 72 hours if you experience unpleasant side effects which may include dry mouth, dizziness or visual disturbances. 3. Avoid touching the patch. Wash your hands with soap and water after contact with the patch.    Regional Anesthesia Blocks  1.   Numbness or the inability to move the "blocked" extremity may last from 3-48 hours after placement. The length of time depends on the medication injected and your individual response to the medication. If the numbness is not going away after 48 hours, call your surgeon.  2. The extremity that is blocked will need to be protected until the numbness is gone and the  Strength has returned. Because you cannot feel it, you will need to take extra care to avoid injury. Because it may be weak,  you may have difficulty moving it or using it. You may not know what position it is in without looking at it while the block is in effect.  3. For blocks in the legs and feet, returning to weight bearing and walking needs to be done carefully. You will need to wait until the numbness is entirely gone and the strength has returned. You should be able to move your leg and foot normally before you try and bear weight or walk. You will need someone to be with you when you first try to ensure you do not fall and possibly risk injury.  4. Bruising and tenderness at the needle site are common side effects and will resolve in a few days.  5. Persistent numbness or new problems with movement should be communicated to the surgeon or the [facility] ([phone])/ [facility] ([phone]).   

## 2021-02-17 NOTE — H&P (Signed)
Eli Pattillo is an 60 y.o. male.   Chief Complaint: Right foot pain HPI: The patient is a 60 year old male with a past medical history significant for rheumatoid arthritis complicated by severe bilateral forefoot deformities.  He has had multiple right forefoot surgeries but still has significant right forefoot pain.  There are no further feasible reconstructive options.  He has failed nonoperative treatment and presents today for transmetatarsal amputation.  Past Medical History:  Diagnosis Date  . Arthritis   . Cancer ([place])    Melanoma on  back  . Diverticulitis   . GERD (gastroesophageal reflux disease)   . High triglycerides   . History of abscess of skin and subcutaneous tissue    right forearm, chin  . Hypertension   . RA (rheumatoid arthritis) ([place])   . Reflux esophagitis   . Right inguinal hernia   . Sleep apnea    no cpap    Past Surgical History:  Procedure Laterality Date  . bilateral feet reconstruction  10/2000  . COLONOSCOPY    . feet surgery    . INGUINAL HERNIA REPAIR Right 11/27/2017   Procedure: OPEN RIGHT INGUINAL HERNIA REPAIR;  Surgeon: Armandina Gemma, MD;  Location: [place];  Service: General;  Laterality: Right;  . INSERTION OF MESH Right 11/27/2017   Procedure: INSERTION OF MESH;  Surgeon: Armandina Gemma, MD;  Location: [facility];  Service: General;  Laterality: Right;  . MELANOMA EXCISION    . UPPER GI ENDOSCOPY  12/2011    Family History  Problem Relation Age of Onset  . Hypertension Mother   . Hypertension Father    Social History:  reports that he quit smoking about 30 years ago. He quit smokeless tobacco use about 11 years ago. He reports that he does not drink alcohol and does not use drugs.  Allergies: No Known Allergies  Medications Prior to Admission  Medication Sig Dispense Refill  . Abatacept 125 MG/ML SOAJ INJECT 1 ML SUBCUTANEOUSLY ONCE A WEEK 28 4 mL 5  . Abatacept 125 MG/ML SOAJ INJECT 1 ML SUBCUTANEOUS  ONCE A WEEK 28 4 mL 5  . atorvastatin (LIPITOR) 40 MG tablet TAKE 1 TABLET BY MOUTH ONCE DAILY AT 6PM 90 tablet 3  . cyclobenzaprine (FLEXERIL) 10 MG tablet One half tab PO qHS, then increase gradually to one tab TID. 30 tablet 0  . doxycycline (ADOXA) 50 MG tablet TAKE 1/2 TABLET BY MOUTH TWO TIMES DAILY 60 tablet 5  . esomeprazole (NEXIUM) 40 MG capsule TAKE 1 CAPSULE (40 MG TOTAL) BY MOUTH DAILY AT 12:00 NOON. 75 capsule 3  . Evolocumab (REPATHA SURECLICK) 086 MG/ML SOAJ INJECT 140MG  INTO THE SKIN EVERY 14 DAYS 6 mL 11  . fenofibrate 160 MG tablet TAKE 1 TABLET BY MOUTH ONCE DAILY 90 tablet 0  . lisinopril-hydrochlorothiazide (ZESTORETIC) 20-25 MG tablet TAKE 1 TABLET BY MOUTH ONCE DAILY **NEEDS APPOINTMENT FOR FURTHER REFILLS** 90 tablet 0  . methotrexate 50 MG/2ML injection INJECT 1 ML ONCE A WEEK, ON FRIDAYS  2  . methotrexate 50 MG/2ML injection INJECT 1 ML INTO THE MUSCLE ONCE WEEKLY 4 mL 0  . niacin (SLO-NIACIN) 500 MG tablet TAKE 1 TABLET (500 MG TOTAL) BY MOUTH AT BEDTIME. 100 tablet 3  . omega-3 acid ethyl esters (LOVAZA) 1 g capsule TAKE 2 CAPSULES (2 G TOTAL) BY MOUTH 2 (TWO) TIMES DAILY. 360 capsule 3  . predniSONE (DELTASONE) 5 MG tablet Take 5 mg by mouth daily.    . methotrexate  50 MG/2ML injection INJECT 1 ML INTO THE MUSCLE ONCE WEEKLY 4 mL 0  . methotrexate 50 MG/2ML injection INJECT 1 ML INTO THE MUSCLE ONCE WEEKLY 28 4 mL 0  . methotrexate 50 MG/2ML injection INJECT 1 ML INTO THE MUSCLE ONCE WEEKLY 4 mL 0  . methotrexate 50 MG/2ML injection INJECT 1 ML INTO THE MUSCLE ONCE WEEKLY 30 days 4 mL 3  . predniSONE (DELTASONE) 5 MG tablet TAKE 1 TABLET BY MOUTH ONCE DAILY 30 tablet 0  . predniSONE (DELTASONE) 5 MG tablet TAKE 1 TABLET BY MOUTH ONCE DAILY 30 tablet 0  . predniSONE (DELTASONE) 5 MG tablet TAKE 1 TABLET BY MOUTH ONCE DAILY 30 tablet 0  . predniSONE (DELTASONE) 5 MG tablet TAKE 1 TABLET BY MOUTH ONCE DAILY 30 tablet 0  . predniSONE (DELTASONE) 5 MG tablet TAKE 1  TABLET BY MOUTH ONCE DAILY 30 tablet 0  . TUBERCULIN SYR 1CC/27GX1/2" (B-D TB SYRINGE 1CC/27GX1/2") 27G X 1/2" 1 ML MISC use to inject methotrexate once a week 4 each 11    Results for orders placed or performed during the hospital encounter of 02-25-2021 (from the past 48 hour(s))  Basic metabolic panel     Status: Abnormal   Collection Time: 02/15/21  9:30 AM  Result Value Ref Range   Sodium 135 135 - 145 mmol/L   Potassium 4.4 3.5 - 5.1 mmol/L   Chloride 104 98 - 111 mmol/L   CO2 24 22 - 32 mmol/L   Glucose, Bld 101 (H) 70 - 99 mg/dL    Comment: Glucose reference range applies only to samples taken after fasting for at least 8 hours.   BUN 28 (H) 8 - 23 mg/dL   Creatinine, Ser 1.07 0.61 - 1.24 mg/dL   Calcium 10.3 8.9 - 10.3 mg/dL   GFR, Estimated >60 >60 mL/min    Comment: (NOTE) Calculated using the CKD-EPI Creatinine Equation (2021)    Anion gap 7 5 - 15    Comment: Performed at [facility] [address], [place],  [address]   No results found.  Review of Systems no recent fever, chills, nausea, vomiting or changes in his appetite  Blood pressure 112/81, pulse 72, temperature 98 F (36.7 C), temperature source Oral, resp. rate 16, height 6' (1.829 m), weight 95 kg, SpO2 97 %. Physical Exam  Well-nourished well-developed man in no apparent distress.  Alert and oriented x4.  Mood and affect are normal.  Extraocular motions are intact.  Respirations are unlabored.  Gait is antalgic bilaterally.  There is a severe bunion deformity.  All 4 lesser toes have significant hammertoe deformities with crossover of several toes.  There cannot tender calluses beneath the distal metatarsals.  Skin is otherwise healthy and intact.  Heel cord is tight.  5 out of 5 strength in plantarflexion at the ankle.  Toes do not have any active range of motion.   Assessment/Plan Severe right rheumatoid forefoot deformity with tight heel cord -to the operating room today for transmetatarsal  amputation and heel cord lengthening.  The risks and benefits of the alternative treatment options have been discussed in detail.  The patient wishes to proceed with surgery and specifically understands risks of bleeding, infection, nerve damage, blood clots, need for additional surgery, amputation and death.   Wylene Simmer, MD 02/25/21, 7:19 AM

## 2021-02-17 NOTE — Anesthesia Preprocedure Evaluation (Signed)
Anesthesia Evaluation  Patient identified by MRN, date of birth, ID band Patient awake    Reviewed: Allergy & Precautions, NPO status , Patient's Chart, lab work & pertinent test results  Airway Mallampati: II  TM Distance: >3 FB Neck ROM: Full    Dental no notable dental hx. (+) Teeth Intact, Dental Advisory Given,    Pulmonary sleep apnea , former smoker,    Pulmonary exam normal        Cardiovascular Exercise Tolerance: Good hypertension, Pt. on medications negative cardio ROS   Rhythm:Regular Rate:Normal     Neuro/Psych negative neurological ROS  negative psych ROS   GI/Hepatic negative GI ROS, Neg liver ROS, GERD  Medicated,  Endo/Other  negative endocrine ROS  Renal/GU negative Renal ROS  negative genitourinary   Musculoskeletal negative musculoskeletal ROS (+) Arthritis , Rheumatoid disorders and steroids,    Abdominal   Peds negative pediatric ROS (+)  Hematology negative hematology ROS (+)   Anesthesia Other Findings   Reproductive/Obstetrics negative OB ROS                             Anesthesia Physical  Anesthesia Plan  ASA: II  Anesthesia Plan: General   Post-op Pain Management:  Regional for Post-op pain   Induction: Intravenous  PONV Risk Score and Plan: 2 and Treatment may vary due to age or medical condition, Ondansetron and Midazolam  Airway Management Planned: LMA  Additional Equipment:   Intra-op Plan:   Post-operative Plan: Extubation in OR  Informed Consent: I have reviewed the patients History and Physical, chart, labs and discussed the procedure including the risks, benefits and alternatives for the proposed anesthesia with the patient or authorized representative who has indicated his/her understanding and acceptance.     Dental advisory given  Plan Discussed with: CRNA  Anesthesia Plan Comments:         Anesthesia Quick  Evaluation

## 2021-02-17 NOTE — Progress Notes (Signed)
Assisted Dr. Sabra Heck with right, ultrasound guided, popliteal block. Side rails up, monitors on throughout procedure. See vital signs in flow sheet. Tolerated Procedure well.

## 2021-02-17 NOTE — Op Note (Signed)
02/17/2021  8:20 AM  PATIENT:  Todd Valdez  60 y.o. 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  Right percutaneous Achilles tendon lengthening 2.  Right transmetatarsal amputation  SURGEON:  Wylene Simmer, MD  ASSISTANT: Mechele Claude, PA-C  ANESTHESIA:   General, regional  EBL:  minimal   TOURNIQUET:   Total Tourniquet Time Documented: Thigh (Right) - 25 minutes Total: Thigh (Right) - 25 minutes  COMPLICATIONS:  None apparent  DISPOSITION:  Extubated, awake and stable to recovery.  INDICATION FOR PROCEDURE: The patient is a 60 year old male with a past medical history significant for rheumatoid arthritis complicated by severe right forefoot deformities.  He has had multiple previous forefoot surgeries including resection arthroplasties of the lesser toe MTP joints and a Keller resection arthroplasty of the hallux MP joint.  He has a tight heel cord as well as chronic pain related to his dislocated hallux MP joint and lesser toe hammertoe and crossover toe deformities.  He has failed nonoperative treatment to date and presents today for surgical correction.  There are no feasible reconstructive options, so he has elected transmetatarsal amputation and heel cord lengthening.  The risks and benefits of the alternative treatment options have been discussed in detail.  The patient wishes to proceed with surgery and specifically understands risks of bleeding, infection, nerve damage, blood clots, need for additional surgery, amputation and death.  PROCEDURE IN DETAIL:  After pre operative consent was obtained, and the correct operative site was identified, the patient was brought to the operating room and placed supine on the OR table.  Anesthesia was administered.  Pre-operative antibiotics were administered.  A surgical timeout was taken.  The right lower  extremity was prepped and draped in standard sterile fashion with a tourniquet around the thigh.  The extremity was elevated and the tourniquet was inflated to 250 mmHg.  A triple hemisection percutaneous heel cord lengthening was performed.  The ankle dorsiflexed 30 degrees with the knee extended.  A fishmouth incision was then marked around the forefoot just proximal from the MTP joints.  Sharp dissection was carried down through the skin and subcutaneous tissues.  Subperiosteal dissection was then carried along the metatarsal shafts.  An oscillating saw was used to cut through the first through fifth metatarsals beveling the cuts appropriately.  The plantar soft tissues were then elevated from the plantar metatarsals in subperiosteal fashion.  The forefoot was passed off the field.  Neurovascular bundles were cauterized.  The beveled cuts were smoothed.  The wound was irrigated copiously.  Vancomycin powder was sprinkled in the wound.  Skin incision was closed with simple and horizontal mattress sutures of 3-0 nylon.  Sterile dressings were applied followed by compression wrap and a cam boot.  The tourniquet was released after application of the dressings.  The patient was awakened from anesthesia and transported to the recovery room in stable condition.  FOLLOW UP PLAN: Heel weightbearing on the right lower extremity in a cam boot.  Follow-up in the office in 2 weeks for a wound check and possible suture removal.    Mechele Claude PA-C was present and scrubbed for the duration of the operative case. His assistance was essential in positioning the patient, prepping and draping, gaining and maintaining exposure, performing the operation, closing and dressing the wounds and applying the splint.

## 2021-02-17 NOTE — Transfer of Care (Signed)
Immediate Anesthesia Transfer of Care Note  Patient: Chancey Ringel  Procedure(s) Performed: Right transmetatarsal amputation and heelcord lengthening (Right Foot)  Patient Location: PACU  Anesthesia Type:GA combined with regional for post-op pain  Level of Consciousness: drowsy and patient cooperative  Airway & Oxygen Therapy: Patient Spontanous Breathing and Patient connected to face mask oxygen  Post-op Assessment: Report given to RN and Post -op Vital signs reviewed and stable  Post vital signs: Reviewed and stable  Last Vitals:  Vitals Value Taken Time  BP    Temp    Pulse 83 02/17/21 0822  Resp 16 02/17/21 0822  SpO2 95 % 02/17/21 0822  Vitals shown include unvalidated device data.  Last Pain:  Vitals:   02/17/21 0634  TempSrc: Oral  PainSc: 0-No pain      Patients Stated Pain Goal: 4 (24/23/53 6144)  Complications: No complications documented.

## 2021-02-17 NOTE — Anesthesia Procedure Notes (Signed)
Anesthesia Regional Block: Popliteal block   Pre-Anesthetic Checklist: ,, timeout performed, Correct Patient, Correct Site, Correct Laterality, Correct Procedure, Correct Position, site marked, Risks and benefits discussed,  Surgical consent,  Pre-op evaluation,  At surgeon's request and post-op pain management  Laterality: Right  Prep: chloraprep       Needles:  Injection technique: Single-shot  Needle Type: Stimiplex     Needle Length: 9cm  Needle Gauge: 21     Additional Needles:   Procedures:,,,, ultrasound used (permanent image in chart),,,,  Narrative:  Start time: 02/17/2021 7:13 AM End time: 02/17/2021 7:18 AM Injection made incrementally with aspirations every 5 mL.  Performed by: Personally  Anesthesiologist: Lynda Rainwater, MD

## 2021-02-17 NOTE — Anesthesia Postprocedure Evaluation (Signed)
Anesthesia Post Note  Patient: Gaege Sangalang  Procedure(s) Performed: Right transmetatarsal amputation and heelcord lengthening (Right Foot)     Patient location during evaluation: PACU Anesthesia Type: General Level of consciousness: awake and alert Pain management: pain level controlled Vital Signs Assessment: post-procedure vital signs reviewed and stable Respiratory status: spontaneous breathing, nonlabored ventilation and respiratory function stable Cardiovascular status: blood pressure returned to baseline and stable Postop Assessment: no apparent nausea or vomiting Anesthetic complications: no   No complications documented.  Last Vitals:  Vitals:   02/17/21 0845 02/17/21 0918  BP: 110/75 112/73  Pulse: 96 100  Resp: 17 16  Temp:  36.8 C  SpO2: 94% 95%    Last Pain:  Vitals:   02/17/21 0918  TempSrc:   PainSc: 0-No pain                 Lynda Rainwater

## 2021-02-18 ENCOUNTER — Other Ambulatory Visit (HOSPITAL_BASED_OUTPATIENT_CLINIC_OR_DEPARTMENT_OTHER): Payer: Self-pay

## 2021-02-18 ENCOUNTER — Encounter (HOSPITAL_BASED_OUTPATIENT_CLINIC_OR_DEPARTMENT_OTHER): Payer: Self-pay | Admitting: Orthopedic Surgery

## 2021-02-21 ENCOUNTER — Other Ambulatory Visit (HOSPITAL_BASED_OUTPATIENT_CLINIC_OR_DEPARTMENT_OTHER): Payer: Self-pay

## 2021-02-21 MED FILL — Esomeprazole Magnesium Cap Delayed Release 40 MG (Base Eq): ORAL | 60 days supply | Qty: 60 | Fill #0 | Status: CN

## 2021-02-23 ENCOUNTER — Other Ambulatory Visit (HOSPITAL_COMMUNITY): Payer: Self-pay

## 2021-02-28 ENCOUNTER — Other Ambulatory Visit (HOSPITAL_BASED_OUTPATIENT_CLINIC_OR_DEPARTMENT_OTHER): Payer: Self-pay

## 2021-03-04 ENCOUNTER — Other Ambulatory Visit (HOSPITAL_BASED_OUTPATIENT_CLINIC_OR_DEPARTMENT_OTHER): Payer: Self-pay

## 2021-03-04 DIAGNOSIS — M069 Rheumatoid arthritis, unspecified: Secondary | ICD-10-CM | POA: Diagnosis not present

## 2021-03-04 DIAGNOSIS — Z4889 Encounter for other specified surgical aftercare: Secondary | ICD-10-CM | POA: Diagnosis not present

## 2021-03-04 MED ORDER — IBUPROFEN 800 MG PO TABS
ORAL_TABLET | ORAL | 1 refills | Status: DC
  Filled 2021-03-04: qty 90, 30d supply, fill #0
  Filled 2021-04-05: qty 90, 30d supply, fill #1

## 2021-03-04 MED ORDER — HYDROCODONE-ACETAMINOPHEN 5-325 MG PO TABS
ORAL_TABLET | ORAL | 0 refills | Status: DC
  Filled 2021-03-04: qty 18, 3d supply, fill #0

## 2021-03-04 MED FILL — Esomeprazole Magnesium Cap Delayed Release 40 MG (Base Eq): ORAL | 75 days supply | Qty: 75 | Fill #0 | Status: AC

## 2021-03-10 ENCOUNTER — Other Ambulatory Visit (HOSPITAL_BASED_OUTPATIENT_CLINIC_OR_DEPARTMENT_OTHER): Payer: Self-pay

## 2021-03-10 DIAGNOSIS — H16002 Unspecified corneal ulcer, left eye: Secondary | ICD-10-CM | POA: Diagnosis not present

## 2021-03-10 DIAGNOSIS — M0589 Other rheumatoid arthritis with rheumatoid factor of multiple sites: Secondary | ICD-10-CM | POA: Diagnosis not present

## 2021-03-10 DIAGNOSIS — M79671 Pain in right foot: Secondary | ICD-10-CM | POA: Diagnosis not present

## 2021-03-10 DIAGNOSIS — H16003 Unspecified corneal ulcer, bilateral: Secondary | ICD-10-CM | POA: Diagnosis not present

## 2021-03-10 DIAGNOSIS — M150 Primary generalized (osteo)arthritis: Secondary | ICD-10-CM | POA: Diagnosis not present

## 2021-03-10 DIAGNOSIS — M3500 Sicca syndrome, unspecified: Secondary | ICD-10-CM | POA: Diagnosis not present

## 2021-03-10 MED ORDER — METHOTREXATE SODIUM CHEMO INJECTION 250 MG/10ML
INTRAMUSCULAR | 1 refills | Status: DC
  Filled 2021-03-10: qty 12, 90d supply, fill #0

## 2021-03-10 MED ORDER — FOLIC ACID 1 MG PO TABS
ORAL_TABLET | ORAL | 3 refills | Status: DC
  Filled 2021-03-10: qty 90, 90d supply, fill #0
  Filled 2021-06-19: qty 90, 90d supply, fill #1
  Filled 2021-09-25: qty 90, 90d supply, fill #2
  Filled 2021-12-19: qty 90, 90d supply, fill #3

## 2021-03-10 MED ORDER — "TUBERCULIN SYRINGE 27G X 1/2"" 1 ML MISC"
3 refills | Status: DC
  Filled 2021-03-10: qty 13, 90d supply, fill #0

## 2021-03-10 MED ORDER — PREDNISONE 5 MG PO TABS
ORAL_TABLET | ORAL | 1 refills | Status: DC
  Filled 2021-03-10 – 2021-03-14 (×3): qty 90, 90d supply, fill #0
  Filled 2021-06-19: qty 90, 90d supply, fill #1

## 2021-03-11 ENCOUNTER — Other Ambulatory Visit (HOSPITAL_BASED_OUTPATIENT_CLINIC_OR_DEPARTMENT_OTHER): Payer: Self-pay

## 2021-03-12 MED FILL — Atorvastatin Calcium Tab 40 MG (Base Equivalent): ORAL | 90 days supply | Qty: 90 | Fill #0 | Status: AC

## 2021-03-14 ENCOUNTER — Other Ambulatory Visit (HOSPITAL_BASED_OUTPATIENT_CLINIC_OR_DEPARTMENT_OTHER): Payer: Self-pay

## 2021-03-15 ENCOUNTER — Other Ambulatory Visit (HOSPITAL_COMMUNITY): Payer: Self-pay

## 2021-04-05 ENCOUNTER — Other Ambulatory Visit (HOSPITAL_BASED_OUTPATIENT_CLINIC_OR_DEPARTMENT_OTHER): Payer: Self-pay

## 2021-04-05 MED FILL — Doxycycline Monohydrate Tab 50 MG: ORAL | 60 days supply | Qty: 60 | Fill #1 | Status: AC

## 2021-04-18 ENCOUNTER — Other Ambulatory Visit (HOSPITAL_COMMUNITY): Payer: Self-pay

## 2021-04-21 ENCOUNTER — Other Ambulatory Visit (HOSPITAL_COMMUNITY): Payer: Self-pay

## 2021-04-25 ENCOUNTER — Other Ambulatory Visit (HOSPITAL_COMMUNITY): Payer: Self-pay

## 2021-04-25 MED FILL — Abatacept Subcutaneous Soln Auto-Injector 125 MG/ML: SUBCUTANEOUS | 28 days supply | Qty: 4 | Fill #1 | Status: CN

## 2021-04-28 ENCOUNTER — Other Ambulatory Visit (HOSPITAL_BASED_OUTPATIENT_CLINIC_OR_DEPARTMENT_OTHER): Payer: Self-pay

## 2021-04-28 MED FILL — Niacin Tab ER 500 MG: ORAL | 100 days supply | Qty: 100 | Fill #0 | Status: AC

## 2021-04-29 ENCOUNTER — Other Ambulatory Visit (HOSPITAL_BASED_OUTPATIENT_CLINIC_OR_DEPARTMENT_OTHER): Payer: Self-pay

## 2021-05-05 ENCOUNTER — Other Ambulatory Visit (HOSPITAL_COMMUNITY): Payer: Self-pay

## 2021-05-05 MED FILL — Abatacept Subcutaneous Soln Auto-Injector 125 MG/ML: SUBCUTANEOUS | 28 days supply | Qty: 4 | Fill #1 | Status: CN

## 2021-05-09 ENCOUNTER — Other Ambulatory Visit (HOSPITAL_COMMUNITY): Payer: Self-pay

## 2021-05-09 MED FILL — Abatacept Subcutaneous Soln Auto-Injector 125 MG/ML: SUBCUTANEOUS | 28 days supply | Qty: 4 | Fill #1 | Status: AC

## 2021-05-10 ENCOUNTER — Other Ambulatory Visit: Payer: Self-pay | Admitting: Sports Medicine

## 2021-05-10 ENCOUNTER — Other Ambulatory Visit (HOSPITAL_BASED_OUTPATIENT_CLINIC_OR_DEPARTMENT_OTHER): Payer: Self-pay

## 2021-05-10 DIAGNOSIS — E782 Mixed hyperlipidemia: Secondary | ICD-10-CM

## 2021-05-10 MED ORDER — ESOMEPRAZOLE MAGNESIUM 40 MG PO CPDR
DELAYED_RELEASE_CAPSULE | ORAL | 3 refills | Status: DC
  Filled 2021-05-10: qty 75, 75d supply, fill #0
  Filled 2021-07-21: qty 75, 75d supply, fill #1
  Filled 2021-09-25: qty 75, 75d supply, fill #2
  Filled 2021-12-07: qty 75, 75d supply, fill #3

## 2021-05-10 MED ORDER — FENOFIBRATE 160 MG PO TABS
ORAL_TABLET | Freq: Every day | ORAL | 0 refills | Status: DC
  Filled 2021-05-10: qty 90, 90d supply, fill #0

## 2021-05-18 DIAGNOSIS — Z89411 Acquired absence of right great toe: Secondary | ICD-10-CM | POA: Diagnosis not present

## 2021-05-18 DIAGNOSIS — Z89421 Acquired absence of other right toe(s): Secondary | ICD-10-CM | POA: Diagnosis not present

## 2021-05-23 ENCOUNTER — Other Ambulatory Visit: Payer: Self-pay | Admitting: Sports Medicine

## 2021-05-23 ENCOUNTER — Other Ambulatory Visit (HOSPITAL_BASED_OUTPATIENT_CLINIC_OR_DEPARTMENT_OTHER): Payer: Self-pay

## 2021-05-23 MED ORDER — LISINOPRIL-HYDROCHLOROTHIAZIDE 20-25 MG PO TABS
ORAL_TABLET | ORAL | 0 refills | Status: DC
  Filled 2021-05-23: qty 90, 90d supply, fill #0

## 2021-06-01 ENCOUNTER — Other Ambulatory Visit (HOSPITAL_COMMUNITY): Payer: Self-pay

## 2021-06-03 ENCOUNTER — Other Ambulatory Visit (HOSPITAL_COMMUNITY): Payer: Self-pay

## 2021-06-10 DIAGNOSIS — H16003 Unspecified corneal ulcer, bilateral: Secondary | ICD-10-CM | POA: Diagnosis not present

## 2021-06-10 DIAGNOSIS — M3500 Sicca syndrome, unspecified: Secondary | ICD-10-CM | POA: Diagnosis not present

## 2021-06-10 DIAGNOSIS — Z6828 Body mass index (BMI) 28.0-28.9, adult: Secondary | ICD-10-CM | POA: Diagnosis not present

## 2021-06-10 DIAGNOSIS — H16002 Unspecified corneal ulcer, left eye: Secondary | ICD-10-CM | POA: Diagnosis not present

## 2021-06-10 DIAGNOSIS — M79671 Pain in right foot: Secondary | ICD-10-CM | POA: Diagnosis not present

## 2021-06-10 DIAGNOSIS — M150 Primary generalized (osteo)arthritis: Secondary | ICD-10-CM | POA: Diagnosis not present

## 2021-06-10 DIAGNOSIS — M0589 Other rheumatoid arthritis with rheumatoid factor of multiple sites: Secondary | ICD-10-CM | POA: Diagnosis not present

## 2021-06-10 DIAGNOSIS — E663 Overweight: Secondary | ICD-10-CM | POA: Diagnosis not present

## 2021-06-16 ENCOUNTER — Other Ambulatory Visit (HOSPITAL_COMMUNITY): Payer: Self-pay

## 2021-06-16 MED FILL — Abatacept Subcutaneous Soln Auto-Injector 125 MG/ML: SUBCUTANEOUS | 28 days supply | Qty: 4 | Fill #2 | Status: AC

## 2021-06-19 ENCOUNTER — Other Ambulatory Visit: Payer: Self-pay

## 2021-06-19 MED FILL — Atorvastatin Calcium Tab 40 MG (Base Equivalent): ORAL | 90 days supply | Qty: 90 | Fill #1 | Status: AC

## 2021-06-19 MED FILL — Omega-3-acid Ethyl Esters Cap 1 GM: ORAL | 90 days supply | Qty: 360 | Fill #0 | Status: AC

## 2021-06-20 ENCOUNTER — Other Ambulatory Visit (HOSPITAL_BASED_OUTPATIENT_CLINIC_OR_DEPARTMENT_OTHER): Payer: Self-pay

## 2021-06-20 ENCOUNTER — Other Ambulatory Visit (HOSPITAL_COMMUNITY): Payer: Self-pay

## 2021-06-21 ENCOUNTER — Other Ambulatory Visit (HOSPITAL_BASED_OUTPATIENT_CLINIC_OR_DEPARTMENT_OTHER): Payer: Self-pay

## 2021-06-24 ENCOUNTER — Other Ambulatory Visit (HOSPITAL_BASED_OUTPATIENT_CLINIC_OR_DEPARTMENT_OTHER): Payer: Self-pay

## 2021-06-24 MED ORDER — DOXYCYCLINE MONOHYDRATE 50 MG PO TABS
25.0000 mg | ORAL_TABLET | Freq: Two times a day (BID) | ORAL | 1 refills | Status: DC
  Filled 2021-06-24 – 2021-07-15 (×3): qty 60, 60d supply, fill #0
  Filled 2021-09-05: qty 60, 60d supply, fill #1
  Filled 2021-09-07: qty 60, 60d supply, fill #0

## 2021-07-01 ENCOUNTER — Other Ambulatory Visit (HOSPITAL_BASED_OUTPATIENT_CLINIC_OR_DEPARTMENT_OTHER): Payer: Self-pay

## 2021-07-15 ENCOUNTER — Other Ambulatory Visit (HOSPITAL_COMMUNITY): Payer: Self-pay

## 2021-07-15 ENCOUNTER — Other Ambulatory Visit (HOSPITAL_BASED_OUTPATIENT_CLINIC_OR_DEPARTMENT_OTHER): Payer: Self-pay

## 2021-07-15 ENCOUNTER — Ambulatory Visit: Payer: Medicare Other | Attending: Internal Medicine

## 2021-07-15 DIAGNOSIS — Z23 Encounter for immunization: Secondary | ICD-10-CM

## 2021-07-15 MED FILL — Abatacept Subcutaneous Soln Auto-Injector 125 MG/ML: SUBCUTANEOUS | 28 days supply | Qty: 4 | Fill #3 | Status: AC

## 2021-07-15 NOTE — Progress Notes (Signed)
   Covid-19 Vaccination Clinic  Name:  Ayomide Purdy    MRN: [id number] DOB: 05-08-1961  07/15/2021  Mr. Faciane was observed post Covid-19 immunization for 15 minutes without incident. He was provided with Vaccine Information Sheet and instruction to access the V-Safe system.   Mr. Laventure was instructed to call [phone] with any severe reactions post vaccine: Difficulty breathing  Swelling of face and throat  A fast heartbeat  A bad rash all over body  Dizziness and weakness

## 2021-07-18 DIAGNOSIS — H18463 Peripheral corneal degeneration, bilateral: Secondary | ICD-10-CM | POA: Diagnosis not present

## 2021-07-18 DIAGNOSIS — H179 Unspecified corneal scar and opacity: Secondary | ICD-10-CM | POA: Diagnosis not present

## 2021-07-18 DIAGNOSIS — M069 Rheumatoid arthritis, unspecified: Secondary | ICD-10-CM | POA: Diagnosis not present

## 2021-07-20 ENCOUNTER — Other Ambulatory Visit (HOSPITAL_COMMUNITY): Payer: Self-pay

## 2021-07-21 ENCOUNTER — Other Ambulatory Visit (HOSPITAL_BASED_OUTPATIENT_CLINIC_OR_DEPARTMENT_OTHER): Payer: Self-pay

## 2021-07-26 ENCOUNTER — Other Ambulatory Visit (HOSPITAL_BASED_OUTPATIENT_CLINIC_OR_DEPARTMENT_OTHER): Payer: Self-pay

## 2021-07-26 MED ORDER — COVID-19MRNA BIVAL VACC PFIZER 30 MCG/0.3ML IM SUSP
INTRAMUSCULAR | 0 refills | Status: DC
  Filled 2021-07-26: qty 0.3, 1d supply, fill #0

## 2021-08-12 ENCOUNTER — Other Ambulatory Visit (HOSPITAL_COMMUNITY): Payer: Self-pay

## 2021-08-15 ENCOUNTER — Other Ambulatory Visit (HOSPITAL_BASED_OUTPATIENT_CLINIC_OR_DEPARTMENT_OTHER): Payer: Self-pay

## 2021-08-15 ENCOUNTER — Other Ambulatory Visit: Payer: Self-pay | Admitting: Sports Medicine

## 2021-08-15 DIAGNOSIS — E782 Mixed hyperlipidemia: Secondary | ICD-10-CM

## 2021-08-15 MED ORDER — FENOFIBRATE 160 MG PO TABS
ORAL_TABLET | Freq: Every day | ORAL | 0 refills | Status: DC
  Filled 2021-08-15: qty 90, 90d supply, fill #0

## 2021-08-15 MED ORDER — NIACIN ER 500 MG PO TBCR
EXTENDED_RELEASE_TABLET | ORAL | 0 refills | Status: DC
  Filled 2021-08-15: qty 100, 100d supply, fill #0

## 2021-08-15 MED ORDER — LISINOPRIL-HYDROCHLOROTHIAZIDE 20-25 MG PO TABS
ORAL_TABLET | ORAL | 0 refills | Status: DC
  Filled 2021-08-15: qty 90, 90d supply, fill #0

## 2021-08-16 ENCOUNTER — Other Ambulatory Visit (HOSPITAL_BASED_OUTPATIENT_CLINIC_OR_DEPARTMENT_OTHER): Payer: Self-pay

## 2021-08-23 ENCOUNTER — Other Ambulatory Visit (HOSPITAL_COMMUNITY): Payer: Self-pay

## 2021-08-25 ENCOUNTER — Other Ambulatory Visit (HOSPITAL_COMMUNITY): Payer: Self-pay

## 2021-08-26 ENCOUNTER — Other Ambulatory Visit: Payer: Self-pay | Admitting: Pharmacist

## 2021-08-26 ENCOUNTER — Other Ambulatory Visit (HOSPITAL_COMMUNITY): Payer: Self-pay

## 2021-08-26 MED ORDER — ORENCIA CLICKJECT 125 MG/ML ~~LOC~~ SOAJ: SUBCUTANEOUS | 4 refills | Status: DC

## 2021-08-26 MED ORDER — ORENCIA CLICKJECT 125 MG/ML ~~LOC~~ SOAJ
SUBCUTANEOUS | 4 refills | Status: DC
  Filled 2021-08-26: qty 4, 28d supply, fill #0
  Filled 2021-10-06: qty 4, 28d supply, fill #1
  Filled 2021-11-02: qty 4, 28d supply, fill #2
  Filled 2021-12-06: qty 4, 28d supply, fill #3
  Filled 2022-01-12: qty 4, 28d supply, fill #4

## 2021-09-05 ENCOUNTER — Other Ambulatory Visit (HOSPITAL_COMMUNITY): Payer: Self-pay

## 2021-09-05 ENCOUNTER — Other Ambulatory Visit (HOSPITAL_BASED_OUTPATIENT_CLINIC_OR_DEPARTMENT_OTHER): Payer: Self-pay

## 2021-09-06 ENCOUNTER — Other Ambulatory Visit (HOSPITAL_COMMUNITY): Payer: Self-pay

## 2021-09-07 ENCOUNTER — Other Ambulatory Visit (HOSPITAL_BASED_OUTPATIENT_CLINIC_OR_DEPARTMENT_OTHER): Payer: Self-pay

## 2021-09-07 ENCOUNTER — Other Ambulatory Visit (HOSPITAL_COMMUNITY): Payer: Self-pay

## 2021-09-09 DIAGNOSIS — M0589 Other rheumatoid arthritis with rheumatoid factor of multiple sites: Secondary | ICD-10-CM | POA: Diagnosis not present

## 2021-09-23 ENCOUNTER — Other Ambulatory Visit (HOSPITAL_COMMUNITY): Payer: Self-pay

## 2021-09-25 ENCOUNTER — Other Ambulatory Visit (HOSPITAL_BASED_OUTPATIENT_CLINIC_OR_DEPARTMENT_OTHER): Payer: Self-pay

## 2021-09-25 MED FILL — Atorvastatin Calcium Tab 40 MG (Base Equivalent): ORAL | 90 days supply | Qty: 90 | Fill #2 | Status: AC

## 2021-09-26 ENCOUNTER — Other Ambulatory Visit (HOSPITAL_BASED_OUTPATIENT_CLINIC_OR_DEPARTMENT_OTHER): Payer: Self-pay

## 2021-09-26 MED ORDER — PREDNISONE 5 MG PO TABS
5.0000 mg | ORAL_TABLET | Freq: Every day | ORAL | 0 refills | Status: DC
  Filled 2021-09-26: qty 90, 90d supply, fill #0

## 2021-09-27 ENCOUNTER — Other Ambulatory Visit (HOSPITAL_COMMUNITY): Payer: Self-pay

## 2021-09-28 ENCOUNTER — Other Ambulatory Visit: Payer: Self-pay

## 2021-09-28 ENCOUNTER — Ambulatory Visit: Payer: Medicare Other | Attending: Sports Medicine | Admitting: Pharmacist

## 2021-09-28 DIAGNOSIS — Z79899 Other long term (current) drug therapy: Secondary | ICD-10-CM

## 2021-09-28 NOTE — Progress Notes (Signed)
°  S: Patient presents for review of their specialty medication therapy.  Patient is currently taking Orencia for rheumatoid arthritis. Patient is managed by Dr. Amil Amen for this.   Adherence: denies any missed doses.   Efficacy: reports that it is still working very well for him  FDA-approved dosing: 750 mg every 4 weeks (gets infusion at [facility]).  Drug-drug interactions: none  Screenings: TB screening: completed per patient Hepatitis Screening: completed per patient Blood glucose: Orencia contains maltose which make falsely elevate glucose levels  Monitoring: S/sx of infection: denies.  S/sx of hypersensitivity: denies  Other adverse effects: denies  O:  Lab Results  Component Value Date   WBC 9.2 11/17/2020   HGB 14.8 11/17/2020   HCT 42.7 11/17/2020   MCV 92.8 11/17/2020   PLT 279 11/17/2020      Chemistry      Component Value Date/Time   NA 135 02/15/2021 0930   K 4.4 02/15/2021 0930   CL 104 02/15/2021 0930   CO2 24 02/15/2021 0930   BUN 28 (H) 02/15/2021 0930   CREATININE 1.07 02/15/2021 0930   CREATININE 1.03 11/17/2020 0809      Component Value Date/Time   CALCIUM 10.3 02/15/2021 0930   ALKPHOS 78 09/25/2016 0530   AST 21 11/17/2020 0809   ALT 23 11/17/2020 0809   BILITOT 0.7 11/17/2020 0809       A/P: 1. Medication review: Patient currently on [place] for the treatment of rheumatoid arthritis and is tolerating it well with continued improved control of RA. Reviewed the medication with the patient, including the following: Orencia is a selective T-cell costimulation blocker indicated for rheumatoid arthritis. The most common adverse effects are infections, headache, and injection site reactions. There is a possible adverse effect of increased risk of malignancy but it is not fully understood if this is due to the drug or the disease state itself. The patient was instructed to avoid use of live vaccinations without the approval of a  physician. No recommendations for any changes.  Benard Halsted, PharmD, [facility], [place] [phone]

## 2021-09-29 ENCOUNTER — Other Ambulatory Visit (HOSPITAL_COMMUNITY): Payer: Self-pay

## 2021-10-05 ENCOUNTER — Other Ambulatory Visit (HOSPITAL_COMMUNITY): Payer: Self-pay

## 2021-10-06 ENCOUNTER — Other Ambulatory Visit (HOSPITAL_COMMUNITY): Payer: Self-pay

## 2021-10-12 ENCOUNTER — Other Ambulatory Visit (HOSPITAL_COMMUNITY): Payer: Self-pay

## 2021-10-19 ENCOUNTER — Other Ambulatory Visit: Payer: Self-pay

## 2021-10-19 ENCOUNTER — Ambulatory Visit (INDEPENDENT_AMBULATORY_CARE_PROVIDER_SITE_OTHER): Payer: Medicare Other | Admitting: Sports Medicine

## 2021-10-19 VITALS — BP 124/81 | HR 69 | Wt 205.0 lb

## 2021-10-19 DIAGNOSIS — Z0000 Encounter for general adult medical examination without abnormal findings: Secondary | ICD-10-CM

## 2021-10-19 DIAGNOSIS — R739 Hyperglycemia, unspecified: Secondary | ICD-10-CM

## 2021-10-19 DIAGNOSIS — N139 Obstructive and reflux uropathy, unspecified: Secondary | ICD-10-CM

## 2021-10-19 DIAGNOSIS — E782 Mixed hyperlipidemia: Secondary | ICD-10-CM | POA: Diagnosis not present

## 2021-10-19 DIAGNOSIS — Z7952 Long term (current) use of systemic steroids: Secondary | ICD-10-CM | POA: Diagnosis not present

## 2021-10-19 NOTE — Progress Notes (Addendum)
Subjective:    CC: Annual Physical Exam  HPI:  This patient is here for their annual physical  I reviewed the past medical history, family history, social history, surgical history, and allergies today and no changes were needed.  Please see the problem list section below in epic for further details.  Past Medical History: Past Medical History:  Diagnosis Date   Arthritis    Cancer ([place])    Melanoma on  back   Diverticulitis    GERD (gastroesophageal reflux disease)    High triglycerides    History of abscess of skin and subcutaneous tissue    right forearm, chin   Hypertension    RA (rheumatoid arthritis) (HCC)    Reflux esophagitis    Right inguinal hernia    Sleep apnea    no cpap   Past Surgical History: Past Surgical History:  Procedure Laterality Date   bilateral feet reconstruction  10/2000   COLONOSCOPY     feet surgery     INGUINAL HERNIA REPAIR Right 11/27/2017   Procedure: OPEN RIGHT INGUINAL HERNIA REPAIR;  Surgeon: Armandina Gemma, MD;  Location: [place];  Service: General;  Laterality: Right;   INSERTION OF MESH Right 11/27/2017   Procedure: INSERTION OF MESH;  Surgeon: Armandina Gemma, MD;  Location: [place];  Service: General;  Laterality: Right;   MELANOMA EXCISION     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   Procedure: Right transmetatarsal amputation and heelcord lengthening;  Surgeon: Wylene Simmer, MD;  Location: [place];  Service: Orthopedics;  Laterality: Right;   UPPER GI ENDOSCOPY  12/2011   Social History: Social History   Socioeconomic History   Marital status: Married    Spouse name: Not on file   Number of children: Not on file   Years of education: Not on file   Highest education level: Not on file  Occupational History   Not on file  Tobacco Use   Smoking status: Former    Types: Cigarettes    Quit date: 02/07/1991    Years since quitting: 30.7   Smokeless tobacco: Former     Quit date: 08/14/2009  Vaping Use   Vaping Use: Never used  Substance and Sexual Activity   Alcohol use: No   Drug use: No   Sexual activity: Yes    Partners: Female    Comment: married.   Other Topics Concern   Not on file  Social History Narrative   Not on file   Social Determinants of Health   Financial Resource Strain: Not on file  Food Insecurity: Not on file  Transportation Needs: Not on file  Physical Activity: Not on file  Stress: Not on file  Social Connections: Not on file   Family History: Family History  Problem Relation Age of Onset   Hypertension Mother    Hypertension Father    Allergies: No Known Allergies Medications: See med rec.  Review of Systems: No headache, visual changes, nausea, vomiting, diarrhea, constipation, dizziness, abdominal pain, skin rash, fevers, chills, night sweats, swollen lymph nodes, weight loss, chest pain, body aches, joint swelling, muscle aches, shortness of breath, mood changes, visual or auditory hallucinations.  Objective:    General: Well Developed, well nourished, and in no acute distress.  Neuro: Alert and oriented x3, extra-ocular muscles intact, sensation grossly intact. Cranial nerves II through XII are intact, motor, sensory, and coordinative functions are all intact. HEENT: Normocephalic, atraumatic, pupils equal round reactive to light, neck supple,  no masses, no lymphadenopathy, thyroid nonpalpable. Oropharynx, nasopharynx, external ear canals are unremarkable. Skin: Warm and dry, no rashes noted.  Cardiac: Regular rate and rhythm, no murmurs rubs or gallops.  Respiratory: Clear to auscultation bilaterally. Not using accessory muscles, speaking in full sentences.  Abdominal: Soft, nontender, nondistended, positive bowel sounds, no masses, no organomegaly.  Musculoskeletal: Typical findings of rheumatoid arthritis in the hands and wrists.  Impression and Recommendations:    The patient was counselled, risk factors  were discussed, anticipatory guidance given.  Annual physical exam Valerian returns, he is a pleasant 61 year old male, we did an annual physical today, declines flu and Shingrix vaccinations, he is up-to-date on other screening measures including colonoscopy which was done early 2022. Sounds like he was given a 5-year recall for repeat.  Hypercalcemia Calcium is running high again, previous ionized calcium and PTH levels were normal. On further questioning he has been getting calcium supplementation, he will discontinue this for now.   ___________________________________________ Gwen Her. Dianah Field, M.D., ABFM., CAQSM. Primary Care and Sports Medicine [facility]  Adjunct Professor of [place] of [employer]

## 2021-10-19 NOTE — Assessment & Plan Note (Signed)
Todd Valdez returns, he is a pleasant 61 year old male, we did an annual physical today, declines flu and Shingrix vaccinations, he is up-to-date on other screening measures including colonoscopy which was done early 2022. Sounds like he was given a 5-year recall for repeat.

## 2021-10-20 NOTE — Assessment & Plan Note (Signed)
Calcium is running high again, previous ionized calcium and PTH levels were normal. On further questioning he has been getting calcium supplementation, he will discontinue this for now.

## 2021-10-21 LAB — COMPREHENSIVE METABOLIC PANEL
AG Ratio: 1.9 (calc) (ref 1.0–2.5)
ALT: 10 U/L (ref 9–46)
AST: 14 U/L (ref 10–35)
Albumin: 4.6 g/dL (ref 3.6–5.1)
Alkaline phosphatase (APISO): 65 U/L (ref 35–144)
BUN/Creatinine Ratio: 24 (calc) — ABNORMAL HIGH (ref 6–22)
BUN: 26 mg/dL — ABNORMAL HIGH (ref 7–25)
CO2: 25 mmol/L (ref 20–32)
Calcium: 10.9 mg/dL — ABNORMAL HIGH (ref 8.6–10.3)
Chloride: 106 mmol/L (ref 98–110)
Creat: 1.07 mg/dL (ref 0.70–1.35)
Globulin: 2.4 g/dL (calc) (ref 1.9–3.7)
Glucose, Bld: 94 mg/dL (ref 65–99)
Potassium: 4.5 mmol/L (ref 3.5–5.3)
Sodium: 139 mmol/L (ref 135–146)
Total Bilirubin: 0.6 mg/dL (ref 0.2–1.2)
Total Protein: 7 g/dL (ref 6.1–8.1)

## 2021-10-21 LAB — LIPID PANEL
Cholesterol: 69 mg/dL (ref <200)
HDL: 38 mg/dL — ABNORMAL LOW (ref >=40)
LDL Cholesterol (Calc): <10 mg/dL (calc)
Non-HDL Cholesterol (Calc): 31 mg/dL (calc) (ref <130)
Total CHOL/HDL Ratio: 1.8 (calc) (ref <5.0)
Triglycerides: 149 mg/dL (ref <150)

## 2021-10-21 LAB — CBC
HCT: 42 % (ref 38.5–50.0)
Hemoglobin: 14.3 g/dL (ref 13.2–17.1)
MCH: 32.1 pg (ref 27.0–33.0)
MCHC: 34 g/dL (ref 32.0–36.0)
MCV: 94.2 fL (ref 80.0–100.0)
MPV: 11.3 fL (ref 7.5–12.5)
Platelets: 274 10*3/uL (ref 140–400)
RBC: 4.46 10*6/uL (ref 4.20–5.80)
RDW: 12 % (ref 11.0–15.0)
WBC: 10.5 10*3/uL (ref 3.8–10.8)

## 2021-10-21 LAB — PSA, TOTAL AND FREE
PSA, % Free: 23 % (calc) — ABNORMAL LOW (ref >25)
PSA, Free: 0.7 ng/mL
PSA, Total: 3 ng/mL (ref <=4.0)

## 2021-10-21 LAB — HEMOGLOBIN A1C
Hgb A1c MFr Bld: 5.1 % of total Hgb (ref <5.7)
Mean Plasma Glucose: 100 mg/dL
eAG (mmol/L): 5.5 mmol/L

## 2021-10-21 LAB — TSH: TSH: 1.5 mIU/L (ref 0.40–4.50)

## 2021-11-02 ENCOUNTER — Other Ambulatory Visit (HOSPITAL_COMMUNITY): Payer: Self-pay

## 2021-11-10 ENCOUNTER — Other Ambulatory Visit (HOSPITAL_COMMUNITY): Payer: Self-pay

## 2021-11-14 ENCOUNTER — Other Ambulatory Visit (HOSPITAL_BASED_OUTPATIENT_CLINIC_OR_DEPARTMENT_OTHER): Payer: Self-pay

## 2021-11-14 ENCOUNTER — Other Ambulatory Visit: Payer: Self-pay | Admitting: Sports Medicine

## 2021-11-14 DIAGNOSIS — E782 Mixed hyperlipidemia: Secondary | ICD-10-CM

## 2021-11-14 MED ORDER — FENOFIBRATE 160 MG PO TABS
ORAL_TABLET | Freq: Every day | ORAL | 0 refills | Status: DC
  Filled 2021-11-14: qty 90, 90d supply, fill #0

## 2021-11-14 MED ORDER — NIACIN ER 500 MG PO TBCR
EXTENDED_RELEASE_TABLET | ORAL | 0 refills | Status: DC
  Filled 2021-11-14: qty 100, 100d supply, fill #0

## 2021-11-14 MED FILL — Omega-3-acid Ethyl Esters Cap 1 GM: ORAL | 90 days supply | Qty: 360 | Fill #1 | Status: AC

## 2021-11-15 ENCOUNTER — Other Ambulatory Visit (HOSPITAL_BASED_OUTPATIENT_CLINIC_OR_DEPARTMENT_OTHER): Payer: Self-pay

## 2021-11-15 MED ORDER — DOXYCYCLINE MONOHYDRATE 50 MG PO TABS
50.0000 mg | ORAL_TABLET | Freq: Every day | ORAL | 1 refills | Status: DC
  Filled 2021-11-15: qty 60, 60d supply, fill #0
  Filled 2022-01-16: qty 60, 60d supply, fill #1

## 2021-11-21 ENCOUNTER — Other Ambulatory Visit (HOSPITAL_BASED_OUTPATIENT_CLINIC_OR_DEPARTMENT_OTHER): Payer: Self-pay

## 2021-12-06 ENCOUNTER — Other Ambulatory Visit (HOSPITAL_COMMUNITY): Payer: Self-pay

## 2021-12-07 ENCOUNTER — Other Ambulatory Visit (HOSPITAL_BASED_OUTPATIENT_CLINIC_OR_DEPARTMENT_OTHER): Payer: Self-pay

## 2021-12-07 MED ORDER — METHOTREXATE SODIUM CHEMO INJECTION 50 MG/2ML
25.0000 mg | INTRAMUSCULAR | 0 refills | Status: DC
  Filled 2021-12-07: qty 12, 84d supply, fill #0

## 2021-12-08 DIAGNOSIS — M3500 Sicca syndrome, unspecified: Secondary | ICD-10-CM | POA: Diagnosis not present

## 2021-12-08 DIAGNOSIS — M79671 Pain in right foot: Secondary | ICD-10-CM | POA: Diagnosis not present

## 2021-12-08 DIAGNOSIS — M150 Primary generalized (osteo)arthritis: Secondary | ICD-10-CM | POA: Diagnosis not present

## 2021-12-08 DIAGNOSIS — Z6828 Body mass index (BMI) 28.0-28.9, adult: Secondary | ICD-10-CM | POA: Diagnosis not present

## 2021-12-08 DIAGNOSIS — M0589 Other rheumatoid arthritis with rheumatoid factor of multiple sites: Secondary | ICD-10-CM | POA: Diagnosis not present

## 2021-12-08 DIAGNOSIS — E663 Overweight: Secondary | ICD-10-CM | POA: Diagnosis not present

## 2021-12-08 DIAGNOSIS — H16002 Unspecified corneal ulcer, left eye: Secondary | ICD-10-CM | POA: Diagnosis not present

## 2021-12-08 DIAGNOSIS — H16003 Unspecified corneal ulcer, bilateral: Secondary | ICD-10-CM | POA: Diagnosis not present

## 2021-12-19 ENCOUNTER — Other Ambulatory Visit (HOSPITAL_BASED_OUTPATIENT_CLINIC_OR_DEPARTMENT_OTHER): Payer: Self-pay

## 2021-12-19 ENCOUNTER — Other Ambulatory Visit (HOSPITAL_COMMUNITY): Payer: Self-pay

## 2021-12-19 ENCOUNTER — Other Ambulatory Visit: Payer: Self-pay | Admitting: Sports Medicine

## 2021-12-19 MED ORDER — LISINOPRIL-HYDROCHLOROTHIAZIDE 20-25 MG PO TABS
ORAL_TABLET | ORAL | 1 refills | Status: DC
  Filled 2021-12-19: qty 90, 90d supply, fill #0
  Filled 2022-03-16: qty 90, 90d supply, fill #1

## 2021-12-19 MED ORDER — PREDNISONE 5 MG PO TABS
5.0000 mg | ORAL_TABLET | Freq: Every day | ORAL | 0 refills | Status: DC
  Filled 2021-12-19: qty 90, 90d supply, fill #0

## 2022-01-10 ENCOUNTER — Other Ambulatory Visit (HOSPITAL_COMMUNITY): Payer: Self-pay

## 2022-01-12 ENCOUNTER — Other Ambulatory Visit (HOSPITAL_COMMUNITY): Payer: Self-pay

## 2022-01-16 ENCOUNTER — Other Ambulatory Visit (HOSPITAL_BASED_OUTPATIENT_CLINIC_OR_DEPARTMENT_OTHER): Payer: Self-pay

## 2022-01-17 ENCOUNTER — Other Ambulatory Visit (HOSPITAL_COMMUNITY): Payer: Self-pay

## 2022-01-18 ENCOUNTER — Other Ambulatory Visit (HOSPITAL_COMMUNITY): Payer: Self-pay

## 2022-01-23 DIAGNOSIS — H18463 Peripheral corneal degeneration, bilateral: Secondary | ICD-10-CM | POA: Diagnosis not present

## 2022-01-23 DIAGNOSIS — H16403 Unspecified corneal neovascularization, bilateral: Secondary | ICD-10-CM | POA: Diagnosis not present

## 2022-01-23 DIAGNOSIS — H179 Unspecified corneal scar and opacity: Secondary | ICD-10-CM | POA: Diagnosis not present

## 2022-01-23 DIAGNOSIS — M069 Rheumatoid arthritis, unspecified: Secondary | ICD-10-CM | POA: Diagnosis not present

## 2022-02-06 ENCOUNTER — Other Ambulatory Visit (HOSPITAL_COMMUNITY): Payer: Self-pay

## 2022-02-06 ENCOUNTER — Other Ambulatory Visit: Payer: Self-pay | Admitting: Pharmacist

## 2022-02-06 MED ORDER — ORENCIA CLICKJECT 125 MG/ML ~~LOC~~ SOAJ
SUBCUTANEOUS | 4 refills | Status: DC
  Filled 2022-02-06: qty 4, 28d supply, fill #0
  Filled 2022-03-16: qty 4, 28d supply, fill #1
  Filled 2022-04-28: qty 4, 28d supply, fill #2
  Filled 2022-06-07: qty 4, 28d supply, fill #3
  Filled 2022-07-04: qty 4, 28d supply, fill #4

## 2022-02-06 MED ORDER — ORENCIA CLICKJECT 125 MG/ML ~~LOC~~ SOAJ: SUBCUTANEOUS | 4 refills | Status: DC

## 2022-02-07 ENCOUNTER — Other Ambulatory Visit (HOSPITAL_COMMUNITY): Payer: Self-pay

## 2022-02-08 ENCOUNTER — Other Ambulatory Visit (HOSPITAL_BASED_OUTPATIENT_CLINIC_OR_DEPARTMENT_OTHER): Payer: Self-pay

## 2022-02-08 ENCOUNTER — Other Ambulatory Visit: Payer: Self-pay | Admitting: Sports Medicine

## 2022-02-08 MED ORDER — ESOMEPRAZOLE MAGNESIUM 40 MG PO CPDR
DELAYED_RELEASE_CAPSULE | ORAL | 3 refills | Status: DC
  Filled 2022-02-08: qty 60, 60d supply, fill #0
  Filled 2022-02-13: qty 10, 10d supply, fill #0
  Filled 2022-02-13 – 2022-02-16 (×2): qty 75, 75d supply, fill #0
  Filled 2022-04-27: qty 75, 75d supply, fill #1
  Filled 2022-07-05 – 2022-07-06 (×2): qty 75, 75d supply, fill #2
  Filled 2022-09-04 – 2022-09-06 (×2): qty 75, 75d supply, fill #3
  Filled 2022-09-06: qty 30, 30d supply, fill #3
  Filled 2022-09-19: qty 45, 45d supply, fill #4
  Filled ????-??-??: fill #0

## 2022-02-09 ENCOUNTER — Other Ambulatory Visit (HOSPITAL_BASED_OUTPATIENT_CLINIC_OR_DEPARTMENT_OTHER): Payer: Self-pay

## 2022-02-09 ENCOUNTER — Other Ambulatory Visit: Payer: Self-pay | Admitting: Sports Medicine

## 2022-02-09 DIAGNOSIS — E782 Mixed hyperlipidemia: Secondary | ICD-10-CM

## 2022-02-09 MED ORDER — FENOFIBRATE 160 MG PO TABS
160.0000 mg | ORAL_TABLET | Freq: Every day | ORAL | 3 refills | Status: DC
  Filled 2022-02-09: qty 90, 90d supply, fill #0
  Filled 2022-05-29: qty 90, 90d supply, fill #1
  Filled 2022-08-22: qty 90, 90d supply, fill #2
  Filled 2022-11-22: qty 90, 90d supply, fill #3

## 2022-02-13 ENCOUNTER — Other Ambulatory Visit (HOSPITAL_BASED_OUTPATIENT_CLINIC_OR_DEPARTMENT_OTHER): Payer: Self-pay

## 2022-02-16 ENCOUNTER — Other Ambulatory Visit (HOSPITAL_BASED_OUTPATIENT_CLINIC_OR_DEPARTMENT_OTHER): Payer: Self-pay

## 2022-03-02 ENCOUNTER — Other Ambulatory Visit (HOSPITAL_COMMUNITY): Payer: Self-pay

## 2022-03-06 ENCOUNTER — Other Ambulatory Visit: Payer: Self-pay | Admitting: Sports Medicine

## 2022-03-06 DIAGNOSIS — E782 Mixed hyperlipidemia: Secondary | ICD-10-CM

## 2022-03-07 ENCOUNTER — Other Ambulatory Visit (HOSPITAL_BASED_OUTPATIENT_CLINIC_OR_DEPARTMENT_OTHER): Payer: Self-pay

## 2022-03-07 MED ORDER — NIACIN ER 500 MG PO TBCR
500.0000 mg | EXTENDED_RELEASE_TABLET | Freq: Every day | ORAL | 1 refills | Status: DC
  Filled 2022-03-07: qty 100, 100d supply, fill #0
  Filled 2022-06-12: qty 100, 100d supply, fill #1

## 2022-03-08 ENCOUNTER — Other Ambulatory Visit (HOSPITAL_BASED_OUTPATIENT_CLINIC_OR_DEPARTMENT_OTHER): Payer: Self-pay

## 2022-03-08 MED ORDER — METHOTREXATE SODIUM CHEMO INJECTION 50 MG/2ML
INTRAMUSCULAR | 0 refills | Status: DC
  Filled 2022-03-08: qty 12, 84d supply, fill #0

## 2022-03-09 ENCOUNTER — Other Ambulatory Visit (HOSPITAL_COMMUNITY): Payer: Self-pay

## 2022-03-09 DIAGNOSIS — M0589 Other rheumatoid arthritis with rheumatoid factor of multiple sites: Secondary | ICD-10-CM | POA: Diagnosis not present

## 2022-03-16 ENCOUNTER — Other Ambulatory Visit: Payer: Self-pay | Admitting: Sports Medicine

## 2022-03-16 ENCOUNTER — Other Ambulatory Visit (HOSPITAL_COMMUNITY): Payer: Self-pay

## 2022-03-16 ENCOUNTER — Other Ambulatory Visit (HOSPITAL_BASED_OUTPATIENT_CLINIC_OR_DEPARTMENT_OTHER): Payer: Self-pay

## 2022-03-16 DIAGNOSIS — E785 Hyperlipidemia, unspecified: Secondary | ICD-10-CM

## 2022-03-16 MED ORDER — FOLIC ACID 1 MG PO TABS
1.0000 mg | ORAL_TABLET | Freq: Every day | ORAL | 3 refills | Status: DC
  Filled 2022-03-16: qty 90, 90d supply, fill #0
  Filled 2022-06-12: qty 90, 90d supply, fill #1
  Filled 2022-09-16: qty 90, 90d supply, fill #2
  Filled 2022-12-11: qty 90, 90d supply, fill #3

## 2022-03-16 MED ORDER — ATORVASTATIN CALCIUM 40 MG PO TABS
ORAL_TABLET | ORAL | 3 refills | Status: DC
  Filled 2022-03-16: qty 90, 90d supply, fill #0
  Filled 2022-06-12: qty 90, 90d supply, fill #1
  Filled 2022-09-16: qty 90, 90d supply, fill #2
  Filled 2022-12-11: qty 90, 90d supply, fill #3

## 2022-03-17 ENCOUNTER — Other Ambulatory Visit (HOSPITAL_BASED_OUTPATIENT_CLINIC_OR_DEPARTMENT_OTHER): Payer: Self-pay

## 2022-03-17 MED ORDER — DOXYCYCLINE MONOHYDRATE 50 MG PO TABS
50.0000 mg | ORAL_TABLET | Freq: Every day | ORAL | 2 refills | Status: DC
  Filled 2022-03-17: qty 60, 60d supply, fill #0
  Filled 2022-05-22: qty 60, 60d supply, fill #1
  Filled 2022-07-25: qty 60, 60d supply, fill #2

## 2022-03-20 ENCOUNTER — Other Ambulatory Visit (HOSPITAL_BASED_OUTPATIENT_CLINIC_OR_DEPARTMENT_OTHER): Payer: Self-pay

## 2022-03-20 ENCOUNTER — Other Ambulatory Visit: Payer: Self-pay | Admitting: Sports Medicine

## 2022-03-20 DIAGNOSIS — E782 Mixed hyperlipidemia: Secondary | ICD-10-CM

## 2022-03-20 MED ORDER — PREDNISONE 5 MG PO TABS
5.0000 mg | ORAL_TABLET | Freq: Every day | ORAL | 0 refills | Status: DC
  Filled 2022-03-20: qty 90, 90d supply, fill #0

## 2022-03-20 MED ORDER — REPATHA SURECLICK 140 MG/ML ~~LOC~~ SOAJ
SUBCUTANEOUS | 11 refills | Status: DC
  Filled 2022-03-20 – 2022-03-21 (×2): qty 6, 84d supply, fill #0
  Filled 2022-06-26: qty 6, 84d supply, fill #1
  Filled 2022-09-20: qty 6, 84d supply, fill #2
  Filled 2022-12-07 – 2022-12-12 (×2): qty 6, 84d supply, fill #3
  Filled 2023-03-07: qty 6, 84d supply, fill #4

## 2022-03-21 ENCOUNTER — Other Ambulatory Visit (HOSPITAL_BASED_OUTPATIENT_CLINIC_OR_DEPARTMENT_OTHER): Payer: Self-pay

## 2022-03-22 ENCOUNTER — Telehealth: Payer: Self-pay

## 2022-03-22 ENCOUNTER — Other Ambulatory Visit (HOSPITAL_COMMUNITY): Payer: Self-pay

## 2022-03-22 NOTE — Telephone Encounter (Addendum)
Initiated Prior authorization [facility]:[id number] SureClick '140MG'$ /ML auto-injectors Via: Covermymeds Case/Key:[id number] Status: approved  as of 03/22/22 Reason:no pa needed Notified Pt via: Mychart

## 2022-04-17 ENCOUNTER — Other Ambulatory Visit (HOSPITAL_COMMUNITY): Payer: Self-pay

## 2022-04-27 ENCOUNTER — Other Ambulatory Visit (HOSPITAL_BASED_OUTPATIENT_CLINIC_OR_DEPARTMENT_OTHER): Payer: Self-pay

## 2022-04-28 ENCOUNTER — Other Ambulatory Visit (HOSPITAL_COMMUNITY): Payer: Self-pay

## 2022-05-01 ENCOUNTER — Other Ambulatory Visit (HOSPITAL_COMMUNITY): Payer: Self-pay

## 2022-05-22 ENCOUNTER — Other Ambulatory Visit (HOSPITAL_BASED_OUTPATIENT_CLINIC_OR_DEPARTMENT_OTHER): Payer: Self-pay

## 2022-05-23 ENCOUNTER — Other Ambulatory Visit (HOSPITAL_COMMUNITY): Payer: Self-pay

## 2022-05-29 ENCOUNTER — Other Ambulatory Visit: Payer: Self-pay | Admitting: Sports Medicine

## 2022-05-29 ENCOUNTER — Other Ambulatory Visit (HOSPITAL_BASED_OUTPATIENT_CLINIC_OR_DEPARTMENT_OTHER): Payer: Self-pay

## 2022-05-29 DIAGNOSIS — E782 Mixed hyperlipidemia: Secondary | ICD-10-CM

## 2022-05-29 MED ORDER — OMEGA-3-ACID ETHYL ESTERS 1 G PO CAPS
2.0000 | ORAL_CAPSULE | Freq: Two times a day (BID) | ORAL | 1 refills | Status: DC
  Filled 2022-05-29: qty 360, 90d supply, fill #0
  Filled 2022-11-27 – 2022-12-11 (×2): qty 360, 90d supply, fill #1

## 2022-05-31 ENCOUNTER — Encounter: Payer: Self-pay | Admitting: General Practice

## 2022-06-07 ENCOUNTER — Other Ambulatory Visit (HOSPITAL_COMMUNITY): Payer: Self-pay

## 2022-06-08 ENCOUNTER — Other Ambulatory Visit (HOSPITAL_COMMUNITY): Payer: Self-pay

## 2022-06-12 ENCOUNTER — Other Ambulatory Visit (HOSPITAL_BASED_OUTPATIENT_CLINIC_OR_DEPARTMENT_OTHER): Payer: Self-pay

## 2022-06-12 ENCOUNTER — Other Ambulatory Visit: Payer: Self-pay | Admitting: Sports Medicine

## 2022-06-13 ENCOUNTER — Other Ambulatory Visit (HOSPITAL_COMMUNITY): Payer: Self-pay

## 2022-06-13 ENCOUNTER — Other Ambulatory Visit (HOSPITAL_BASED_OUTPATIENT_CLINIC_OR_DEPARTMENT_OTHER): Payer: Self-pay

## 2022-06-13 DIAGNOSIS — E663 Overweight: Secondary | ICD-10-CM | POA: Diagnosis not present

## 2022-06-13 DIAGNOSIS — Z6828 Body mass index (BMI) 28.0-28.9, adult: Secondary | ICD-10-CM | POA: Diagnosis not present

## 2022-06-13 DIAGNOSIS — M1991 Primary osteoarthritis, unspecified site: Secondary | ICD-10-CM | POA: Diagnosis not present

## 2022-06-13 DIAGNOSIS — H16002 Unspecified corneal ulcer, left eye: Secondary | ICD-10-CM | POA: Diagnosis not present

## 2022-06-13 DIAGNOSIS — M3500 Sicca syndrome, unspecified: Secondary | ICD-10-CM | POA: Diagnosis not present

## 2022-06-13 DIAGNOSIS — M0589 Other rheumatoid arthritis with rheumatoid factor of multiple sites: Secondary | ICD-10-CM | POA: Diagnosis not present

## 2022-06-13 DIAGNOSIS — Z111 Encounter for screening for respiratory tuberculosis: Secondary | ICD-10-CM | POA: Diagnosis not present

## 2022-06-13 DIAGNOSIS — M79671 Pain in right foot: Secondary | ICD-10-CM | POA: Diagnosis not present

## 2022-06-13 MED ORDER — PREDNISONE 5 MG PO TABS
5.0000 mg | ORAL_TABLET | Freq: Every day | ORAL | 0 refills | Status: DC
  Filled 2022-06-13: qty 90, 90d supply, fill #0

## 2022-06-13 MED ORDER — PREDNISONE 5 MG PO TABS: 5.0000 mg | ORAL_TABLET | Freq: Every day | ORAL | 1 refills | Status: DC

## 2022-06-13 MED ORDER — LISINOPRIL-HYDROCHLOROTHIAZIDE 20-25 MG PO TABS
ORAL_TABLET | ORAL | 1 refills | Status: DC
  Filled 2022-06-13: qty 90, 90d supply, fill #0
  Filled 2022-09-16: qty 90, 90d supply, fill #1

## 2022-06-13 MED ORDER — TUBERCULIN-ALLERGY SYRINGES 27G X 1/2" 1 ML MISC
3 refills | Status: DC
Start: 2022-06-13 — End: 2024-07-21
  Filled 2022-06-15: qty 13, 90d supply, fill #0
  Filled 2023-01-03 – 2023-01-11 (×2): qty 13, 90d supply, fill #1

## 2022-06-13 MED ORDER — METHOTREXATE SODIUM CHEMO INJECTION 50 MG/2ML: 25.0000 mg | INTRAMUSCULAR | 1 refills | Status: DC

## 2022-06-14 ENCOUNTER — Other Ambulatory Visit (HOSPITAL_BASED_OUTPATIENT_CLINIC_OR_DEPARTMENT_OTHER): Payer: Self-pay

## 2022-06-15 ENCOUNTER — Other Ambulatory Visit (HOSPITAL_BASED_OUTPATIENT_CLINIC_OR_DEPARTMENT_OTHER): Payer: Self-pay

## 2022-06-26 ENCOUNTER — Other Ambulatory Visit (HOSPITAL_BASED_OUTPATIENT_CLINIC_OR_DEPARTMENT_OTHER): Payer: Self-pay

## 2022-06-28 ENCOUNTER — Other Ambulatory Visit (HOSPITAL_BASED_OUTPATIENT_CLINIC_OR_DEPARTMENT_OTHER): Payer: Self-pay

## 2022-07-04 ENCOUNTER — Other Ambulatory Visit (HOSPITAL_COMMUNITY): Payer: Self-pay

## 2022-07-05 ENCOUNTER — Other Ambulatory Visit (HOSPITAL_BASED_OUTPATIENT_CLINIC_OR_DEPARTMENT_OTHER): Payer: Self-pay

## 2022-07-06 ENCOUNTER — Other Ambulatory Visit (HOSPITAL_BASED_OUTPATIENT_CLINIC_OR_DEPARTMENT_OTHER): Payer: Self-pay

## 2022-07-12 ENCOUNTER — Other Ambulatory Visit (HOSPITAL_COMMUNITY): Payer: Self-pay

## 2022-07-25 ENCOUNTER — Other Ambulatory Visit (HOSPITAL_BASED_OUTPATIENT_CLINIC_OR_DEPARTMENT_OTHER): Payer: Self-pay

## 2022-07-31 DIAGNOSIS — H18463 Peripheral corneal degeneration, bilateral: Secondary | ICD-10-CM | POA: Diagnosis not present

## 2022-07-31 DIAGNOSIS — H16403 Unspecified corneal neovascularization, bilateral: Secondary | ICD-10-CM | POA: Diagnosis not present

## 2022-07-31 DIAGNOSIS — M069 Rheumatoid arthritis, unspecified: Secondary | ICD-10-CM | POA: Diagnosis not present

## 2022-07-31 DIAGNOSIS — H179 Unspecified corneal scar and opacity: Secondary | ICD-10-CM | POA: Diagnosis not present

## 2022-08-10 ENCOUNTER — Other Ambulatory Visit (HOSPITAL_COMMUNITY): Payer: Self-pay

## 2022-08-14 ENCOUNTER — Other Ambulatory Visit (HOSPITAL_COMMUNITY): Payer: Self-pay

## 2022-08-14 ENCOUNTER — Other Ambulatory Visit: Payer: Self-pay | Admitting: Pharmacist

## 2022-08-14 MED ORDER — ORENCIA CLICKJECT 125 MG/ML ~~LOC~~ SOAJ
SUBCUTANEOUS | 4 refills | Status: DC
  Filled 2022-08-14: qty 4, 28d supply, fill #0
  Filled 2022-09-15: qty 4, 28d supply, fill #1
  Filled 2022-10-12: qty 4, 28d supply, fill #2
  Filled 2022-11-08: qty 4, 28d supply, fill #3
  Filled 2022-12-13: qty 4, 28d supply, fill #4

## 2022-08-14 MED ORDER — ORENCIA CLICKJECT 125 MG/ML ~~LOC~~ SOAJ: SUBCUTANEOUS | 4 refills | Status: DC

## 2022-08-22 ENCOUNTER — Other Ambulatory Visit (HOSPITAL_BASED_OUTPATIENT_CLINIC_OR_DEPARTMENT_OTHER): Payer: Self-pay

## 2022-08-23 ENCOUNTER — Other Ambulatory Visit (HOSPITAL_COMMUNITY): Payer: Self-pay

## 2022-09-04 ENCOUNTER — Other Ambulatory Visit (HOSPITAL_BASED_OUTPATIENT_CLINIC_OR_DEPARTMENT_OTHER): Payer: Self-pay

## 2022-09-06 ENCOUNTER — Other Ambulatory Visit (HOSPITAL_BASED_OUTPATIENT_CLINIC_OR_DEPARTMENT_OTHER): Payer: Self-pay

## 2022-09-14 ENCOUNTER — Other Ambulatory Visit (HOSPITAL_COMMUNITY): Payer: Self-pay

## 2022-09-14 DIAGNOSIS — M0589 Other rheumatoid arthritis with rheumatoid factor of multiple sites: Secondary | ICD-10-CM | POA: Diagnosis not present

## 2022-09-15 ENCOUNTER — Other Ambulatory Visit (HOSPITAL_COMMUNITY): Payer: Self-pay

## 2022-09-16 ENCOUNTER — Other Ambulatory Visit: Payer: Self-pay | Admitting: Sports Medicine

## 2022-09-16 ENCOUNTER — Other Ambulatory Visit (HOSPITAL_BASED_OUTPATIENT_CLINIC_OR_DEPARTMENT_OTHER): Payer: Self-pay

## 2022-09-16 DIAGNOSIS — E782 Mixed hyperlipidemia: Secondary | ICD-10-CM

## 2022-09-18 ENCOUNTER — Other Ambulatory Visit (HOSPITAL_BASED_OUTPATIENT_CLINIC_OR_DEPARTMENT_OTHER): Payer: Self-pay

## 2022-09-18 MED ORDER — PREDNISONE 5 MG PO TABS
5.0000 mg | ORAL_TABLET | Freq: Every day | ORAL | 0 refills | Status: DC
  Filled 2022-09-18: qty 90, 90d supply, fill #0

## 2022-09-19 ENCOUNTER — Other Ambulatory Visit (HOSPITAL_BASED_OUTPATIENT_CLINIC_OR_DEPARTMENT_OTHER): Payer: Self-pay

## 2022-09-19 ENCOUNTER — Other Ambulatory Visit: Payer: Self-pay

## 2022-09-20 ENCOUNTER — Other Ambulatory Visit (HOSPITAL_BASED_OUTPATIENT_CLINIC_OR_DEPARTMENT_OTHER): Payer: Self-pay

## 2022-09-21 ENCOUNTER — Other Ambulatory Visit (HOSPITAL_BASED_OUTPATIENT_CLINIC_OR_DEPARTMENT_OTHER): Payer: Self-pay

## 2022-09-21 MED ORDER — DOXYCYCLINE MONOHYDRATE 50 MG PO TABS
50.0000 mg | ORAL_TABLET | Freq: Every day | ORAL | 2 refills | Status: DC
  Filled 2022-09-21: qty 60, 60d supply, fill #0
  Filled 2022-11-22: qty 60, 60d supply, fill #1
  Filled 2023-01-17: qty 60, 60d supply, fill #2

## 2022-09-25 ENCOUNTER — Ambulatory Visit: Payer: Medicare Other | Attending: Sports Medicine | Admitting: Pharmacist

## 2022-09-25 DIAGNOSIS — Z79899 Other long term (current) drug therapy: Secondary | ICD-10-CM

## 2022-09-25 NOTE — Progress Notes (Signed)
  S: Patient presents for review of their specialty medication therapy.  Patient is currently taking Orencia for rheumatoid arthritis. Patient is managed by Dr. Amil Amen for this.   Adherence: denies any missed doses.   Efficacy: reports that it is still working very well for him  FDA-approved dosing: 750 mg every 4 weeks (gets infusion at [facility]).  Drug-drug interactions: none  Screenings: TB screening: completed per patient Hepatitis Screening: completed per patient Blood glucose: Orencia contains maltose which make falsely elevate glucose levels  Monitoring: S/sx of infection: denies.  S/sx of hypersensitivity: denies  Other adverse effects: denies  O:  Lab Results  Component Value Date   WBC 10.5 10/19/2021   HGB 14.3 10/19/2021   HCT 42.0 10/19/2021   MCV 94.2 10/19/2021   PLT 274 10/19/2021      Chemistry      Component Value Date/Time   NA 139 10/19/2021 0000   K 4.5 10/19/2021 0000   CL 106 10/19/2021 0000   CO2 25 10/19/2021 0000   BUN 26 (H) 10/19/2021 0000   CREATININE 1.07 10/19/2021 0000      Component Value Date/Time   CALCIUM 10.9 (H) 10/19/2021 0000   ALKPHOS 78 09/25/2016 0530   AST 14 10/19/2021 0000   ALT 10 10/19/2021 0000   BILITOT 0.6 10/19/2021 0000       A/P: 1. Medication review: Patient currently on [place] for the treatment of rheumatoid arthritis and is tolerating it well with continued improved control of RA. Reviewed the medication with the patient, including the following: Orencia is a selective T-cell costimulation blocker indicated for rheumatoid arthritis. The most common adverse effects are infections, headache, and injection site reactions. There is a possible adverse effect of increased risk of malignancy but it is not fully understood if this is due to the drug or the disease state itself. The patient was instructed to avoid use of live vaccinations without the approval of a physician. No recommendations for  any changes.  Benard Halsted, PharmD, [place], [place] [phone]

## 2022-10-05 ENCOUNTER — Other Ambulatory Visit (HOSPITAL_COMMUNITY): Payer: Self-pay

## 2022-10-06 ENCOUNTER — Ambulatory Visit (INDEPENDENT_AMBULATORY_CARE_PROVIDER_SITE_OTHER): Payer: 59

## 2022-10-06 ENCOUNTER — Ambulatory Visit
Admission: EM | Admit: 2022-10-06 | Discharge: 2022-10-06 | Disposition: A | Discharge status: Home / Self Care | Payer: Medicare Other | Attending: Family Medicine | Admitting: Family Medicine

## 2022-10-06 ENCOUNTER — Other Ambulatory Visit (HOSPITAL_BASED_OUTPATIENT_CLINIC_OR_DEPARTMENT_OTHER): Payer: Self-pay

## 2022-10-06 ENCOUNTER — Encounter: Payer: Self-pay | Admitting: Emergency Medicine

## 2022-10-06 DIAGNOSIS — J40 Bronchitis, not specified as acute or chronic: Secondary | ICD-10-CM | POA: Diagnosis not present

## 2022-10-06 DIAGNOSIS — J329 Chronic sinusitis, unspecified: Secondary | ICD-10-CM

## 2022-10-06 DIAGNOSIS — R059 Cough, unspecified: Secondary | ICD-10-CM

## 2022-10-06 DIAGNOSIS — R069 Unspecified abnormalities of breathing: Secondary | ICD-10-CM

## 2022-10-06 MED ORDER — DOXYCYCLINE HYCLATE 100 MG PO CAPS
100.0000 mg | ORAL_CAPSULE | Freq: Two times a day (BID) | ORAL | 0 refills | Status: AC
  Filled 2022-10-06: qty 20, 10d supply, fill #0

## 2022-10-06 MED ORDER — PREDNISONE 10 MG PO TABS
ORAL_TABLET | Freq: Every day | ORAL | 0 refills | Status: DC
  Filled 2022-10-06: qty 42, 12d supply, fill #0

## 2022-10-06 MED ORDER — PROMETHAZINE-DM 6.25-15 MG/5ML PO SYRP
5.0000 mL | ORAL_SOLUTION | Freq: Two times a day (BID) | ORAL | 0 refills | Status: DC | PRN
  Filled 2022-10-06: qty 118, 12d supply, fill #0

## 2022-10-06 MED ORDER — BENZONATATE 200 MG PO CAPS
200.0000 mg | ORAL_CAPSULE | Freq: Three times a day (TID) | ORAL | 0 refills | Status: AC | PRN
  Filled 2022-10-06: qty 40, 14d supply, fill #0

## 2022-10-06 NOTE — ED Provider Notes (Signed)
[facility]    CSN: [id number] Arrival date & time: 10/06/22  0941      History   Chief Complaint Chief Complaint  Patient presents with   Cough    HPI Todd Valdez is a 61 y.o. male.   HPI 61 year old male presents with cough, sinus drainage and congestion including shortness of breath for 1 week.  PMH significant for HTN, rheumatoid arthritis, and LPRD.  Past Medical History:  Diagnosis Date   Arthritis    Cancer ([place])    Melanoma on  back   Diverticulitis    GERD (gastroesophageal reflux disease)    High triglycerides    History of abscess of skin and subcutaneous tissue    right forearm, chin   Hypertension    RA (rheumatoid arthritis) (HCC)    Reflux esophagitis    Right inguinal hernia    Sleep apnea    no cpap    Patient Active Problem List   Diagnosis Date Noted   Hypercalcemia 11/18/2020   Lumbar spinal stenosis 10/25/2018   Inguinal hernia, right 10/25/2017   Current chronic use of systemic steroids 10/25/2017   Annual physical exam 05/01/2016   Allergic rhinitis 01/21/2015   LPRD (laryngopharyngeal reflux disease) 01/23/2013   Hyperlipidemia, predominantly triglycerides 10/24/2012   Gastric outlet obstruction 12/21/2011   Hypertension 08/15/2011   Rheumatoid arthritis (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 Authorizing Provider  Abatacept (ORENCIA CLICKJECT) 277 MG/ML SOAJ Inject 1 ml subcutaneously once a week. 08/14/22  Yes Tresa Garter, MD  atorvastatin (LIPITOR) 40 MG tablet TAKE 1 TABLET BY MOUTH ONCE DAILY AT [facility] 03/16/22 03/16/23 Yes Silverio Decamp, MD  benzonatate (TESSALON) 200 MG capsule Take 1 capsule (200 mg total) by mouth 3 (three) times daily as needed for up to 7 days. 10/06/22 10/13/22 Yes Eliezer Lofts, FNP  COVID-19 mRNA bivalent vaccine, Pfizer, injection Inject into the muscle. 07/15/21  Yes Carlyle Basques, MD  cyclobenzaprine (FLEXERIL) 10 MG tablet One half tab PO qHS, then increase gradually to one tab TID. 10/25/18  Yes Silverio Decamp, MD  docusate sodium (COLACE) 100 MG capsule Take 1 capsule (100 mg total) by mouth 2 (two) times daily. While taking narcotic pain medicine. 02/17/21  Yes Corky Sing, PA-C  doxycycline (ADOXA) 50 MG tablet TAKE 1/2 TABLET BY MOUTH TWO TIMES DAILY 06/24/21  Yes   doxycycline (ADOXA) 50 MG tablet Take 1 tablet (50 mg total) by mouth daily. 09/21/22  Yes   doxycycline (VIBRAMYCIN) 100 MG capsule Take 1 capsule (100 mg total) by mouth 2 (two) times daily for  10 days. 10/06/22 10/16/22 Yes Eliezer Lofts, FNP  esomeprazole (NEXIUM) 40 MG capsule TAKE 1 CAPSULE (40 MG TOTAL) BY MOUTH DAILY AT 12:00 NOON. 02/08/22 02/08/23 Yes Silverio Decamp, MD  Evolocumab (REPATHA SURECLICK) 272 MG/ML SOAJ INJECT '140MG'$  INTO THE SKIN EVERY 14 DAYS 03/20/22 03/20/23 Yes Silverio Decamp, MD  fenofibrate 160 MG tablet Take 1 tablet (160 mg total) by mouth daily. 02/09/22  Yes Silverio Decamp, MD  folic acid (FOLVITE) 1 MG tablet Take 1 tablet by mouth once daily 03/16/22  Yes   HYDROcodone-acetaminophen (NORCO/VICODIN)  5-325 MG tablet Take 1 tablet every 4 hours by oral route as needed for 3 days. 03/04/21  Yes   ibuprofen (ADVIL) 800 MG tablet Take 1 tablet 3 times a day by oral route 03/04/21  Yes   lisinopril-hydrochlorothiazide (ZESTORETIC) 20-25 MG tablet TAKE 1 TABLET BY MOUTH ONCE DAILY **NEEDS APPOINTMENT FOR FURTHER REFILLS** 06/13/22 06/13/23 Yes Silverio Decamp, MD  methotrexate 250 MG/10ML injection Inject 1 ml Injection once a week 90 days 03/10/21  Yes   methotrexate 50 MG/2ML injection Inject 1 mL (25 mg total) into the skin once a week. 03/08/22  Yes   methotrexate 50 MG/2ML injection Inject 1 mL (25 mg total) once a week. 06/13/22  Yes   Needles & Syringes (1CC TB SYRINGE) MISC    Yes [provider]  niacin (,VITAMIN B3,) 500 MG tablet Take 1 tablet (500 mg total) by mouth at bedtime. 03/07/22  Yes Silverio Decamp, MD  omega-3 acid ethyl esters (LOVAZA) 1 g capsule TAKE 2 CAPSULES (2 G TOTAL) BY MOUTH 2 (TWO) TIMES DAILY. 05/29/22 05/29/23 Yes Silverio Decamp, MD  predniSONE (STERAPRED UNI-PAK 21 TAB) 10 MG (21) TBPK tablet Take by mouth daily. Take 6 tabs by mouth daily  for 2 days, then 5 tabs for 2 days, then 4 tabs for 2 days, then 3 tabs for 2 days, 2 tabs for 2 days, then 1 tab by mouth daily for 2 days 10/06/22  Yes Eliezer Lofts, FNP  promethazine-dextromethorphan (PROMETHAZINE-DM) 6.25-15 MG/5ML syrup Take 5 mLs by mouth 2 (two) times daily as needed for cough. 10/06/22  Yes Eliezer Lofts, FNP  senna (SENOKOT) 8.6 MG TABS tablet Take 2 tablets (17.2 mg total) by mouth 2 (two) times daily. 02/17/21  Yes Corky Sing, PA-C  TUBERCULIN SYR 1CC/27GX1/2" (B-D TB SYRINGE 1CC/27GX1/2") 27G X 1/2" 1 ML MISC use to inject methotrexate once a week 02/16/21  Yes   Tuberculin-Allergy Syringes 27G X 1/2" 1 ML MISC Use once a week for subcutaneous MTX injection 06/13/22  Yes     Family History Family History  Problem Relation Age of Onset   Hypertension Mother    Hypertension  Father     Social History Social History   Tobacco Use   Smoking status: Former    Types: Cigarettes    Quit date: 02/07/1991    Years since quitting: 31.6   Smokeless tobacco: Former    Quit date: 08/14/2009  Vaping Use   Vaping Use: Never used  Substance Use Topics   Alcohol use: No   Drug use: No     Allergies   Patient has no known allergies.   Review of Systems Review of Systems  HENT:  Positive for congestion and sinus pressure.   Respiratory:  Positive for cough and shortness of breath.   All other systems reviewed and are negative.    Physical Exam Triage Vital Signs ED Triage Vitals  Enc Vitals Group     BP 10/06/22 1109 111/69     Pulse Rate 10/06/22 1109 (!) 106     Resp 10/06/22 1109 18     Temp 10/06/22 1109 99 F (37.2 C)     Temp Source 10/06/22 1109 Oral     SpO2 10/06/22 1109 93 %     Weight 10/06/22 1111 212 lb (96.2 kg)     Height 10/06/22 1111 '6\' 1"'$  (1.854 m)     Head Circumference --      Peak Flow --      Pain Score 10/06/22 1110 0     Pain Loc --      Pain Edu? --      Excl. in [place]? --    No data found.  Updated Vital Signs BP 111/69 (BP Location: Right Arm)   Pulse (!) 106   Temp 99 F (37.2 C) (Oral)   Resp 18   Ht '6\' 1"'$  (1.854 m)   Wt 212 lb (96.2 kg)   SpO2 93%   BMI 27.97 kg/m   Visual Acuity Right Eye Distance:   Left Eye Distance:   Bilateral Distance:    Right Eye Near:   Left Eye Near:    Bilateral Near:     Physical Exam Vitals and nursing note reviewed.  Constitutional:      General: He is not in acute distress.    Appearance: Normal appearance. He is obese. He is ill-appearing.  HENT:     Head: Normocephalic and atraumatic.     Right Ear: Tympanic membrane and external ear normal.     Left Ear: Tympanic membrane and external ear normal.     Ears:     Comments: Moderate eustachian tube dysfunction noted bilaterally    Mouth/Throat:     Mouth: Mucous membranes are moist.     Pharynx: Oropharynx is  clear.  Eyes:     Extraocular Movements: Extraocular movements intact.     Conjunctiva/sclera: Conjunctivae normal.     Pupils: Pupils are equal, round, and reactive to light.  Cardiovascular:     Rate and Rhythm: Normal rate and regular rhythm.     Pulses: Normal pulses.     Heart sounds: Normal heart sounds.  Pulmonary:     Effort: Pulmonary effort is normal.     Breath sounds: Normal breath sounds. No wheezing or rales.     Comments: Diffuse scattered rhonchi noted throughout, decreased/diminished breath sounds bibasilarly, infrequent nonproductive cough Musculoskeletal:        General: Normal range of motion.     Cervical back: Neck supple. No tenderness.  Lymphadenopathy:     Cervical: No cervical adenopathy.  Skin:    General: Skin is warm and dry.  Neurological:     General: No focal deficit present.     Mental Status: He is alert and oriented to person, place, and time.      UC Treatments / Results  Labs (all labs ordered are listed, but only abnormal results are displayed) Labs Reviewed - No data to display  EKG   Radiology DG Chest 2 View  Result Date: 10/06/2022 CLINICAL DATA:  Cough x 1 week, adventitious breath sounds on exam EXAM: CHEST - 2 VIEW COMPARISON:  Chest x-ray December 21, 2014. FINDINGS: The heart size and mediastinal contours are within normal limits. Mild streaky left basilar opacities. No confluent consolidation. No visible pleural effusions or pneumothorax. No acute osseous abnormality. IMPRESSION: Mild streaky left basilar opacities,  favor atelectasis. Electronically Signed   By: Margaretha Sheffield M.D.   On: 10/06/2022 12:25    Procedures Procedures (including critical care time)  Medications Ordered in UC Medications - No data to display  Initial Impression / Assessment and Plan / UC Course  I have reviewed the triage vital signs and the nursing notes.  Pertinent labs & imaging results that were available during my care of the patient were  reviewed by me and considered in my medical decision making (see chart for details).     MDM: 1.  Sinobronchitis-Rx'd doxycycline, Sterapred Unipak; 2.  Cough-CXR revealed above, Rx'd Tessalon, Promethazine DM. Advised patient of chest x-ray results with hardcopy provided to patient.  Instructed patient to take medications as directed with food to completion.  Advised patient to take Sterapred Unipak with first dose of doxycycline for the next 10 days.  Advised may use Tessalon daily or as needed for cough.  Advised may use Promethazine DM at night for cough due to sedative effects.  Advised do not use cough medications together.  Encouraged increase daily water intake to 64 ounces per day while taking these medications.  Advised if symptoms worsen and or unresolved please follow-up with PCP or here for further evaluation.  Patient discharged home, hemodynamically stable. Final Clinical Impressions(s) / UC Diagnoses   Final diagnoses:  Cough, unspecified type  Sinobronchitis     Discharge Instructions      Advised patient of chest x-ray results with hardcopy provided to patient.  Instructed patient to take medications as directed with food to completion.  Advised patient to take Sterapred Unipak with first dose of doxycycline for the next 10 days.  Advised may use Tessalon daily or as needed for cough.  Advised may use Promethazine DM at night for cough due to sedative effects.  Advised do not use cough medications together.  Encouraged increase daily water intake to 64 ounces per day while taking these medications.  Advised if symptoms worsen and or unresolved please follow-up with PCP or here for further evaluation.     ED Prescriptions     Medication Sig Dispense Auth. Provider   doxycycline (VIBRAMYCIN) 100 MG capsule Take 1 capsule (100 mg total) by mouth 2 (two) times daily for 10 days. 20 capsule Eliezer Lofts, FNP   predniSONE (STERAPRED UNI-PAK 21 TAB) 10 MG (21) TBPK tablet Take  by mouth daily. Take 6 tabs by mouth daily  for 2 days, then 5 tabs for 2 days, then 4 tabs for 2 days, then 3 tabs for 2 days, 2 tabs for 2 days, then 1 tab by mouth daily for 2 days 42 tablet Eliezer Lofts, FNP   benzonatate (TESSALON) 200 MG capsule Take 1 capsule (200 mg total) by mouth 3 (three) times daily as needed for up to 7 days. 40 capsule Eliezer Lofts, FNP   promethazine-dextromethorphan (PROMETHAZINE-DM) 6.25-15 MG/5ML syrup Take 5 mLs by mouth 2 (two) times daily as needed for cough. 118 mL Eliezer Lofts, FNP      PDMP not reviewed this encounter.   Eliezer Lofts, [place] 10/06/22 1240

## 2022-10-06 NOTE — ED Triage Notes (Signed)
Patient c/o cough, sinus drainage and congestion, SOB x 1 week.  Patient has taken Alka Seltzer Cold & Plus, Mucinex and otc cold syrup.

## 2022-10-06 NOTE — Discharge Instructions (Addendum)
Advised patient of chest x-ray results with hardcopy provided to patient.  Instructed patient to take medications as directed with food to completion.  Advised patient to take Sterapred Unipak with first dose of doxycycline for the next 10 days.  Advised may use Tessalon daily or as needed for cough.  Advised may use Promethazine DM at night for cough due to sedative effects.  Advised do not use cough medications together.  Encouraged increase daily water intake to 64 ounces per day while taking these medications.  Advised if symptoms worsen and or unresolved please follow-up with PCP or here for further evaluation.

## 2022-10-10 ENCOUNTER — Other Ambulatory Visit (HOSPITAL_COMMUNITY): Payer: Self-pay

## 2022-10-12 ENCOUNTER — Other Ambulatory Visit (HOSPITAL_COMMUNITY): Payer: Self-pay

## 2022-10-16 ENCOUNTER — Other Ambulatory Visit: Payer: Self-pay

## 2022-10-19 ENCOUNTER — Ambulatory Visit (INDEPENDENT_AMBULATORY_CARE_PROVIDER_SITE_OTHER): Payer: Commercial Managed Care - PPO | Admitting: Sports Medicine

## 2022-10-19 ENCOUNTER — Encounter: Payer: Self-pay | Admitting: Sports Medicine

## 2022-10-19 ENCOUNTER — Ambulatory Visit (INDEPENDENT_AMBULATORY_CARE_PROVIDER_SITE_OTHER): Payer: Commercial Managed Care - PPO

## 2022-10-19 ENCOUNTER — Other Ambulatory Visit (HOSPITAL_BASED_OUTPATIENT_CLINIC_OR_DEPARTMENT_OTHER): Payer: Self-pay

## 2022-10-19 VITALS — BP 117/68 | HR 93 | Wt 213.0 lb

## 2022-10-19 DIAGNOSIS — R052 Subacute cough: Secondary | ICD-10-CM

## 2022-10-19 DIAGNOSIS — R059 Cough, unspecified: Secondary | ICD-10-CM | POA: Diagnosis not present

## 2022-10-19 DIAGNOSIS — R5381 Other malaise: Secondary | ICD-10-CM | POA: Diagnosis not present

## 2022-10-19 DIAGNOSIS — E782 Mixed hyperlipidemia: Secondary | ICD-10-CM

## 2022-10-19 DIAGNOSIS — Z0000 Encounter for general adult medical examination without abnormal findings: Secondary | ICD-10-CM | POA: Diagnosis not present

## 2022-10-19 MED ORDER — NIACIN ER 500 MG PO TBCR
500.0000 mg | EXTENDED_RELEASE_TABLET | Freq: Every day | ORAL | 1 refills | Status: DC
  Filled 2022-10-19 – 2022-10-20 (×2): qty 100, 100d supply, fill #0
  Filled 2023-01-23: qty 100, 90d supply, fill #1

## 2022-10-19 MED ORDER — AZITHROMYCIN 250 MG PO TABS
ORAL_TABLET | ORAL | 0 refills | Status: DC
  Filled 2022-10-19: qty 6, 5d supply, fill #0

## 2022-10-19 MED ORDER — PREDNISONE 10 MG PO TABS
ORAL_TABLET | ORAL | 0 refills | Status: AC
  Filled 2022-10-19: qty 42, 12d supply, fill #0

## 2022-10-19 NOTE — Progress Notes (Signed)
Subjective:    CC: Annual Physical Exam  HPI:  This patient is here for their annual physical  I reviewed the past medical history, family history, social history, surgical history, and allergies today and no changes were needed.  Please see the problem list section below in epic for further details.  Past Medical History: Past Medical History:  Diagnosis Date   Arthritis    Cancer ([place])    Melanoma on  back   Diverticulitis    GERD (gastroesophageal reflux disease)    High triglycerides    History of abscess of skin and subcutaneous tissue    right forearm, chin   Hypertension    RA (rheumatoid arthritis) (HCC)    Reflux esophagitis    Right inguinal hernia    Sleep apnea    no cpap   Past Surgical History: Past Surgical History:  Procedure Laterality Date   bilateral feet reconstruction  10/2000   COLONOSCOPY     feet surgery     INGUINAL HERNIA REPAIR Right 11/27/2017   Procedure: OPEN RIGHT INGUINAL HERNIA REPAIR;  Surgeon: Armandina Gemma, MD;  Location: [facility];  Service: General;  Laterality: Right;   INSERTION OF MESH Right 11/27/2017   Procedure: INSERTION OF MESH;  Surgeon: Armandina Gemma, MD;  Location: [facility];  Service: General;  Laterality: Right;   MELANOMA EXCISION     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   Procedure: Right transmetatarsal amputation and heelcord lengthening;  Surgeon: Wylene Simmer, MD;  Location: [facility];  Service: Orthopedics;  Laterality: Right;   UPPER GI ENDOSCOPY  12/2011   Social History: Social History   Socioeconomic History   Marital status: Married    Spouse name: Not on file   Number of children: Not on file   Years of education: Not on file   Highest education level: Not on file  Occupational History   Not on file  Tobacco Use   Smoking status: Former    Types: Cigarettes    Quit date: 02/07/1991    Years since quitting: 31.7   Smokeless tobacco: Former     Quit date: 08/14/2009  Vaping Use   Vaping Use: Never used  Substance and Sexual Activity   Alcohol use: No   Drug use: No   Sexual activity: Yes    Partners: Female    Comment: married.   Other Topics Concern   Not on file  Social History Narrative   Not on file   Social Determinants of Health   Financial Resource Strain: Not on file  Food Insecurity: Not on file  Transportation Needs: Not on file  Physical Activity: Not on file  Stress: Not on file  Social Connections: Not on file   Family History: Family History  Problem Relation Age of Onset   Hypertension Mother    Hypertension Father    Allergies: No Known Allergies Medications: See med rec.  Review of Systems: No headache, visual changes, nausea, vomiting, diarrhea, constipation, dizziness, abdominal pain, skin rash, fevers, chills, night sweats, swollen lymph nodes, weight loss, chest pain, body aches, joint swelling, muscle aches, shortness of breath, mood changes, visual or auditory hallucinations.  Objective:    General: Well Developed, well nourished, and in no acute distress.  Neuro: Alert and oriented x3, extra-ocular muscles intact, sensation grossly intact. Cranial nerves II through XII are intact, motor, sensory, and coordinative functions are all intact. HEENT: Normocephalic, atraumatic, pupils equal round reactive to light, neck supple,  no masses, no lymphadenopathy, thyroid nonpalpable. Oropharynx, nasopharynx, external ear canals are unremarkable. Skin: Warm and dry, no rashes noted.  Cardiac: Regular rate and rhythm, no murmurs rubs or gallops.  Respiratory: Expiratory wheezes and rales left lung field. Not using accessory muscles, speaking in full sentences.  Abdominal: Soft, nontender, nondistended, positive bowel sounds, no masses, no organomegaly.  Musculoskeletal: Shoulder, elbow, wrist, hip, knee, ankle stable, and with full range of motion.  Impression and Recommendations:    The patient was  counselled, risk factors were discussed, anticipatory guidance given.  Annual physical exam Annual physical as above, up-to-date on screenings, declines Shingrix. Return to see me in a year for fasting annual physical, we will get blood work in a couple weeks.  Coughing Had a couple weeks of cough, malaise, treated with doxycycline and steroids, partial improvement, streaky opacities in the lung bases on previous chest x-ray. Still has left lower lobe rales and wheezes. Adding 12-day prednisone taper, azithromycin, updated chest x-ray. I be happy to give him some Tussionex if needed, return to see me if not better in a couple of weeks. I have advised him not to get his labs done until a week after he starts to feel better.   ____________________________________________ Gwen Her. Dianah Field, M.D., ABFM., CAQSM., AME. Primary Care and Sports Medicine [facility]  Adjunct Professor of [employer]  Risk manager

## 2022-10-19 NOTE — Assessment & Plan Note (Signed)
Annual physical as above, up-to-date on screenings, declines Shingrix. Return to see me in a year for fasting annual physical, we will get blood work in a couple weeks.

## 2022-10-19 NOTE — Assessment & Plan Note (Signed)
Had a couple weeks of cough, malaise, treated with doxycycline and steroids, partial improvement, streaky opacities in the lung bases on previous chest x-ray. Still has left lower lobe rales and wheezes. Adding 12-day prednisone taper, azithromycin, updated chest x-ray. I be happy to give him some Tussionex if needed, return to see me if not better in a couple of weeks. I have advised him not to get his labs done until a week after he starts to feel better.

## 2022-10-20 ENCOUNTER — Other Ambulatory Visit (HOSPITAL_BASED_OUTPATIENT_CLINIC_OR_DEPARTMENT_OTHER): Payer: Self-pay

## 2022-11-08 ENCOUNTER — Other Ambulatory Visit (HOSPITAL_COMMUNITY): Payer: Self-pay

## 2022-11-08 DIAGNOSIS — E782 Mixed hyperlipidemia: Secondary | ICD-10-CM | POA: Diagnosis not present

## 2022-11-09 LAB — COMPLETE METABOLIC PANEL WITH GFR
AG Ratio: 2.1 (calc) (ref 1.0–2.5)
ALT: 16 U/L (ref 9–46)
AST: 14 U/L (ref 10–35)
Albumin: 4.4 g/dL (ref 3.6–5.1)
Alkaline phosphatase (APISO): 54 U/L (ref 35–144)
BUN: 24 mg/dL (ref 7–25)
CO2: 24 mmol/L (ref 20–32)
Calcium: 10.2 mg/dL (ref 8.6–10.3)
Chloride: 105 mmol/L (ref 98–110)
Creat: 1.11 mg/dL (ref 0.70–1.35)
Globulin: 2.1 g/dL (calc) (ref 1.9–3.7)
Glucose, Bld: 81 mg/dL (ref 65–99)
Potassium: 4.3 mmol/L (ref 3.5–5.3)
Sodium: 139 mmol/L (ref 135–146)
Total Bilirubin: 0.6 mg/dL (ref 0.2–1.2)
Total Protein: 6.5 g/dL (ref 6.1–8.1)
eGFR: 75 mL/min/{1.73_m2} (ref >=60)

## 2022-11-09 LAB — CBC
HCT: 40.5 % (ref 38.5–50.0)
Hemoglobin: 14 g/dL (ref 13.2–17.1)
MCH: 31.7 pg (ref 27.0–33.0)
MCHC: 34.6 g/dL (ref 32.0–36.0)
MCV: 91.6 fL (ref 80.0–100.0)
MPV: 10.7 fL (ref 7.5–12.5)
Platelets: 233 10*3/uL (ref 140–400)
RBC: 4.42 10*6/uL (ref 4.20–5.80)
RDW: 12.6 % (ref 11.0–15.0)
WBC: 11.1 10*3/uL — ABNORMAL HIGH (ref 3.8–10.8)

## 2022-11-09 LAB — HEMOGLOBIN A1C
Hgb A1c MFr Bld: 5.8 % of total Hgb — ABNORMAL HIGH (ref <5.7)
Mean Plasma Glucose: 120 mg/dL
eAG (mmol/L): 6.6 mmol/L

## 2022-11-09 LAB — LIPID PANEL
Cholesterol: 78 mg/dL (ref <200)
HDL: 43 mg/dL (ref >=40)
LDL Cholesterol (Calc): 10 mg/dL (calc)
Non-HDL Cholesterol (Calc): 35 mg/dL (calc) (ref <130)
Total CHOL/HDL Ratio: 1.8 (calc) (ref <5.0)
Triglycerides: 186 mg/dL — ABNORMAL HIGH (ref <150)

## 2022-11-09 LAB — TSH: TSH: 1.28 mIU/L (ref 0.40–4.50)

## 2022-11-13 ENCOUNTER — Other Ambulatory Visit: Payer: Self-pay

## 2022-11-22 ENCOUNTER — Other Ambulatory Visit (HOSPITAL_BASED_OUTPATIENT_CLINIC_OR_DEPARTMENT_OTHER): Payer: Self-pay

## 2022-11-22 ENCOUNTER — Other Ambulatory Visit: Payer: Self-pay

## 2022-11-22 ENCOUNTER — Other Ambulatory Visit: Payer: Self-pay | Admitting: Sports Medicine

## 2022-11-22 MED ORDER — ESOMEPRAZOLE MAGNESIUM 40 MG PO CPDR
40.0000 mg | DELAYED_RELEASE_CAPSULE | Freq: Every day | ORAL | 3 refills | Status: DC
  Filled 2022-11-22: qty 75, 75d supply, fill #0
  Filled 2023-01-31: qty 75, 75d supply, fill #1
  Filled 2023-04-16: qty 75, 75d supply, fill #2
  Filled 2023-06-25: qty 75, 75d supply, fill #3

## 2022-11-24 ENCOUNTER — Other Ambulatory Visit (HOSPITAL_BASED_OUTPATIENT_CLINIC_OR_DEPARTMENT_OTHER): Payer: Self-pay

## 2022-12-01 ENCOUNTER — Other Ambulatory Visit (HOSPITAL_COMMUNITY): Payer: Self-pay

## 2022-12-07 ENCOUNTER — Other Ambulatory Visit (HOSPITAL_BASED_OUTPATIENT_CLINIC_OR_DEPARTMENT_OTHER): Payer: Self-pay

## 2022-12-08 ENCOUNTER — Other Ambulatory Visit (HOSPITAL_BASED_OUTPATIENT_CLINIC_OR_DEPARTMENT_OTHER): Payer: Self-pay

## 2022-12-11 ENCOUNTER — Other Ambulatory Visit: Payer: Self-pay | Admitting: Sports Medicine

## 2022-12-11 ENCOUNTER — Other Ambulatory Visit: Payer: Self-pay

## 2022-12-11 ENCOUNTER — Other Ambulatory Visit (HOSPITAL_BASED_OUTPATIENT_CLINIC_OR_DEPARTMENT_OTHER): Payer: Self-pay

## 2022-12-11 MED ORDER — LISINOPRIL-HYDROCHLOROTHIAZIDE 20-25 MG PO TABS
1.0000 | ORAL_TABLET | Freq: Every day | ORAL | 1 refills | Status: DC
  Filled 2022-12-11: qty 90, 90d supply, fill #0
  Filled 2023-11-16: qty 90, 90d supply, fill #1

## 2022-12-12 ENCOUNTER — Other Ambulatory Visit (HOSPITAL_BASED_OUTPATIENT_CLINIC_OR_DEPARTMENT_OTHER): Payer: Self-pay

## 2022-12-12 DIAGNOSIS — M1991 Primary osteoarthritis, unspecified site: Secondary | ICD-10-CM | POA: Diagnosis not present

## 2022-12-12 DIAGNOSIS — M0589 Other rheumatoid arthritis with rheumatoid factor of multiple sites: Secondary | ICD-10-CM | POA: Diagnosis not present

## 2022-12-12 DIAGNOSIS — H16002 Unspecified corneal ulcer, left eye: Secondary | ICD-10-CM | POA: Diagnosis not present

## 2022-12-12 DIAGNOSIS — M79671 Pain in right foot: Secondary | ICD-10-CM | POA: Diagnosis not present

## 2022-12-12 DIAGNOSIS — E669 Obesity, unspecified: Secondary | ICD-10-CM | POA: Diagnosis not present

## 2022-12-12 DIAGNOSIS — M3501 Sicca syndrome with keratoconjunctivitis: Secondary | ICD-10-CM | POA: Diagnosis not present

## 2022-12-12 DIAGNOSIS — Z6830 Body mass index (BMI) 30.0-30.9, adult: Secondary | ICD-10-CM | POA: Diagnosis not present

## 2022-12-13 ENCOUNTER — Other Ambulatory Visit (HOSPITAL_COMMUNITY): Payer: Self-pay

## 2022-12-14 ENCOUNTER — Other Ambulatory Visit (HOSPITAL_BASED_OUTPATIENT_CLINIC_OR_DEPARTMENT_OTHER): Payer: Self-pay

## 2022-12-18 ENCOUNTER — Other Ambulatory Visit: Payer: Self-pay

## 2022-12-18 ENCOUNTER — Other Ambulatory Visit (HOSPITAL_BASED_OUTPATIENT_CLINIC_OR_DEPARTMENT_OTHER): Payer: Self-pay

## 2022-12-19 ENCOUNTER — Other Ambulatory Visit (HOSPITAL_BASED_OUTPATIENT_CLINIC_OR_DEPARTMENT_OTHER): Payer: Self-pay

## 2023-01-03 ENCOUNTER — Other Ambulatory Visit (HOSPITAL_COMMUNITY): Payer: Self-pay

## 2023-01-03 ENCOUNTER — Other Ambulatory Visit (HOSPITAL_BASED_OUTPATIENT_CLINIC_OR_DEPARTMENT_OTHER): Payer: Self-pay

## 2023-01-03 MED ORDER — METHOTREXATE SODIUM CHEMO INJECTION 50 MG/2ML
25.0000 mg | INTRAMUSCULAR | 1 refills | Status: DC
  Filled 2023-01-03: qty 10, 70d supply, fill #0
  Filled 2023-10-08: qty 10, 70d supply, fill #1

## 2023-01-08 ENCOUNTER — Other Ambulatory Visit (HOSPITAL_BASED_OUTPATIENT_CLINIC_OR_DEPARTMENT_OTHER): Payer: Self-pay

## 2023-01-08 MED ORDER — PREDNISONE 5 MG PO TABS
5.0000 mg | ORAL_TABLET | Freq: Every day | ORAL | 1 refills | Status: DC
  Filled 2023-01-08: qty 90, 90d supply, fill #0
  Filled 2023-04-01 – 2023-04-03 (×2): qty 90, 90d supply, fill #1

## 2023-01-11 ENCOUNTER — Other Ambulatory Visit (HOSPITAL_BASED_OUTPATIENT_CLINIC_OR_DEPARTMENT_OTHER): Payer: Self-pay

## 2023-01-11 ENCOUNTER — Other Ambulatory Visit (HOSPITAL_COMMUNITY): Payer: Self-pay

## 2023-01-18 ENCOUNTER — Other Ambulatory Visit (HOSPITAL_COMMUNITY): Payer: Self-pay

## 2023-01-18 ENCOUNTER — Other Ambulatory Visit: Payer: Self-pay

## 2023-01-18 ENCOUNTER — Other Ambulatory Visit: Payer: Self-pay | Admitting: Pharmacist

## 2023-01-18 MED ORDER — ORENCIA CLICKJECT 125 MG/ML ~~LOC~~ SOAJ: SUBCUTANEOUS | 4 refills | Status: DC

## 2023-01-18 MED ORDER — ORENCIA CLICKJECT 125 MG/ML ~~LOC~~ SOAJ
SUBCUTANEOUS | 4 refills | Status: DC
  Filled 2023-01-18: qty 4, 28d supply, fill #0
  Filled 2023-02-13: qty 4, 28d supply, fill #1
  Filled 2023-03-14: qty 4, 28d supply, fill #2
  Filled 2023-04-20: qty 4, 28d supply, fill #3
  Filled 2023-05-18: qty 4, 28d supply, fill #4

## 2023-01-22 ENCOUNTER — Other Ambulatory Visit: Payer: Self-pay

## 2023-01-23 ENCOUNTER — Other Ambulatory Visit (HOSPITAL_BASED_OUTPATIENT_CLINIC_OR_DEPARTMENT_OTHER): Payer: Self-pay

## 2023-01-24 ENCOUNTER — Other Ambulatory Visit (HOSPITAL_BASED_OUTPATIENT_CLINIC_OR_DEPARTMENT_OTHER): Payer: Self-pay

## 2023-01-29 DIAGNOSIS — H179 Unspecified corneal scar and opacity: Secondary | ICD-10-CM | POA: Diagnosis not present

## 2023-01-29 DIAGNOSIS — H16403 Unspecified corneal neovascularization, bilateral: Secondary | ICD-10-CM | POA: Diagnosis not present

## 2023-01-29 DIAGNOSIS — H18463 Peripheral corneal degeneration, bilateral: Secondary | ICD-10-CM | POA: Diagnosis not present

## 2023-02-13 ENCOUNTER — Other Ambulatory Visit: Payer: Self-pay

## 2023-02-16 ENCOUNTER — Other Ambulatory Visit: Payer: Self-pay

## 2023-02-19 ENCOUNTER — Other Ambulatory Visit (HOSPITAL_BASED_OUTPATIENT_CLINIC_OR_DEPARTMENT_OTHER): Payer: Self-pay

## 2023-02-19 ENCOUNTER — Other Ambulatory Visit: Payer: Self-pay | Admitting: Sports Medicine

## 2023-02-19 DIAGNOSIS — E782 Mixed hyperlipidemia: Secondary | ICD-10-CM

## 2023-02-19 MED ORDER — FENOFIBRATE 160 MG PO TABS
160.0000 mg | ORAL_TABLET | Freq: Every day | ORAL | 3 refills | Status: DC
Start: 2023-02-19 — End: 2024-02-05
  Filled 2023-02-19: qty 90, 90d supply, fill #0
  Filled 2023-05-16: qty 90, 90d supply, fill #1
  Filled 2023-08-14: qty 90, 90d supply, fill #2
  Filled 2023-11-12: qty 90, 90d supply, fill #3

## 2023-03-05 ENCOUNTER — Other Ambulatory Visit: Payer: Self-pay | Admitting: Sports Medicine

## 2023-03-05 DIAGNOSIS — E782 Mixed hyperlipidemia: Secondary | ICD-10-CM

## 2023-03-07 ENCOUNTER — Other Ambulatory Visit (HOSPITAL_BASED_OUTPATIENT_CLINIC_OR_DEPARTMENT_OTHER): Payer: Self-pay

## 2023-03-12 ENCOUNTER — Other Ambulatory Visit (HOSPITAL_BASED_OUTPATIENT_CLINIC_OR_DEPARTMENT_OTHER): Payer: Self-pay

## 2023-03-12 ENCOUNTER — Other Ambulatory Visit: Payer: Self-pay | Admitting: Sports Medicine

## 2023-03-12 DIAGNOSIS — E785 Hyperlipidemia, unspecified: Secondary | ICD-10-CM

## 2023-03-12 MED ORDER — ATORVASTATIN CALCIUM 40 MG PO TABS
40.0000 mg | ORAL_TABLET | Freq: Every day | ORAL | 3 refills | Status: DC
Start: 2023-03-12 — End: 2024-03-03
  Filled 2023-03-12: qty 90, 90d supply, fill #0
  Filled 2023-06-13: qty 90, 90d supply, fill #1
  Filled 2023-09-09: qty 90, 90d supply, fill #2
  Filled 2023-12-09: qty 90, 90d supply, fill #3

## 2023-03-13 ENCOUNTER — Other Ambulatory Visit (HOSPITAL_BASED_OUTPATIENT_CLINIC_OR_DEPARTMENT_OTHER): Payer: Self-pay

## 2023-03-13 DIAGNOSIS — M0589 Other rheumatoid arthritis with rheumatoid factor of multiple sites: Secondary | ICD-10-CM | POA: Diagnosis not present

## 2023-03-14 ENCOUNTER — Other Ambulatory Visit (HOSPITAL_COMMUNITY): Payer: Self-pay

## 2023-03-19 ENCOUNTER — Other Ambulatory Visit (HOSPITAL_COMMUNITY): Payer: Self-pay

## 2023-03-20 ENCOUNTER — Other Ambulatory Visit (HOSPITAL_COMMUNITY): Payer: Self-pay

## 2023-03-21 ENCOUNTER — Other Ambulatory Visit (HOSPITAL_BASED_OUTPATIENT_CLINIC_OR_DEPARTMENT_OTHER): Payer: Self-pay

## 2023-03-22 ENCOUNTER — Other Ambulatory Visit: Payer: Self-pay | Admitting: Sports Medicine

## 2023-03-22 ENCOUNTER — Other Ambulatory Visit (HOSPITAL_BASED_OUTPATIENT_CLINIC_OR_DEPARTMENT_OTHER): Payer: Self-pay

## 2023-03-22 DIAGNOSIS — E782 Mixed hyperlipidemia: Secondary | ICD-10-CM

## 2023-03-22 MED ORDER — OMEGA-3-ACID ETHYL ESTERS 1 G PO CAPS
2.0000 | ORAL_CAPSULE | Freq: Two times a day (BID) | ORAL | 1 refills | Status: DC
Start: 2023-03-22 — End: 2023-09-17
  Filled 2023-03-22: qty 360, 90d supply, fill #0
  Filled 2023-06-19: qty 360, 90d supply, fill #1

## 2023-03-25 ENCOUNTER — Other Ambulatory Visit (HOSPITAL_BASED_OUTPATIENT_CLINIC_OR_DEPARTMENT_OTHER): Payer: Self-pay

## 2023-03-26 ENCOUNTER — Other Ambulatory Visit (HOSPITAL_BASED_OUTPATIENT_CLINIC_OR_DEPARTMENT_OTHER): Payer: Self-pay

## 2023-03-26 MED ORDER — FOLIC ACID 1 MG PO TABS
1.0000 mg | ORAL_TABLET | Freq: Every day | ORAL | 3 refills | Status: DC
  Filled 2023-03-26: qty 90, 90d supply, fill #0
  Filled 2023-06-19: qty 90, 90d supply, fill #1
  Filled 2023-09-17: qty 90, 90d supply, fill #2
  Filled 2023-12-17: qty 90, 90d supply, fill #3

## 2023-03-29 ENCOUNTER — Other Ambulatory Visit (HOSPITAL_BASED_OUTPATIENT_CLINIC_OR_DEPARTMENT_OTHER): Payer: Self-pay

## 2023-03-29 MED ORDER — DOXYCYCLINE MONOHYDRATE 50 MG PO TABS
50.0000 mg | ORAL_TABLET | Freq: Every day | ORAL | 2 refills | Status: DC
  Filled 2023-03-29: qty 60, 60d supply, fill #0
  Filled 2023-05-28: qty 60, 60d supply, fill #1
  Filled 2023-07-25: qty 60, 60d supply, fill #2

## 2023-04-02 ENCOUNTER — Other Ambulatory Visit (HOSPITAL_BASED_OUTPATIENT_CLINIC_OR_DEPARTMENT_OTHER): Payer: Self-pay

## 2023-04-03 ENCOUNTER — Other Ambulatory Visit (HOSPITAL_BASED_OUTPATIENT_CLINIC_OR_DEPARTMENT_OTHER): Payer: Self-pay

## 2023-04-06 ENCOUNTER — Other Ambulatory Visit: Payer: Self-pay

## 2023-04-18 ENCOUNTER — Other Ambulatory Visit (HOSPITAL_COMMUNITY): Payer: Self-pay

## 2023-04-20 ENCOUNTER — Other Ambulatory Visit (HOSPITAL_COMMUNITY): Payer: Self-pay

## 2023-04-23 ENCOUNTER — Other Ambulatory Visit (HOSPITAL_COMMUNITY): Payer: Self-pay

## 2023-05-07 ENCOUNTER — Other Ambulatory Visit: Payer: Self-pay | Admitting: Sports Medicine

## 2023-05-07 DIAGNOSIS — E782 Mixed hyperlipidemia: Secondary | ICD-10-CM

## 2023-05-08 ENCOUNTER — Other Ambulatory Visit (HOSPITAL_BASED_OUTPATIENT_CLINIC_OR_DEPARTMENT_OTHER): Payer: Self-pay

## 2023-05-08 MED ORDER — NIACIN ER 500 MG PO TBCR
500.0000 mg | EXTENDED_RELEASE_TABLET | Freq: Every day | ORAL | 0 refills | Status: DC
Start: 2023-05-08 — End: 2023-08-21
  Filled 2023-05-08: qty 100, 100d supply, fill #0

## 2023-05-09 ENCOUNTER — Other Ambulatory Visit (HOSPITAL_BASED_OUTPATIENT_CLINIC_OR_DEPARTMENT_OTHER): Payer: Self-pay

## 2023-05-16 ENCOUNTER — Other Ambulatory Visit (HOSPITAL_COMMUNITY): Payer: Self-pay

## 2023-05-18 ENCOUNTER — Other Ambulatory Visit (HOSPITAL_COMMUNITY): Payer: Self-pay

## 2023-05-25 ENCOUNTER — Other Ambulatory Visit (HOSPITAL_COMMUNITY): Payer: Self-pay

## 2023-05-30 ENCOUNTER — Other Ambulatory Visit: Payer: Self-pay | Admitting: Sports Medicine

## 2023-05-30 ENCOUNTER — Other Ambulatory Visit (HOSPITAL_BASED_OUTPATIENT_CLINIC_OR_DEPARTMENT_OTHER): Payer: Self-pay

## 2023-05-30 DIAGNOSIS — E782 Mixed hyperlipidemia: Secondary | ICD-10-CM

## 2023-05-30 MED ORDER — REPATHA SURECLICK 140 MG/ML ~~LOC~~ SOAJ
140.0000 mg | SUBCUTANEOUS | 11 refills | Status: DC
Start: 2023-05-30 — End: 2024-06-06
  Filled 2023-05-30: qty 6, fill #0
  Filled 2023-06-19: qty 6, 84d supply, fill #0
  Filled 2023-09-04: qty 6, 84d supply, fill #1
  Filled 2023-12-10: qty 6, 84d supply, fill #2
  Filled 2024-03-05 (×3): qty 2, 28d supply, fill #3
  Filled 2024-05-14: qty 2, 28d supply, fill #4

## 2023-06-07 ENCOUNTER — Other Ambulatory Visit: Payer: Self-pay

## 2023-06-12 DIAGNOSIS — M1991 Primary osteoarthritis, unspecified site: Secondary | ICD-10-CM | POA: Diagnosis not present

## 2023-06-12 DIAGNOSIS — R5382 Chronic fatigue, unspecified: Secondary | ICD-10-CM | POA: Diagnosis not present

## 2023-06-12 DIAGNOSIS — H16002 Unspecified corneal ulcer, left eye: Secondary | ICD-10-CM | POA: Diagnosis not present

## 2023-06-12 DIAGNOSIS — M79671 Pain in right foot: Secondary | ICD-10-CM | POA: Diagnosis not present

## 2023-06-12 DIAGNOSIS — M0589 Other rheumatoid arthritis with rheumatoid factor of multiple sites: Secondary | ICD-10-CM | POA: Diagnosis not present

## 2023-06-12 DIAGNOSIS — Z6830 Body mass index (BMI) 30.0-30.9, adult: Secondary | ICD-10-CM | POA: Diagnosis not present

## 2023-06-12 DIAGNOSIS — E669 Obesity, unspecified: Secondary | ICD-10-CM | POA: Diagnosis not present

## 2023-06-13 ENCOUNTER — Other Ambulatory Visit (HOSPITAL_BASED_OUTPATIENT_CLINIC_OR_DEPARTMENT_OTHER): Payer: Self-pay

## 2023-06-18 ENCOUNTER — Other Ambulatory Visit (HOSPITAL_COMMUNITY): Payer: Self-pay

## 2023-06-18 ENCOUNTER — Other Ambulatory Visit: Payer: Self-pay

## 2023-06-18 MED ORDER — ORENCIA CLICKJECT 125 MG/ML ~~LOC~~ SOAJ: SUBCUTANEOUS | 4 refills | Status: DC

## 2023-06-19 ENCOUNTER — Other Ambulatory Visit (HOSPITAL_BASED_OUTPATIENT_CLINIC_OR_DEPARTMENT_OTHER): Payer: Self-pay

## 2023-06-19 ENCOUNTER — Other Ambulatory Visit (HOSPITAL_COMMUNITY): Payer: Self-pay

## 2023-06-19 ENCOUNTER — Other Ambulatory Visit: Payer: Self-pay

## 2023-06-19 ENCOUNTER — Other Ambulatory Visit: Payer: Self-pay | Admitting: Pharmacist

## 2023-06-19 MED ORDER — ORENCIA CLICKJECT 125 MG/ML ~~LOC~~ SOAJ
SUBCUTANEOUS | 4 refills | Status: DC
  Filled 2023-06-19: qty 4, 28d supply, fill #0
  Filled 2023-07-20: qty 4, 28d supply, fill #1
  Filled 2023-08-24: qty 4, 28d supply, fill #2
  Filled 2023-09-25: qty 4, 28d supply, fill #3
  Filled 2023-10-12: qty 4, 28d supply, fill #4

## 2023-06-22 ENCOUNTER — Other Ambulatory Visit (HOSPITAL_BASED_OUTPATIENT_CLINIC_OR_DEPARTMENT_OTHER): Payer: Self-pay

## 2023-06-25 ENCOUNTER — Other Ambulatory Visit (HOSPITAL_COMMUNITY): Payer: Self-pay

## 2023-06-25 ENCOUNTER — Other Ambulatory Visit: Payer: Self-pay

## 2023-06-26 ENCOUNTER — Other Ambulatory Visit: Payer: Self-pay

## 2023-06-30 ENCOUNTER — Encounter (HOSPITAL_COMMUNITY): Payer: Self-pay

## 2023-07-02 ENCOUNTER — Other Ambulatory Visit (HOSPITAL_BASED_OUTPATIENT_CLINIC_OR_DEPARTMENT_OTHER): Payer: Self-pay

## 2023-07-02 MED ORDER — PREDNISONE 5 MG PO TABS
5.0000 mg | ORAL_TABLET | Freq: Every day | ORAL | 1 refills | Status: DC
  Filled 2023-07-02: qty 90, 90d supply, fill #0
  Filled 2023-10-05: qty 90, 90d supply, fill #1

## 2023-07-20 ENCOUNTER — Encounter (HOSPITAL_COMMUNITY): Payer: Self-pay

## 2023-07-20 ENCOUNTER — Other Ambulatory Visit: Payer: Self-pay

## 2023-07-20 NOTE — Progress Notes (Signed)
Specialty Pharmacy Ongoing Clinical Assessment Note  Todd Valdez is a 62 y.o. male who is being followed by the specialty pharmacy service for RxSp Rheumatoid Arthritis   Patient's specialty medication(s) reviewed today: Abatacept   Missed doses in the last 4 weeks: 0   Patient/Caregiver did not have any additional questions or concerns.   Therapeutic benefit summary: Patient is achieving benefit   Adverse events/side effects summary: No adverse events/side effects   Patient's therapy is appropriate to: Continue    Goals Addressed             This Visit's Progress    Reduce signs and symptoms       Patient is on track. Patient will maintain adherence         Follow up:  6 months  Otto Herb Specialty Pharmacist

## 2023-07-20 NOTE — Progress Notes (Signed)
Specialty Pharmacy Refill Coordination Note  Todd Valdez is a 61 y.o. male contacted today regarding refills of specialty medication(s) Abatacept   Patient requested Delivery   Delivery date: 08/01/23   Verified address: [address]  [place] [address]   Medication will be filled on 07/31/23.

## 2023-07-25 ENCOUNTER — Other Ambulatory Visit (HOSPITAL_BASED_OUTPATIENT_CLINIC_OR_DEPARTMENT_OTHER): Payer: Self-pay

## 2023-07-25 ENCOUNTER — Other Ambulatory Visit: Payer: Self-pay

## 2023-08-06 DIAGNOSIS — M069 Rheumatoid arthritis, unspecified: Secondary | ICD-10-CM | POA: Diagnosis not present

## 2023-08-06 DIAGNOSIS — H18463 Peripheral corneal degeneration, bilateral: Secondary | ICD-10-CM | POA: Diagnosis not present

## 2023-08-06 DIAGNOSIS — H16403 Unspecified corneal neovascularization, bilateral: Secondary | ICD-10-CM | POA: Diagnosis not present

## 2023-08-06 DIAGNOSIS — H179 Unspecified corneal scar and opacity: Secondary | ICD-10-CM | POA: Diagnosis not present

## 2023-08-21 ENCOUNTER — Other Ambulatory Visit (HOSPITAL_COMMUNITY): Payer: Self-pay

## 2023-08-21 ENCOUNTER — Other Ambulatory Visit (HOSPITAL_BASED_OUTPATIENT_CLINIC_OR_DEPARTMENT_OTHER): Payer: Self-pay

## 2023-08-21 ENCOUNTER — Other Ambulatory Visit: Payer: Self-pay | Admitting: Sports Medicine

## 2023-08-21 DIAGNOSIS — E782 Mixed hyperlipidemia: Secondary | ICD-10-CM

## 2023-08-21 MED ORDER — NIACIN ER 500 MG PO TBCR
500.0000 mg | EXTENDED_RELEASE_TABLET | Freq: Every day | ORAL | 0 refills | Status: AC
Start: 2023-08-21 — End: ?
  Filled 2023-08-21: qty 100, 100d supply, fill #0

## 2023-08-22 ENCOUNTER — Other Ambulatory Visit (HOSPITAL_BASED_OUTPATIENT_CLINIC_OR_DEPARTMENT_OTHER): Payer: Self-pay

## 2023-08-24 ENCOUNTER — Other Ambulatory Visit: Payer: Self-pay

## 2023-08-24 NOTE — Progress Notes (Signed)
Specialty Pharmacy Refill Coordination Note  Todd Valdez is a 62 y.o. male contacted today regarding refills of specialty medication(s) Abatacept   Patient requested Delivery   Delivery date: 08/29/23   Verified address: [address] [place],[id number]   Medication will be filled on 08/28/23.

## 2023-08-28 ENCOUNTER — Other Ambulatory Visit: Payer: Self-pay

## 2023-08-29 ENCOUNTER — Other Ambulatory Visit (HOSPITAL_COMMUNITY): Payer: Self-pay

## 2023-09-04 ENCOUNTER — Other Ambulatory Visit (HOSPITAL_BASED_OUTPATIENT_CLINIC_OR_DEPARTMENT_OTHER): Payer: Self-pay

## 2023-09-09 ENCOUNTER — Other Ambulatory Visit (HOSPITAL_BASED_OUTPATIENT_CLINIC_OR_DEPARTMENT_OTHER): Payer: Self-pay

## 2023-09-09 ENCOUNTER — Other Ambulatory Visit: Payer: Self-pay | Admitting: Sports Medicine

## 2023-09-09 MED ORDER — ESOMEPRAZOLE MAGNESIUM 40 MG PO CPDR
40.0000 mg | DELAYED_RELEASE_CAPSULE | Freq: Every day | ORAL | 3 refills | Status: DC
  Filled 2023-09-09: qty 75, 75d supply, fill #0
  Filled 2023-11-22: qty 75, 75d supply, fill #1
  Filled 2024-01-28: qty 75, 75d supply, fill #2
  Filled 2024-04-08: qty 75, 75d supply, fill #3

## 2023-09-10 ENCOUNTER — Other Ambulatory Visit (HOSPITAL_BASED_OUTPATIENT_CLINIC_OR_DEPARTMENT_OTHER): Payer: Self-pay

## 2023-09-11 DIAGNOSIS — M0589 Other rheumatoid arthritis with rheumatoid factor of multiple sites: Secondary | ICD-10-CM | POA: Diagnosis not present

## 2023-09-17 ENCOUNTER — Other Ambulatory Visit: Payer: Self-pay | Admitting: Sports Medicine

## 2023-09-17 ENCOUNTER — Other Ambulatory Visit (HOSPITAL_BASED_OUTPATIENT_CLINIC_OR_DEPARTMENT_OTHER): Payer: Self-pay

## 2023-09-17 ENCOUNTER — Other Ambulatory Visit: Payer: Self-pay

## 2023-09-17 DIAGNOSIS — E782 Mixed hyperlipidemia: Secondary | ICD-10-CM

## 2023-09-17 MED ORDER — OMEGA-3-ACID ETHYL ESTERS 1 G PO CAPS
2.0000 | ORAL_CAPSULE | Freq: Two times a day (BID) | ORAL | 1 refills | Status: DC
Start: 2023-09-17 — End: 2024-03-12
  Filled 2023-09-17 (×2): qty 360, 90d supply, fill #0
  Filled 2023-12-17: qty 360, 90d supply, fill #1

## 2023-09-21 ENCOUNTER — Other Ambulatory Visit (HOSPITAL_BASED_OUTPATIENT_CLINIC_OR_DEPARTMENT_OTHER): Payer: Self-pay

## 2023-09-25 ENCOUNTER — Other Ambulatory Visit (HOSPITAL_COMMUNITY): Payer: Self-pay

## 2023-09-25 NOTE — Progress Notes (Signed)
Specialty Pharmacy Refill Coordination Note  Todd Valdez is a 62 y.o. male contacted today regarding refills of specialty medication(s) Abatacept (Orencia ClickJect)   Patient requested Delivery   Delivery date: 09/28/23   Verified address: [address]  [place] [address]   Medication will be filled on 09/27/23.

## 2023-09-27 ENCOUNTER — Other Ambulatory Visit: Payer: Self-pay

## 2023-10-01 ENCOUNTER — Ambulatory Visit: Payer: Commercial Managed Care - PPO | Attending: Sports Medicine | Admitting: Pharmacist

## 2023-10-01 DIAGNOSIS — Z79899 Other long term (current) drug therapy: Secondary | ICD-10-CM

## 2023-10-01 NOTE — Progress Notes (Signed)
  S: Patient presents for review of their specialty medication therapy.  Patient is currently taking Orencia for rheumatoid arthritis. Patient is managed by Dr. Dierdre Forth for this.   Adherence: denies any missed doses.   Efficacy: reports that it is still working very well for him  FDA-approved dosing: 125 mg once weekly   Drug-drug interactions: none  Screenings: TB screening: completed per patient Hepatitis Screening: completed per patient Blood glucose: Orencia contains maltose which make falsely elevate glucose levels  Monitoring: S/sx of infection: denies.  S/sx of hypersensitivity: denies  Other adverse effects: denies  O:  Lab Results  Component Value Date   WBC 11.1 (H) 11/08/2022   HGB 14.0 11/08/2022   HCT 40.5 11/08/2022   MCV 91.6 11/08/2022   PLT 233 11/08/2022      Chemistry      Component Value Date/Time   NA 139 11/08/2022 0809   K 4.3 11/08/2022 0809   CL 105 11/08/2022 0809   CO2 24 11/08/2022 0809   BUN 24 11/08/2022 0809   CREATININE 1.11 11/08/2022 0809      Component Value Date/Time   CALCIUM 10.2 11/08/2022 0809   ALKPHOS 78 09/25/2016 0530   AST 14 11/08/2022 0809   ALT 16 11/08/2022 0809   BILITOT 0.6 11/08/2022 0809       A/P: 1. Medication review: Patient currently on Orencia for the treatment of rheumatoid arthritis and is tolerating it well with continued improved control of RA. Reviewed the medication with the patient, including the following: Orencia is a selective T-cell costimulation blocker indicated for rheumatoid arthritis. The most common adverse effects are infections, headache, and injection site reactions. There is a possible adverse effect of increased risk of malignancy but it is not fully understood if this is due to the drug or the disease state itself. The patient was instructed to avoid use of live vaccinations without the approval of a physician. No recommendations for any changes.  Butch Penny, PharmD, [place],  CPP Clinical Pharmacist [facility] [phone]

## 2023-10-05 ENCOUNTER — Other Ambulatory Visit (HOSPITAL_BASED_OUTPATIENT_CLINIC_OR_DEPARTMENT_OTHER): Payer: Self-pay

## 2023-10-08 ENCOUNTER — Other Ambulatory Visit (HOSPITAL_BASED_OUTPATIENT_CLINIC_OR_DEPARTMENT_OTHER): Payer: Self-pay

## 2023-10-09 ENCOUNTER — Other Ambulatory Visit (HOSPITAL_BASED_OUTPATIENT_CLINIC_OR_DEPARTMENT_OTHER): Payer: Self-pay

## 2023-10-12 ENCOUNTER — Other Ambulatory Visit: Payer: Self-pay

## 2023-10-12 ENCOUNTER — Other Ambulatory Visit (HOSPITAL_BASED_OUTPATIENT_CLINIC_OR_DEPARTMENT_OTHER): Payer: Self-pay

## 2023-10-12 MED ORDER — DOXYCYCLINE MONOHYDRATE 50 MG PO TABS
50.0000 mg | ORAL_TABLET | Freq: Every day | ORAL | 2 refills | Status: DC
  Filled 2023-10-12: qty 60, 60d supply, fill #0
  Filled 2023-12-10: qty 60, 60d supply, fill #1
  Filled 2024-02-06: qty 60, 60d supply, fill #2

## 2023-10-12 NOTE — Progress Notes (Signed)
 Specialty Pharmacy Refill Coordination Note  Todd Valdez is a 63 y.o. male contacted today regarding refills of specialty medication(s) Abatacept  (Orencia  ClickJect)   Patient requested Delivery   Delivery date: 10/23/23   Verified address: [address]   [name] [id number]   Medication will be filled on 10/22/23.

## 2023-10-15 ENCOUNTER — Other Ambulatory Visit (HOSPITAL_BASED_OUTPATIENT_CLINIC_OR_DEPARTMENT_OTHER): Payer: Self-pay

## 2023-10-17 ENCOUNTER — Other Ambulatory Visit: Payer: Self-pay

## 2023-10-22 ENCOUNTER — Encounter: Payer: Self-pay | Admitting: Sports Medicine

## 2023-10-22 ENCOUNTER — Other Ambulatory Visit (HOSPITAL_BASED_OUTPATIENT_CLINIC_OR_DEPARTMENT_OTHER): Payer: Self-pay

## 2023-10-22 ENCOUNTER — Ambulatory Visit (INDEPENDENT_AMBULATORY_CARE_PROVIDER_SITE_OTHER): Payer: Commercial Managed Care - PPO | Admitting: Sports Medicine

## 2023-10-22 ENCOUNTER — Ambulatory Visit: Payer: Commercial Managed Care - PPO

## 2023-10-22 ENCOUNTER — Other Ambulatory Visit: Payer: Self-pay

## 2023-10-22 VITALS — BP 167/98 | HR 90 | Ht 73.0 in | Wt 214.0 lb

## 2023-10-22 DIAGNOSIS — E782 Mixed hyperlipidemia: Secondary | ICD-10-CM

## 2023-10-22 DIAGNOSIS — M069 Rheumatoid arthritis, unspecified: Secondary | ICD-10-CM

## 2023-10-22 DIAGNOSIS — R053 Chronic cough: Secondary | ICD-10-CM

## 2023-10-22 DIAGNOSIS — R972 Elevated prostate specific antigen [PSA]: Secondary | ICD-10-CM | POA: Diagnosis not present

## 2023-10-22 DIAGNOSIS — K219 Gastro-esophageal reflux disease without esophagitis: Secondary | ICD-10-CM | POA: Diagnosis not present

## 2023-10-22 DIAGNOSIS — I10 Essential (primary) hypertension: Secondary | ICD-10-CM | POA: Diagnosis not present

## 2023-10-22 DIAGNOSIS — J40 Bronchitis, not specified as acute or chronic: Secondary | ICD-10-CM | POA: Diagnosis not present

## 2023-10-22 DIAGNOSIS — Z0000 Encounter for general adult medical examination without abnormal findings: Secondary | ICD-10-CM | POA: Diagnosis not present

## 2023-10-22 DIAGNOSIS — N139 Obstructive and reflux uropathy, unspecified: Secondary | ICD-10-CM | POA: Diagnosis not present

## 2023-10-22 MED ORDER — PREDNISONE 50 MG PO TABS
50.0000 mg | ORAL_TABLET | Freq: Every day | ORAL | 0 refills | Status: DC
  Filled 2023-10-22: qty 5, 5d supply, fill #0

## 2023-10-22 MED ORDER — AZITHROMYCIN 250 MG PO TABS
ORAL_TABLET | ORAL | 0 refills | Status: DC
  Filled 2023-10-22: qty 6, 5d supply, fill #0

## 2023-10-22 MED ORDER — FLUTICASONE PROPIONATE 50 MCG/ACT NA SUSP
1.0000 | Freq: Two times a day (BID) | NASAL | 3 refills | Status: DC
  Filled 2023-10-22: qty 48, 90d supply, fill #0

## 2023-10-22 NOTE — Assessment & Plan Note (Signed)
 Blood pressure significantly elevated today but patient is taking Alka-Seltzer cold plus which does have phenylephrine in it. We will recheck this again at his follow-up.

## 2023-10-22 NOTE — Assessment & Plan Note (Signed)
 Persistent chronic cough, historically well-controlled with a PPI. Adding some home dietary changes, continue this for now.

## 2023-10-22 NOTE — Assessment & Plan Note (Signed)
 Persistent chronic cough, he did have a pneumonia last year. Cough has recurred, adding prednisone  and azithromycin , Flonase . I explained him the common etiologies including postnasal drip syndrome and acid reflux. We will treat all of the above, he will return to see me in about a month and if persistent coughing we will need to investigate more aggressively with likely cross-sectional imaging.

## 2023-10-22 NOTE — Assessment & Plan Note (Signed)
 Fasting annual physical as above, up-to-date on flu shot, declines Shingrix. Routine labs obtained. Return to see me in a year for this.

## 2023-10-22 NOTE — Progress Notes (Addendum)
 Subjective:    CC: Annual Physical Exam  HPI:  This patient is here for their annual physical  I reviewed the past medical history, family history, social history, surgical history, and allergies today and no changes were needed.  Please see the problem list section below in epic for further details.  Past Medical History: Past Medical History:  Diagnosis Date   Arthritis    Cancer (HCC)    Melanoma on  back   Diverticulitis    GERD (gastroesophageal reflux disease)    High triglycerides    History of abscess of skin and subcutaneous tissue    right forearm, chin   Hypertension    RA (rheumatoid arthritis) (HCC)    Reflux esophagitis    Right inguinal hernia    Sleep apnea    no cpap   Past Surgical History: Past Surgical History:  Procedure Laterality Date   bilateral feet reconstruction  10/2000   COLONOSCOPY     feet surgery     INGUINAL HERNIA REPAIR Right 11/27/2017   Procedure: OPEN RIGHT INGUINAL HERNIA REPAIR;  Surgeon: Eletha Boas, MD;  Location: [facility];  Service: General;  Laterality: Right;   INSERTION OF MESH Right 11/27/2017   Procedure: INSERTION OF MESH;  Surgeon: Eletha Boas, MD;  Location: [facility];  Service: General;  Laterality: Right;   MELANOMA EXCISION     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   Procedure: Right transmetatarsal amputation and heelcord lengthening;  Surgeon: Kit Rush, MD;  Location: [facility];  Service: Orthopedics;  Laterality: Right;   UPPER GI ENDOSCOPY  12/2011   Social History: Social History   Socioeconomic History   Marital status: Married    Spouse name: Not on file   Number of children: Not on file   Years of education: Not on file   Highest education level: Not on file  Occupational History   Not on file  Tobacco Use   Smoking status: Former    Current packs/day: 0.00    Types: Cigarettes    Quit date: 02/07/1991    Years since quitting: 32.7    Smokeless tobacco: Former    Quit date: 08/14/2009  Vaping Use   Vaping status: Never Used  Substance and Sexual Activity   Alcohol use: No   Drug use: No   Sexual activity: Yes    Partners: Female    Comment: married.   Other Topics Concern   Not on file  Social History Narrative   Not on file   Social Drivers of Health   Financial Resource Strain: Not on file  Food Insecurity: Not on file  Transportation Needs: Not on file  Physical Activity: Not on file  Stress: Not on file  Social Connections: Unknown (02/17/2022)   Received from [facility]   Social Network    Social Network: Not on file   Family History: Family History  Problem Relation Age of Onset   Hypertension Mother    Hypertension Father    Allergies: No Known Allergies Medications: See med rec.  Review of Systems: No headache, visual changes, nausea, vomiting, diarrhea, constipation, dizziness, abdominal pain, skin rash, fevers, chills, night sweats, swollen lymph nodes, weight loss, chest pain, body aches, joint swelling, muscle aches, shortness of breath, mood changes, visual or auditory hallucinations.  Objective:    General: Well Developed, well nourished, and in no acute distress.  Neuro: Alert and oriented x3, extra-ocular muscles intact, sensation grossly intact. Cranial  nerves II through XII are intact, motor, sensory, and coordinative functions are all intact. HEENT: Normocephalic, atraumatic, pupils equal round reactive to light, neck supple, no masses, no lymphadenopathy, thyroid  nonpalpable. Nasopharynx, external ear canals are unremarkable.  Minimal posterior pharyngeal erythema likely related to postnasal drip syndrome. Skin: Warm and dry, no rashes noted.  Cardiac: Regular rate and rhythm, no murmurs rubs or gallops.  Respiratory: Clear to auscultation bilaterally. Not using accessory muscles, speaking in full sentences.  Abdominal: Soft, nontender, nondistended, positive bowel  sounds, no masses, no organomegaly.  Musculoskeletal: Classic hand deformity associated with rheumatoid arthritis.  Impression and Recommendations:    The patient was counselled, risk factors were discussed, anticipatory guidance given.  Annual physical exam Fasting annual physical as above, up-to-date on flu shot, declines Shingrix. Routine labs obtained. Return to see me in a year for this.  LPRD (laryngopharyngeal reflux disease) Persistent chronic cough, historically well-controlled with a PPI. Adding some home dietary changes, continue this for now.  Coughing Persistent chronic cough, he did have a pneumonia last year. Cough has recurred, adding prednisone  and azithromycin , Flonase . I explained him the common etiologies including postnasal drip syndrome and acid reflux. We will treat all of the above, he will return to see me in about a month and if persistent coughing we will need to investigate more aggressively with likely cross-sectional imaging.  Hypertension Blood pressure significantly elevated today but patient is taking Alka-Seltzer cold plus which does have phenylephrine  in it. We will recheck this again at his follow-up.  Elevated PSA Mildly elevated PSA, rechecking in 1 to 2 months to ensure stability.   ____________________________________________ Debby PARAS. Curtis, M.D., ABFM., CAQSM., AME. Primary Care and Sports Medicine [facility]  Adjunct Professor of [facility] Medicine  [employer]  Restaurant Manager, [employer]

## 2023-10-23 ENCOUNTER — Encounter: Payer: Self-pay | Admitting: Sports Medicine

## 2023-10-23 DIAGNOSIS — R972 Elevated prostate specific antigen [PSA]: Secondary | ICD-10-CM | POA: Insufficient documentation

## 2023-10-23 NOTE — Patient Instructions (Signed)

## 2023-10-23 NOTE — Assessment & Plan Note (Signed)
 Mildly elevated PSA, rechecking in 1 to 2 months to ensure stability.

## 2023-10-23 NOTE — Addendum Note (Signed)
 Addended by: Monica Becton on: 10/23/2023 10:13 AM   Modules accepted: Orders

## 2023-10-25 LAB — COMPREHENSIVE METABOLIC PANEL
ALT: 19 [IU]/L (ref 0–44)
Albumin: 4.4 g/dL (ref 3.9–4.9)
Alkaline Phosphatase: 102 [IU]/L (ref 44–121)
BUN/Creatinine Ratio: 18 (ref 10–24)
Bilirubin Total: 0.6 mg/dL (ref 0.0–1.2)
Calcium: 10.4 mg/dL — ABNORMAL HIGH (ref 8.6–10.2)
Potassium: 4.4 mmol/L (ref 3.5–5.2)
Sodium: 142 mmol/L (ref 134–144)

## 2023-10-25 LAB — COMPREHENSIVE METABOLIC PANEL WITH GFR
AST: 19 IU/L (ref 0–40)
BUN: 18 mg/dL (ref 8–27)
CO2: 21 mmol/L (ref 20–29)
Chloride: 106 mmol/L (ref 96–106)
Creatinine, Ser: 0.98 mg/dL (ref 0.76–1.27)
Globulin, Total: 2.5 g/dL (ref 1.5–4.5)
Glucose: 90 mg/dL (ref 70–99)
Total Protein: 6.9 g/dL (ref 6.0–8.5)
eGFR: 86 mL/min/1.73 (ref >59)

## 2023-10-25 LAB — CBC
Hematocrit: 47.4 % (ref 37.5–51.0)
Hemoglobin: 15.7 g/dL (ref 13.0–17.7)
MCH: 29.5 pg (ref 26.6–33.0)
MCHC: 33.1 g/dL (ref 31.5–35.7)
MCV: 89 fL (ref 79–97)
Platelets: 283 10*3/uL (ref 150–450)
RBC: 5.32 x10E6/uL (ref 4.14–5.80)
RDW: 13.8 % (ref 11.6–15.4)
WBC: 10.8 10*3/uL (ref 3.4–10.8)

## 2023-10-25 LAB — TSH: TSH: 1.62 u[IU]/mL (ref 0.450–4.500)

## 2023-10-25 LAB — QUANTIFERON-TB GOLD PLUS
QuantiFERON Mitogen Value: >10 [IU]/mL
QuantiFERON Nil Value: 0.1 [IU]/mL
QuantiFERON TB1 Ag Value: 0.07 [IU]/mL
QuantiFERON TB2 Ag Value: 0.08 [IU]/mL
QuantiFERON-TB Gold Plus: NEGATIVE

## 2023-10-25 LAB — LIPID PANEL
Chol/HDL Ratio: 2.7 {ratio} (ref 0.0–5.0)
Cholesterol, Total: 85 mg/dL — ABNORMAL LOW (ref 100–199)
HDL: 32 mg/dL — ABNORMAL LOW (ref >39)
LDL Chol Calc (NIH): 17 mg/dL (ref 0–99)
Triglycerides: 236 mg/dL — ABNORMAL HIGH (ref 0–149)
VLDL Cholesterol Cal: 36 mg/dL (ref 5–40)

## 2023-10-25 LAB — PSA, TOTAL AND FREE
PSA, Free Pct: 12.8 %
PSA, Free: 0.55 ng/mL
Prostate Specific Ag, Serum: 4.3 ng/mL — ABNORMAL HIGH (ref 0.0–4.0)

## 2023-10-25 LAB — HEMOGLOBIN A1C
Est. average glucose Bld gHb Est-mCnc: 117 mg/dL
Hgb A1c MFr Bld: 5.7 % — ABNORMAL HIGH (ref 4.8–5.6)

## 2023-11-01 ENCOUNTER — Encounter (HOSPITAL_COMMUNITY)
Admission: EM | Disposition: A | Discharge status: Home / Self Care | Payer: Self-pay | Source: Home / Self Care | Attending: Family Medicine

## 2023-11-01 ENCOUNTER — Observation Stay (HOSPITAL_COMMUNITY): Payer: Commercial Managed Care - PPO | Admitting: Registered Nurse

## 2023-11-01 ENCOUNTER — Other Ambulatory Visit: Payer: Self-pay

## 2023-11-01 ENCOUNTER — Emergency Department (HOSPITAL_BASED_OUTPATIENT_CLINIC_OR_DEPARTMENT_OTHER): Payer: Commercial Managed Care - PPO

## 2023-11-01 ENCOUNTER — Observation Stay (HOSPITAL_COMMUNITY): Payer: Commercial Managed Care - PPO

## 2023-11-01 ENCOUNTER — Encounter (HOSPITAL_BASED_OUTPATIENT_CLINIC_OR_DEPARTMENT_OTHER): Payer: Self-pay

## 2023-11-01 ENCOUNTER — Inpatient Hospital Stay (HOSPITAL_BASED_OUTPATIENT_CLINIC_OR_DEPARTMENT_OTHER)
Admission: EM | Admit: 2023-11-01 | Discharge: 2023-11-03 | DRG: 854 | Disposition: A | Discharge status: Home / Self Care | Payer: Commercial Managed Care - PPO | Attending: Family Medicine | Admitting: Family Medicine

## 2023-11-01 DIAGNOSIS — I2510 Atherosclerotic heart disease of native coronary artery without angina pectoris: Secondary | ICD-10-CM | POA: Diagnosis not present

## 2023-11-01 DIAGNOSIS — N136 Pyonephrosis: Secondary | ICD-10-CM | POA: Diagnosis not present

## 2023-11-01 DIAGNOSIS — J309 Allergic rhinitis, unspecified: Secondary | ICD-10-CM | POA: Diagnosis present

## 2023-11-01 DIAGNOSIS — I700 Atherosclerosis of aorta: Secondary | ICD-10-CM | POA: Diagnosis present

## 2023-11-01 DIAGNOSIS — Z87891 Personal history of nicotine dependence: Secondary | ICD-10-CM

## 2023-11-01 DIAGNOSIS — B9689 Other specified bacterial agents as the cause of diseases classified elsewhere: Secondary | ICD-10-CM | POA: Diagnosis not present

## 2023-11-01 DIAGNOSIS — E781 Pure hyperglyceridemia: Secondary | ICD-10-CM | POA: Diagnosis not present

## 2023-11-01 DIAGNOSIS — G4730 Sleep apnea, unspecified: Secondary | ICD-10-CM | POA: Diagnosis present

## 2023-11-01 DIAGNOSIS — E785 Hyperlipidemia, unspecified: Secondary | ICD-10-CM | POA: Diagnosis not present

## 2023-11-01 DIAGNOSIS — M48061 Spinal stenosis, lumbar region without neurogenic claudication: Secondary | ICD-10-CM | POA: Diagnosis present

## 2023-11-01 DIAGNOSIS — N3000 Acute cystitis without hematuria: Secondary | ICD-10-CM | POA: Diagnosis not present

## 2023-11-01 DIAGNOSIS — I10 Essential (primary) hypertension: Secondary | ICD-10-CM

## 2023-11-01 DIAGNOSIS — N132 Hydronephrosis with renal and ureteral calculous obstruction: Secondary | ICD-10-CM | POA: Diagnosis not present

## 2023-11-01 DIAGNOSIS — R739 Hyperglycemia, unspecified: Secondary | ICD-10-CM | POA: Diagnosis not present

## 2023-11-01 DIAGNOSIS — N3289 Other specified disorders of bladder: Secondary | ICD-10-CM

## 2023-11-01 DIAGNOSIS — K429 Umbilical hernia without obstruction or gangrene: Secondary | ICD-10-CM | POA: Diagnosis present

## 2023-11-01 DIAGNOSIS — N400 Enlarged prostate without lower urinary tract symptoms: Secondary | ICD-10-CM | POA: Diagnosis not present

## 2023-11-01 DIAGNOSIS — E872 Acidosis, unspecified: Secondary | ICD-10-CM | POA: Diagnosis present

## 2023-11-01 DIAGNOSIS — K5730 Diverticulosis of large intestine without perforation or abscess without bleeding: Secondary | ICD-10-CM | POA: Diagnosis not present

## 2023-11-01 DIAGNOSIS — M069 Rheumatoid arthritis, unspecified: Secondary | ICD-10-CM | POA: Diagnosis not present

## 2023-11-01 DIAGNOSIS — D84821 Immunodeficiency due to drugs: Secondary | ICD-10-CM | POA: Diagnosis present

## 2023-11-01 DIAGNOSIS — K4020 Bilateral inguinal hernia, without obstruction or gangrene, not specified as recurrent: Secondary | ICD-10-CM | POA: Diagnosis not present

## 2023-11-01 DIAGNOSIS — Z8249 Family history of ischemic heart disease and other diseases of the circulatory system: Secondary | ICD-10-CM

## 2023-11-01 DIAGNOSIS — K449 Diaphragmatic hernia without obstruction or gangrene: Secondary | ICD-10-CM | POA: Diagnosis not present

## 2023-11-01 DIAGNOSIS — N201 Calculus of ureter: Secondary | ICD-10-CM | POA: Diagnosis not present

## 2023-11-01 DIAGNOSIS — A419 Sepsis, unspecified organism: Secondary | ICD-10-CM | POA: Diagnosis not present

## 2023-11-01 DIAGNOSIS — Z79899 Other long term (current) drug therapy: Secondary | ICD-10-CM | POA: Diagnosis not present

## 2023-11-01 DIAGNOSIS — K2100 Gastro-esophageal reflux disease with esophagitis, without bleeding: Secondary | ICD-10-CM | POA: Diagnosis not present

## 2023-11-01 DIAGNOSIS — N390 Urinary tract infection, site not specified: Secondary | ICD-10-CM | POA: Diagnosis present

## 2023-11-01 DIAGNOSIS — Z85820 Personal history of malignant melanoma of skin: Secondary | ICD-10-CM | POA: Diagnosis not present

## 2023-11-01 DIAGNOSIS — R9431 Abnormal electrocardiogram [ECG] [EKG]: Secondary | ICD-10-CM | POA: Diagnosis present

## 2023-11-01 HISTORY — PX: CYSTOSCOPY W/ URETERAL STENT PLACEMENT: SHX1429

## 2023-11-01 LAB — URINALYSIS, MICROSCOPIC (REFLEX): WBC, UA: >50 WBC/hpf (ref 0–5)

## 2023-11-01 LAB — COMPREHENSIVE METABOLIC PANEL
ALT: 22 U/L (ref 0–44)
AST: 21 U/L (ref 15–41)
Albumin: 4.1 g/dL (ref 3.5–5.0)
Alkaline Phosphatase: 74 U/L (ref 38–126)
Anion gap: 10 (ref 5–15)
BUN: 27 mg/dL — ABNORMAL HIGH (ref 8–23)
CO2: 22 mmol/L (ref 22–32)
Calcium: 9.6 mg/dL (ref 8.9–10.3)
Chloride: 107 mmol/L (ref 98–111)
Creatinine, Ser: 1.24 mg/dL (ref 0.61–1.24)
GFR, Estimated: >60 mL/min (ref >60)
Glucose, Bld: 127 mg/dL — ABNORMAL HIGH (ref 70–99)
Potassium: 4 mmol/L (ref 3.5–5.1)
Sodium: 139 mmol/L (ref 135–145)
Total Bilirubin: 0.9 mg/dL (ref 0.0–1.2)
Total Protein: 6.9 g/dL (ref 6.5–8.1)

## 2023-11-01 LAB — CBC WITH DIFFERENTIAL/PLATELET
Abs Immature Granulocytes: 0.11 10*3/uL — ABNORMAL HIGH (ref 0.00–0.07)
Basophils Absolute: 0.1 10*3/uL (ref 0.0–0.1)
Basophils Relative: 0 %
Eosinophils Absolute: 0.6 10*3/uL — ABNORMAL HIGH (ref 0.0–0.5)
Eosinophils Relative: 3 %
HCT: 43.4 % (ref 39.0–52.0)
Hemoglobin: 14.8 g/dL (ref 13.0–17.0)
Immature Granulocytes: 1 %
Lymphocytes Relative: 13 %
Lymphs Abs: 2.9 10*3/uL (ref 0.7–4.0)
MCH: 29.9 pg (ref 26.0–34.0)
MCHC: 34.1 g/dL (ref 30.0–36.0)
MCV: 87.7 fL (ref 80.0–100.0)
Monocytes Absolute: 1.3 10*3/uL — ABNORMAL HIGH (ref 0.1–1.0)
Monocytes Relative: 6 %
Neutro Abs: 17 10*3/uL — ABNORMAL HIGH (ref 1.7–7.7)
Neutrophils Relative %: 77 %
Platelets: 289 10*3/uL (ref 150–400)
RBC: 4.95 MIL/uL (ref 4.22–5.81)
RDW: 14.5 % (ref 11.5–15.5)
WBC: 22.1 10*3/uL — ABNORMAL HIGH (ref 4.0–10.5)
nRBC: 0 % (ref 0.0–0.2)

## 2023-11-01 LAB — URINALYSIS, ROUTINE W REFLEX MICROSCOPIC
Bilirubin Urine: NEGATIVE
Glucose, UA: NEGATIVE mg/dL
Ketones, ur: NEGATIVE mg/dL
Nitrite: POSITIVE — AB
Protein, ur: NEGATIVE mg/dL
Specific Gravity, Urine: 1.015 (ref 1.005–1.030)
pH: 5.5 (ref 5.0–8.0)

## 2023-11-01 LAB — MRSA NEXT GEN BY PCR, NASAL: MRSA by PCR Next Gen: NOT DETECTED

## 2023-11-01 LAB — LACTIC ACID, PLASMA
Lactic Acid, Venous: 3.9 mmol/L (ref 0.5–1.9)
Lactic Acid, Venous: 2.9 mmol/L (ref 0.5–1.9)
Lactic Acid, Venous: 3.2 mmol/L (ref 0.5–1.9)
Lactic Acid, Venous: 4 mmol/L (ref 0.5–1.9)

## 2023-11-01 SURGERY — CYSTOSCOPY, WITH RETROGRADE PYELOGRAM AND URETERAL STENT INSERTION
Anesthesia: General | Laterality: Left

## 2023-11-01 MED ORDER — ONDANSETRON HCL 4 MG/2ML IJ SOLN: 4.0000 mg | Freq: Once | INTRAMUSCULAR | Status: DC | PRN

## 2023-11-01 MED ORDER — SODIUM CHLORIDE 0.9 % IV BOLUS
500.0000 mL | Freq: Once | INTRAVENOUS | Status: AC
  Administered 2023-11-01: 500 mL via INTRAVENOUS

## 2023-11-01 MED ORDER — IOHEXOL 300 MG/ML  SOLN
INTRAMUSCULAR | Status: DC | PRN
  Administered 2023-11-01: 6 mL via URETHRAL

## 2023-11-01 MED ORDER — SODIUM CHLORIDE 0.9 % IV SOLN
2.0000 g | INTRAVENOUS | Status: DC
  Administered 2023-11-02: 2 g via INTRAVENOUS
  Filled 2023-11-01: qty 20

## 2023-11-01 MED ORDER — PHENYLEPHRINE 80 MCG/ML (10ML) SYRINGE FOR IV PUSH (FOR BLOOD PRESSURE SUPPORT)
PREFILLED_SYRINGE | INTRAVENOUS | Status: AC
  Filled 2023-11-01: qty 10

## 2023-11-01 MED ORDER — ONDANSETRON HCL 4 MG/2ML IJ SOLN
INTRAMUSCULAR | Status: AC
  Filled 2023-11-01: qty 2

## 2023-11-01 MED ORDER — LACTATED RINGERS IV SOLN: INTRAVENOUS | Status: DC | PRN

## 2023-11-01 MED ORDER — MIDAZOLAM HCL 2 MG/2ML IJ SOLN
INTRAMUSCULAR | Status: AC
  Filled 2023-11-01: qty 2

## 2023-11-01 MED ORDER — FENTANYL CITRATE (PF) 100 MCG/2ML IJ SOLN
INTRAMUSCULAR | Status: DC | PRN
  Administered 2023-11-01: 50 ug via INTRAVENOUS

## 2023-11-01 MED ORDER — ACETAMINOPHEN 650 MG RE SUPP: 650.0000 mg | Freq: Four times a day (QID) | RECTAL | Status: DC | PRN

## 2023-11-01 MED ORDER — ACETAMINOPHEN 10 MG/ML IV SOLN
1000.0000 mg | Freq: Once | INTRAVENOUS | Status: DC | PRN
Start: 2023-11-01 — End: 2023-11-01

## 2023-11-01 MED ORDER — KETOROLAC TROMETHAMINE 15 MG/ML IJ SOLN
15.0000 mg | Freq: Once | INTRAMUSCULAR | Status: AC
Start: 2023-11-01 — End: 2023-11-01
  Administered 2023-11-01: 15 mg via INTRAVENOUS
  Filled 2023-11-01: qty 1

## 2023-11-01 MED ORDER — DEXAMETHASONE SODIUM PHOSPHATE 10 MG/ML IJ SOLN
INTRAMUSCULAR | Status: DC | PRN
  Administered 2023-11-01: 5 mg via INTRAVENOUS

## 2023-11-01 MED ORDER — SODIUM CHLORIDE 0.9 % IV BOLUS (SEPSIS)
1000.0000 mL | Freq: Once | INTRAVENOUS | Status: AC
  Administered 2023-11-01: 1000 mL via INTRAVENOUS

## 2023-11-01 MED ORDER — LIDOCAINE 2% (20 MG/ML) 5 ML SYRINGE
INTRAMUSCULAR | Status: DC | PRN
  Administered 2023-11-01: 60 mg via INTRAVENOUS

## 2023-11-01 MED ORDER — MIDAZOLAM HCL 5 MG/5ML IJ SOLN
INTRAMUSCULAR | Status: DC | PRN
  Administered 2023-11-01: 2 mg via INTRAVENOUS

## 2023-11-01 MED ORDER — ONDANSETRON HCL 4 MG/2ML IJ SOLN
4.0000 mg | Freq: Once | INTRAMUSCULAR | Status: AC
  Administered 2023-11-01: 4 mg via INTRAVENOUS
  Filled 2023-11-01: qty 2

## 2023-11-01 MED ORDER — ACETAMINOPHEN 325 MG PO TABS
650.0000 mg | ORAL_TABLET | Freq: Four times a day (QID) | ORAL | Status: DC | PRN
  Administered 2023-11-01 – 2023-11-03 (×3): 650 mg via ORAL
  Filled 2023-11-01 (×3): qty 2

## 2023-11-01 MED ORDER — PHENYLEPHRINE HCL-NACL 20-0.9 MG/250ML-% IV SOLN
INTRAVENOUS | Status: DC | PRN
  Administered 2023-11-01: 25 ug/min via INTRAVENOUS

## 2023-11-01 MED ORDER — CIPROFLOXACIN IN D5W 400 MG/200ML IV SOLN
400.0000 mg | Freq: Once | INTRAVENOUS | Status: AC
  Administered 2023-11-01: 400 mg via INTRAVENOUS
  Filled 2023-11-01: qty 200

## 2023-11-01 MED ORDER — SODIUM CHLORIDE 0.9 % IV SOLN
INTRAVENOUS | Status: AC
Start: 2023-11-01 — End: 2023-11-02

## 2023-11-01 MED ORDER — CHLORHEXIDINE GLUCONATE 0.12 % MT SOLN
15.0000 mL | Freq: Once | OROMUCOSAL | Status: AC
  Administered 2023-11-01: 15 mL via OROMUCOSAL

## 2023-11-01 MED ORDER — PHENYLEPHRINE 80 MCG/ML (10ML) SYRINGE FOR IV PUSH (FOR BLOOD PRESSURE SUPPORT)
PREFILLED_SYRINGE | INTRAVENOUS | Status: DC | PRN
  Administered 2023-11-01 (×2): 160 ug via INTRAVENOUS

## 2023-11-01 MED ORDER — FENTANYL CITRATE PF 50 MCG/ML IJ SOSY: 25.0000 ug | PREFILLED_SYRINGE | INTRAMUSCULAR | Status: DC | PRN

## 2023-11-01 MED ORDER — ONDANSETRON HCL 4 MG PO TABS: 4.0000 mg | ORAL_TABLET | Freq: Four times a day (QID) | ORAL | Status: DC | PRN

## 2023-11-01 MED ORDER — FENTANYL CITRATE (PF) 100 MCG/2ML IJ SOLN
INTRAMUSCULAR | Status: AC
  Filled 2023-11-01: qty 2

## 2023-11-01 MED ORDER — OXYCODONE HCL 5 MG/5ML PO SOLN: 5.0000 mg | Freq: Once | ORAL | Status: DC | PRN

## 2023-11-01 MED ORDER — LIDOCAINE HCL (PF) 2 % IJ SOLN
INTRAMUSCULAR | Status: AC
  Filled 2023-11-01: qty 5

## 2023-11-01 MED ORDER — LACTATED RINGERS IV BOLUS
1000.0000 mL | Freq: Once | INTRAVENOUS | Status: AC
  Administered 2023-11-01: 1000 mL via INTRAVENOUS

## 2023-11-01 MED ORDER — OXYCODONE HCL 5 MG PO TABS: 5.0000 mg | ORAL_TABLET | Freq: Once | ORAL | Status: DC | PRN

## 2023-11-01 MED ORDER — ALBUTEROL SULFATE (2.5 MG/3ML) 0.083% IN NEBU
2.5000 mg | INHALATION_SOLUTION | Freq: Once | RESPIRATORY_TRACT | Status: DC
Start: 2023-11-01 — End: 2023-11-01
  Filled 2023-11-01: qty 3

## 2023-11-01 MED ORDER — LACTATED RINGERS IV BOLUS
500.0000 mL | Freq: Once | INTRAVENOUS | Status: DC
Start: 2023-11-01 — End: 2023-11-01

## 2023-11-01 MED ORDER — FENTANYL CITRATE PF 50 MCG/ML IJ SOSY
100.0000 ug | PREFILLED_SYRINGE | Freq: Once | INTRAMUSCULAR | Status: AC
  Administered 2023-11-01: 100 ug via INTRAVENOUS
  Filled 2023-11-01: qty 2

## 2023-11-01 MED ORDER — PROPOFOL 10 MG/ML IV BOLUS
INTRAVENOUS | Status: AC
  Filled 2023-11-01: qty 20

## 2023-11-01 MED ORDER — IOHEXOL 300 MG/ML  SOLN
100.0000 mL | Freq: Once | INTRAMUSCULAR | Status: AC | PRN
  Administered 2023-11-01: 100 mL via INTRAVENOUS

## 2023-11-01 MED ORDER — OXYCODONE HCL 5 MG PO TABS
5.0000 mg | ORAL_TABLET | ORAL | Status: DC | PRN
  Administered 2023-11-01 – 2023-11-02 (×2): 5 mg via ORAL
  Filled 2023-11-01 (×3): qty 1

## 2023-11-01 MED ORDER — DEXAMETHASONE SODIUM PHOSPHATE 10 MG/ML IJ SOLN
INTRAMUSCULAR | Status: AC
  Filled 2023-11-01: qty 1

## 2023-11-01 MED ORDER — SODIUM CHLORIDE 0.9 % IV SOLN
1.0000 g | Freq: Once | INTRAVENOUS | Status: AC
  Administered 2023-11-01: 1 g via INTRAVENOUS
  Filled 2023-11-01: qty 10

## 2023-11-01 MED ORDER — CHLORHEXIDINE GLUCONATE CLOTH 2 % EX PADS
6.0000 | MEDICATED_PAD | Freq: Every day | CUTANEOUS | Status: DC
  Administered 2023-11-01 – 2023-11-03 (×3): 6 via TOPICAL

## 2023-11-01 MED ORDER — ONDANSETRON HCL 4 MG/2ML IJ SOLN: 4.0000 mg | Freq: Four times a day (QID) | INTRAMUSCULAR | Status: DC | PRN

## 2023-11-01 MED ORDER — KETOROLAC TROMETHAMINE 15 MG/ML IJ SOLN
15.0000 mg | Freq: Once | INTRAMUSCULAR | Status: AC
  Administered 2023-11-01: 15 mg via INTRAVENOUS
  Filled 2023-11-01: qty 1

## 2023-11-01 MED ORDER — ORAL CARE MOUTH RINSE: 15.0000 mL | OROMUCOSAL | Status: DC | PRN

## 2023-11-01 MED ORDER — PROPOFOL 10 MG/ML IV BOLUS
INTRAVENOUS | Status: DC | PRN
  Administered 2023-11-01: 200 mg via INTRAVENOUS

## 2023-11-01 MED ORDER — SUCCINYLCHOLINE CHLORIDE 200 MG/10ML IV SOSY
PREFILLED_SYRINGE | INTRAVENOUS | Status: AC
  Filled 2023-11-01: qty 10

## 2023-11-01 MED ORDER — CEFTRIAXONE SODIUM 1 G IJ SOLR
1.0000 g | Freq: Once | INTRAMUSCULAR | Status: AC
  Administered 2023-11-01: 1 g via INTRAVENOUS
  Filled 2023-11-01: qty 10

## 2023-11-01 MED ORDER — FENTANYL CITRATE PF 50 MCG/ML IJ SOSY
50.0000 ug | PREFILLED_SYRINGE | Freq: Once | INTRAMUSCULAR | Status: DC
  Filled 2023-11-01: qty 1

## 2023-11-01 MED ORDER — FENTANYL CITRATE PF 50 MCG/ML IJ SOSY
50.0000 ug | PREFILLED_SYRINGE | INTRAMUSCULAR | Status: DC | PRN
  Administered 2023-11-01: 50 ug via INTRAVENOUS
  Filled 2023-11-01: qty 1

## 2023-11-01 MED ORDER — FENTANYL CITRATE PF 50 MCG/ML IJ SOSY
50.0000 ug | PREFILLED_SYRINGE | Freq: Once | INTRAMUSCULAR | Status: AC
  Administered 2023-11-01: 50 ug via INTRAVENOUS
  Filled 2023-11-01: qty 1

## 2023-11-01 MED ORDER — LACTATED RINGERS IV SOLN: 150.0000 mL/h | INTRAVENOUS | Status: DC

## 2023-11-01 SURGICAL SUPPLY — 13 items
BAG URO CATCHER STRL LF (MISCELLANEOUS) ×1 IMPLANT
BASKET ZERO TIP NITINOL 2.4FR (BASKET) IMPLANT
CATH URETL OPEN END 6FR 70 (CATHETERS) IMPLANT
CLOTH BEACON ORANGE TIMEOUT ST (SAFETY) ×1 IMPLANT
GLOVE SURG LX STRL 7.5 STRW (GLOVE) ×1 IMPLANT
GOWN STRL REUS W/ TWL XL LVL3 (GOWN DISPOSABLE) ×1 IMPLANT
GUIDEWIRE ANG ZIPWIRE 038X150 (WIRE) IMPLANT
GUIDEWIRE STR DUAL SENSOR (WIRE) ×1 IMPLANT
KIT TURNOVER KIT A (KITS) IMPLANT
MANIFOLD NEPTUNE II (INSTRUMENTS) ×1 IMPLANT
PACK CYSTO (CUSTOM PROCEDURE TRAY) ×1 IMPLANT
STENT URET 6FRX26 CONTOUR (STENTS) IMPLANT
TUBING CONNECTING 10 (TUBING) ×1 IMPLANT

## 2023-11-01 NOTE — ED Provider Notes (Signed)
Patient admitted and pending bed for septic stone. While in ED with overnight provider patient had episode of tachycardia, diaphoresis, and hypotension. Development of sepsis considered. Blood cultures and lactic acid sent. 500cc ivf given prior to this.   7:15 AM LA 3.3. Additional liter IVF ordered. Patient does not qualify for septic shock at this time due to LA <4 and BP 109/59 (75). Will watch closely to see if he responds to IVF. Will consider 30cc/kg fluid bolus if responding well to next liter.  8:03 AM BP improving to 125/90 currently. Patient had pain episode and was given Toradol 15 mg iv with significant improvement of symptoms. One time order of fentanyl 50 mcg prn ordered. Pt has room available at this time. Transport called.    Edwin Dada P, DO 11/01/23 [phone]

## 2023-11-01 NOTE — ED Notes (Signed)
Pt began experiencing chills and shaking. Pt reports he feels like he is having trouble catching his breath. Pt HR in the 140's. MD made aware and at pt bedside. Pt denies trouble breathing or swallowing, no airway swelling noted. Pt does not have rash.

## 2023-11-01 NOTE — H&P (Signed)
History and Physical    Patient: Todd Valdez WGN:[id number] DOB: 06/10/1961 DOA: 11/01/2023 DOS: the patient was seen and examined on 11/01/2023 PCP: Monica Becton, MD  Patient coming from: Home  Chief Complaint:  Chief Complaint  Patient presents with   Flank Pain   HPI: Todd Valdez is a 63 y.o. male with medical history significant of rheumatoid arthritis, melanoma, diverticulosis, diverticulitis, GERD, laryngopharyngeal reflux, gastric outlet obstruction, hypertriglyceridemia, hypertension, right inguinal hernia, cataracts, history of bilateral peripheral ulcerative keratitis, sleep apnea not on CPAP, lumbar spinal stenosis, allergic rhinitis who presented to the emergency department with complaints of left flank pain associated with dysuria, nausea and multiple episodes of emesis since yesterday evening around 1830.  No history of urolithiasis.   No hematuria, diarrhea, constipation, melena or hematochezia. He denied fever, chills, rhinorrhea, sore throat, wheezing or hemoptysis.  No chest pain, palpitations, diaphoresis, PND, orthopnea or pitting edema of the lower extremities. No polyuria, polydipsia, polyphagia or blurred vision.   Lab work: His urinalysis was hazy with trace hemoglobin, positive nitrites, trace leukocyte esterase, many bacteria, greater than 50 WBC and positive WBC clumps.  CBC showed a white count of 22.1 with 77% neutrophils, hemoglobin 14.8 g/dL platelets 784.  CMP showed a glucose of 127 BUN of 27 mg/dL, the rest of the CMP measurements were normal.  First lactic acid was 3.9 mmol/L.  Imaging: Portable 1 view chest radiograph showing a low volume film without acute cardiopulmonary findings.  Cardiopericardial silhouette is at the upper limits of normal size.  CT abdomen/pelvis with contrast showing a 6 x 5 x 4 mm left UPJ obstructing stone with mild hydronephrosis and obstructive uropathy.  Correlate clinically for infectious complication.  Mild left perinephric  fluid which could be due to calyceal leak or due to the obstructive uropathy.  5 mm nonobstructing calyceal stone in the inferior pole of the left kidney.  Bilateral Bosniak 1 cysts.  Moderate size hiatal hernia.  Aortic and coronary atherosclerosis.  Diverticulosis without evidence of diverticulitis.  Mild prostatomegaly.  Small umbilical and bilateral inguinal fat hernias.  ED course: Initial vital signs were temperature 97.6 F, pulse 90, respiration 20, BP 157/92 mmHg O2 sat 98% on room air.  The patient received ceftriaxone 1 g IVPB, ciprofloxacin 400 mg IVPB, fentanyl 100 mcg IVP x 2, fentanyl 50 mcg IVP x 1, Toradol 15 mg IVP x 2, ondansetron 4 mg IVP x 2, LR 1000 mL bolus and normal saline 500 mL bolus.  Review of Systems: As mentioned in the history of present illness. All other systems reviewed and are negative. Past Medical History:  Diagnosis Date   Arthritis    Cancer (HCC)    Melanoma on  back   Diverticulitis    GERD (gastroesophageal reflux disease)    High triglycerides    History of abscess of skin and subcutaneous tissue    right forearm, chin   Hypertension    RA (rheumatoid arthritis) (HCC)    Reflux esophagitis    Right inguinal hernia    Sleep apnea    no cpap   Past Surgical History:  Procedure Laterality Date   bilateral feet reconstruction  10/2000   COLONOSCOPY     feet surgery     INGUINAL HERNIA REPAIR Right 11/27/2017   Procedure: OPEN RIGHT INGUINAL HERNIA REPAIR;  Surgeon: Darnell Level, MD;  Location: [facility];  Service: General;  Laterality: Right;   INSERTION OF MESH Right 11/27/2017   Procedure: INSERTION OF MESH;  Surgeon: Darnell Level, MD;  Location: [facility];  Service: General;  Laterality: Right;   MELANOMA EXCISION     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 His smoking use included cigarettes. He quit smokeless tobacco use about 14 years ago. He reports that he does not drink alcohol and does not use drugs.  No Known Allergies  Family History  Problem Relation Age of Onset   Hypertension Mother    Hypertension Father     Prior to Admission medications   Medication Sig Start Date End Date Taking? Authorizing Provider  Abatacept (ORENCIA CLICKJECT) 125 MG/ML SOAJ Inject 1 ml subcutaneously once a week. 06/19/23   Quentin Angst, MD  atorvastatin (LIPITOR) 40 MG tablet Take 1 tablet (40 mg total) by mouth daily at 6 PM. 03/12/23 03/11/24  Monica Becton, MD  COVID-19 mRNA bivalent vaccine, Pfizer, injection Inject into the muscle. 07/15/21   Judyann Munson, MD  cyclobenzaprine (FLEXERIL) 10 MG tablet One half tab PO qHS, then increase gradually to one tab TID. 10/25/18   Monica Becton, MD  docusate sodium (COLACE) 100 MG capsule Take 1 capsule (100 mg total) by mouth 2 (two) times daily. While taking narcotic pain medicine. 02/17/21   Jacinta Shoe, PA-C  doxycycline (ADOXA) 50 MG tablet Take 1 tablet (50 mg total) by mouth daily. 10/12/23     esomeprazole (NEXIUM) 40 MG capsule Take 1 capsule (40 mg total) by mouth daily at 12 noon. 09/09/23 09/08/24  Monica Becton, MD  Evolocumab (REPATHA SURECLICK) 140 MG/ML SOAJ Inject 140 mg into the skin every 14 (fourteen) days. 05/30/23 05/29/24  Monica Becton, MD  fenofibrate 160 MG tablet Take 1 tablet (160 mg total) by mouth daily. 02/19/23   Monica Becton, MD  fluticasone (FLONASE) 50 MCG/ACT nasal spray Place 1 spray into both nostrils 2 (two) times daily. 10/22/23   Monica Becton, MD  folic acid (FOLVITE) 1 MG tablet Take 1 tablet (1 mg total) by mouth daily. 03/26/23     ibuprofen (ADVIL) 800 MG tablet Take 1 tablet 3  times a day by oral route 03/04/21     lisinopril-hydrochlorothiazide (ZESTORETIC) 20-25 MG tablet Take 1 tablet by mouth daily. **NEED APPOINTMENT FOR FURTHER REFILLS** 12/11/22 12/11/23  Monica Becton, MD  methotrexate 250 MG/10ML injection Inject 1 ml Injection once a week 90 days 03/10/21     methotrexate 50 MG/2ML injection Inject 1 mL (25 mg total) once a week. 06/13/22     methotrexate 50 MG/2ML injection Inject 1 mL (25 mg total) into the muscle once a week. 01/03/23     Needles & Syringes (1CC TB SYRINGE) MISC     [provider]  niacin (,VITAMIN B3,) 500 MG tablet Take 1 tablet (500 mg total) by mouth at bedtime. 08/21/23   Monica Becton, MD  omega-3 acid ethyl esters (LOVAZA) 1 g capsule Take 2 capsules (2 grams total) by mouth 2 (two) times daily. 09/17/23 09/16/24  Monica Becton, MD  predniSONE (DELTASONE) 50 MG tablet Take 1 tablet (50 mg total) by mouth daily for 5 days. 10/22/23   Monica Becton, MD  promethazine-dextromethorphan (PROMETHAZINE-DM) 6.25-15 MG/5ML syrup Take 5 mLs by  mouth 2 (two) times daily as needed for cough. 10/06/22   Trevor Iha, FNP  senna (SENOKOT) 8.6 MG TABS tablet Take 2 tablets (17.2 mg total) by mouth 2 (two) times daily. 02/17/21   Jacinta Shoe, PA-C  TUBERCULIN SYR 1CC/27GX1/2" (B-D TB SYRINGE 1CC/27GX1/2") 27G X 1/2" 1 ML MISC use to inject methotrexate once a week 02/16/21     Tuberculin-Allergy Syringes 27G X 1/2" 1 ML MISC Use once a week for subcutaneous MTX injection 06/13/22       Physical Exam: Vitals:   11/01/23 0632 11/01/23 0645 11/01/23 0731 11/01/23 0900  BP:      Pulse: (!) 126     Resp: 20     Temp:  98 F (36.7 C) (!) 97.5 F (36.4 C) 97.9 F (36.6 C)  TempSrc:  Oral Oral Oral  SpO2: 91%     Weight:      Height:       Physical Exam Vitals and nursing note reviewed.  Constitutional:      General: He is awake. He is not in acute distress.    Appearance: Normal appearance. He is  overweight. He is ill-appearing.  HENT:     Head: Normocephalic.     Nose: No rhinorrhea.     Mouth/Throat:     Mouth: Mucous membranes are dry.  Eyes:     General: No scleral icterus.    Pupils: Pupils are equal, round, and reactive to light.  Neck:     Vascular: No JVD.  Cardiovascular:     Rate and Rhythm: Regular rhythm. Tachycardia present.     Heart sounds: S1 normal and S2 normal.  Pulmonary:     Effort: Pulmonary effort is normal.     Breath sounds: Normal breath sounds. No wheezing, rhonchi or rales.  Abdominal:     General: Bowel sounds are normal. There is no distension.     Palpations: Abdomen is soft.     Tenderness: There is abdominal tenderness in the suprapubic area. There is left CVA tenderness. There is no right CVA tenderness, guarding or rebound.  Musculoskeletal:     Cervical back: Neck supple.     Right lower leg: No edema.     Left lower leg: No edema.  Skin:    General: Skin is warm and dry.  Neurological:     General: No focal deficit present.     Mental Status: He is alert and oriented to person, place, and time.  Psychiatric:        Mood and Affect: Mood normal.        Behavior: Behavior normal. Behavior is cooperative.     Data Reviewed:  Results are pending, will review when available. EKG: Vent. rate 128 BPM PR interval 110 ms QRS duration 83 ms QT/QTcB 284/415 ms P-R-T axes 232 3 -8 Sinus or ectopic atrial tachycardia Low voltage, precordial leads Consider anterior infarct Borderline T abnormalities, inferior leads  Assessment and Plan: Principal Problem:   Left ureteral stone Complicated by:   Sepsis secondary to UTI (HCC) Admit to SDU/inpatient. Continue IV fluids. Follow-up lactic acid level. Continue ceftriaxone 2 g IVPB every 24 hours. Follow-up urine culture and sensitivity. Follow-up blood culture and sensitivity Follow CBC and CMP in a.m. Urology consult appreciated. -Will need urgent stent placement with stone  removal. -Already scheduled with Dr. Mena Goes this afternoon.  Active Problems:   Prostate enlargement The patient will be following with urology.    Rheumatoid arthritis (HCC)  Lumbar spinal stenosis Resume prednisone 1 mg p.o. after procedure. No need methotrexate or Repatha while in the hospital. Continue analgesics as needed. -Switch to oral hydrocodone postprocedure.    Hypertension As needed parenteral/oral antihypertensives for now. Resume lisinopril/HCTZ tomorrow if renal function remains normal.    Hyperlipidemia, predominantly triglycerides   Aortic atherosclerosis (HCC) On atorvastatin and Lovaza.    Abnormal electrocardiogram (ECG) (EKG)    Coronary atherosclerosis On statin and fish oils as above. Check echocardiogram while in the hospital.    Hyperglycemia Check fasting glucose.    Umbilical hernia without obstruction or gangrene   Bilateral inguinal hernia without obstruction or gangrene Asymptomatic at this time. Follow-up with general surgery as an outpatient.    Advance Care Planning:   Code Status: Full Code   Consults: Kasandra Knudsen, MD (urology)  Family Communication: His spouse was at bedside.  Severity of Illness: The appropriate patient status for this patient is INPATIENT. Inpatient status is judged to be reasonable and necessary in order to provide the required intensity of service to ensure the patient's safety. The patient's presenting symptoms, physical exam findings, and initial radiographic and laboratory data in the context of their chronic comorbidities is felt to place them at high risk for further clinical deterioration. Furthermore, it is not anticipated that the patient will be medically stable for discharge from the hospital within 2 midnights of admission.   * I certify that at the point of admission it is my clinical judgment that the patient will require inpatient hospital care spanning beyond 2 midnights from the point of  admission due to high intensity of service, high risk for further deterioration and high frequency of surveillance required.*  Author: Bobette Mo, MD 11/01/2023 9:42 AM  For on call review [email or web address].   This document was prepared using Dragon voice recognition software and may contain some unintended transcription errors.

## 2023-11-01 NOTE — ED Provider Notes (Signed)
[place] [facility] AT [facility] Provider Note   CSN: [id number] Arrival date & time: 11/01/23  7829     History  Chief Complaint  Patient presents with   Flank Pain    Todd Valdez is a 63 y.o. male.  The history is provided by the patient and the spouse.  Flank Pain This is a new problem. The current episode started 6 to 12 hours ago. The problem occurs constantly. The problem has been rapidly worsening. Associated symptoms include abdominal pain. Pertinent negatives include no chest pain and no shortness of breath. Nothing relieves the symptoms.  Patient with history of rheumatoid arthritis presents for flank and abdominal pain.  This has been ongoing for over 9 hours.  He reports abrupt onset of left flank pain that radiates throughout his abdomen.  He reports associated nonbloody emesis.  No change in his bowel movements.  No chest pain or shortness of breath.  He has never had this before.  He reports previous history of abdominal surgery/hernia repair He had otherwise been at his baseline.  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 Patient reports he takes doxycycline daily for "my eyes" No urinary symptoms.  Patient is a non-smoker Past Medical History:  Diagnosis Date   Arthritis    Cancer (HCC)    Melanoma on  back   Diverticulitis    GERD (gastroesophageal reflux disease)    High triglycerides    History of abscess of skin and subcutaneous tissue    right forearm, chin   Hypertension    RA (rheumatoid arthritis) (HCC)    Reflux esophagitis    Right inguinal hernia    Sleep apnea    no cpap    Home Medications Prior to Admission medications   Medication Sig Start Date End Date Taking? Authorizing Provider  Abatacept (ORENCIA CLICKJECT) 125 MG/ML SOAJ Inject 1 ml subcutaneously once a week. 06/19/23   Quentin Angst, MD  atorvastatin (LIPITOR) 40 MG tablet Take 1 tablet (40 mg total) by mouth daily at 6 PM.  03/12/23 03/11/24  Monica Becton, MD  COVID-19 mRNA bivalent vaccine, Pfizer, injection Inject into the muscle. 07/15/21   Judyann Munson, MD  cyclobenzaprine (FLEXERIL) 10 MG tablet One half tab PO qHS, then increase gradually to one tab TID. 10/25/18   Monica Becton, MD  docusate sodium (COLACE) 100 MG capsule Take 1 capsule (100 mg total) by mouth 2 (two) times daily. While taking narcotic pain medicine. 02/17/21   Jacinta Shoe, PA-C  doxycycline (ADOXA) 50 MG tablet Take 1 tablet (50 mg total) by mouth daily. 10/12/23     esomeprazole (NEXIUM) 40 MG capsule Take 1 capsule (40 mg total) by mouth daily at 12 noon. 09/09/23 09/08/24  Monica Becton, MD  Evolocumab (REPATHA SURECLICK) 140 MG/ML SOAJ Inject 140 mg into the skin every 14 (fourteen) days. 05/30/23 05/29/24  Monica Becton, MD  fenofibrate 160 MG tablet Take 1 tablet (160 mg total) by mouth daily. 02/19/23   Monica Becton, MD  fluticasone (FLONASE) 50 MCG/ACT nasal spray Place 1 spray into both nostrils 2 (two) times daily. 10/22/23   Monica Becton, MD  folic acid (FOLVITE) 1 MG tablet Take 1 tablet (1 mg total) by mouth daily. 03/26/23     ibuprofen (ADVIL) 800 MG tablet Take 1 tablet 3 times a day by oral route 03/04/21     lisinopril-hydrochlorothiazide (ZESTORETIC) 20-25 MG tablet Take 1 tablet by mouth  daily. **NEED APPOINTMENT FOR FURTHER REFILLS** 12/11/22 12/11/23  Monica Becton, MD  methotrexate 250 MG/10ML injection Inject 1 ml Injection once a week 90 days 03/10/21     methotrexate 50 MG/2ML injection Inject 1 mL (25 mg total) once a week. 06/13/22     methotrexate 50 MG/2ML injection Inject 1 mL (25 mg total) into the muscle once a week. 01/03/23     Needles & Syringes (1CC TB SYRINGE) MISC     [provider]  niacin (,VITAMIN B3,) 500 MG tablet Take 1 tablet (500 mg total) by mouth at bedtime. 08/21/23   Monica Becton, MD  omega-3 acid ethyl esters (LOVAZA) 1 g  capsule Take 2 capsules (2 grams total) by mouth 2 (two) times daily. 09/17/23 09/16/24  Monica Becton, MD  predniSONE (DELTASONE) 50 MG tablet Take 1 tablet (50 mg total) by mouth daily for 5 days. 10/22/23   Monica Becton, MD  promethazine-dextromethorphan (PROMETHAZINE-DM) 6.25-15 MG/5ML syrup Take 5 mLs by mouth 2 (two) times daily as needed for cough. 10/06/22   Trevor Iha, FNP  senna (SENOKOT) 8.6 MG TABS tablet Take 2 tablets (17.2 mg total) by mouth 2 (two) times daily. 02/17/21   Jacinta Shoe, PA-C  TUBERCULIN SYR 1CC/27GX1/2" (B-D TB SYRINGE 1CC/27GX1/2") 27G X 1/2" 1 ML MISC use to inject methotrexate once a week 02/16/21     Tuberculin-Allergy Syringes 27G X 1/2" 1 ML MISC Use once a week for subcutaneous MTX injection 06/13/22         Allergies    Patient has no known allergies.    Review of Systems   Review of Systems  Constitutional:  Negative for fever.  Respiratory:  Negative for shortness of breath.   Cardiovascular:  Negative for chest pain.  Gastrointestinal:  Positive for abdominal pain, nausea and vomiting. Negative for blood in stool, constipation and diarrhea.  Genitourinary:  Positive for flank pain. Negative for difficulty urinating, dysuria, frequency and testicular pain.    Physical Exam Updated Vital Signs BP (!) 147/88   Pulse (!) 126   Temp 98 F (36.7 C) (Oral)   Resp 20   Ht 1.854 m (6\' 1" )   Wt 97.1 kg   SpO2 91%   BMI 28.23 kg/m  Physical Exam CONSTITUTIONAL: Well developed/well nourished, uncomfortable appearing HEAD: Normocephalic/atraumatic EYES: EOMI/PERRL, no icterus ENMT: Mucous membranes moist NECK: supple no meningeal signs SPINE/BACK:entire spine nontender CV: S1/S2 noted, no murmurs/rubs/gallops noted LUNGS: Lungs are clear to auscultation bilaterally, no apparent distress ABDOMEN: soft, diffuse moderate tenderness, no rebound or guarding, bowel sounds noted throughout abdomen GU:no cva tenderness NEURO: Pt  is awake/alert/appropriate, moves all extremitiesx4.  No facial droop.   EXTREMITIES: pulses normal/equal, full ROM, boutonniere deformity and ulnar deviation of fingers noted due to RA SKIN: warm, color normal   ED Results / Procedures / Treatments   Labs (all labs ordered are listed, but only abnormal results are displayed) Labs Reviewed  URINALYSIS, ROUTINE W REFLEX MICROSCOPIC - Abnormal; Notable for the following components:      Result Value   APPearance HAZY (*)    Hgb urine dipstick TRACE (*)    Nitrite POSITIVE (*)    Leukocytes,Ua TRACE (*)    All other components within normal limits  CBC WITH DIFFERENTIAL/PLATELET - Abnormal; Notable for the following components:   WBC 22.1 (*)    Neutro Abs 17.0 (*)    Monocytes Absolute 1.3 (*)    Eosinophils Absolute 0.6 (*)  Abs Immature Granulocytes 0.11 (*)    All other components within normal limits  COMPREHENSIVE METABOLIC PANEL - Abnormal; Notable for the following components:   Glucose, Bld 127 (*)    BUN 27 (*)    All other components within normal limits  URINALYSIS, MICROSCOPIC (REFLEX) - Abnormal; Notable for the following components:   Bacteria, UA MANY (*)    All other components within normal limits  URINE CULTURE  CULTURE, BLOOD (ROUTINE X 2)  CULTURE, BLOOD (ROUTINE X 2)  LACTIC ACID, PLASMA  LACTIC ACID, PLASMA    EKG EKG Interpretation Date/Time:  Thursday November 01 2023 06:26:14 EST Ventricular Rate:  128 PR Interval:  110 QRS Duration:  83 QT Interval:  284 QTC Calculation: 415 R Axis:   3  Text Interpretation: Sinus or ectopic atrial tachycardia Low voltage, precordial leads Consider anterior infarct Borderline T abnormalities, inferior leads Interpretation limited secondary to artifact Confirmed by Zadie Rhine ([id number]) on 11/01/2023 6:31:04 AM  Radiology DG Chest Port 1 View Result Date: 11/01/2023 CLINICAL DATA:  Hypertension and sleep apnea.  Possible sepsis. EXAM: PORTABLE CHEST 1 VIEW  COMPARISON:  10/22/2023 FINDINGS: Low volume film. The lungs are clear without focal pneumonia, edema, pneumothorax or pleural effusion. Cardiopericardial silhouette is at upper limits of normal for size. No acute bony abnormality. Telemetry leads overlie the chest. IMPRESSION: Low volume film without acute cardiopulmonary findings. Electronically Signed   By: Kennith Center M.D.   On: 11/01/2023 06:39   CT ABDOMEN PELVIS W CONTRAST Result Date: 11/01/2023 CLINICAL DATA:  Left flank pain, nausea and vomiting. EXAM: CT ABDOMEN AND PELVIS WITH CONTRAST TECHNIQUE: Multidetector CT imaging of the abdomen and pelvis was performed using the standard protocol following bolus administration of intravenous contrast. RADIATION DOSE REDUCTION: This exam was performed according to the departmental dose-optimization program which includes automated exposure control, adjustment of the mA and/or kV according to patient size and/or use of iterative reconstruction technique. CONTRAST:  OMNIPAQUE IOHEXOL 300 MG/ML  SOLN COMPARISON:  None Available. FINDINGS: Lower chest: Moderate-sized hiatal hernia with about 1/3 of the stomach intrathoracic. The cardiac size is normal. There is trace calcification in the right coronary artery. There is subpleural reticulation in the right-greater-than-left lower lobes. No acute lung base infiltrates. Elevated right hemidiaphragm. Hepatobiliary: The liver is 19 cm length with mild steatosis. There is no mass enhancement. The gallbladder and bile ducts are unremarkable. Pancreas: No abnormality. Spleen: No abnormality. No splenomegaly. Small splenule posteriorly. Adrenals/Urinary Tract: 6 x 5 x 4 mm left UPJ obstructing stone, with mild hydronephrosis. There is mild left perinephric fluid which could be due to a caliceal leak or due to obstructive uropathy. Asymmetric cortical contrast retention noted in the left kidney and lack of contrast excretion on the delayed images consistent with  obstructive uropathy. There is a 5 mm nonobstructive caliceal stone in the inferior pole of the kidney. No right nephrolithiasis is seen prior There is no adrenal mass. On the right there is a Bosniak 1 cyst in the superior pole measuring 1 cm, 18 Hounsfield units, and a 7 mm hypodense too small to characterize Bosniak 2 cyst in the inferior pole. On the left, there is a 2 cm Bosniak 1 cyst in the upper pole, Hounsfield density 13.2, and a 1.2 cm Bosniak 1 cyst in the anterior hilar cortex with Hounsfield density of 15. There is no solid mass enhancement in either kidney. No follow-up imaging is recommended. There is no bladder thickening. Stomach/Bowel: No dilatation or  wall thickening including the appendix. There is sigmoid diverticulosis without evidence of diverticulitis. Vascular/Lymphatic: Aortic atherosclerosis. No enlarged abdominal or pelvic lymph nodes. Reproductive: Mild prostatomegaly. Other: Small umbilical and bilateral inguinal fat hernias. No incarcerated hernia. No free pelvic fluid. No free hemorrhage or free air. Musculoskeletal: Slight thoracic dextroscoliosis. Multilevel degenerative disc space loss lower thoracic spine, with degenerative disc disease and spondylosis at L4-5 and L5-S1 No acute or other significant osseous findings. No focal pathologic process. IMPRESSION: 1. 6 x 5 x 4 mm left UPJ obstructing stone with mild hydronephrosis and obstructive uropathy. Correlate clinically for infectious complication. 2. Mild left perinephric fluid which could be due to a caliceal leak or due to the obstructive uropathy. 3. 5 mm nonobstructive caliceal stone in the inferior pole of the left kidney. Bilateral Bosniak 1 cysts. 4. Moderate-sized hiatal hernia. 5. Aortic and coronary artery atherosclerosis. 6. Diverticulosis without evidence of diverticulitis. 7. Mild prostatomegaly. 8. Small umbilical and bilateral inguinal fat hernias. Aortic Atherosclerosis (ICD10-I70.0). Electronically Signed   By:  Almira Bar M.D.   On: 11/01/2023 05:10    Procedures .Critical Care  Performed by: Zadie Rhine, MD Authorized by: Zadie Rhine, MD   Critical care provider statement:    Critical care time (minutes):  35   Critical care start time:  11/01/2023 5:30 AM   Critical care end time:  11/01/2023 6:05 AM   Critical care time was exclusive of:  Separately billable procedures and treating other patients   Critical care was necessary to treat or prevent imminent or life-threatening deterioration of the following conditions:  Sepsis and dehydration   Critical care was time spent personally by me on the following activities:  Examination of patient, review of old charts, re-evaluation of patient's condition, ordering and review of radiographic studies, ordering and review of laboratory studies, pulse oximetry, ordering and performing treatments and interventions, discussions with consultants and development of treatment plan with patient or surrogate   I assumed direction of critical care for this patient from another provider in my specialty: no       Medications Ordered in ED Medications  ciprofloxacin (CIPRO) IVPB 400 mg (400 mg Intravenous New Bag/Given 11/01/23 0650)  lactated ringers bolus 500 mL (has no administration in time range)  ondansetron (ZOFRAN) injection 4 mg (4 mg Intravenous Given 11/01/23 0250)  fentaNYL (SUBLIMAZE) injection 100 mcg (100 mcg Intravenous Given 11/01/23 0334)  sodium chloride 0.9 % bolus 500 mL (0 mLs Intravenous Stopped 11/01/23 0517)  iohexol (OMNIPAQUE) 300 MG/ML solution 100 mL (100 mLs Intravenous Contrast Given 11/01/23 0419)  fentaNYL (SUBLIMAZE) injection 100 mcg (100 mcg Intravenous Given 11/01/23 0454)  ketorolac (TORADOL) 15 MG/ML injection 15 mg (15 mg Intravenous Given 11/01/23 0520)  cefTRIAXone (ROCEPHIN) 1 g in sodium chloride 0.9 % 100 mL IVPB (0 g Intravenous Stopped 11/01/23 0555)    ED Course/ Medical Decision Making/ A&P Clinical Course  as of 11/01/23 0709  Thu Nov 01, 2023  0330 Patient presented for left flank pain now throughout his abdomen.  He appears uncomfortable.  Patient is immune suppressed at baseline due to his multiple drug therapies for rheumatoid arthritis  Patient will need CT imaging [DW]  0330 WBC(!): 22.1 Leukocytosis [DW]  0547 CT results revealed large UPJ stone.  Urine is also consistent with UTI.  IV Rocephin has been ordered [DW]  0547 I have discussed the case with Dr. Arita Miss with urology.  Given his underlying health conditions, immunosuppression, elevated white count and evidence of infection, patient  would benefit from IV antibiotics and admission.  Patient will likely require ureteral stent. [DW]  0602 Around the time the patient received Rocephin, he started having chills, shaking and felt out of breath.  He had scattered wheezing.  He denies any difficulty swallowing or oral swelling.  There is no angioedema.  On exam he is awake and alert and appears tremulous.  He is tachycardic but not febrile.  He has scattered wheezing. No obvious rash.  It is unclear if this is due to the Rocephin, or patient is becoming septic with chills and tachycardia.  At this point we will stop the Rocephin, give neb treatment, check chest x-ray and send blood cultures and lactic acid [DW]  5784 Discussed with Dr. Arlean Hopping for admission to [facility]. Patient will be admitted to the stepdown unit @ [facility].  [DW]  [phone] Patient is now feeling improved.  His heart rate is improving.  No hypoxia.  No angioedema.  He denies chest pain.  The brief wheezing that was heard has resolved.  Will defer nebs for now [DW]  0641 Pt reports feeling improved His work of breathing is improved Denies CP/ABD pain No angioedema Cultures/lactate have been ordered  Since this may have been rxn to rocephin, will load him with cipro for now  [DW]  0707 Pt awaiting admission Given history, this could be early sepsis Will give IV  Fluids and lactate pending If he has any new CP/SOB, may need chest imaging  [DW]    Clinical Course User Index [DW] Zadie Rhine, MD                                 Medical Decision Making Amount and/or Complexity of Data Reviewed Labs: ordered. Decision-making details documented in ED Course. Radiology: ordered. ECG/medicine tests: ordered.  Risk Prescription drug management. Decision regarding hospitalization.   This patient presents to the ED for concern of abdominal pain, flank pain, this involves an extensive number of treatment options, and is a complaint that carries with it a high risk of complications and morbidity.  The differential diagnosis includes but is not limited to cholecystitis, cholelithiasis, pancreatitis, gastritis, peptic ulcer disease, appendicitis, bowel obstruction, bowel perforation, diverticulitis, AAA, ischemic bowel, pyelonephritis, kidney stone, acute coronary syndrome    Comorbidities that complicate the patient evaluation: Patient's presentation is complicated by their history of rheumatoid arthritis  Additional history obtained: Additional history obtained from spouse Records reviewed Primary Care Documents  Lab Tests: I Ordered, and personally interpreted labs.  The pertinent results include: Leukocytosis  Imaging Studies ordered: I ordered imaging studies including CT scan abdomen pelvis   I independently visualized and interpreted imaging which showed ureteral stone with hydronephrosis I agree with the radiologist interpretation   Medicines ordered and prescription drug management: I ordered medication including fentanyl for pain Reevaluation of the patient after these medicines showed that the patient    stayed the same   Critical Interventions:   IV antibiotics and admission  Consultations Obtained: I requested consultation with the admitting physician [facility] and consultant urology , and discussed  findings as well as  pertinent plan - they recommend: Admit  Reevaluation: After the interventions noted above, I reevaluated the patient and found that they have :improved  Complexity of problems addressed: Patient's presentation is most consistent with  acute presentation with potential threat to life or bodily function  Disposition: After consideration of the diagnostic results and  the patient's response to treatment,  I feel that the patent would benefit from admission   .           Final Clinical Impression(s) / ED Diagnoses Final diagnoses:  Ureteral stone with hydronephrosis  Acute cystitis without hematuria    Rx / DC Orders ED Discharge Orders     None         Zadie Rhine, MD 11/01/23 [phone]

## 2023-11-01 NOTE — Anesthesia Procedure Notes (Signed)
Procedure Name: LMA Insertion Date/Time: 11/01/2023 12:22 PM  Performed by: Elisabeth Cara, CRNAPre-anesthesia Checklist: Patient identified, Emergency Drugs available, Suction available, Patient being monitored and Timeout performed Patient Re-evaluated:Patient Re-evaluated prior to induction Oxygen Delivery Method: Circle system utilized Preoxygenation: Pre-oxygenation with 100% oxygen Induction Type: IV induction LMA: LMA with gastric port inserted LMA Size: 4.0 Tube type: Oral Number of attempts: 1 Placement Confirmation: positive ETCO2 and breath sounds checked- equal and bilateral Tube secured with: Tape Dental Injury: Teeth and Oropharynx as per pre-operative assessment

## 2023-11-01 NOTE — ED Triage Notes (Signed)
Pt reports left flank pain with nausea/vomiting since 1830 last evening No hx of kidney stones

## 2023-11-01 NOTE — Discharge Instructions (Signed)
Be sure to follow-up with Dr. Mena Goes to ensure the stent/stones are removed-[facility]-[phone]

## 2023-11-01 NOTE — Consult Note (Signed)
Urology Consult Note   Requesting Attending Physician:  Bobette Mo, MD Service Providing Consult: Urology  Consulting Attending: Dr. Mena Goes   Reason for Consult:  ureteral stone, sepsis  HPI: Todd Valdez is seen in consultation for reasons noted above at the request of Bobette Mo, MD. Patient presented to [facility] around 2 AM on 11/01/2023 with significant flank pain for 9 hours.  CT A/P revealed 6 x 5 x 4 left UPJ stone.  Patient has not had a kidney stone in the past and reports no prior urologic history.  Urinalysis was nitrite positive, lactic acid 3.9, WBC 22.1.  He was transferred to [facility] for urgent ureteral stent placement and has been admitted to the ICU.  On my arrival patient was resting in bed comfortably.  He is showing improvement in his lactic acidosis and has been hemodynamically stable through volume resuscitation.  He was resting in bed with mild to moderate discomfort.  He was accompanied by his wife.  Case and plan was reviewed and they are amenable to proceeding with urgent ureteral stent placement and definitive stone management on an outpatient basis.  All questions were answered to their satisfaction.  ------------------  Assessment:  63 y.o. male with left side ureteral stone and developing sepsis   Recommendations: # Left ureteral stone # UTI  Tract positive UTI.  ABX per primary Improving lactic acidosis with hemodynamic stability following volume resuscitation. The OR for urgent ureteral stent placement with Dr. Mena Goes.  Definitive stone management on an outpatient basis after infection has been treated. Renal function is preserved, strict I's and O's and trend labs. Will follow  Case and plan discussed with Dr. Mena Goes  Past Medical History: Past Medical History:  Diagnosis Date   Arthritis    Cancer (HCC)    Melanoma on  back   Diverticulitis    GERD (gastroesophageal reflux disease)    High  triglycerides    History of abscess of skin and subcutaneous tissue    right forearm, chin   Hypertension    RA (rheumatoid arthritis) (HCC)    Reflux esophagitis    Right inguinal hernia    Sleep apnea    no cpap    Past Surgical History:  Past Surgical History:  Procedure Laterality Date   bilateral feet reconstruction  10/2000   COLONOSCOPY     feet surgery     INGUINAL HERNIA REPAIR Right 11/27/2017   Procedure: OPEN RIGHT INGUINAL HERNIA REPAIR;  Surgeon: Darnell Level, MD;  Location: [facility];  Service: General;  Laterality: Right;   INSERTION OF MESH Right 11/27/2017   Procedure: INSERTION OF MESH;  Surgeon: Darnell Level, MD;  Location: [facility] ;  Service: General;  Laterality: Right;   MELANOMA EXCISION     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   Procedure: Right transmetatarsal amputation and heelcord lengthening;  Surgeon: Toni Arthurs, MD;  Location: [facility];  Service: Orthopedics;  Laterality: Right;   UPPER GI ENDOSCOPY  12/2011    Medication: Current Facility-Administered Medications  Medication Dose Route Frequency Provider Last Rate Last Admin   0.9 %  sodium chloride infusion   Intravenous Continuous Bobette Mo, MD 999 mL/hr at 11/01/23 1047 Infusion Verify at 11/01/23 1047   acetaminophen (TYLENOL) tablet 650 mg  650 mg Oral Q6H PRN Bobette Mo, MD       Or   acetaminophen (TYLENOL) suppository 650 mg  650 mg Rectal  Q6H PRN Bobette Mo, MD       cefTRIAXone (ROCEPHIN) 1 g in sodium chloride 0.9 % 100 mL IVPB  1 g Intravenous Once Bobette Mo, MD 200 mL/hr at 11/01/23 1047 Infusion Verify at 11/01/23 1047   [START ON 11/02/2023] cefTRIAXone (ROCEPHIN) 2 g in sodium chloride 0.9 % 100 mL IVPB  2 g Intravenous Q24H Bobette Mo, MD       Chlorhexidine Gluconate Cloth 2 % PADS 6 each  6 each Topical Daily Howerter, Justin B, DO   6 each at 11/01/23 2951   fentaNYL  (SUBLIMAZE) injection 50 mcg  50 mcg Intravenous Once Franne Forts, DO       ondansetron [facility]) tablet 4 mg  4 mg Oral Q6H PRN Bobette Mo, MD       Or   ondansetron [facility]) injection 4 mg  4 mg Intravenous Q6H PRN Bobette Mo, MD       Oral care mouth rinse  15 mL Mouth Rinse PRN Howerter, Justin B, DO        Allergies: No Known Allergies  Social History: Social History   Tobacco Use   Smoking status: Former    Current packs/day: 0.00    Types: Cigarettes    Quit date: 02/07/1991    Years since quitting: 32.7   Smokeless tobacco: Former    Quit date: 08/14/2009  Vaping Use   Vaping status: Never Used  Substance Use Topics   Alcohol use: No   Drug use: No    Family History Family History  Problem Relation Age of Onset   Hypertension Mother    Hypertension Father     Review of Systems  Genitourinary:  Positive for flank pain. Negative for dysuria, frequency, hematuria and urgency.     Objective   Vital signs in last 24 hours: BP (!) 147/88   Pulse (!) 126   Temp 97.9 F (36.6 C) (Oral)   Resp 20   Ht 6\' 1"  (1.854 m)   Wt 97.1 kg   SpO2 91%   BMI 28.23 kg/m   Physical Exam General: A&O, resting, appropriate HEENT: [place]/AT Pulmonary: Normal work of breathing Cardiovascular: no cyanosis   Most Recent Labs: Lab Results  Component Value Date   WBC 22.1 (H) 11/01/2023   HGB 14.8 11/01/2023   HCT 43.4 11/01/2023   PLT 289 11/01/2023    Lab Results  Component Value Date   NA 139 11/01/2023   K 4.0 11/01/2023   CL 107 11/01/2023   CO2 22 11/01/2023   BUN 27 (H) 11/01/2023   CREATININE 1.24 11/01/2023   CALCIUM 9.6 11/01/2023    No results found for: "INR", "APTT"   Urine Culture: @[id number] (laburin,org,[id number],[id number])@   IMAGING: DG Chest Port 1 View Result Date: 11/01/2023 CLINICAL DATA:  Hypertension and sleep apnea.  Possible sepsis. EXAM: PORTABLE CHEST 1 VIEW COMPARISON:  10/22/2023 FINDINGS: Low volume film. The lungs  are clear without focal pneumonia, edema, pneumothorax or pleural effusion. Cardiopericardial silhouette is at upper limits of normal for size. No acute bony abnormality. Telemetry leads overlie the chest. IMPRESSION: Low volume film without acute cardiopulmonary findings. Electronically Signed   By: Kennith Center M.D.   On: 11/01/2023 06:39   CT ABDOMEN PELVIS W CONTRAST Result Date: 11/01/2023 CLINICAL DATA:  Left flank pain, nausea and vomiting. EXAM: CT ABDOMEN AND PELVIS WITH CONTRAST TECHNIQUE: Multidetector CT imaging of the abdomen and pelvis was performed using the standard protocol following bolus  administration of intravenous contrast. RADIATION DOSE REDUCTION: This exam was performed according to the departmental dose-optimization program which includes automated exposure control, adjustment of the mA and/or kV according to patient size and/or use of iterative reconstruction technique. CONTRAST:  OMNIPAQUE IOHEXOL 300 MG/ML  SOLN COMPARISON:  None Available. FINDINGS: Lower chest: Moderate-sized hiatal hernia with about 1/3 of the stomach intrathoracic. The cardiac size is normal. There is trace calcification in the right coronary artery. There is subpleural reticulation in the right-greater-than-left lower lobes. No acute lung base infiltrates. Elevated right hemidiaphragm. Hepatobiliary: The liver is 19 cm length with mild steatosis. There is no mass enhancement. The gallbladder and bile ducts are unremarkable. Pancreas: No abnormality. Spleen: No abnormality. No splenomegaly. Small splenule posteriorly. Adrenals/Urinary Tract: 6 x 5 x 4 mm left UPJ obstructing stone, with mild hydronephrosis. There is mild left perinephric fluid which could be due to a caliceal leak or due to obstructive uropathy. Asymmetric cortical contrast retention noted in the left kidney and lack of contrast excretion on the delayed images consistent with obstructive uropathy. There is a 5 mm nonobstructive caliceal  stone in the inferior pole of the kidney. No right nephrolithiasis is seen prior There is no adrenal mass. On the right there is a Bosniak 1 cyst in the superior pole measuring 1 cm, 18 Hounsfield units, and a 7 mm hypodense too small to characterize Bosniak 2 cyst in the inferior pole. On the left, there is a 2 cm Bosniak 1 cyst in the upper pole, Hounsfield density 13.2, and a 1.2 cm Bosniak 1 cyst in the anterior hilar cortex with Hounsfield density of 15. There is no solid mass enhancement in either kidney. No follow-up imaging is recommended. There is no bladder thickening. Stomach/Bowel: No dilatation or wall thickening including the appendix. There is sigmoid diverticulosis without evidence of diverticulitis. Vascular/Lymphatic: Aortic atherosclerosis. No enlarged abdominal or pelvic lymph nodes. Reproductive: Mild prostatomegaly. Other: Small umbilical and bilateral inguinal fat hernias. No incarcerated hernia. No free pelvic fluid. No free hemorrhage or free air. Musculoskeletal: Slight thoracic dextroscoliosis. Multilevel degenerative disc space loss lower thoracic spine, with degenerative disc disease and spondylosis at L4-5 and L5-S1 No acute or other significant osseous findings. No focal pathologic process. IMPRESSION: 1. 6 x 5 x 4 mm left UPJ obstructing stone with mild hydronephrosis and obstructive uropathy. Correlate clinically for infectious complication. 2. Mild left perinephric fluid which could be due to a caliceal leak or due to the obstructive uropathy. 3. 5 mm nonobstructive caliceal stone in the inferior pole of the left kidney. Bilateral Bosniak 1 cysts. 4. Moderate-sized hiatal hernia. 5. Aortic and coronary artery atherosclerosis. 6. Diverticulosis without evidence of diverticulitis. 7. Mild prostatomegaly. 8. Small umbilical and bilateral inguinal fat hernias. Aortic Atherosclerosis (ICD10-I70.0). Electronically Signed   By: Almira Bar M.D.   On: 11/01/2023 05:10     ------  Elmon Kirschner, NP Pager: [phone]   Please contact the urology consult pager with any further questions/concerns.

## 2023-11-01 NOTE — Transfer of Care (Signed)
Immediate Anesthesia Transfer of Care Note  Patient: Todd Valdez  Procedure(s) Performed: CYSTOSCOPY WITH RETROGRADE PYELOGRAM/URETERAL STENT PLACEMENT (Left)  Patient Location: PACU  Anesthesia Type:General  Level of Consciousness: awake, alert , oriented, and patient cooperative  Airway & Oxygen Therapy: Patient Spontanous Breathing and Patient connected to face mask oxygen  Post-op Assessment: Report given to RN, Post -op Vital signs reviewed and stable, and Patient moving all extremities  Post vital signs: Reviewed and stable  Last Vitals:  Vitals Value Taken Time  BP 115/95 11/01/23 1300  Temp    Pulse 109 11/01/23 1304  Resp 19 11/01/23 1304  SpO2 97 % 11/01/23 1304  Vitals shown include unfiled device data.  Last Pain:  Vitals:   11/01/23 1142  TempSrc:   PainSc: 0-No pain         Complications: No notable events documented.

## 2023-11-01 NOTE — Anesthesia Preprocedure Evaluation (Addendum)
Anesthesia Evaluation  Patient identified by MRN, date of birth, ID band Patient awake    Reviewed: Allergy & Precautions, NPO status , Patient's Chart, lab work & pertinent test results  History of Anesthesia Complications (+) DIFFICULT AIRWAY and history of anesthetic complications  Airway Mallampati: IV  TM Distance: >3 FB Neck ROM: Full    Dental no notable dental hx. (+) Teeth Intact, Dental Advisory Given   Pulmonary sleep apnea , former smoker   Pulmonary exam normal breath sounds clear to auscultation       Cardiovascular hypertension, Normal cardiovascular exam Rhythm:Regular Rate:Normal     Neuro/Psych    GI/Hepatic ,GERD  Medicated,,  Endo/Other    Renal/GU Lab Results      Component                Value               Date                      NA                       139                 11/01/2023                CL                       107                 11/01/2023                K                        4.0                 11/01/2023                CO2                      22                  11/01/2023                BUN                      27 (H)              11/01/2023                CREATININE               1.24                11/01/2023                GFRNONAA                 >60                 11/01/2023                CALCIUM                  9.6                 11/01/2023  ALBUMIN                  4.1                 11/01/2023                GLUCOSE                  127 (H)             11/01/2023                Musculoskeletal  (+) Arthritis , Rheumatoid disorders,    Abdominal   Peds  Hematology Lab Results      Component                Value               Date                      WBC                      22.1 (H)            11/01/2023                HGB                      14.8                11/01/2023                HCT                      43.4                 11/01/2023                MCV                      87.7                11/01/2023                PLT                      289                 11/01/2023              Anesthesia Other Findings   Reproductive/Obstetrics                             Anesthesia Physical Anesthesia Plan  ASA: 3  Anesthesia Plan: General   Post-op Pain Management:    Induction:   PONV Risk Score and Plan: Treatment may vary due to age or medical condition, Midazolam and Ondansetron  Airway Management Planned: LMA and Video Laryngoscope Planned  Additional Equipment: None  Intra-op Plan:   Post-operative Plan:   Informed Consent: I have reviewed the patients History and Physical, chart, labs and discussed the procedure including the risks, benefits and alternatives for the proposed anesthesia with the patient or authorized representative who has indicated his/her understanding and acceptance.     Dental advisory given  Plan Discussed with: CRNA and Anesthesiologist  Anesthesia Plan Comments:  Anesthesia Quick Evaluation

## 2023-11-01 NOTE — Op Note (Signed)
Preoperative diagnosis: Left proximal ureteral stone, SIRS Postoperative diagnosis: Same  Procedure: Cystoscopy with left retrograde pyelogram and left ureteral stent placement  Surgeon: Mena Goes  Anesthesia: General  Indication for procedure: Todd Valdez is a 63 year old male with a 6 mm left proximal ureteral stone and high-grade obstruction.  He also had tachycardia and possible UTI.  Brought for urgent stent.  Findings: On exam the penis was uncircumcised.  Foreskin was normal with no phimosis or lesion.  Glans and meatus appeared normal.  On cystoscopy the urethra and the prostate were unremarkable.  Bladder with mild trabeculation but no stone or foreign body.  No mucosal lesions.  Left retrograde pyelogram-this revealed a delayed nephrogram with contrast remaining in the collecting system renal pelvis and into the proximal ureter to the stone.  On retrograde injection this outlined a single ureter single collecting system unit with a small filling defect in the left proximal ureter consistent with the stone.  Description of procedure: After consent was obtained patient brought to the operating room.  After adequate anesthesia he is placed lithotomy position and prepped and draped in the usual sterile fashion.  Timeout was performed to confirm the patient and procedure.  Cystoscope was passed per urethra and the bladder inspected.  Left ureteral orifice was cannulated with a 6 [place] open-ended catheter and left retrograde injection of contrast was performed.  A sensor wire was advanced and coiled in the upper calyx.  A 6 x 26 cm stent was advanced.  The wire was removed with a good coil seen in the upper calyx and a good coil in the bladder.  The stent was draining briskly with contrast and some debris laden urine but no overt purulence.  I placed a 16 French Foley catheter for max drainage overnight.  He was then awakened and taken to the cover room in stable condition.  Complications:  None  Blood loss: Minimal  Specimens: None  Drains: 6 x 26 cm left ureteral stent  Disposition: Patient guarded to PACU (pressors during procedure).

## 2023-11-01 NOTE — ED Notes (Signed)
Pt reports he is starting to feel better and is not experiencing chills and shaking as he was before.

## 2023-11-01 NOTE — ED Notes (Signed)
Rocephin infusion stopped at this time due to pt symptoms and per MD.

## 2023-11-01 NOTE — Anesthesia Postprocedure Evaluation (Signed)
Anesthesia Post Note  Patient: Todd Valdez  Procedure(s) Performed: CYSTOSCOPY WITH RETROGRADE PYELOGRAM/URETERAL STENT PLACEMENT (Left)     Patient location during evaluation: PACU Anesthesia Type: General Level of consciousness: awake and alert Pain management: pain level controlled Vital Signs Assessment: post-procedure vital signs reviewed and stable Respiratory status: spontaneous breathing, nonlabored ventilation, respiratory function stable and patient connected to nasal cannula oxygen Cardiovascular status: blood pressure returned to baseline and stable Postop Assessment: no apparent nausea or vomiting Anesthetic complications: no   No notable events documented.  Last Vitals:  Vitals:   11/01/23 1600 11/01/23 1700  BP: 119/82 128/86  Pulse: 84 79  Resp: 16 20  Temp:    SpO2: 92% 93%    Last Pain:  Vitals:   11/01/23 1558  TempSrc:   PainSc: Asleep                 Trevor Iha

## 2023-11-01 NOTE — ED Notes (Signed)
Pt states @1830  last evening started having left flank pain with N/V (related to pain) Pt has no hx of kidney stones

## 2023-11-01 NOTE — ED Notes (Signed)
Pt states he can not void at this time.  

## 2023-11-01 NOTE — Progress Notes (Signed)
Hospitalist Transfer Note:    Nursing staff, Please call [facility] Admits & Consults System-Wide number on Amion [phone]) as soon as patient's arrival, so appropriate admitting provider can evaluate the pt.   Transferring facility: [facility] Requesting provider: Dr. Bebe Shaggy (EDP at [facility]) Reason for transfer: admission for further evaluation and management of obstructing left ureteral stone complicated by urinary tract infection.   63 year old male with history of rheumatoid arthritis on chronic immunosuppressive therapy, who presented to [facility] ED complaining of 1 day of new onset left-sided flank pain associated with nausea resulting in nonbloody, nonbilious emesis.  During his course of [facility], the patient is also developed rigors.   He has history of rheumatoid arthritis and is on chronic immunosuppressive therapy, including methotrexate as well as chronic prednisone therapy.  Vital signs in the ED were notable for the following: Afebrile; rates in the 90s; systolic blood pressures in the 140s to 150s; respiratory rate 18-20, oxygen saturation 96 to 98% on room air.  Labs were notable for CBC, which demonstrated with a cell count of 22,000.  Creatinine 1.24 compared to most recent prior value of 0.98 on 10/22/2023.  UA is reported to be suggestive of infection.  Imaging notable for CT abdomen/pelvis with contrast, which showed evidence of an obstructing 6 x 5 x 4 mm left UPJ stone with mild hydronephrosis.   Medications administered prior to transfer included the following: Rocephin, which was initially held after development of shortness of breath and rigors following initiation thereof.  The following workup is currently pending.  Blood cultures x 2, stat lactic acid level every 3 hours x 2 occurrences.  Chest x-ray is also pending.  EDP d/w on-call urology, Dr. Kasandra Knudsen, who recommended [facility] admission to [facility], where urology will formally consult and plans for left ureteral  stent placement today.  Subsequently, I accepted this patient for transfer for observation admission to a SDU bed at [facility] for further work-up and management of the above. SDU chosen given increased risk for ensuing decompensation given obstructing ureteral stone with underlying urinary tract infection in this patient who is chronically immunosuppressed while showing evidence of full body rigors at [facility] this evening.   Per my discussions with Dr. Bebe Shaggy, the hospitalist service will be updated if the patient decompensates prior to transfer, at which time considerations include potential direct transfer from [facility] to the OR at [facility].      Newton Pigg, DO Hospitalist

## 2023-11-02 ENCOUNTER — Encounter (HOSPITAL_COMMUNITY): Payer: Self-pay | Admitting: Urology

## 2023-11-02 ENCOUNTER — Observation Stay (HOSPITAL_COMMUNITY): Payer: Commercial Managed Care - PPO

## 2023-11-02 DIAGNOSIS — Z8249 Family history of ischemic heart disease and other diseases of the circulatory system: Secondary | ICD-10-CM | POA: Diagnosis not present

## 2023-11-02 DIAGNOSIS — E872 Acidosis, unspecified: Secondary | ICD-10-CM | POA: Diagnosis not present

## 2023-11-02 DIAGNOSIS — B9689 Other specified bacterial agents as the cause of diseases classified elsewhere: Secondary | ICD-10-CM | POA: Diagnosis present

## 2023-11-02 DIAGNOSIS — N400 Enlarged prostate without lower urinary tract symptoms: Secondary | ICD-10-CM | POA: Diagnosis not present

## 2023-11-02 DIAGNOSIS — D84821 Immunodeficiency due to drugs: Secondary | ICD-10-CM | POA: Diagnosis not present

## 2023-11-02 DIAGNOSIS — M069 Rheumatoid arthritis, unspecified: Secondary | ICD-10-CM | POA: Diagnosis present

## 2023-11-02 DIAGNOSIS — N3000 Acute cystitis without hematuria: Secondary | ICD-10-CM | POA: Diagnosis not present

## 2023-11-02 DIAGNOSIS — M48061 Spinal stenosis, lumbar region without neurogenic claudication: Secondary | ICD-10-CM | POA: Diagnosis present

## 2023-11-02 DIAGNOSIS — R9431 Abnormal electrocardiogram [ECG] [EKG]: Secondary | ICD-10-CM

## 2023-11-02 DIAGNOSIS — I25110 Atherosclerotic heart disease of native coronary artery with unstable angina pectoris: Secondary | ICD-10-CM | POA: Diagnosis not present

## 2023-11-02 DIAGNOSIS — K4020 Bilateral inguinal hernia, without obstruction or gangrene, not specified as recurrent: Secondary | ICD-10-CM | POA: Diagnosis present

## 2023-11-02 DIAGNOSIS — A419 Sepsis, unspecified organism: Secondary | ICD-10-CM | POA: Diagnosis present

## 2023-11-02 DIAGNOSIS — K2100 Gastro-esophageal reflux disease with esophagitis, without bleeding: Secondary | ICD-10-CM | POA: Diagnosis present

## 2023-11-02 DIAGNOSIS — N201 Calculus of ureter: Secondary | ICD-10-CM | POA: Diagnosis not present

## 2023-11-02 DIAGNOSIS — R739 Hyperglycemia, unspecified: Secondary | ICD-10-CM | POA: Diagnosis present

## 2023-11-02 DIAGNOSIS — Z85820 Personal history of malignant melanoma of skin: Secondary | ICD-10-CM | POA: Diagnosis not present

## 2023-11-02 DIAGNOSIS — Z87891 Personal history of nicotine dependence: Secondary | ICD-10-CM | POA: Diagnosis not present

## 2023-11-02 DIAGNOSIS — I2510 Atherosclerotic heart disease of native coronary artery without angina pectoris: Secondary | ICD-10-CM | POA: Diagnosis present

## 2023-11-02 DIAGNOSIS — N136 Pyonephrosis: Secondary | ICD-10-CM | POA: Diagnosis present

## 2023-11-02 DIAGNOSIS — E781 Pure hyperglyceridemia: Secondary | ICD-10-CM | POA: Diagnosis present

## 2023-11-02 DIAGNOSIS — G4730 Sleep apnea, unspecified: Secondary | ICD-10-CM | POA: Diagnosis present

## 2023-11-02 DIAGNOSIS — Z79899 Other long term (current) drug therapy: Secondary | ICD-10-CM | POA: Diagnosis not present

## 2023-11-02 DIAGNOSIS — J309 Allergic rhinitis, unspecified: Secondary | ICD-10-CM | POA: Diagnosis present

## 2023-11-02 DIAGNOSIS — K429 Umbilical hernia without obstruction or gangrene: Secondary | ICD-10-CM | POA: Diagnosis present

## 2023-11-02 DIAGNOSIS — I10 Essential (primary) hypertension: Secondary | ICD-10-CM | POA: Diagnosis present

## 2023-11-02 DIAGNOSIS — I700 Atherosclerosis of aorta: Secondary | ICD-10-CM | POA: Diagnosis not present

## 2023-11-02 LAB — CBC WITH DIFFERENTIAL/PLATELET
Abs Immature Granulocytes: 0.16 10*3/uL — ABNORMAL HIGH (ref 0.00–0.07)
Basophils Absolute: 0 10*3/uL (ref 0.0–0.1)
Basophils Relative: 0 %
Eosinophils Absolute: 0 10*3/uL (ref 0.0–0.5)
Eosinophils Relative: 0 %
HCT: 40.2 % (ref 39.0–52.0)
Hemoglobin: 13.1 g/dL (ref 13.0–17.0)
Immature Granulocytes: 1 %
Lymphocytes Relative: 2 %
Lymphs Abs: 0.5 10*3/uL — ABNORMAL LOW (ref 0.7–4.0)
MCH: 30.4 pg (ref 26.0–34.0)
MCHC: 32.6 g/dL (ref 30.0–36.0)
MCV: 93.3 fL (ref 80.0–100.0)
Monocytes Absolute: 0.9 10*3/uL (ref 0.1–1.0)
Monocytes Relative: 4 %
Neutro Abs: 22.4 10*3/uL — ABNORMAL HIGH (ref 1.7–7.7)
Neutrophils Relative %: 93 %
Platelets: 171 10*3/uL (ref 150–400)
RBC: 4.31 MIL/uL (ref 4.22–5.81)
RDW: 15.3 % (ref 11.5–15.5)
WBC: 24 10*3/uL — ABNORMAL HIGH (ref 4.0–10.5)
nRBC: 0 % (ref 0.0–0.2)

## 2023-11-02 LAB — COMPREHENSIVE METABOLIC PANEL
ALT: 23 U/L (ref 0–44)
AST: 20 U/L (ref 15–41)
Albumin: 3 g/dL — ABNORMAL LOW (ref 3.5–5.0)
Alkaline Phosphatase: 53 U/L (ref 38–126)
Anion gap: 10 (ref 5–15)
BUN: 22 mg/dL (ref 8–23)
CO2: 19 mmol/L — ABNORMAL LOW (ref 22–32)
Calcium: 8.5 mg/dL — ABNORMAL LOW (ref 8.9–10.3)
Chloride: 110 mmol/L (ref 98–111)
Creatinine, Ser: 0.94 mg/dL (ref 0.61–1.24)
GFR, Estimated: >60 mL/min (ref >60)
Glucose, Bld: 129 mg/dL — ABNORMAL HIGH (ref 70–99)
Potassium: 4.1 mmol/L (ref 3.5–5.1)
Sodium: 139 mmol/L (ref 135–145)
Total Bilirubin: 1.1 mg/dL (ref 0.0–1.2)
Total Protein: 5.5 g/dL — ABNORMAL LOW (ref 6.5–8.1)

## 2023-11-02 LAB — ECHOCARDIOGRAM COMPLETE
Area-P 1/2: 3.6 cm2
Calc EF: 65.4 %
Height: 73 in
S' Lateral: 2.6 cm
Single Plane A2C EF: 66.1 %
Single Plane A4C EF: 63.4 %
Weight: 3424 [oz_av]

## 2023-11-02 LAB — HIV ANTIBODY (ROUTINE TESTING W REFLEX): HIV Screen 4th Generation wRfx: NONREACTIVE

## 2023-11-02 MED ORDER — FOLIC ACID 1 MG PO TABS
1.0000 mg | ORAL_TABLET | Freq: Every day | ORAL | Status: DC
  Administered 2023-11-02 – 2023-11-03 (×2): 1 mg via ORAL
  Filled 2023-11-02 (×2): qty 1

## 2023-11-02 MED ORDER — ATORVASTATIN CALCIUM 40 MG PO TABS
40.0000 mg | ORAL_TABLET | Freq: Every day | ORAL | Status: DC
Start: 2023-11-02 — End: 2023-11-03
  Administered 2023-11-02: 40 mg via ORAL
  Filled 2023-11-02: qty 1

## 2023-11-02 MED ORDER — FENOFIBRATE 160 MG PO TABS
160.0000 mg | ORAL_TABLET | Freq: Every day | ORAL | Status: DC
  Administered 2023-11-02 – 2023-11-03 (×2): 160 mg via ORAL
  Filled 2023-11-02 (×2): qty 1

## 2023-11-02 MED ORDER — PANTOPRAZOLE SODIUM 40 MG PO TBEC
40.0000 mg | DELAYED_RELEASE_TABLET | Freq: Every day | ORAL | Status: DC
  Administered 2023-11-02 – 2023-11-03 (×2): 40 mg via ORAL
  Filled 2023-11-02 (×2): qty 1

## 2023-11-02 MED ORDER — ENOXAPARIN SODIUM 40 MG/0.4ML IJ SOSY
40.0000 mg | PREFILLED_SYRINGE | INTRAMUSCULAR | Status: DC
  Administered 2023-11-02 – 2023-11-03 (×2): 40 mg via SUBCUTANEOUS
  Filled 2023-11-02 (×2): qty 0.4

## 2023-11-02 MED ORDER — SODIUM CHLORIDE 0.9 % IV SOLN
2.0000 g | Freq: Three times a day (TID) | INTRAVENOUS | Status: DC
  Administered 2023-11-02 – 2023-11-03 (×4): 2 g via INTRAVENOUS
  Filled 2023-11-02 (×4): qty 12.5

## 2023-11-02 NOTE — Progress Notes (Signed)
  Echocardiogram 2D Echocardiogram has been performed.  Todd Valdez 11/02/2023, 11:01 AM

## 2023-11-02 NOTE — Progress Notes (Addendum)
PROGRESS NOTE    Todd Valdez  ZOX:[id number] DOB: Jul 08, 1961 DOA: 11/01/2023 PCP: Monica Becton, MD  Chief Complaint  Patient presents with   Flank Pain    Brief Narrative:   Todd Valdez is Todd Valdez 63 y.o. male with medical history not limited to rheumatoid arthritis, melanoma, diverticulitis, GERD, gastric outlet obstruction, hypertriglyceridemia, hypertension who presented to the emergency department sepsis due to Zlaty Alexa UTI with an obstructing stone.    Assessment & Plan:   Principal Problem:   Left ureteral stone Active Problems:   Rheumatoid arthritis (HCC)   Hypertension   Hyperlipidemia, predominantly triglycerides   Lumbar spinal stenosis   Sepsis secondary to UTI [facility])   Coronary atherosclerosis   Aortic atherosclerosis (HCC)   Prostate enlargement   Hyperglycemia   Umbilical hernia without obstruction or gangrene   Bilateral inguinal hernia without obstruction or gangrene   Abnormal electrocardiogram (ECG) (EKG)  Sepsis secondary to UTI Left Obstructing Ureteral Stone with Hydronephrosis CT at presentation with 6x5x4 mm L UPJ obstructing stone with mild hydro and obstructive uropathy Now s/p cystoscopy with L retrograde pyelogram and L ureteral stent placement Significant leukocytosis -> 24 today Blood cultures currently NG Urine culture growing >100,000 gram negative rods Continue ceftriaxone, await final culture results with speciation and sensitivities Foley per urology  Lactic Acidosis Due to sepsis - currently hemodynamically stable, mild NAGMA -> will defer further follow up unless clinically indicated  Oxygen Requirement Currently on 4 L, discussed with RN wean as tolerated If true hypoxia with O2 requirement, will need additional workup CXR at presentation, unremarkable   Rheumatoid Arthritis  Chronic Immunosuppression  Takes methotrexate and orencia weekly Takes prednisone 1 mg daily - would typically consider stress/sick dose steroids, but he's  currently well appearing and his dose of prednisone very low.  Will continue prednisone 1 mg daily for now, low threshold for stress/sick dose.   Abnormal EKG Follow echo  HLD Takes repatha, statin, fibrate  HTN Will hold antihypertensives for now with presentation with sepsis, BP ok today Holding lisinopril/hydrochlorothiazide  BPH Noted, follow with urology  Umbilical and Bilateral Inguinal Fat Hernias Noted, follow outpatient    DVT prophylaxis: SCD Code Status: full Family Communication: wife Disposition:   Status is: Observation The patient will require care spanning > 2 midnights and should be moved to inpatient because: need for continued inpatient care   Consultants:  urology  Procedures:  none  Antimicrobials:  Anti-infectives (From admission, onward)    Start     Dose/Rate Route Frequency Ordered Stop   11/02/23 0900  cefTRIAXone (ROCEPHIN) 2 g in sodium chloride 0.9 % 100 mL IVPB        2 g 200 mL/hr over 30 Minutes Intravenous Every 24 hours 11/01/23 0926 11/09/23 0859   11/01/23 1030  cefTRIAXone (ROCEPHIN) 1 g in sodium chloride 0.9 % 100 mL IVPB       Note to Pharmacy: Received 1 gr earlier. Will be over 100 Kg after IVF boluses and meds given.   1 g 200 mL/hr over 30 Minutes Intravenous  Once 11/01/23 0926 11/01/23 1115   11/01/23 0645  ciprofloxacin (CIPRO) IVPB 400 mg        400 mg 200 mL/hr over 60 Minutes Intravenous  Once 11/01/23 0641 11/01/23 0804   11/01/23 0545  cefTRIAXone (ROCEPHIN) 1 g in sodium chloride 0.9 % 100 mL IVPB        1 g 200 mL/hr over 30 Minutes Intravenous  Once 11/01/23 0533 11/01/23 0555  Subjective: Asking if he can discharge today Denies any complaints  Objective: Vitals:   11/02/23 0300 11/02/23 0400 11/02/23 0500 11/02/23 0600  BP: 123/72 118/71 119/74 131/81  Pulse: 74 72 71 79  Resp: 16 18 19 20   Temp:  97.7 F (36.5 C)    TempSrc:  Oral    SpO2: 92% 94% 94% 91%  Weight:      Height:         Intake/Output Summary (Last 24 hours) at 11/02/2023 0843 Last data filed at 11/02/2023 0545 Gross per 24 hour  Intake 5369.51 ml  Output 1775 ml  Net 3594.51 ml   Filed Weights   11/01/23 0239  Weight: 97.1 kg    Examination:  General exam: Appears calm and comfortable  Respiratory system: unlabored, anterior exam without adventitious lung sounds Cardiovascular system: RRR Gastrointestinal system: distended, nontender Central nervous system: Alert and oriented. No focal neurological deficits. Extremities: no LEE, R transmet amputation   Data Reviewed: I have personally reviewed following labs and imaging studies  CBC: Recent Labs  Lab 11/01/23 0247 11/02/23 0252  WBC 22.1* 24.0*  NEUTROABS 17.0* 22.4*  HGB 14.8 13.1  HCT 43.4 40.2  MCV 87.7 93.3  PLT 289 171    Basic Metabolic Panel: Recent Labs  Lab 11/01/23 0247 11/02/23 0252  NA 139 139  K 4.0 4.1  CL 107 110  CO2 22 19*  GLUCOSE 127* 129*  BUN 27* 22  CREATININE 1.24 0.94  CALCIUM 9.6 8.5*    GFR: Estimated Creatinine Clearance: 97.5 mL/min (by C-G formula based on SCr of 0.94 mg/dL).  Liver Function Tests: Recent Labs  Lab 11/01/23 0247 11/02/23 0252  AST 21 20  ALT 22 23  ALKPHOS 74 53  BILITOT 0.9 1.1  PROT 6.9 5.5*  ALBUMIN 4.1 3.0*    CBG: No results for input(s): "GLUCAP" in the last 168 hours.   Recent Results (from the past 240 hours)  Urine Culture     Status: Abnormal (Preliminary result)   Collection Time: 11/01/23  5:10 AM   Specimen: Urine, Clean Catch  Result Value Ref Range Status   Specimen Description   Final    URINE, CLEAN CATCH Performed at [facility], [address] [place], [place] [address]    Special Requests   Final    NONE Performed at [facility], [address] [place], [place] [address]    Culture (Cheryn Lundquist)  Final    >=100,000 COLONIES/mL GRAM NEGATIVE RODS SUSCEPTIBILITIES TO FOLLOW Performed at [facility],  [address], [place], [place] [address]    Report Status PENDING  Incomplete  Blood Culture (routine x 2)     Status: None (Preliminary result)   Collection Time: 11/01/23  6:43 AM   Specimen: BLOOD LEFT WRIST  Result Value Ref Range Status   Specimen Description   Final    BLOOD LEFT WRIST Performed at [facility], [address] [place], [place] [address]    Special Requests   Final    BOTTLES DRAWN AEROBIC AND ANAEROBIC Blood Culture results may not be optimal due to an inadequate volume of blood received in culture bottles Performed at [facility], [address] [place], [place] [address]    Culture   Final    NO GROWTH < 12 HOURS Performed at [facility], [address] [place], [place] [address]    Report Status  PENDING  Incomplete  Blood Culture (routine x 2)     Status: None (Preliminary result)   Collection Time: 11/01/23  6:43 AM   Specimen: BLOOD LEFT ARM  Result Value Ref Range Status   Specimen Description   Final    BLOOD LEFT ARM Performed at [facility], [address] [place], [place] [address]    Special Requests   Final    BOTTLES DRAWN AEROBIC AND ANAEROBIC Blood Culture results may not be optimal due to an inadequate volume of blood received in culture bottles Performed at [facility], [address] [place], [place] [address]    Culture   Final    NO GROWTH < 12 HOURS Performed at [facility], [address], [place], [place] [address]    Report Status PENDING  Incomplete  MRSA Next Gen by PCR, Nasal     Status: None   Collection Time: 11/01/23  9:02 AM   Specimen: Nasal Mucosa; Nasal Swab  Result Value Ref Range Status   MRSA by PCR Next Gen NOT DETECTED NOT DETECTED Final    Comment: (NOTE) The GeneXpert MRSA Assay (FDA approved for NASAL specimens only), is one component of Sakib Noguez comprehensive MRSA colonization surveillance program. It is not intended to diagnose MRSA infection nor to  guide or monitor treatment for MRSA infections. Test performance is not FDA approved in patients less than 79 years old. Performed at [facility], [address] [place], [place] [address]          Radiology Studies: DG C-Arm 1-60 Min-No Report Result Date: 11/01/2023 Fluoroscopy was utilized by the requesting physician.  No radiographic interpretation.   DG Chest Port 1 View Result Date: 11/01/2023 CLINICAL DATA:  Hypertension and sleep apnea.  Possible sepsis. EXAM: PORTABLE CHEST 1 VIEW COMPARISON:  10/22/2023 FINDINGS: Low volume film. The lungs are clear without focal pneumonia, edema, pneumothorax or pleural effusion. Cardiopericardial silhouette is at upper limits of normal for size. No acute bony abnormality. Telemetry leads overlie the chest. IMPRESSION: Low volume film without acute cardiopulmonary findings. Electronically Signed   By: Kennith Center M.D.   On: 11/01/2023 06:39   CT ABDOMEN PELVIS W CONTRAST Result Date: 11/01/2023 CLINICAL DATA:  Left flank pain, nausea and vomiting. EXAM: CT ABDOMEN AND PELVIS WITH CONTRAST TECHNIQUE: Multidetector CT imaging of the abdomen and pelvis was performed using the standard protocol following bolus administration of intravenous contrast. RADIATION DOSE REDUCTION: This exam was performed according to the departmental dose-optimization program which includes automated exposure control, adjustment of the mA and/or kV according to patient size and/or use of iterative reconstruction technique. CONTRAST:  OMNIPAQUE IOHEXOL 300 MG/ML  SOLN COMPARISON:  None Available. FINDINGS: Lower chest: Moderate-sized hiatal hernia with about 1/3 of the stomach intrathoracic. The cardiac size is normal. There is trace calcification in the right coronary artery. There is subpleural reticulation in the right-greater-than-left lower lobes. No acute lung base infiltrates. Elevated right hemidiaphragm. Hepatobiliary: The liver is 19 cm  length with mild steatosis. There is no mass enhancement. The gallbladder and bile ducts are unremarkable. Pancreas: No abnormality. Spleen: No abnormality. No splenomegaly. Small splenule posteriorly. Adrenals/Urinary Tract: 6 x 5 x 4 mm left UPJ obstructing stone, with mild hydronephrosis. There is mild left perinephric fluid which could be due to Jayle Solarz caliceal leak or due to obstructive uropathy. Asymmetric cortical contrast retention noted in the left kidney and lack of contrast excretion on the  delayed images consistent with obstructive uropathy. There is Walterine Amodei 5 mm nonobstructive caliceal stone in the inferior pole of the kidney. No right nephrolithiasis is seen prior There is no adrenal mass. On the right there is Payzlee Ryder Bosniak 1 cyst in the superior pole measuring 1 cm, 18 Hounsfield units, and Dimitry Holsworth 7 mm hypodense too small to characterize Bosniak 2 cyst in the inferior pole. On the left, there is Aadil Sur 2 cm Bosniak 1 cyst in the upper pole, Hounsfield density 13.2, and Janica Eldred 1.2 cm Bosniak 1 cyst in the anterior hilar cortex with Hounsfield density of 15. There is no solid mass enhancement in either kidney. No follow-up imaging is recommended. There is no bladder thickening. Stomach/Bowel: No dilatation or wall thickening including the appendix. There is sigmoid diverticulosis without evidence of diverticulitis. Vascular/Lymphatic: Aortic atherosclerosis. No enlarged abdominal or pelvic lymph nodes. Reproductive: Mild prostatomegaly. Other: Small umbilical and bilateral inguinal fat hernias. No incarcerated hernia. No free pelvic fluid. No free hemorrhage or free air. Musculoskeletal: Slight thoracic dextroscoliosis. Multilevel degenerative disc space loss lower thoracic spine, with degenerative disc disease and spondylosis at L4-5 and L5-S1 No acute or other significant osseous findings. No focal pathologic process. IMPRESSION: 1. 6 x 5 x 4 mm left UPJ obstructing stone with mild hydronephrosis and obstructive uropathy.  Correlate clinically for infectious complication. 2. Mild left perinephric fluid which could be due to Georga Stys caliceal leak or due to the obstructive uropathy. 3. 5 mm nonobstructive caliceal stone in the inferior pole of the left kidney. Bilateral Bosniak 1 cysts. 4. Moderate-sized hiatal hernia. 5. Aortic and coronary artery atherosclerosis. 6. Diverticulosis without evidence of diverticulitis. 7. Mild prostatomegaly. 8. Small umbilical and bilateral inguinal fat hernias. Aortic Atherosclerosis (ICD10-I70.0). Electronically Signed   By: Almira Bar M.D.   On: 11/01/2023 05:10        Scheduled Meds:  Chlorhexidine Gluconate Cloth  6 each Topical Daily   Continuous Infusions:  cefTRIAXone (ROCEPHIN)  IV       LOS: 0 days    Time spent: over 30 min    Lacretia Nicks, MD [facility]   To contact the attending provider between 7A-7P or the covering provider during after hours 7P-7A, please log into the web site [email or web address] and access using universal [place] password for that web site. If you do not have the password, please call the hospital operator.  11/02/2023, 8:43 AM

## 2023-11-02 NOTE — Care Management (Signed)
Transition of Care [facility]) - Inpatient Brief Assessment   Patient Details  Name: Tawfiq Favila MRN: [id number] Date of Birth: Dec 01, 1960  Transition of Care [facility]) CM/SW Contact:    Lavenia Atlas, RN Phone Number: 11/02/2023, 3:25 PM   Clinical Narrative: Per chart review patient currently in [facility] SDU for left ureteral stone, on 1L [place].   Transition of Care [facility]) Department has reviewed patient and no TOC needs have been identified at this time. We will continue to monitor patient advancement through Interdisciplinary progressions and if new patient needs arise, please place a consult.    Transition of Care Asessment: Insurance and Status: Insurance coverage has been reviewed Patient has primary care physician: Yes Home environment has been reviewed: from home with spouse Prior level of function:: independent Prior/Current Home Services: No current home services Social Drivers of Health Review: SDOH reviewed no interventions necessary Readmission risk has been reviewed: Yes Transition of care needs: no transition of care needs at this time

## 2023-11-02 NOTE — Progress Notes (Addendum)
1 Day Post-Op Subjective: Pt resting in bed accompanied by his wife. He feels better and looks to be at baseline. NAEON. Clear yellow urine on the darker side.   Objective: Vital signs in last 24 hours: Temp:  [96.8 F (36 C)-99.6 F (37.6 C)] 97.6 F (36.4 C) (01/24 0800) Pulse Rate:  [64-103] 82 (01/24 1300) Resp:  [12-27] 21 (01/24 1300) BP: (108-137)/(61-89) 130/74 (01/24 1300) SpO2:  [88 %-95 %] 94 % (01/24 1300)  Assessment/Plan: #59mm left proximal ureteral stone To the OR with Dr. Mena Goes for urgent ureteral stent placement 11/01/23.  Will need to schedule definitive stone mgmt on an outpt basis.   Urinalysis Klebsiella positive. Susceptibilities pending. Recommend at least 2 weeks of culture directed ABX before prior to definitive stone mgmt on an outpt basis.   Interval elevation of leukocytosis, likely reactive. Recommended staying in hospital for additional ABX and monitoring until labs start trending down. He is amenable.   Normothermic overnight  Foley catheter out this afternoon.  Will follow  Intake/Output from previous day: 01/23 0701 - 01/24 0700 In: 5568.2 [P.O.:250; I.V.:3021.3; IV Piggyback:2297] Out: 1775 [Urine:1775]  Intake/Output this shift: Total I/O In: 26.3 [IV Piggyback:26.3] Out: 350 [Urine:350]  Physical Exam:  General: Alert and oriented CV: No cyanosis Lungs: equal chest rise Abdomen: Soft, NTND, no rebound or guarding Gu: Foley in place draining clear concentrated yellow urine  Lab Results: Recent Labs    11/01/23 0247 11/02/23 0252  HGB 14.8 13.1  HCT 43.4 40.2   BMET Recent Labs    11/01/23 0247 11/02/23 0252  NA 139 139  K 4.0 4.1  CL 107 110  CO2 22 19*  GLUCOSE 127* 129*  BUN 27* 22  CREATININE 1.24 0.94  CALCIUM 9.6 8.5*     Studies/Results: DG C-Arm 1-60 Min-No Report Result Date: 11/01/2023 Fluoroscopy was utilized by the requesting physician.  No radiographic interpretation.   DG Chest Port 1  View Result Date: 11/01/2023 CLINICAL DATA:  Hypertension and sleep apnea.  Possible sepsis. EXAM: PORTABLE CHEST 1 VIEW COMPARISON:  10/22/2023 FINDINGS: Low volume film. The lungs are clear without focal pneumonia, edema, pneumothorax or pleural effusion. Cardiopericardial silhouette is at upper limits of normal for size. No acute bony abnormality. Telemetry leads overlie the chest. IMPRESSION: Low volume film without acute cardiopulmonary findings. Electronically Signed   By: Kennith Center M.D.   On: 11/01/2023 06:39   CT ABDOMEN PELVIS W CONTRAST Result Date: 11/01/2023 CLINICAL DATA:  Left flank pain, nausea and vomiting. EXAM: CT ABDOMEN AND PELVIS WITH CONTRAST TECHNIQUE: Multidetector CT imaging of the abdomen and pelvis was performed using the standard protocol following bolus administration of intravenous contrast. RADIATION DOSE REDUCTION: This exam was performed according to the departmental dose-optimization program which includes automated exposure control, adjustment of the mA and/or kV according to patient size and/or use of iterative reconstruction technique. CONTRAST:  OMNIPAQUE IOHEXOL 300 MG/ML  SOLN COMPARISON:  None Available. FINDINGS: Lower chest: Moderate-sized hiatal hernia with about 1/3 of the stomach intrathoracic. The cardiac size is normal. There is trace calcification in the right coronary artery. There is subpleural reticulation in the right-greater-than-left lower lobes. No acute lung base infiltrates. Elevated right hemidiaphragm. Hepatobiliary: The liver is 19 cm length with mild steatosis. There is no mass enhancement. The gallbladder and bile ducts are unremarkable. Pancreas: No abnormality. Spleen: No abnormality. No splenomegaly. Small splenule posteriorly. Adrenals/Urinary Tract: 6 x 5 x 4 mm left UPJ obstructing stone, with mild hydronephrosis. There  is mild left perinephric fluid which could be due to a caliceal leak or due to obstructive uropathy. Asymmetric  cortical contrast retention noted in the left kidney and lack of contrast excretion on the delayed images consistent with obstructive uropathy. There is a 5 mm nonobstructive caliceal stone in the inferior pole of the kidney. No right nephrolithiasis is seen prior There is no adrenal mass. On the right there is a Bosniak 1 cyst in the superior pole measuring 1 cm, 18 Hounsfield units, and a 7 mm hypodense too small to characterize Bosniak 2 cyst in the inferior pole. On the left, there is a 2 cm Bosniak 1 cyst in the upper pole, Hounsfield density 13.2, and a 1.2 cm Bosniak 1 cyst in the anterior hilar cortex with Hounsfield density of 15. There is no solid mass enhancement in either kidney. No follow-up imaging is recommended. There is no bladder thickening. Stomach/Bowel: No dilatation or wall thickening including the appendix. There is sigmoid diverticulosis without evidence of diverticulitis. Vascular/Lymphatic: Aortic atherosclerosis. No enlarged abdominal or pelvic lymph nodes. Reproductive: Mild prostatomegaly. Other: Small umbilical and bilateral inguinal fat hernias. No incarcerated hernia. No free pelvic fluid. No free hemorrhage or free air. Musculoskeletal: Slight thoracic dextroscoliosis. Multilevel degenerative disc space loss lower thoracic spine, with degenerative disc disease and spondylosis at L4-5 and L5-S1 No acute or other significant osseous findings. No focal pathologic process. IMPRESSION: 1. 6 x 5 x 4 mm left UPJ obstructing stone with mild hydronephrosis and obstructive uropathy. Correlate clinically for infectious complication. 2. Mild left perinephric fluid which could be due to a caliceal leak or due to the obstructive uropathy. 3. 5 mm nonobstructive caliceal stone in the inferior pole of the left kidney. Bilateral Bosniak 1 cysts. 4. Moderate-sized hiatal hernia. 5. Aortic and coronary artery atherosclerosis. 6. Diverticulosis without evidence of diverticulitis. 7. Mild prostatomegaly.  8. Small umbilical and bilateral inguinal fat hernias. Aortic Atherosclerosis (ICD10-I70.0). Electronically Signed   By: Almira Bar M.D.   On: 11/01/2023 05:10      LOS: 0 days   ATTENDING ATTESTATION: Patient seen and examined on rounds this afternoon. Agree with NP Cam assessment and plan. Discussed with patient I will turn in scheduling sheet for OR. Also again rationale for staged procedure.   Elmon Kirschner, NP [facility] Pager: [phone]  11/02/2023, 1:14 PM

## 2023-11-02 NOTE — Plan of Care (Signed)
  Problem: Clinical Measurements: Goal: Cardiovascular complication will be avoided Outcome: Progressing   Problem: Activity: Goal: Risk for activity intolerance will decrease Outcome: Progressing   Problem: Coping: Goal: Level of anxiety will decrease Outcome: Progressing   Problem: Elimination: Goal: Will not experience complications related to urinary retention Outcome: Progressing   Problem: Pain Managment: Goal: General experience of comfort will improve and/or be controlled Outcome: Progressing

## 2023-11-03 DIAGNOSIS — N201 Calculus of ureter: Secondary | ICD-10-CM | POA: Diagnosis not present

## 2023-11-03 LAB — COMPREHENSIVE METABOLIC PANEL
ALT: 39 U/L (ref 0–44)
AST: 40 U/L (ref 15–41)
Albumin: 2.7 g/dL — ABNORMAL LOW (ref 3.5–5.0)
Alkaline Phosphatase: 73 U/L (ref 38–126)
Anion gap: 9 (ref 5–15)
BUN: 18 mg/dL (ref 8–23)
CO2: 21 mmol/L — ABNORMAL LOW (ref 22–32)
Calcium: 9 mg/dL (ref 8.9–10.3)
Chloride: 108 mmol/L (ref 98–111)
Creatinine, Ser: 1.15 mg/dL (ref 0.61–1.24)
GFR, Estimated: >60 mL/min (ref >60)
Glucose, Bld: 119 mg/dL — ABNORMAL HIGH (ref 70–99)
Potassium: 3.6 mmol/L (ref 3.5–5.1)
Sodium: 138 mmol/L (ref 135–145)
Total Bilirubin: 1.1 mg/dL (ref 0.0–1.2)
Total Protein: 5.7 g/dL — ABNORMAL LOW (ref 6.5–8.1)

## 2023-11-03 LAB — URINE CULTURE: Culture: >=100000 — AB

## 2023-11-03 LAB — CBC
HCT: 41.8 % (ref 39.0–52.0)
Hemoglobin: 13.5 g/dL (ref 13.0–17.0)
MCH: 30 pg (ref 26.0–34.0)
MCHC: 32.3 g/dL (ref 30.0–36.0)
MCV: 92.9 fL (ref 80.0–100.0)
Platelets: 139 10*3/uL — ABNORMAL LOW (ref 150–400)
RBC: 4.5 MIL/uL (ref 4.22–5.81)
RDW: 14.7 % (ref 11.5–15.5)
WBC: 8.3 10*3/uL (ref 4.0–10.5)
nRBC: 0 % (ref 0.0–0.2)

## 2023-11-03 MED ORDER — SULFAMETHOXAZOLE-TRIMETHOPRIM 800-160 MG PO TABS: 1.0000 | ORAL_TABLET | Freq: Two times a day (BID) | ORAL | 0 refills | Status: AC

## 2023-11-03 MED ORDER — PREDNISONE 1 MG PO TABS: 1.0000 mg | ORAL_TABLET | Freq: Every day | ORAL | Status: DC

## 2023-11-03 MED ORDER — PREDNISONE 1 MG PO TABS
1.0000 mg | ORAL_TABLET | Freq: Every day | ORAL | Status: DC
  Administered 2023-11-03: 1 mg via ORAL
  Filled 2023-11-03: qty 1

## 2023-11-03 MED ORDER — SULFAMETHOXAZOLE-TRIMETHOPRIM 800-160 MG PO TABS
1.0000 | ORAL_TABLET | Freq: Two times a day (BID) | ORAL | Status: DC
  Administered 2023-11-03: 1 via ORAL
  Filled 2023-11-03 (×2): qty 1

## 2023-11-03 NOTE — Progress Notes (Signed)
PROGRESS NOTE    Demarious Kapur  EAV:[id number] DOB: [birth year] DOA: 11/01/2023 PCP: Monica Becton, MD  Chief Complaint  Patient presents with   Flank Pain    Brief Narrative:   Deveon Kisiel is Yoona Ishii 63 y.o. male with medical history not limited to rheumatoid arthritis, melanoma, diverticulitis, GERD, gastric outlet obstruction, hypertriglyceridemia, hypertension who presented to the emergency department sepsis due to Keng Jewel UTI with an obstructing stone.    Assessment & Plan:   Principal Problem:   Left ureteral stone Active Problems:   Rheumatoid arthritis (HCC)   Hypertension   Hyperlipidemia, predominantly triglycerides   Lumbar spinal stenosis   Sepsis secondary to UTI [facility])   Coronary atherosclerosis   Aortic atherosclerosis (HCC)   Prostate enlargement   Hyperglycemia   Umbilical hernia without obstruction or gangrene   Bilateral inguinal hernia without obstruction or gangrene   Abnormal electrocardiogram (ECG) (EKG)   Sepsis (HCC)  Sepsis secondary to UTI Left Obstructing Ureteral Stone with Hydronephrosis CT at presentation with 6x5x4 mm L UPJ obstructing stone with mild hydro and obstructive uropathy Now s/p cystoscopy with L retrograde pyelogram and L ureteral stent placement Significant leukocytosis -> 24 1/24, awaiting labs from 1/25.  Fever 1/25 am.   Blood cultures currently NG Urine culture growing klebsiella aerogenes -> sensitive to bactrim  Urology recommending definitive stone management as outpatient, 2 weeks culture directed abx  Lactic Acidosis Due to sepsis - currently hemodynamically stable, mild NAGMA -> will defer further follow up unless clinically indicated  Oxygen Requirement Currently on 4 L, discussed with RN wean as tolerated If true hypoxia with O2 requirement, will need additional workup CXR at presentation, unremarkable   Rheumatoid Arthritis  Chronic Immunosuppression  Takes methotrexate and orencia weekly Takes prednisone 1 mg daily  - would typically consider stress/sick dose steroids, but he's currently well appearing and his dose of prednisone very low.  Will continue prednisone 1 mg daily for now, low threshold for stress/sick dose.   Abnormal EKG Follow echo  HLD Takes repatha, statin, fibrate  HTN Will hold antihypertensives for now with presentation with sepsis, BP ok today Holding lisinopril/hydrochlorothiazide  BPH Noted, follow with urology  Umbilical and Bilateral Inguinal Fat Hernias Noted, follow outpatient    DVT prophylaxis: SCD Code Status: full Family Communication: wife Disposition:   Status is: Observation The patient will require care spanning > 2 midnights and should be moved to inpatient because: need for continued inpatient care   Consultants:  urology  Procedures:  none  Antimicrobials:  Anti-infectives (From admission, onward)    Start     Dose/Rate Route Frequency Ordered Stop   11/03/23 1515  sulfamethoxazole-trimethoprim (BACTRIM DS) 800-160 MG per tablet 1 tablet        1 tablet Oral Every 12 hours 11/03/23 1425     11/02/23 1400  ceFEPIme (MAXIPIME) 2 g in sodium chloride 0.9 % 100 mL IVPB  Status:  Discontinued        2 g 200 mL/hr over 30 Minutes Intravenous Every 8 hours 11/02/23 1247 11/03/23 1425   11/02/23 0900  cefTRIAXone (ROCEPHIN) 2 g in sodium chloride 0.9 % 100 mL IVPB  Status:  Discontinued        2 g 200 mL/hr over 30 Minutes Intravenous Every 24 hours 11/01/23 0926 11/02/23 1247   11/01/23 1030  cefTRIAXone (ROCEPHIN) 1 g in sodium chloride 0.9 % 100 mL IVPB       Note to Pharmacy: Received 1 gr earlier. Will be  over 100 Kg after IVF boluses and meds given.   1 g 200 mL/hr over 30 Minutes Intravenous  Once 11/01/23 0926 11/01/23 1115   11/01/23 0645  ciprofloxacin (CIPRO) IVPB 400 mg        400 mg 200 mL/hr over 60 Minutes Intravenous  Once 11/01/23 0641 11/01/23 0804   11/01/23 0545  cefTRIAXone (ROCEPHIN) 1 g in sodium chloride 0.9 % 100 mL IVPB         1 g 200 mL/hr over 30 Minutes Intravenous  Once 11/01/23 0533 11/01/23 0555       Subjective: Asking again about discharge  Objective: Vitals:   11/02/23 1804 11/03/23 0455 11/03/23 0609 11/03/23 1200  BP: 132/80 118/81    Pulse: 90 100    Resp: 17 17    Temp: 98.4 F (36.9 C) (!) 100.9 F (38.3 C) 99.2 F (37.3 C) 97.8 F (36.6 C)  TempSrc:  Oral Oral Oral  SpO2: 99% 93% 94% 97%  Weight:      Height:        Intake/Output Summary (Last 24 hours) at 11/03/2023 1428 Last data filed at 11/03/2023 1346 Gross per 24 hour  Intake 1137.62 ml  Output 2200 ml  Net -1062.38 ml   Filed Weights   11/01/23 0239  Weight: 97.1 kg    Examination:  General: No acute distress. Cardiovascular: RRR Lungs: unlabored Abdomen: Soft, nontender, nondistended Neurological: Alert and oriented 3. Moves all extremities 4 with equal strength. Cranial nerves II through XII grossly intact. Extremities: R transmet amputation    Data Reviewed: I have personally reviewed following labs and imaging studies  CBC: Recent Labs  Lab 11/01/23 0247 11/02/23 0252  WBC 22.1* 24.0*  NEUTROABS 17.0* 22.4*  HGB 14.8 13.1  HCT 43.4 40.2  MCV 87.7 93.3  PLT 289 171    Basic Metabolic Panel: Recent Labs  Lab 11/01/23 0247 11/02/23 0252  NA 139 139  K 4.0 4.1  CL 107 110  CO2 22 19*  GLUCOSE 127* 129*  BUN 27* 22  CREATININE 1.24 0.94  CALCIUM 9.6 8.5*    GFR: Estimated Creatinine Clearance: 97.5 mL/min (by C-G formula based on SCr of 0.94 mg/dL).  Liver Function Tests: Recent Labs  Lab 11/01/23 0247 11/02/23 0252  AST 21 20  ALT 22 23  ALKPHOS 74 53  BILITOT 0.9 1.1  PROT 6.9 5.5*  ALBUMIN 4.1 3.0*    CBG: No results for input(s): "GLUCAP" in the last 168 hours.   Recent Results (from the past 240 hours)  Urine Culture     Status: Abnormal   Collection Time: 11/01/23  5:10 AM   Specimen: Urine, Clean Catch  Result Value Ref Range Status   Specimen  Description   Final    URINE, CLEAN CATCH Performed at [facility], [address] [place], [place] [address]    Special Requests   Final    NONE Performed at [facility], [address] [place], [place] [address]    Culture >=100,000 COLONIES/mL KLEBSIELLA AEROGENES (Haruo Stepanek)  Final   Report Status 11/03/2023 FINAL  Final   Organism ID, Bacteria KLEBSIELLA AEROGENES (Kingsley Herandez)  Final      Susceptibility   Klebsiella aerogenes - MIC*    CEFEPIME <=0.12 SENSITIVE Sensitive     CEFTRIAXONE <=0.25 SENSITIVE Sensitive     CIPROFLOXACIN <=0.25 SENSITIVE Sensitive     GENTAMICIN <=1 SENSITIVE Sensitive     IMIPENEM 1 SENSITIVE Sensitive  NITROFURANTOIN 64 INTERMEDIATE Intermediate     TRIMETH/SULFA <=20 SENSITIVE Sensitive     PIP/TAZO 32 INTERMEDIATE Intermediate ug/mL    * >=100,000 COLONIES/mL KLEBSIELLA AEROGENES  Blood Culture (routine x 2)     Status: None (Preliminary result)   Collection Time: 11/01/23  6:43 AM   Specimen: BLOOD LEFT WRIST  Result Value Ref Range Status   Specimen Description   Final    BLOOD LEFT WRIST Performed at [facility], [address] [place], [place] [address]    Special Requests   Final    BOTTLES DRAWN AEROBIC AND ANAEROBIC Blood Culture results may not be optimal due to an inadequate volume of blood received in culture bottles Performed at [facility], [address] [place], [place] [address]    Culture   Final    NO GROWTH 2 DAYS Performed at [facility], [address], [place], [place] [address]    Report Status PENDING  Incomplete  Blood Culture (routine x 2)     Status: None (Preliminary result)   Collection Time: 11/01/23  6:43 AM   Specimen: BLOOD LEFT ARM  Result Value Ref Range Status   Specimen Description   Final    BLOOD LEFT ARM Performed at [facility], [address] [place], [place] [address]    Special Requests   Final    BOTTLES DRAWN AEROBIC AND ANAEROBIC  Blood Culture results may not be optimal due to an inadequate volume of blood received in culture bottles Performed at [facility], [address] [place], [place] [address]    Culture   Final    NO GROWTH 2 DAYS Performed at [facility], [address], [place], [place] [address]    Report Status PENDING  Incomplete  MRSA Next Gen by PCR, Nasal     Status: None   Collection Time: 11/01/23  9:02 AM   Specimen: Nasal Mucosa; Nasal Swab  Result Value Ref Range Status   MRSA by PCR Next Gen NOT DETECTED NOT DETECTED Final    Comment: (NOTE) The GeneXpert MRSA Assay (FDA approved for NASAL specimens only), is one component of [place] comprehensive MRSA colonization surveillance program. It is not intended to diagnose MRSA infection nor to guide or monitor treatment for MRSA infections. Test performance is not FDA approved in patients less than 66 years old. Performed at [facility], [address], [place], [place] [address]          Radiology Studies: ECHOCARDIOGRAM COMPLETE Result Date: 11/02/2023    ECHOCARDIOGRAM REPORT   Patient Name:   CECILE GUEVARA Date of Exam: 11/02/2023 Medical Rec #:  [id number]  Height:       73.0 in Accession #:    [id number] Weight:       214.0 lb Date of Birth:  1961-08-07  BSA:          2.214 m Patient Age:    62 years   BP:           131/81 mmHg Patient Gender: M          HR:           93 bpm. Exam Location:  Inpatient Procedure: 2D Echo, Cardiac Doppler and Color Doppler Indications:    I25.110 Atherosclerotic heart disease of native coronary artery  with unstable angina pectoris; R94.31 Abnormal EKG  History:        Patient has no prior history of Echocardiogram examinations.                 Abnormal ECG; Risk Factors:Dyslipidemia and Hypertension.  Sonographer:    Sheralyn Boatman RDCS Referring Phys: [phone] MATTHEW ESKRIDGE  Sonographer Comments: Technically difficult study due to poor echo windows. Image acquisition  challenging due to patient body habitus. IMPRESSIONS  1. Left ventricular ejection fraction, by estimation, is 65 to 70%. The left ventricle has normal function. The left ventricle has no regional wall motion abnormalities. The left ventricular internal cavity size was mildly dilated. There is mild concentric left ventricular hypertrophy. Left ventricular diastolic parameters are consistent with Grade I diastolic dysfunction (impaired relaxation).  2. Right ventricular systolic function is normal. The right ventricular size is normal. There is normal pulmonary artery systolic pressure.  3. Left atrial size was mildly dilated.  4. Right atrial size was mildly dilated.  5. The mitral valve is grossly normal. Trivial mitral valve regurgitation. No evidence of mitral stenosis.  6. The aortic valve is grossly normal. Aortic valve regurgitation is not visualized. No aortic stenosis is present.  7. The inferior vena cava is dilated in size with <50% respiratory variability, suggesting right atrial pressure of 15 mmHg. Comparison(s): No prior Echocardiogram. FINDINGS  Left Ventricle: Left ventricular ejection fraction, by estimation, is 65 to 70%. The left ventricle has normal function. The left ventricle has no regional wall motion abnormalities. The left ventricular internal cavity size was mildly dilated. There is  mild concentric left ventricular hypertrophy. Left ventricular diastolic parameters are consistent with Grade I diastolic dysfunction (impaired relaxation). Normal left ventricular filling pressure. Right Ventricle: The right ventricular size is normal. No increase in right ventricular wall thickness. Right ventricular systolic function is normal. There is normal pulmonary artery systolic pressure. The tricuspid regurgitant velocity is 1.51 m/s, and  with an assumed right atrial pressure of 15 mmHg, the estimated right ventricular systolic pressure is 24.1 mmHg. Left Atrium: Left atrial size was mildly  dilated. Right Atrium: Right atrial size was mildly dilated. Pericardium: There is no evidence of pericardial effusion. Mitral Valve: The mitral valve is grossly normal. Trivial mitral valve regurgitation. No evidence of mitral valve stenosis. Tricuspid Valve: The tricuspid valve is normal in structure. Tricuspid valve regurgitation is trivial. No evidence of tricuspid stenosis. Aortic Valve: The aortic valve is grossly normal. Aortic valve regurgitation is not visualized. No aortic stenosis is present. Pulmonic Valve: The pulmonic valve was normal in structure. Pulmonic valve regurgitation is not visualized. No evidence of pulmonic stenosis. Aorta: The aortic root and ascending aorta are structurally normal, with no evidence of dilitation. Venous: The inferior vena cava is dilated in size with less than 50% respiratory variability, suggesting right atrial pressure of 15 mmHg. IAS/Shunts: No atrial level shunt detected by color flow Doppler.  LEFT VENTRICLE PLAX 2D LVIDd:         3.90 cm      Diastology LVIDs:         2.60 cm      LV e' medial:    9.25 cm/s LV PW:         1.20 cm      LV E/e' medial:  8.9 LV IVS:        1.27 cm      LV e' lateral:   11.00 cm/s LVOT diam:     2.60 cm  LV E/e' lateral: 7.5 LV SV:         155 LV SV Index:   70 LVOT Area:     5.31 cm  LV Volumes (MOD) LV vol d, MOD A2C: 106.0 ml LV vol d, MOD A4C: 104.0 ml LV vol s, MOD A2C: 35.9 ml LV vol s, MOD A4C: 38.1 ml LV SV MOD A2C:     70.1 ml LV SV MOD A4C:     104.0 ml LV SV MOD BP:      72.0 ml RIGHT VENTRICLE            IVC RV S prime:     9.82 cm/s  IVC diam: 2.20 cm TAPSE (M-mode): 2.4 cm LEFT ATRIUM             Index        RIGHT ATRIUM           Index LA diam:        3.60 cm 1.63 cm/m   RA Area:     13.90 cm LA Vol (A2C):   36.9 ml 16.66 ml/m  RA Volume:   33.90 ml  15.31 ml/m LA Vol (A4C):   33.6 ml 15.17 ml/m LA Biplane Vol: 35.6 ml 16.08 ml/m  AORTIC VALVE LVOT Vmax:   131.00 cm/s LVOT Vmean:  98.000 cm/s LVOT VTI:     0.292 m  AORTA Ao Root diam: 3.30 cm Ao Asc diam:  3.60 cm MITRAL VALVE                TRICUSPID VALVE MV Area (PHT): 3.60 cm     TR Peak grad:   9.1 mmHg MV Decel Time: 211 msec     TR Vmax:        151.00 cm/s MV E velocity: 82.00 cm/s MV Nataly Pacifico velocity: 108.00 cm/s  SHUNTS MV E/Seynabou Fults ratio:  0.76         Systemic VTI:  0.29 m                             Systemic Diam: 2.60 cm Chilton Si MD Electronically signed by Chilton Si MD Signature Date/Time: 11/02/2023/3:37:41 PM    Final         Scheduled Meds:  atorvastatin  40 mg Oral q1800   Chlorhexidine Gluconate Cloth  6 each Topical Daily   enoxaparin (LOVENOX) injection  40 mg Subcutaneous Q24H   fenofibrate  160 mg Oral Daily   folic acid  1 mg Oral Daily   pantoprazole  40 mg Oral Daily   sulfamethoxazole-trimethoprim  1 tablet Oral Q12H   Continuous Infusions:     LOS: 1 day    Time spent: over 30 min    Lacretia Nicks, MD [facility]   To contact the attending provider between 7A-7P or the covering provider during after hours 7P-7A, please log into the web site [email or web address] and access using universal [place] password for that web site. If you do not have the password, please call the hospital operator.  11/03/2023, 2:28 PM

## 2023-11-03 NOTE — Discharge Summary (Signed)
Physician Discharge Summary  Todd Valdez ZOX:[id number] DOB: 1960/12/05 DOA: 11/01/2023  PCP: Monica Becton, MD  Admit date: 11/01/2023 Discharge date: 11/03/2023  Time spent: 40 minutes  Recommendations for Outpatient Follow-up:  Follow outpatient CBC/CMP  Follow with urology outpatient for definitive stone management Follow umbilical and inguinal hernias outpatient  Discharge Diagnoses:  Principal Problem:   Left ureteral stone Active Problems:   Rheumatoid arthritis (HCC)   Hypertension   Hyperlipidemia, predominantly triglycerides   Lumbar spinal stenosis   Sepsis secondary to UTI [facility])   Coronary atherosclerosis   Aortic atherosclerosis (HCC)   Prostate enlargement   Hyperglycemia   Umbilical hernia without obstruction or gangrene   Bilateral inguinal hernia without obstruction or gangrene   Abnormal electrocardiogram (ECG) (EKG)   Sepsis (HCC)   Discharge Condition: stable  Diet recommendation: heart healthy  Filed Weights   11/01/23 0239  Weight: 97.1 kg    History of present illness:   Todd Valdez is Todd Valdez 63 y.o. male with medical history not limited to rheumatoid arthritis, melanoma, diverticulitis, GERD, gastric outlet obstruction, hypertriglyceridemia, hypertension who presented to the emergency department sepsis due to Todd Valdez UTI with an obstructing stone.  Now s/p stent placement by urology.    Discharging with 2 weeks abx.  Will need urology follow up for definitive stone management.   Hospital Course:  Assessment and Plan:  Sepsis secondary to UTI Left Obstructing Ureteral Stone with Hydronephrosis CT at presentation with 6x5x4 mm L UPJ obstructing stone with mild hydro and obstructive uropathy Now s/p cystoscopy with L retrograde pyelogram and L ureteral stent placement Leukocytosis resolved, fever this morning, but clinically much improved Blood cultures currently NG Urine culture growing klebsiella aerogenes -> sensitive to bactrim  Urology  recommending definitive stone management as outpatient, 2 weeks culture directed abx   Lactic Acidosis Due to sepsis - currently hemodynamically stable, mild NAGMA -> will defer further follow up unless clinically indicated   Oxygen Requirement resolved   Rheumatoid Arthritis  Chronic Immunosuppression  Takes methotrexate and orencia weekly - recommended he ask rheum if ok that he hold x1 week after discharge Continue prednisone   Abnormal EKG Follow echo -> EF 65-70%, no RWMA   HLD Takes repatha, statin, fibrate   HTN Will hold antihypertensives for now with presentation with sepsis, BP ok today Holding lisinopril/hydrochlorothiazide   BPH Noted, follow with urology   Umbilical and Bilateral Inguinal Fat Hernias Noted, follow outpatient    Procedures: Procedure: Cystoscopy with left retrograde pyelogram and left ureteral stent placement   Echo IMPRESSIONS     1. Left ventricular ejection fraction, by estimation, is 65 to 70%. The  left ventricle has normal function. The left ventricle has no regional  wall motion abnormalities. The left ventricular internal cavity size was  mildly dilated. There is mild  concentric left ventricular hypertrophy. Left ventricular diastolic  parameters are consistent with Grade I diastolic dysfunction (impaired  relaxation).   2. Right ventricular systolic function is normal. The right ventricular  size is normal. There is normal pulmonary artery systolic pressure.   3. Left atrial size was mildly dilated.   4. Right atrial size was mildly dilated.   5. The mitral valve is grossly normal. Trivial mitral valve  regurgitation. No evidence of mitral stenosis.   6. The aortic valve is grossly normal. Aortic valve regurgitation is not  visualized. No aortic stenosis is present.   7. The inferior vena cava is dilated in size with <50% respiratory  variability,  suggesting right atrial pressure of 15 mmHg.   Comparison(s): No prior  Echocardiogram.    Consultations: urology  Discharge Exam: Vitals:   11/03/23 0609 11/03/23 1200  BP:    Pulse:    Resp:    Temp: 99.2 F (37.3 C) 97.8 F (36.6 C)  SpO2: 94% 97%   See progress note, eager to discharge   Discharge Instructions   Discharge Instructions     Call MD for:  difficulty breathing, headache or visual disturbances   Complete by: As directed    Call MD for:  extreme fatigue   Complete by: As directed    Call MD for:  hives   Complete by: As directed    Call MD for:  persistant dizziness or light-headedness   Complete by: As directed    Call MD for:  persistant nausea and vomiting   Complete by: As directed    Call MD for:  redness, tenderness, or signs of infection (pain, swelling, redness, odor or green/yellow discharge around incision site)   Complete by: As directed    Call MD for:  severe uncontrolled pain   Complete by: As directed    Call MD for:  temperature >100.4   Complete by: As directed    Diet - low sodium heart healthy   Complete by: As directed    Discharge instructions   Complete by: As directed    You were seen for Todd Valdez urinary tract infection with an obstructing stone.  You've had Todd Valdez stent placed by urology and have significantly improved.  You're growing klebsiella aerogenes in your urine which is sensitive to Todd Valdez medication called bactrim.  We'll continue you on this to complete Todd Valdez 2 week course.    You had Todd Valdez fever 1/25 AM, but you're improving.  If you have new or recurrent fevers at home, you should return to the hospital (or your doctor) for additional evaluation.  Call your rheumatologist Monday before you resume your injectable medicines for your rheumatoid arthritis.  Ask if you can hold these medicines in the setting of your infection for 1 week.    Return for new, recurrent, or worsening symptoms.  Please ask your PCP to request records from this hospitalization so they know what was done and what the next steps will  be.   Increase activity slowly   Complete by: As directed       Allergies as of 11/03/2023   No Known Allergies      Medication List     STOP taking these medications    azithromycin 250 MG tablet Commonly known as: Zithromax Z-Pak   HYDROcodone-acetaminophen 5-325 MG tablet Commonly known as: NORCO/VICODIN   naproxen sodium 220 MG tablet Commonly known as: ALEVE       TAKE these medications    atorvastatin 40 MG tablet Commonly known as: LIPITOR Take 1 tablet (40 mg total) by mouth daily at 6 PM.   B-D TB SYRINGE 1CC/27GX1/2" 27G X 1/2" 1 ML Misc Generic drug: TUBERCULIN SYR 1CC/27GX1/2" use to inject methotrexate once Todd Valdez week   B-D TB SYRINGE 1CC/27GX1/2" 27G X 1/2" 1 ML Misc Generic drug: TUBERCULIN SYR 1CC/27GX1/2" Use once Todd Valdez week for subcutaneous MTX injection   doxycycline 50 MG tablet Commonly known as: ADOXA Take 1 tablet (50 mg total) by mouth daily.   esomeprazole 40 MG capsule Commonly known as: NEXIUM Take 1 capsule (40 mg total) by mouth daily at 12 noon.   fenofibrate 160 MG tablet Take 1  tablet (160 mg total) by mouth daily.   fluticasone 50 MCG/ACT nasal spray Commonly known as: FLONASE Place 1 spray into both nostrils 2 (two) times daily. What changed:  when to take this reasons to take this   folic acid 1 MG tablet Commonly known as: FOLVITE Take 1 tablet (1 mg total) by mouth daily.   lisinopril-hydrochlorothiazide 20-25 MG tablet Commonly known as: ZESTORETIC Take 1 tablet by mouth daily. **NEED APPOINTMENT FOR FURTHER REFILLS**   methotrexate 50 MG/2ML injection Inject 1 mL (25 mg total) once Todd Valdez week.   methotrexate 50 MG/2ML injection Inject 1 mL (25 mg total) into the muscle once Todd Valdez week.   niacin 500 MG tablet Commonly known as: (VITAMIN B3) Take 1 tablet (500 mg total) by mouth at bedtime.   omega-3 acid ethyl esters 1 g capsule Commonly known as: LOVAZA Take 2 capsules (2 grams total) by mouth 2 (two) times daily.    Orencia ClickJect 125 MG/ML Soaj Generic drug: Abatacept Inject 1 ml subcutaneously once Todd Valdez week.   predniSONE 1 MG tablet Commonly known as: DELTASONE Take 1 tablet (1 mg total) by mouth daily with breakfast. Start taking on: November 04, 2023 What changed:  medication strength how much to take when to take this   promethazine-dextromethorphan 6.25-15 MG/5ML syrup Commonly known as: PROMETHAZINE-DM Take 5 mLs by mouth 2 (two) times daily as needed for cough.   Repatha SureClick 140 MG/ML Soaj Generic drug: Evolocumab Inject 140 mg into the skin every 14 (fourteen) days.   sulfamethoxazole-trimethoprim 800-160 MG tablet Commonly known as: BACTRIM DS Take 1 tablet by mouth every 12 (twelve) hours for 13 days.       No Known Allergies  Follow-up Information     Jerilee Field, MD. Call.   Specialty: Urology Why: Call for an appointment for definitive stone management Contact information: [address] [place] [address] [phone]                  The results of significant diagnostics from this hospitalization (including imaging, microbiology, ancillary and laboratory) are listed below for reference.    Significant Diagnostic Studies: ECHOCARDIOGRAM COMPLETE Result Date: 11/02/2023    ECHOCARDIOGRAM REPORT   Patient Name:   Todd Valdez Date of Exam: 11/02/2023 Medical Rec #:  [id number]  Height:       73.0 in Accession #:    [id number] Weight:       214.0 lb Date of Birth:  12-22-1960  BSA:          2.214 m Patient Age:    62 years   BP:           131/81 mmHg Patient Gender: M          HR:           93 bpm. Exam Location:  Inpatient Procedure: 2D Echo, Cardiac Doppler and Color Doppler Indications:    I25.110 Atherosclerotic heart disease of native coronary artery                 with unstable angina pectoris; R94.31 Abnormal EKG  History:        Patient has no prior history of Echocardiogram examinations.                 Abnormal ECG; Risk  Factors:Dyslipidemia and Hypertension.  Sonographer:    Sheralyn Boatman RDCS Referring Phys: [phone] MATTHEW ESKRIDGE  Sonographer Comments: Technically difficult study due to poor echo windows. Image acquisition challenging due  to patient body habitus. IMPRESSIONS  1. Left ventricular ejection fraction, by estimation, is 65 to 70%. The left ventricle has normal function. The left ventricle has no regional wall motion abnormalities. The left ventricular internal cavity size was mildly dilated. There is mild concentric left ventricular hypertrophy. Left ventricular diastolic parameters are consistent with Grade I diastolic dysfunction (impaired relaxation).  2. Right ventricular systolic function is normal. The right ventricular size is normal. There is normal pulmonary artery systolic pressure.  3. Left atrial size was mildly dilated.  4. Right atrial size was mildly dilated.  5. The mitral valve is grossly normal. Trivial mitral valve regurgitation. No evidence of mitral stenosis.  6. The aortic valve is grossly normal. Aortic valve regurgitation is not visualized. No aortic stenosis is present.  7. The inferior vena cava is dilated in size with <50% respiratory variability, suggesting right atrial pressure of 15 mmHg. Comparison(s): No prior Echocardiogram. FINDINGS  Left Ventricle: Left ventricular ejection fraction, by estimation, is 65 to 70%. The left ventricle has normal function. The left ventricle has no regional wall motion abnormalities. The left ventricular internal cavity size was mildly dilated. There is  mild concentric left ventricular hypertrophy. Left ventricular diastolic parameters are consistent with Grade I diastolic dysfunction (impaired relaxation). Normal left ventricular filling pressure. Right Ventricle: The right ventricular size is normal. No increase in right ventricular wall thickness. Right ventricular systolic function is normal. There is normal pulmonary artery systolic pressure. The  tricuspid regurgitant velocity is 1.51 m/s, and  with an assumed right atrial pressure of 15 mmHg, the estimated right ventricular systolic pressure is 24.1 mmHg. Left Atrium: Left atrial size was mildly dilated. Right Atrium: Right atrial size was mildly dilated. Pericardium: There is no evidence of pericardial effusion. Mitral Valve: The mitral valve is grossly normal. Trivial mitral valve regurgitation. No evidence of mitral valve stenosis. Tricuspid Valve: The tricuspid valve is normal in structure. Tricuspid valve regurgitation is trivial. No evidence of tricuspid stenosis. Aortic Valve: The aortic valve is grossly normal. Aortic valve regurgitation is not visualized. No aortic stenosis is present. Pulmonic Valve: The pulmonic valve was normal in structure. Pulmonic valve regurgitation is not visualized. No evidence of pulmonic stenosis. Aorta: The aortic root and ascending aorta are structurally normal, with no evidence of dilitation. Venous: The inferior vena cava is dilated in size with less than 50% respiratory variability, suggesting right atrial pressure of 15 mmHg. IAS/Shunts: No atrial level shunt detected by color flow Doppler.  LEFT VENTRICLE PLAX 2D LVIDd:         3.90 cm      Diastology LVIDs:         2.60 cm      LV e' medial:    9.25 cm/s LV PW:         1.20 cm      LV E/e' medial:  8.9 LV IVS:        1.27 cm      LV e' lateral:   11.00 cm/s LVOT diam:     2.60 cm      LV E/e' lateral: 7.5 LV SV:         155 LV SV Index:   70 LVOT Area:     5.31 cm  LV Volumes (MOD) LV vol d, MOD A2C: 106.0 ml LV vol d, MOD A4C: 104.0 ml LV vol s, MOD A2C: 35.9 ml LV vol s, MOD A4C: 38.1 ml LV SV MOD A2C:     70.1  ml LV SV MOD A4C:     104.0 ml LV SV MOD BP:      72.0 ml RIGHT VENTRICLE            IVC RV S prime:     9.82 cm/s  IVC diam: 2.20 cm TAPSE (M-mode): 2.4 cm LEFT ATRIUM             Index        RIGHT ATRIUM           Index LA diam:        3.60 cm 1.63 cm/m   RA Area:     13.90 cm LA Vol (A2C):   36.9  ml 16.66 ml/m  RA Volume:   33.90 ml  15.31 ml/m LA Vol (A4C):   33.6 ml 15.17 ml/m LA Biplane Vol: 35.6 ml 16.08 ml/m  AORTIC VALVE LVOT Vmax:   131.00 cm/s LVOT Vmean:  98.000 cm/s LVOT VTI:    0.292 m  AORTA Ao Root diam: 3.30 cm Ao Asc diam:  3.60 cm MITRAL VALVE                TRICUSPID VALVE MV Area (PHT): 3.60 cm     TR Peak grad:   9.1 mmHg MV Decel Time: 211 msec     TR Vmax:        151.00 cm/s MV E velocity: 82.00 cm/s MV Manika Hast velocity: 108.00 cm/s  SHUNTS MV E/Militza Devery ratio:  0.76         Systemic VTI:  0.29 m                             Systemic Diam: 2.60 cm Chilton Si MD Electronically signed by Chilton Si MD Signature Date/Time: 11/02/2023/3:37:41 PM    Final    DG C-Arm 1-60 Min-No Report Result Date: 11/01/2023 Fluoroscopy was utilized by the requesting physician.  No radiographic interpretation.   DG Chest Port 1 View Result Date: 11/01/2023 CLINICAL DATA:  Hypertension and sleep apnea.  Possible sepsis. EXAM: PORTABLE CHEST 1 VIEW COMPARISON:  10/22/2023 FINDINGS: Low volume film. The lungs are clear without focal pneumonia, edema, pneumothorax or pleural effusion. Cardiopericardial silhouette is at upper limits of normal for size. No acute bony abnormality. Telemetry leads overlie the chest. IMPRESSION: Low volume film without acute cardiopulmonary findings. Electronically Signed   By: Kennith Center M.D.   On: 11/01/2023 06:39   CT ABDOMEN PELVIS W CONTRAST Result Date: 11/01/2023 CLINICAL DATA:  Left flank pain, nausea and vomiting. EXAM: CT ABDOMEN AND PELVIS WITH CONTRAST TECHNIQUE: Multidetector CT imaging of the abdomen and pelvis was performed using the standard protocol following bolus administration of intravenous contrast. RADIATION DOSE REDUCTION: This exam was performed according to the departmental dose-optimization program which includes automated exposure control, adjustment of the mA and/or kV according to patient size and/or use of iterative reconstruction  technique. CONTRAST:  OMNIPAQUE IOHEXOL 300 MG/ML  SOLN COMPARISON:  None Available. FINDINGS: Lower chest: Moderate-sized hiatal hernia with about 1/3 of the stomach intrathoracic. The cardiac size is normal. There is trace calcification in the right coronary artery. There is subpleural reticulation in the right-greater-than-left lower lobes. No acute lung base infiltrates. Elevated right hemidiaphragm. Hepatobiliary: The liver is 19 cm length with mild steatosis. There is no mass enhancement. The gallbladder and bile ducts are unremarkable. Pancreas: No abnormality. Spleen: No abnormality. No splenomegaly. Small splenule posteriorly. Adrenals/Urinary Tract: 6 x 5  x 4 mm left UPJ obstructing stone, with mild hydronephrosis. There is mild left perinephric fluid which could be due to Eldonna Neuenfeldt caliceal leak or due to obstructive uropathy. Asymmetric cortical contrast retention noted in the left kidney and lack of contrast excretion on the delayed images consistent with obstructive uropathy. There is Chalet Kerwin 5 mm nonobstructive caliceal stone in the inferior pole of the kidney. No right nephrolithiasis is seen prior There is no adrenal mass. On the right there is Cashis Rill Bosniak 1 cyst in the superior pole measuring 1 cm, 18 Hounsfield units, and Shealynn Saulnier 7 mm hypodense too small to characterize Bosniak 2 cyst in the inferior pole. On the left, there is Rasmus Preusser 2 cm Bosniak 1 cyst in the upper pole, Hounsfield density 13.2, and Elvia Aydin 1.2 cm Bosniak 1 cyst in the anterior hilar cortex with Hounsfield density of 15. There is no solid mass enhancement in either kidney. No follow-up imaging is recommended. There is no bladder thickening. Stomach/Bowel: No dilatation or wall thickening including the appendix. There is sigmoid diverticulosis without evidence of diverticulitis. Vascular/Lymphatic: Aortic atherosclerosis. No enlarged abdominal or pelvic lymph nodes. Reproductive: Mild prostatomegaly. Other: Small umbilical and bilateral inguinal fat  hernias. No incarcerated hernia. No free pelvic fluid. No free hemorrhage or free air. Musculoskeletal: Slight thoracic dextroscoliosis. Multilevel degenerative disc space loss lower thoracic spine, with degenerative disc disease and spondylosis at L4-5 and L5-S1 No acute or other significant osseous findings. No focal pathologic process. IMPRESSION: 1. 6 x 5 x 4 mm left UPJ obstructing stone with mild hydronephrosis and obstructive uropathy. Correlate clinically for infectious complication. 2. Mild left perinephric fluid which could be due to Renell Coaxum caliceal leak or due to the obstructive uropathy. 3. 5 mm nonobstructive caliceal stone in the inferior pole of the left kidney. Bilateral Bosniak 1 cysts. 4. Moderate-sized hiatal hernia. 5. Aortic and coronary artery atherosclerosis. 6. Diverticulosis without evidence of diverticulitis. 7. Mild prostatomegaly. 8. Small umbilical and bilateral inguinal fat hernias. Aortic Atherosclerosis (ICD10-I70.0). Electronically Signed   By: Almira Bar M.D.   On: 11/01/2023 05:10   DG Chest 2 View Result Date: 10/23/2023 CLINICAL DATA:  63 year old male with persistent chronic cough EXAM: CHEST - 2 VIEW COMPARISON:  10/19/2022, 10/06/2022, 12/21/2014, 12/31/2012 FINDINGS: Cardiomediastinal silhouette unchanged in size and contour. Similar appearance of low lung volumes with linear opacities at the lung bases, and no confluent airspace disease, pneumothorax, or pleural effusion. Mild bronchial wall thickening, increased from the comparison study of 12/21/2014 and 12/31/2012 No displaced fracture.  Degenerative changes of the spine. IMPRESSION: Low lung volumes, with some increased bronchial wall thickening when compared to more remote chest x-rays, potentially representing bronchitis. Negative for lobar pneumonia. Electronically Signed   By: Gilmer Mor D.O.   On: 10/23/2023 14:39    Microbiology: Recent Results (from the past 240 hours)  Urine Culture     Status: Abnormal    Collection Time: 11/01/23  5:10 AM   Specimen: Urine, Clean Catch  Result Value Ref Range Status   Specimen Description   Final    URINE, CLEAN CATCH Performed at [facility], [address] [place], [place] [address]    Special Requests   Final    NONE Performed at [facility], [address] [place], [place] [address]    Culture >=100,000 COLONIES/mL KLEBSIELLA AEROGENES (Lenox Bink)  Final   Report Status 11/03/2023 FINAL  Final   Organism ID, Bacteria KLEBSIELLA AEROGENES (Malaak Stach)  Final  Susceptibility   Klebsiella aerogenes - MIC*    CEFEPIME <=0.12 SENSITIVE Sensitive     CEFTRIAXONE <=0.25 SENSITIVE Sensitive     CIPROFLOXACIN <=0.25 SENSITIVE Sensitive     GENTAMICIN <=1 SENSITIVE Sensitive     IMIPENEM 1 SENSITIVE Sensitive     NITROFURANTOIN 64 INTERMEDIATE Intermediate     TRIMETH/SULFA <=20 SENSITIVE Sensitive     PIP/TAZO 32 INTERMEDIATE Intermediate ug/mL    * >=100,000 COLONIES/mL KLEBSIELLA AEROGENES  Blood Culture (routine x 2)     Status: None (Preliminary result)   Collection Time: 11/01/23  6:43 AM   Specimen: BLOOD LEFT WRIST  Result Value Ref Range Status   Specimen Description   Final    BLOOD LEFT WRIST Performed at [facility], [address] [facility] [address] [place], [place] [address]    Special Requests   Final    BOTTLES DRAWN AEROBIC AND ANAEROBIC Blood Culture results may not be optimal due to an inadequate volume of blood received in culture bottles Performed at [facility], [address] [place], [place] [address]    Culture   Final    NO GROWTH 2 DAYS Performed at [facility], [address] [place], [place] [address]    Report Status PENDING  Incomplete  Blood Culture (routine x 2)     Status: None (Preliminary result)   Collection Time: 11/01/23  6:43 AM   Specimen: BLOOD LEFT ARM  Result Value Ref Range Status   Specimen Description   Final    BLOOD LEFT ARM Performed at [facility],  [address] [place], [place] [address]    Special Requests   Final    BOTTLES DRAWN AEROBIC AND ANAEROBIC Blood Culture results may not be optimal due to an inadequate volume of blood received in culture bottles Performed at [facility], [address] [place], [place] [address]    Culture   Final    NO GROWTH 2 DAYS Performed at [facility], [address] [place], [place] [address]    Report Status PENDING  Incomplete  MRSA Next Gen by PCR, Nasal     Status: None   Collection Time: 11/01/23  9:02 AM   Specimen: Nasal Mucosa; Nasal Swab  Result Value Ref Range Status   MRSA by PCR Next Gen NOT DETECTED NOT DETECTED Final    Comment: (NOTE) The GeneXpert MRSA Assay (FDA approved for NASAL specimens only), is one component of Demetris Capell comprehensive MRSA colonization surveillance program. It is not intended to diagnose MRSA infection nor to guide or monitor treatment for MRSA infections. Test performance is not FDA approved in patients less than 3 years old. Performed at [facility], [address], [place], [place] [address]      Labs: Basic Metabolic Panel: Recent Labs  Lab 11/01/23 0247 11/02/23 0252 11/03/23 1439  NA 139 139 138  K 4.0 4.1 3.6  CL 107 110 108  CO2 22 19* 21*  GLUCOSE 127* 129* 119*  BUN 27* 22 18  CREATININE 1.24 0.94 1.15  CALCIUM 9.6 8.5* 9.0   Liver Function Tests: Recent Labs  Lab 11/01/23 0247 11/02/23 0252 11/03/23 1439  AST 21 20 40  ALT 22 23 39  ALKPHOS 74 53 73  BILITOT 0.9 1.1 1.1  PROT 6.9 5.5* 5.7*  ALBUMIN 4.1 3.0* 2.7*   No results for input(s): "LIPASE", "AMYLASE" in the last 168 hours. No  results for input(s): "AMMONIA" in the last 168 hours. CBC: Recent Labs  Lab 11/01/23 0247 11/02/23 0252 11/03/23 1439  WBC 22.1* 24.0* 8.3  NEUTROABS 17.0* 22.4*  --   HGB 14.8 13.1 13.5  HCT 43.4 40.2 41.8  MCV 87.7 93.3 92.9  PLT 289 171 139*   Cardiac Enzymes: No results for  input(s): "CKTOTAL", "CKMB", "CKMBINDEX", "TROPONINI" in the last 168 hours. BNP: BNP (last 3 results) No results for input(s): "BNP" in the last 8760 hours.  ProBNP (last 3 results) No results for input(s): "PROBNP" in the last 8760 hours.  CBG: No results for input(s): "GLUCAP" in the last 168 hours.     Signed:  Lacretia Nicks MD.  [facility] 11/03/2023, 4:20 PM

## 2023-11-03 NOTE — Plan of Care (Signed)
Problem: Clinical Measurements: Goal: Will remain free from infection Outcome: Progressing   Problem: Clinical Measurements: Goal: Diagnostic test results will improve Outcome: Progressing   Problem: Activity: Goal: Risk for activity intolerance will decrease Outcome: Progressing   Problem: Nutrition: Goal: Adequate nutrition will be maintained Outcome: Progressing

## 2023-11-06 LAB — CULTURE, BLOOD (ROUTINE X 2)
Culture: NO GROWTH
Culture: NO GROWTH

## 2023-11-07 ENCOUNTER — Other Ambulatory Visit (HOSPITAL_BASED_OUTPATIENT_CLINIC_OR_DEPARTMENT_OTHER): Payer: Self-pay

## 2023-11-07 MED ORDER — SULFAMETHOXAZOLE-TRIMETHOPRIM 800-160 MG PO TABS
1.0000 | ORAL_TABLET | Freq: Every day | ORAL | 0 refills | Status: DC
  Filled 2023-11-07: qty 14, 14d supply, fill #0

## 2023-11-13 ENCOUNTER — Other Ambulatory Visit: Payer: Self-pay

## 2023-11-16 ENCOUNTER — Other Ambulatory Visit (HOSPITAL_BASED_OUTPATIENT_CLINIC_OR_DEPARTMENT_OTHER): Payer: Self-pay

## 2023-11-16 DIAGNOSIS — N202 Calculus of kidney with calculus of ureter: Secondary | ICD-10-CM | POA: Diagnosis not present

## 2023-11-19 ENCOUNTER — Ambulatory Visit: Payer: Medicare Other | Admitting: Sports Medicine

## 2023-11-20 DIAGNOSIS — N200 Calculus of kidney: Secondary | ICD-10-CM | POA: Diagnosis not present

## 2023-12-04 ENCOUNTER — Other Ambulatory Visit (HOSPITAL_BASED_OUTPATIENT_CLINIC_OR_DEPARTMENT_OTHER): Payer: Self-pay

## 2023-12-04 ENCOUNTER — Ambulatory Visit (INDEPENDENT_AMBULATORY_CARE_PROVIDER_SITE_OTHER): Payer: Commercial Managed Care - PPO | Admitting: Sports Medicine

## 2023-12-04 VITALS — BP 107/75 | HR 92 | Temp 98.5°F | Ht 73.0 in | Wt 206.4 lb

## 2023-12-04 DIAGNOSIS — R972 Elevated prostate specific antigen [PSA]: Secondary | ICD-10-CM

## 2023-12-04 DIAGNOSIS — N201 Calculus of ureter: Secondary | ICD-10-CM

## 2023-12-04 MED ORDER — ONDANSETRON 8 MG PO TBDP
8.0000 mg | ORAL_TABLET | Freq: Three times a day (TID) | ORAL | 3 refills | Status: AC | PRN
  Filled 2023-12-04: qty 20, 7d supply, fill #0
  Filled 2024-01-03: qty 20, 7d supply, fill #1
  Filled 2024-07-13: qty 20, 7d supply, fill #2

## 2023-12-04 NOTE — Progress Notes (Addendum)
    Procedures performed today:    None.  Independent interpretation of notes and tests performed by another provider:   None.  Brief History, Exam, Impression, and Recommendations:    Left ureteral stone This is a very pleasant 63 year old male, he is here for hospital follow-up, he was at home, developed severe pain, seen in the ED where CT ultimately showed ureteral lithiasis with hydronephrosis, a stent was placed, ultimately he had a ureteroscopy. He was discharged in stable condition. Mild nausea, adding Zofran. He does have follow-up with urology. He did have some renal insufficiency, we will check all of his labs. I will also go and check his PSA which was elevated slightly during his physical.  Update: PSA has normalized, there is still some renal insufficiency, the ratio is fairly high so this suggests some degree of dehydration, hydrate aggressively and we should recheck this in about a month.  Elevated PSA Normalized on recheck    ____________________________________________ Ihor Austin. Benjamin Stain, M.D., ABFM., CAQSM., AME. Primary Care and Sports Medicine [facility]  Adjunct Professor of Family Medicine  [employer] [employer]  Restaurant manager, fast food

## 2023-12-04 NOTE — Assessment & Plan Note (Addendum)
 This is a very pleasant 63 year old male, he is here for hospital follow-up, he was at home, developed severe pain, seen in the ED where CT ultimately showed ureteral lithiasis with hydronephrosis, a stent was placed, ultimately he had a ureteroscopy. He was discharged in stable condition. Mild nausea, adding Zofran . He does have follow-up with urology. He did have some renal insufficiency, we will check all of his labs. I will also go and check his PSA which was elevated slightly during his physical.  Update: PSA has normalized, there is still some renal insufficiency, the ratio is fairly high so this suggests some degree of dehydration, hydrate aggressively and we should recheck this in about a month.

## 2023-12-05 LAB — COMPREHENSIVE METABOLIC PANEL
ALT: 16 IU/L (ref 0–44)
AST: 19 IU/L (ref 0–40)
Albumin: 4.4 g/dL (ref 3.9–4.9)
Alkaline Phosphatase: 95 IU/L (ref 44–121)
BUN/Creatinine Ratio: 20 (ref 10–24)
BUN: 28 mg/dL — ABNORMAL HIGH (ref 8–27)
Bilirubin Total: 0.8 mg/dL (ref 0.0–1.2)
CO2: 19 mmol/L — ABNORMAL LOW (ref 20–29)
Calcium: 10.5 mg/dL — ABNORMAL HIGH (ref 8.6–10.2)
Chloride: 104 mmol/L (ref 96–106)
Creatinine, Ser: 1.41 mg/dL — ABNORMAL HIGH (ref 0.76–1.27)
Globulin, Total: 2.4 g/dL (ref 1.5–4.5)
Glucose: 98 mg/dL (ref 70–99)
Potassium: 4.8 mmol/L (ref 3.5–5.2)
Sodium: 140 mmol/L (ref 134–144)
Total Protein: 6.8 g/dL (ref 6.0–8.5)
eGFR: 56 mL/min/{1.73_m2} — ABNORMAL LOW (ref >59)

## 2023-12-05 LAB — CBC WITH DIFFERENTIAL/PLATELET
Basophils Absolute: 0.1 10*3/uL (ref 0.0–0.2)
Basos: 1 %
EOS (ABSOLUTE): 0.5 10*3/uL — ABNORMAL HIGH (ref 0.0–0.4)
Eos: 5 %
Hematocrit: 42.4 % (ref 37.5–51.0)
Hemoglobin: 14.4 g/dL (ref 13.0–17.7)
Immature Grans (Abs): 0.1 10*3/uL (ref 0.0–0.1)
Immature Granulocytes: 1 %
Lymphocytes Absolute: 2.9 10*3/uL (ref 0.7–3.1)
Lymphs: 27 %
MCH: 30.4 pg (ref 26.6–33.0)
MCHC: 34 g/dL (ref 31.5–35.7)
MCV: 90 fL (ref 79–97)
Monocytes Absolute: 0.9 10*3/uL (ref 0.1–0.9)
Monocytes: 8 %
Neutrophils Absolute: 6.2 10*3/uL (ref 1.4–7.0)
Neutrophils: 58 %
Platelets: 299 10*3/uL (ref 150–450)
RBC: 4.73 x10E6/uL (ref 4.14–5.80)
RDW: 13.8 % (ref 11.6–15.4)
WBC: 10.6 10*3/uL (ref 3.4–10.8)

## 2023-12-05 LAB — PSA, TOTAL AND FREE
PSA, Free Pct: 32.2 %
PSA, Free: 0.58 ng/mL
Prostate Specific Ag, Serum: 1.8 ng/mL (ref 0.0–4.0)

## 2023-12-05 NOTE — Addendum Note (Signed)
 Addended by: Monica Becton on: 12/05/2023 12:25 PM   Modules accepted: Orders

## 2023-12-05 NOTE — Assessment & Plan Note (Signed)
Normalized on recheck 

## 2023-12-06 ENCOUNTER — Other Ambulatory Visit (HOSPITAL_COMMUNITY): Payer: Self-pay

## 2023-12-10 ENCOUNTER — Other Ambulatory Visit: Payer: Self-pay

## 2023-12-10 ENCOUNTER — Other Ambulatory Visit (HOSPITAL_BASED_OUTPATIENT_CLINIC_OR_DEPARTMENT_OTHER): Payer: Self-pay

## 2023-12-10 DIAGNOSIS — E663 Overweight: Secondary | ICD-10-CM | POA: Diagnosis not present

## 2023-12-10 DIAGNOSIS — R5382 Chronic fatigue, unspecified: Secondary | ICD-10-CM | POA: Diagnosis not present

## 2023-12-10 DIAGNOSIS — M1991 Primary osteoarthritis, unspecified site: Secondary | ICD-10-CM | POA: Diagnosis not present

## 2023-12-10 DIAGNOSIS — H16002 Unspecified corneal ulcer, left eye: Secondary | ICD-10-CM | POA: Diagnosis not present

## 2023-12-10 DIAGNOSIS — Z6828 Body mass index (BMI) 28.0-28.9, adult: Secondary | ICD-10-CM | POA: Diagnosis not present

## 2023-12-10 DIAGNOSIS — M0589 Other rheumatoid arthritis with rheumatoid factor of multiple sites: Secondary | ICD-10-CM | POA: Diagnosis not present

## 2023-12-10 DIAGNOSIS — M79671 Pain in right foot: Secondary | ICD-10-CM | POA: Diagnosis not present

## 2023-12-10 MED ORDER — METHOTREXATE SODIUM CHEMO INJECTION 250 MG/10ML
25.0000 mg | INTRAMUSCULAR | 1 refills | Status: DC
  Filled 2023-12-10: qty 10, 70d supply, fill #0
  Filled 2024-02-12: qty 10, 70d supply, fill #1
  Filled 2024-04-22 – 2024-10-23 (×3): qty 10, 70d supply, fill #2

## 2023-12-10 MED ORDER — TUBERCULIN SYRINGE 27G X 1/2" 1 ML MISC
1.0000 | 3 refills | Status: DC
  Filled 2023-12-10: qty 13, 60d supply, fill #0
  Filled 2024-02-04: qty 13, 60d supply, fill #1
  Filled 2024-04-02: qty 13, 60d supply, fill #2
  Filled 2024-06-02: qty 13, 60d supply, fill #3

## 2023-12-11 ENCOUNTER — Other Ambulatory Visit (HOSPITAL_BASED_OUTPATIENT_CLINIC_OR_DEPARTMENT_OTHER): Payer: Self-pay

## 2023-12-14 ENCOUNTER — Other Ambulatory Visit (HOSPITAL_BASED_OUTPATIENT_CLINIC_OR_DEPARTMENT_OTHER): Payer: Self-pay

## 2023-12-17 ENCOUNTER — Other Ambulatory Visit: Payer: Self-pay

## 2023-12-17 ENCOUNTER — Other Ambulatory Visit (HOSPITAL_COMMUNITY): Payer: Self-pay

## 2023-12-17 ENCOUNTER — Other Ambulatory Visit: Payer: Self-pay | Admitting: Pharmacist

## 2023-12-17 MED ORDER — ORENCIA CLICKJECT 125 MG/ML ~~LOC~~ SOAJ: SUBCUTANEOUS | 1 refills | Status: DC

## 2023-12-17 MED ORDER — ORENCIA CLICKJECT 125 MG/ML ~~LOC~~ SOAJ
SUBCUTANEOUS | 1 refills | Status: DC
  Filled 2023-12-17: qty 4, 28d supply, fill #0
  Filled 2024-01-03: qty 4, 28d supply, fill #1

## 2023-12-17 NOTE — Progress Notes (Signed)
 Specialty Pharmacy Refill Coordination Note  Todd Valdez is a 63 y.o. male contacted today regarding refills of specialty medication(s) Abatacept (Orencia ClickJect)   Patient requested Delivery   Delivery date: 12/19/23   Verified address: [address]   [place] [address]   Medication will be filled on 03.11.25.     This fill date is pending response to refill request from provider. Patient is aware and if they have not received fill by intended date they must follow up with pharmacy.

## 2024-01-03 ENCOUNTER — Other Ambulatory Visit (HOSPITAL_BASED_OUTPATIENT_CLINIC_OR_DEPARTMENT_OTHER): Payer: Self-pay

## 2024-01-03 ENCOUNTER — Other Ambulatory Visit (HOSPITAL_COMMUNITY): Payer: Self-pay

## 2024-01-03 ENCOUNTER — Other Ambulatory Visit: Payer: Self-pay

## 2024-01-03 MED ORDER — ORENCIA CLICKJECT 125 MG/ML ~~LOC~~ SOAJ
SUBCUTANEOUS | 1 refills | Status: DC
  Filled 2024-01-03: qty 4, 28d supply, fill #0
  Filled 2024-02-14: qty 4, 28d supply, fill #1

## 2024-01-03 NOTE — Progress Notes (Signed)
 Specialty Pharmacy Refill Coordination Note  Todd Valdez is a 63 y.o. male contacted today regarding refills of specialty medication(s) Abatacept (Orencia ClickJect)   Patient requested Delivery   Delivery date: 01/09/24   Verified address: [address]   [place] [address]   Medication will be filled on 01/08/24.

## 2024-01-04 ENCOUNTER — Other Ambulatory Visit (HOSPITAL_BASED_OUTPATIENT_CLINIC_OR_DEPARTMENT_OTHER): Payer: Self-pay

## 2024-01-08 ENCOUNTER — Other Ambulatory Visit: Payer: Self-pay

## 2024-01-08 DIAGNOSIS — N200 Calculus of kidney: Secondary | ICD-10-CM | POA: Diagnosis not present

## 2024-01-09 ENCOUNTER — Other Ambulatory Visit: Payer: Self-pay

## 2024-01-14 ENCOUNTER — Other Ambulatory Visit (HOSPITAL_BASED_OUTPATIENT_CLINIC_OR_DEPARTMENT_OTHER): Payer: Self-pay

## 2024-01-14 MED ORDER — PREDNISONE 5 MG PO TABS
5.0000 mg | ORAL_TABLET | Freq: Every day | ORAL | 1 refills | Status: DC
  Filled 2024-01-14: qty 90, 90d supply, fill #0
  Filled 2024-04-08: qty 90, 90d supply, fill #1

## 2024-01-16 ENCOUNTER — Other Ambulatory Visit (HOSPITAL_BASED_OUTPATIENT_CLINIC_OR_DEPARTMENT_OTHER): Payer: Self-pay

## 2024-01-28 ENCOUNTER — Other Ambulatory Visit (HOSPITAL_BASED_OUTPATIENT_CLINIC_OR_DEPARTMENT_OTHER): Payer: Self-pay

## 2024-01-29 ENCOUNTER — Other Ambulatory Visit (HOSPITAL_BASED_OUTPATIENT_CLINIC_OR_DEPARTMENT_OTHER): Payer: Self-pay

## 2024-02-01 ENCOUNTER — Other Ambulatory Visit (HOSPITAL_BASED_OUTPATIENT_CLINIC_OR_DEPARTMENT_OTHER): Payer: Self-pay

## 2024-02-01 ENCOUNTER — Encounter (HOSPITAL_BASED_OUTPATIENT_CLINIC_OR_DEPARTMENT_OTHER): Payer: Self-pay

## 2024-02-04 ENCOUNTER — Other Ambulatory Visit (HOSPITAL_BASED_OUTPATIENT_CLINIC_OR_DEPARTMENT_OTHER): Payer: Self-pay

## 2024-02-04 DIAGNOSIS — H40013 Open angle with borderline findings, low risk, bilateral: Secondary | ICD-10-CM | POA: Diagnosis not present

## 2024-02-04 DIAGNOSIS — H16403 Unspecified corneal neovascularization, bilateral: Secondary | ICD-10-CM | POA: Diagnosis not present

## 2024-02-04 DIAGNOSIS — H179 Unspecified corneal scar and opacity: Secondary | ICD-10-CM | POA: Diagnosis not present

## 2024-02-04 DIAGNOSIS — H18463 Peripheral corneal degeneration, bilateral: Secondary | ICD-10-CM | POA: Diagnosis not present

## 2024-02-04 DIAGNOSIS — M069 Rheumatoid arthritis, unspecified: Secondary | ICD-10-CM | POA: Diagnosis not present

## 2024-02-05 ENCOUNTER — Other Ambulatory Visit (HOSPITAL_COMMUNITY): Payer: Self-pay

## 2024-02-05 ENCOUNTER — Other Ambulatory Visit: Payer: Self-pay | Admitting: Sports Medicine

## 2024-02-05 ENCOUNTER — Other Ambulatory Visit (HOSPITAL_BASED_OUTPATIENT_CLINIC_OR_DEPARTMENT_OTHER): Payer: Self-pay

## 2024-02-05 DIAGNOSIS — E782 Mixed hyperlipidemia: Secondary | ICD-10-CM

## 2024-02-05 MED ORDER — FENOFIBRATE 160 MG PO TABS
160.0000 mg | ORAL_TABLET | Freq: Every day | ORAL | 2 refills | Status: DC
  Filled 2024-02-05: qty 90, 90d supply, fill #0
  Filled 2024-05-05: qty 90, 90d supply, fill #1

## 2024-02-07 ENCOUNTER — Other Ambulatory Visit (HOSPITAL_BASED_OUTPATIENT_CLINIC_OR_DEPARTMENT_OTHER): Payer: Self-pay

## 2024-02-07 ENCOUNTER — Other Ambulatory Visit: Payer: Self-pay | Admitting: Sports Medicine

## 2024-02-07 MED ORDER — LISINOPRIL-HYDROCHLOROTHIAZIDE 20-25 MG PO TABS
1.0000 | ORAL_TABLET | Freq: Every day | ORAL | 1 refills | Status: DC
  Filled 2024-02-09: qty 90, 90d supply, fill #0
  Filled 2024-05-12: qty 90, 90d supply, fill #1

## 2024-02-09 ENCOUNTER — Other Ambulatory Visit (HOSPITAL_BASED_OUTPATIENT_CLINIC_OR_DEPARTMENT_OTHER): Payer: Self-pay

## 2024-02-10 ENCOUNTER — Other Ambulatory Visit (HOSPITAL_BASED_OUTPATIENT_CLINIC_OR_DEPARTMENT_OTHER): Payer: Self-pay

## 2024-02-12 ENCOUNTER — Other Ambulatory Visit (HOSPITAL_BASED_OUTPATIENT_CLINIC_OR_DEPARTMENT_OTHER): Payer: Self-pay

## 2024-02-13 ENCOUNTER — Other Ambulatory Visit (HOSPITAL_BASED_OUTPATIENT_CLINIC_OR_DEPARTMENT_OTHER): Payer: Self-pay

## 2024-02-13 ENCOUNTER — Ambulatory Visit: Payer: Self-pay

## 2024-02-13 NOTE — Telephone Encounter (Signed)
 FYI - Patient has been scheduled with the provider on 02/15/24.

## 2024-02-13 NOTE — Telephone Encounter (Signed)
  Chief Complaint: Cough Symptoms: productive cough with yellow mucus for 2-3 months, wheezing, congestion Frequency: 2-3 months Pertinent Negatives: Patient denies fever, CP, SOB Disposition: [] ED /[] Urgent Care (no appt availability in office) / [] Appointment(In office/virtual)/ []  [facility]/ [] Home Care/ [] Refused Recommended Disposition /[] [facility]/ [x]  Follow-up with PCP Additional Notes: patient called to make an appointment to follow up with PCP. Patient endorses cough that is sometimes productive with yellow mucus. Patient states cough has been going on for 2-3 months. Patient denies fever, CP or SOB. Patient was transferred to the office to make an appointment with PCP. Patient verbalized understanding and all questions answered prior to transfer.    Copied from CRM [id number]. Topic: Clinical - Red Word Triage >> Feb 13, 2024  9:17 AM Retta Caster wrote: Red Word that prompted transfer to Nurse Triage: Cough 2-3 mnths/Wheezing/Congestions/Yellow tan mucus/Worse last 1-2 days Reason for Disposition  [1] Continuous (nonstop) coughing interferes with work or school AND [2] no improvement using cough treatment per Care Advice  Answer Assessment - Initial Assessment Questions 1. ONSET: "When did the cough begin?"      2-3 months ago 2. SEVERITY: "How bad is the cough today?"      Per patient "this morning cough isn't that bad but I was up late into the night coughing." 3. SPUTUM: "Describe the color of your sputum" (none, dry cough; clear, white, yellow, green)     Yellow tan color 4. HEMOPTYSIS: "Are you coughing up any blood?" If so ask: "How much?" (flecks, streaks, tablespoons, etc.)     no 5. DIFFICULTY BREATHING: "Are you having difficulty breathing?" If Yes, ask: "How bad is it?" (e.g., mild, moderate, severe)    - MILD: No SOB at rest, mild SOB with walking, speaks normally in sentences, can lie down, no retractions, pulse < 100.    - MODERATE: SOB at rest,  SOB with minimal exertion and prefers to sit, cannot lie down flat, speaks in phrases, mild retractions, audible wheezing, pulse 100-120.    - SEVERE: Very SOB at rest, speaks in single words, struggling to breathe, sitting hunched forward, retractions, pulse > 120      With coughing spells 6. FEVER: "Do you have a fever?" If Yes, ask: "What is your temperature, how was it measured, and when did it start?"     no 7. CARDIAC HISTORY: "Do you have any history of heart disease?" (e.g., heart attack, congestive heart failure)      no 8. LUNG HISTORY: "Do you have any history of lung disease?"  (e.g., pulmonary embolus, asthma, emphysema)     no 9. PE RISK FACTORS: "Do you have a history of blood clots?" (or: recent major surgery, recent prolonged travel, bedridden)     no 10. OTHER SYMPTOMS: "Do you have any other symptoms?" (e.g., runny nose, wheezing, chest pain)       Congestion, some wheezing with coughing spells 12. TRAVEL: "Have you traveled out of the country in the last month?" (e.g., travel history, exposures)       no  Protocols used: Cough - Acute Productive-A-AH

## 2024-02-14 ENCOUNTER — Other Ambulatory Visit (HOSPITAL_COMMUNITY): Payer: Self-pay

## 2024-02-14 ENCOUNTER — Other Ambulatory Visit: Payer: Self-pay

## 2024-02-14 ENCOUNTER — Other Ambulatory Visit: Payer: Self-pay | Admitting: Pharmacy Technician

## 2024-02-14 NOTE — Progress Notes (Signed)
 Specialty Pharmacy Ongoing Clinical Assessment Note  Todd Valdez is a 63 y.o. male who is being followed by the specialty pharmacy service for RxSp Rheumatoid Arthritis   Patient's specialty medication(s) reviewed today: Abatacept  (Orencia  ClickJect)   Missed doses in the last 4 weeks: 0   Patient/Caregiver did not have any additional questions or concerns.   Therapeutic benefit summary: Patient is achieving benefit   Adverse events/side effects summary: No adverse events/side effects   Patient's therapy is appropriate to: Continue    Goals Addressed             This Visit's Progress    Reduce signs and symptoms   On track    Patient is on track. Patient will maintain adherence.  Patient report he is well-controlled at this time.          Follow up: 6 months  Malachi Screws Specialty Pharmacist

## 2024-02-14 NOTE — Progress Notes (Signed)
 Specialty Pharmacy Refill Coordination Note  Todd Valdez is a 63 y.o. male contacted today regarding refills of specialty medication(s) Abatacept  (Orencia  ClickJect)   Patient requested Delivery   Delivery date: 02/27/24   Verified address: [address]  [place] [address]   Medication will be filled on 02/26/24.

## 2024-02-15 ENCOUNTER — Ambulatory Visit (INDEPENDENT_AMBULATORY_CARE_PROVIDER_SITE_OTHER): Admitting: Sports Medicine

## 2024-02-15 ENCOUNTER — Other Ambulatory Visit (HOSPITAL_BASED_OUTPATIENT_CLINIC_OR_DEPARTMENT_OTHER): Payer: Self-pay

## 2024-02-15 VITALS — BP 108/71 | HR 84 | Wt 211.0 lb

## 2024-02-15 DIAGNOSIS — R053 Chronic cough: Secondary | ICD-10-CM | POA: Diagnosis not present

## 2024-02-15 DIAGNOSIS — J439 Emphysema, unspecified: Secondary | ICD-10-CM

## 2024-02-15 MED ORDER — TRELEGY ELLIPTA 200-62.5-25 MCG/ACT IN AEPB: INHALATION_SPRAY | RESPIRATORY_TRACT | Status: DC

## 2024-02-15 MED ORDER — AZITHROMYCIN 250 MG PO TABS
ORAL_TABLET | ORAL | 0 refills | Status: DC
  Filled 2024-02-15: qty 6, 5d supply, fill #0

## 2024-02-15 MED ORDER — CEFDINIR 300 MG PO CAPS
300.0000 mg | ORAL_CAPSULE | Freq: Two times a day (BID) | ORAL | 0 refills | Status: DC
  Filled 2024-02-15: qty 14, 7d supply, fill #0

## 2024-02-15 MED ORDER — TRELEGY ELLIPTA 200-62.5-25 MCG/ACT IN AEPB: 1.0000 | INHALATION_SPRAY | Freq: Every day | RESPIRATORY_TRACT | Status: DC

## 2024-02-15 MED ORDER — PREDNISONE 50 MG PO TABS
50.0000 mg | ORAL_TABLET | Freq: Every day | ORAL | 0 refills | Status: DC
Start: 2024-02-15 — End: 2024-05-07
  Filled 2024-02-15: qty 5, 5d supply, fill #0

## 2024-02-15 NOTE — Assessment & Plan Note (Addendum)
 Todd Valdez returns, he is a very pleasant 63 year old male with history of rheumatoid arthritis, he has had pneumonia couple times. He is on daily prednisone . We last treated him in January with azithromycin , a burst of prednisone , Flonase . He did have some slight improvement but then worsening of symptoms. Main complaint is persistent cough On exam he is speaking full sentences, appears well, no nasal flaring, oropharyngeal exam is normal with the exception of minimal nasal mucosal bogginess. He does have some expiratory wheezes in the lower lung fields bilaterally. Considering now over 4 months of cough, shortness of breath, wheeze, as well as a recent hospitalization for nephrolithiasis we are going to treat him as if this is a healthcare associated process. Adding 5 days of prednisone  again, Trelegy, chest CT, and antipseudomonal beta-lactam cefdinir  as well as azithromycin  again for atypicals.  Update: CT did show emphysema with bronchiectasis, refilling Trelegy and I would like pulmonology consultation as well.

## 2024-02-15 NOTE — Progress Notes (Addendum)
    Procedures performed today:    None.  Independent interpretation of notes and tests performed by another provider:   None.  Brief History, Exam, Impression, and Recommendations:    Emphysema with bronchiectasis Zadiel returns, he is a very pleasant 63 year old male with history of rheumatoid arthritis, he has had pneumonia couple times. He is on daily prednisone . We last treated him in January with azithromycin , a burst of prednisone , Flonase . He did have some slight improvement but then worsening of symptoms. Main complaint is persistent cough On exam he is speaking full sentences, appears well, no nasal flaring, oropharyngeal exam is normal with the exception of minimal nasal mucosal bogginess. He does have some expiratory wheezes in the lower lung fields bilaterally. Considering now over 4 months of cough, shortness of breath, wheeze, as well as a recent hospitalization for nephrolithiasis we are going to treat him as if this is a healthcare associated process. Adding 5 days of prednisone  again, Trelegy, chest CT, and antipseudomonal beta-lactam cefdinir  as well as azithromycin  again for atypicals.  Update: CT did show emphysema with bronchiectasis, refilling Trelegy and I would like pulmonology consultation as well.    ____________________________________________ Joselyn Nicely. Sandy Crumb, M.D., ABFM., CAQSM., AME. Primary Care and Sports Medicine [facility]  Adjunct Professor of [facility] Medicine  [employer]  Restaurant manager, fast food

## 2024-02-18 ENCOUNTER — Other Ambulatory Visit (HOSPITAL_COMMUNITY): Payer: Self-pay

## 2024-02-18 ENCOUNTER — Ambulatory Visit

## 2024-02-18 DIAGNOSIS — J432 Centrilobular emphysema: Secondary | ICD-10-CM | POA: Diagnosis not present

## 2024-02-18 DIAGNOSIS — R053 Chronic cough: Secondary | ICD-10-CM

## 2024-02-18 DIAGNOSIS — J479 Bronchiectasis, uncomplicated: Secondary | ICD-10-CM | POA: Diagnosis not present

## 2024-02-18 DIAGNOSIS — R918 Other nonspecific abnormal finding of lung field: Secondary | ICD-10-CM | POA: Diagnosis not present

## 2024-02-18 MED ORDER — IOHEXOL 300 MG/ML  SOLN
100.0000 mL | Freq: Once | INTRAMUSCULAR | Status: AC | PRN
  Administered 2024-02-18: 75 mL via INTRAVENOUS

## 2024-02-25 ENCOUNTER — Ambulatory Visit: Payer: Self-pay | Admitting: Sports Medicine

## 2024-02-25 ENCOUNTER — Other Ambulatory Visit (HOSPITAL_BASED_OUTPATIENT_CLINIC_OR_DEPARTMENT_OTHER): Payer: Self-pay

## 2024-02-25 ENCOUNTER — Other Ambulatory Visit: Payer: Self-pay

## 2024-02-25 MED ORDER — TRELEGY ELLIPTA 200-62.5-25 MCG/ACT IN AEPB
1.0000 | INHALATION_SPRAY | Freq: Every day | RESPIRATORY_TRACT | 3 refills | Status: DC
  Filled 2024-02-25: qty 60, 30d supply, fill #0
  Filled 2024-03-22: qty 60, 30d supply, fill #1
  Filled 2024-04-23: qty 60, 30d supply, fill #2
  Filled 2024-05-26: qty 60, 30d supply, fill #3

## 2024-02-25 NOTE — Addendum Note (Signed)
 Addended by: Sharleen Szczesny P on: 02/25/2024 09:47 AM   Modules accepted: Orders

## 2024-02-25 NOTE — Addendum Note (Signed)
 Addended by: Gean Keels on: 02/25/2024 11:30 AM   Modules accepted: Orders

## 2024-02-25 NOTE — Addendum Note (Signed)
 Addended by: OLIVA-AVELLANEDA, Raymund Manrique L on: 02/25/2024 10:08 AM   Modules accepted: Orders

## 2024-02-26 ENCOUNTER — Other Ambulatory Visit: Payer: Self-pay

## 2024-03-03 ENCOUNTER — Other Ambulatory Visit: Payer: Self-pay | Admitting: Sports Medicine

## 2024-03-03 DIAGNOSIS — E785 Hyperlipidemia, unspecified: Secondary | ICD-10-CM

## 2024-03-04 ENCOUNTER — Other Ambulatory Visit (HOSPITAL_BASED_OUTPATIENT_CLINIC_OR_DEPARTMENT_OTHER): Payer: Self-pay

## 2024-03-04 MED ORDER — ATORVASTATIN CALCIUM 40 MG PO TABS
40.0000 mg | ORAL_TABLET | Freq: Every day | ORAL | 3 refills | Status: DC
  Filled 2024-03-05: qty 90, 90d supply, fill #0
  Filled 2024-06-08: qty 90, 90d supply, fill #1

## 2024-03-05 ENCOUNTER — Other Ambulatory Visit (HOSPITAL_BASED_OUTPATIENT_CLINIC_OR_DEPARTMENT_OTHER): Payer: Self-pay

## 2024-03-12 ENCOUNTER — Other Ambulatory Visit (HOSPITAL_BASED_OUTPATIENT_CLINIC_OR_DEPARTMENT_OTHER): Payer: Self-pay

## 2024-03-12 ENCOUNTER — Other Ambulatory Visit: Payer: Self-pay | Admitting: Sports Medicine

## 2024-03-12 DIAGNOSIS — E782 Mixed hyperlipidemia: Secondary | ICD-10-CM

## 2024-03-12 MED ORDER — OMEGA-3-ACID ETHYL ESTERS 1 G PO CAPS
2.0000 | ORAL_CAPSULE | Freq: Two times a day (BID) | ORAL | 1 refills | Status: DC
  Filled 2024-03-12: qty 360, 90d supply, fill #0
  Filled 2024-06-10 – 2024-06-16 (×2): qty 360, 90d supply, fill #1

## 2024-03-17 DIAGNOSIS — M0589 Other rheumatoid arthritis with rheumatoid factor of multiple sites: Secondary | ICD-10-CM | POA: Diagnosis not present

## 2024-03-18 ENCOUNTER — Ambulatory Visit (INDEPENDENT_AMBULATORY_CARE_PROVIDER_SITE_OTHER): Admitting: Sports Medicine

## 2024-03-18 ENCOUNTER — Encounter: Payer: Self-pay | Admitting: Sports Medicine

## 2024-03-18 ENCOUNTER — Telehealth: Payer: Self-pay | Admitting: Sports Medicine

## 2024-03-18 DIAGNOSIS — E782 Mixed hyperlipidemia: Secondary | ICD-10-CM

## 2024-03-18 DIAGNOSIS — J439 Emphysema, unspecified: Secondary | ICD-10-CM

## 2024-03-18 NOTE — Progress Notes (Signed)
    Procedures performed today:    None.  Independent interpretation of notes and tests performed by another provider:   None.  Brief History, Exam, Impression, and Recommendations:    Emphysema with bronchiectasis This is a very pleasant 63 year old male with history of rheumatoid arthritis, he has multiple episodes of pneumonia, he is a former smoker, he smoked for decades, stopped in 1992 which is 33 years ago. He we were treating for a chronic cough. We treated him aggressively with an antipseudomonal beta-lactam cefdinir  as well as azithromycin . He got some prednisone , and a chest CT. We added Trelegy. The chest CT did confirm emphysema with bronchiectasis. Trelegy has worked really well, he feels very good, if he has exacerbations I would suggest using prednisone , as well as cefdinir  and azithromycin . Because he quit over 15 years ago he is not a candidate for annual lung cancer screening.  Hyperlipidemia, predominantly triglycerides Has hyperlipidemia, intolerant of statins. 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 It sounds like insurance company is not going to be covering his Repatha , we will turn this over to our pharmacy Rx prior Auth team to see if they can get it approved based on his history as well as use over the past several years. If unable to we will potentially have to use Praluent.    ____________________________________________ Joselyn Nicely. Sandy Crumb, M.D., ABFM., CAQSM., AME. Primary Care and Sports Medicine [facility]  Adjunct Professor of [facility] Medicine  [employer]  Restaurant manager, fast food

## 2024-03-18 NOTE — Assessment & Plan Note (Signed)
 Has hyperlipidemia, intolerant of statins. 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 It sounds like insurance company is not going to be covering his Repatha , we will turn this over to our pharmacy Rx prior Auth team to see if they can get it approved based on his history as well as use over the past several years. If unable to we will potentially have to use Praluent.

## 2024-03-18 NOTE — Telephone Encounter (Signed)
 Good morning Rx prior Auth team, this patient has been on Repatha  for some time now, he has an intolerance to statins and atherosclerotic cardiovascular disease, it sounds like his insurance company is only giving him a 30-day supply as Repatha  is no longer covered, please look into this, we can switch to Praluent if need be but I would rather him continue it has been working.

## 2024-03-18 NOTE — Assessment & Plan Note (Signed)
 This is a very pleasant 63 year old male with history of rheumatoid arthritis, he has multiple episodes of pneumonia, he is a former smoker, he smoked for decades, stopped in 1992 which is 33 years ago. He we were treating for a chronic cough. We treated him aggressively with an antipseudomonal beta-lactam cefdinir  as well as azithromycin . He got some prednisone , and a chest CT. We added Trelegy. The chest CT did confirm emphysema with bronchiectasis. Trelegy has worked really well, he feels very good, if he has exacerbations I would suggest using prednisone , as well as cefdinir  and azithromycin . Because he quit over 15 years ago he is not a candidate for annual lung cancer screening.

## 2024-03-19 ENCOUNTER — Telehealth: Payer: Self-pay

## 2024-03-19 ENCOUNTER — Other Ambulatory Visit: Payer: Self-pay

## 2024-03-19 ENCOUNTER — Other Ambulatory Visit (HOSPITAL_COMMUNITY): Payer: Self-pay

## 2024-03-19 DIAGNOSIS — H40013 Open angle with borderline findings, low risk, bilateral: Secondary | ICD-10-CM | POA: Diagnosis not present

## 2024-03-19 MED ORDER — ORENCIA CLICKJECT 125 MG/ML ~~LOC~~ SOAJ
SUBCUTANEOUS | 1 refills | Status: DC
  Filled 2024-03-19 – 2024-03-24 (×2): qty 4, 28d supply, fill #0
  Filled 2024-04-09: qty 4, 28d supply, fill #1

## 2024-03-19 NOTE — Telephone Encounter (Signed)
 PA request has been Submitted. New Encounter has been or will be created for follow up. For additional info see Pharmacy Prior Auth telephone encounter from 03/19/24.

## 2024-03-19 NOTE — Telephone Encounter (Signed)
 Copied from CRM [id number]. Topic: Clinical - Medication Prior Auth >> Mar 19, 2024  1:26 PM Tisa Forester wrote: Reason for CRM: hannah from  [employer] - medication prior authorization for the Repatha  injection  the medication is approved  as date of 6/11-25 until 03/19/2025 phone [phone] option 5

## 2024-03-19 NOTE — Telephone Encounter (Signed)
 Pharmacy Patient Advocate Encounter   Received notification from Pt Calls Messages that prior authorization for Repatha  SureClick 140MG /ML auto-injectors is required/requested.   Insurance verification completed.   The patient is insured through [facility] .   Per test claim: PA required; PA submitted to above mentioned insurance via CoverMyMeds Key/confirmation #/[id number] Status is pending

## 2024-03-19 NOTE — Telephone Encounter (Signed)
 Pharmacy Patient Advocate Encounter  Received notification from [facility] that Prior Authorization for Repatha  SureClick 140MG /ML auto-injectors has been APPROVED from 03/19/24 to 03/19/25. Unable to obtain price due to refill too soon rejection, last fill date 03/05/24 next available fill date6/18/25   PA #/Case ID/Reference #: [id number]

## 2024-03-24 ENCOUNTER — Encounter (INDEPENDENT_AMBULATORY_CARE_PROVIDER_SITE_OTHER): Payer: Self-pay

## 2024-03-24 ENCOUNTER — Other Ambulatory Visit: Payer: Self-pay

## 2024-03-24 ENCOUNTER — Other Ambulatory Visit (HOSPITAL_BASED_OUTPATIENT_CLINIC_OR_DEPARTMENT_OTHER): Payer: Self-pay

## 2024-03-24 NOTE — Progress Notes (Signed)
 Specialty Pharmacy Refill Coordination Note  Todd Valdez is a 63 y.o. male contacted today regarding refills of specialty medication(s) Abatacept  (Orencia  ClickJect)   Patient requested Delivery   Delivery date: 03/25/24   Verified address: [address] [name]   Medication will be filled on 03/24/24.

## 2024-03-25 ENCOUNTER — Other Ambulatory Visit: Payer: Self-pay

## 2024-03-29 ENCOUNTER — Other Ambulatory Visit (HOSPITAL_BASED_OUTPATIENT_CLINIC_OR_DEPARTMENT_OTHER): Payer: Self-pay

## 2024-03-31 ENCOUNTER — Other Ambulatory Visit (HOSPITAL_BASED_OUTPATIENT_CLINIC_OR_DEPARTMENT_OTHER): Payer: Self-pay

## 2024-03-31 MED ORDER — FOLIC ACID 1 MG PO TABS
1.0000 mg | ORAL_TABLET | Freq: Every day | ORAL | 2 refills | Status: AC
  Filled 2024-03-31: qty 90, 90d supply, fill #0
  Filled 2024-06-26: qty 90, 90d supply, fill #1
  Filled 2024-09-24 – 2024-09-30 (×3): qty 90, 90d supply, fill #2

## 2024-04-02 ENCOUNTER — Other Ambulatory Visit (HOSPITAL_BASED_OUTPATIENT_CLINIC_OR_DEPARTMENT_OTHER): Payer: Self-pay

## 2024-04-02 ENCOUNTER — Other Ambulatory Visit: Payer: Self-pay

## 2024-04-08 ENCOUNTER — Other Ambulatory Visit (HOSPITAL_BASED_OUTPATIENT_CLINIC_OR_DEPARTMENT_OTHER): Payer: Self-pay

## 2024-04-09 ENCOUNTER — Other Ambulatory Visit: Payer: Self-pay

## 2024-04-09 ENCOUNTER — Encounter (INDEPENDENT_AMBULATORY_CARE_PROVIDER_SITE_OTHER): Payer: Self-pay

## 2024-04-09 NOTE — Progress Notes (Signed)
 Specialty Pharmacy Refill Coordination Note  Todd Valdez is a 63 y.o. male contacted today regarding refills of specialty medication(s) Abatacept  (Orencia  ClickJect)   Patient requested (Patient-Rptd) Delivery   Delivery date: 04/16/24   Verified address: [address] [place] ,[place] [address]   Medication will be filled on 04/15/24.

## 2024-04-22 ENCOUNTER — Other Ambulatory Visit (HOSPITAL_BASED_OUTPATIENT_CLINIC_OR_DEPARTMENT_OTHER): Payer: Self-pay

## 2024-04-23 ENCOUNTER — Other Ambulatory Visit: Payer: Self-pay

## 2024-04-24 ENCOUNTER — Other Ambulatory Visit (HOSPITAL_BASED_OUTPATIENT_CLINIC_OR_DEPARTMENT_OTHER): Payer: Self-pay

## 2024-04-28 ENCOUNTER — Other Ambulatory Visit (HOSPITAL_BASED_OUTPATIENT_CLINIC_OR_DEPARTMENT_OTHER): Payer: Self-pay

## 2024-05-01 ENCOUNTER — Other Ambulatory Visit: Payer: Self-pay

## 2024-05-01 ENCOUNTER — Other Ambulatory Visit (HOSPITAL_BASED_OUTPATIENT_CLINIC_OR_DEPARTMENT_OTHER): Payer: Self-pay

## 2024-05-01 MED ORDER — DOXYCYCLINE MONOHYDRATE 50 MG PO TABS
50.0000 mg | ORAL_TABLET | Freq: Every day | ORAL | 2 refills | Status: DC
  Filled 2024-05-01: qty 60, 60d supply, fill #0

## 2024-05-02 ENCOUNTER — Other Ambulatory Visit (HOSPITAL_BASED_OUTPATIENT_CLINIC_OR_DEPARTMENT_OTHER): Payer: Self-pay

## 2024-05-02 MED ORDER — DOXYCYCLINE MONOHYDRATE 50 MG PO TABS
50.0000 mg | ORAL_TABLET | Freq: Every day | ORAL | 2 refills | Status: DC
  Filled 2024-05-02 – 2024-06-30 (×2): qty 60, 60d supply, fill #0

## 2024-05-05 ENCOUNTER — Other Ambulatory Visit (HOSPITAL_COMMUNITY): Payer: Self-pay

## 2024-05-05 ENCOUNTER — Other Ambulatory Visit: Payer: Self-pay

## 2024-05-05 MED ORDER — ORENCIA CLICKJECT 125 MG/ML ~~LOC~~ SOAJ
SUBCUTANEOUS | 1 refills | Status: DC
  Filled 2024-05-27: qty 4, 28d supply, fill #0
  Filled 2024-06-24: qty 4, 28d supply, fill #1

## 2024-05-07 ENCOUNTER — Other Ambulatory Visit (HOSPITAL_BASED_OUTPATIENT_CLINIC_OR_DEPARTMENT_OTHER): Payer: Self-pay

## 2024-05-07 ENCOUNTER — Other Ambulatory Visit: Payer: Self-pay

## 2024-05-07 ENCOUNTER — Encounter: Payer: Self-pay | Admitting: Pulmonary Disease

## 2024-05-07 ENCOUNTER — Ambulatory Visit: Admitting: Pulmonary Disease

## 2024-05-07 VITALS — BP 124/84 | HR 69 | Ht 72.0 in | Wt 210.0 lb

## 2024-05-07 DIAGNOSIS — E663 Overweight: Secondary | ICD-10-CM

## 2024-05-07 DIAGNOSIS — J479 Bronchiectasis, uncomplicated: Secondary | ICD-10-CM

## 2024-05-07 DIAGNOSIS — Z6828 Body mass index (BMI) 28.0-28.9, adult: Secondary | ICD-10-CM

## 2024-05-07 DIAGNOSIS — R0683 Snoring: Secondary | ICD-10-CM | POA: Diagnosis not present

## 2024-05-07 MED ORDER — SODIUM CHLORIDE 3 % IN NEBU
INHALATION_SOLUTION | Freq: Two times a day (BID) | RESPIRATORY_TRACT | 12 refills | Status: AC | PRN
  Filled 2024-05-07: qty 750, 30d supply, fill #0

## 2024-05-07 MED ORDER — ALBUTEROL SULFATE HFA 108 (90 BASE) MCG/ACT IN AERS
2.0000 | INHALATION_SPRAY | Freq: Four times a day (QID) | RESPIRATORY_TRACT | 6 refills | Status: AC | PRN
  Filled 2024-05-07: qty 6.7, 19d supply, fill #0
  Filled 2024-07-18: qty 6.7, 19d supply, fill #1
  Filled 2024-08-04: qty 6.7, 19d supply, fill #2

## 2024-05-07 NOTE — Patient Instructions (Addendum)
 Continue trelegy ellipta  1 puff daily - rinse mouth out after each use  Use albuterol  inhaler 1-2 puffs every 4-6 hours as needed  Airway Clearance Regimen When Sick or having chest congestion - use hypertonic saline nebulizer twice daily - followed by flutter valve, 5 - 10 good exhalations via device  We will order you a nebulizer machine and supplies  We will schedule you for pulmonary function tests at the front desk  Follow up in December 2025.

## 2024-05-07 NOTE — Progress Notes (Unsigned)
 Subjective:   PATIENT ID: Todd Valdez GENDER: male DOB: 30-Aug-1961, MRN: [id number]   HPI Discussed the use of AI scribe software for clinical note transcription with the patient, who gave verbal consent to proceed.  History of Present Illness   Todd Valdez is a 63 year old male with bronchiectasis who presents for a follow-up regarding his respiratory symptoms. He is accompanied by his wife, Todd Valdez. He was referred by Dr. ONEIDA for evaluation of his respiratory condition.  He experiences a persistent cough, particularly during the winter, with episodes of bronchitis. Last year, his condition led to fluid and congestion in the lungs, requiring multiple medication courses. Despite treatment, he continues to have a cough and occasional off-white mucus production. He uses a Trelegy inhaler, which has significantly improved his symptoms. He experiences shortness of breath in heat and humidity, requiring breaks during outdoor activities. He occasionally produces white mucus but no longer has a rattling sensation in his left lung.  He has a history of smoking, quitting in the late 1980s or early 1990s, and worked in Set [employer] with dust exposure. He previously required allergy  shots for dust allergies.  He has rheumatoid arthritis, managed with methotrexate , Orencia , and low-dose prednisone . 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       Past Medical History:  Diagnosis Date   Arthritis    Cancer (HCC)    Melanoma on  back   Diverticulitis    GERD (gastroesophageal reflux disease)    High triglycerides    History of abscess of skin and subcutaneous tissue    right forearm, chin   Hypertension    RA (rheumatoid arthritis) (HCC)    Reflux esophagitis    Right inguinal hernia    Sleep apnea    no cpap     Family History  Problem Relation Age of Onset   Hypertension Mother    Hypertension Father      Social History   Socioeconomic  History   Marital status: Married    Spouse name: Not on file   Number of children: Not on file   Years of education: Not on file   Highest education level: 12th grade  Occupational History   Not on file  Tobacco Use   Smoking status: Former    Current packs/day: 0.00    Types: Cigarettes    Quit date: 02/07/1991    Years since quitting: 33.2   Smokeless tobacco: Former    Quit date: 08/14/2009  Vaping Use   Vaping status: Never Used  Substance and Sexual Activity   Alcohol use: No   Drug use: No   Sexual activity: Yes    Partners: Female    Comment: married.   Other Topics Concern   Not on file  Social History Narrative   Not on file   Social Drivers of Health   Financial Resource Strain: Low Risk  (12/03/2023)   Overall Financial Resource Strain (CARDIA)    Difficulty of Paying Living Expenses: Not very hard  Food Insecurity: No Food Insecurity (12/03/2023)   Hunger Vital Sign    Worried About Running Out of Food in the Last Year: Never true    Ran Out of Food in the Last Year: Never true  Transportation Needs: No Transportation Needs (12/03/2023)   PRAPARE - Administrator, Civil Service (Medical): No    Lack of Transportation (Non-Medical): No  Physical Activity: Insufficiently Active (12/03/2023)   Exercise  Vital Sign    Days of Exercise per Week: 2 days    Minutes of Exercise per Session: 20 min  Stress: No Stress Concern Present (12/03/2023)   [employer] of Occupational Health - Occupational Stress Questionnaire    Feeling of Stress : Not at all  Social Connections: Moderately Integrated (12/03/2023)   Social Connection and Isolation Panel    Frequency of Communication with Friends and Family: Once a week    Frequency of Social Gatherings with Friends and Family: Once a week    Attends Religious Services: More than 4 times per year    Active Member of [employer] or Organizations: Yes    Attends Banker Meetings: 1 to 4 times per year     Marital Status: Married  Catering manager Violence: Not At Risk (11/01/2023)   Humiliation, Afraid, Rape, and Kick questionnaire    Fear of Current or Ex-Partner: No    Emotionally Abused: No    Physically Abused: No    Sexually Abused: No     No Known Allergies   Outpatient Medications Prior to Visit  Medication Sig Dispense Refill   Abatacept  (ORENCIA  CLICKJECT) 125 MG/ML SOAJ Inject 1 (125mg ) subcutaneously once a week. Subcutaneous 28 days 4 mL 1   atorvastatin  (LIPITOR) 40 MG tablet Take 1 tablet (40 mg total) by mouth daily at 6 PM. 90 tablet 3   doxycycline  (ADOXA) 50 MG tablet Take 1 tablet (50 mg total) by mouth daily. 60 tablet 2   esomeprazole  (NEXIUM ) 40 MG capsule Take 1 capsule (40 mg total) by mouth daily at 12 noon. 75 capsule 3   Evolocumab  (REPATHA  SURECLICK) 140 MG/ML SOAJ Inject 140 mg into the skin every 14 (fourteen) days. 6 mL 11   fenofibrate  160 MG tablet Take 1 tablet (160 mg total) by mouth daily. 90 tablet 2   Fluticasone -Umeclidin-Vilant (TRELEGY ELLIPTA ) 200-62.5-25 MCG/ACT AEPB Inhale 1 puff by mouth into the lungs daily. 60 each 3   folic acid  (FOLVITE ) 1 MG tablet Take 1 tablet (1 mg total) by mouth daily. 90 tablet 2   lisinopril -hydrochlorothiazide  (ZESTORETIC ) 20-25 MG tablet Take 1 tablet by mouth daily. 90 tablet 1   methotrexate  250 MG/10ML injection Inject 1 mL (25 mg total) into the muscle once a week. 120 mL 1   methotrexate  50 MG/2ML injection Inject 1 mL (25 mg total) once a week. 12 mL 1   niacin  (,VITAMIN B3,) 500 MG tablet Take 1 tablet (500 mg total) by mouth at bedtime. 100 tablet 0   omega-3 acid ethyl esters (LOVAZA ) 1 g capsule Take 2 capsules (2 grams total) by mouth 2 (two) times daily. 360 capsule 1   ondansetron  (ZOFRAN -ODT) 8 MG disintegrating tablet Take 1 tablet (8 mg total) by mouth every 8 (eight) hours as needed for nausea. 20 tablet 3   promethazine -dextromethorphan (PROMETHAZINE -DM) 6.25-15 MG/5ML syrup Take 5 mLs by mouth 2  (two) times daily as needed for cough. 118 mL 0   TUBERCULIN SYR 1CC/27GX1/2 (B-D TB SYRINGE 1CC/27GX1/2) 27G X 1/2 1 ML MISC use to inject methotrexate  once a week 4 each 11   TUBERCULIN SYR 1CC/27GX1/2 27G X 1/2 1 ML MISC Use as directed once a week for Methotrexate . 13 each 3   Tuberculin-Allergy  Syringes 27G X 1/2 1 ML MISC Use once a week for subcutaneous MTX injection 13 each 3   predniSONE  (DELTASONE ) 5 MG tablet Take 1 tablet (5 mg total) by mouth daily. 90 tablet 1   predniSONE  (  DELTASONE ) 1 MG tablet Take 1 tablet (1 mg total) by mouth daily with breakfast.     doxycycline  (ADOXA) 50 MG tablet Take 1 tablet (50 mg total) by mouth daily. 60 tablet 2   fluticasone  (FLONASE ) 50 MCG/ACT nasal spray Place 1 spray into both nostrils 2 (two) times daily. (Patient taking differently: Place 1 spray into both nostrils 2 (two) times daily as needed for allergies or rhinitis.) 48 g 3   methotrexate  50 MG/2ML injection Inject 1 mL (25 mg total) into the muscle once a week. 120 mL 1   predniSONE  (DELTASONE ) 50 MG tablet Take 1 tablet (50 mg total) by mouth daily. 5 tablet 0   sulfamethoxazole -trimethoprim  (BACTRIM  DS) 800-160 MG tablet Take 1 tablet by mouth at bedtime. (Start after current antibiotic cycle is finished) 14 tablet 0   No facility-administered medications prior to visit.    Review of Systems  Constitutional:  Negative for chills, fever, malaise/fatigue and weight loss.  HENT:  Negative for congestion, sinus pain and sore throat.   Eyes: Negative.   Respiratory:  Negative for cough, hemoptysis, sputum production, shortness of breath and wheezing.   Cardiovascular:  Negative for chest pain, palpitations, orthopnea, claudication and leg swelling.  Gastrointestinal:  Negative for abdominal pain, heartburn, nausea and vomiting.  Genitourinary: Negative.   Musculoskeletal:  Negative for joint pain and myalgias.  Skin:  Negative for rash.  Neurological:  Negative for weakness.   Endo/Heme/Allergies: Negative.   Psychiatric/Behavioral: Negative.     Objective:   Vitals:   05/07/24 0932  BP: 124/84  Pulse: 69  SpO2: 100%  Weight: 210 lb (95.3 kg)  Height: 6' (1.829 m)   Physical Exam Constitutional:      General: He is not in acute distress.    Appearance: Normal appearance.  Eyes:     General: No scleral icterus.    Conjunctiva/sclera: Conjunctivae normal.  Cardiovascular:     Rate and Rhythm: Normal rate and regular rhythm.  Pulmonary:     Breath sounds: No wheezing, rhonchi or rales.  Musculoskeletal:     Right lower leg: No edema.     Left lower leg: No edema.  Skin:    General: Skin is warm and dry.  Neurological:     General: No focal deficit present.    CBC    Component Value Date/Time   WBC 10.6 12/04/2023 0911   WBC 8.3 11/03/2023 1439   RBC 4.73 12/04/2023 0911   RBC 4.50 11/03/2023 1439   HGB 14.4 12/04/2023 0911   HCT 42.4 12/04/2023 0911   PLT 299 12/04/2023 0911   MCV 90 12/04/2023 0911   MCH 30.4 12/04/2023 0911   MCH 30.0 11/03/2023 1439   MCHC 34.0 12/04/2023 0911   MCHC 32.3 11/03/2023 1439   RDW 13.8 12/04/2023 0911   LYMPHSABS 2.9 12/04/2023 0911   MONOABS 0.9 11/02/2023 0252   EOSABS 0.5 (H) 12/04/2023 0911   BASOSABS 0.1 12/04/2023 0911      Latest Ref Rng & Units 12/04/2023    9:11 AM 11/03/2023    2:39 PM 11/02/2023    2:52 AM  BMP  Glucose 70 - 99 mg/dL 98  880  870   BUN 8 - 27 mg/dL 28  18  22    Creatinine 0.76 - 1.27 mg/dL 8.58  8.84  9.05   BUN/Creat Ratio 10 - 24 20     Sodium 134 - 144 mmol/L 140  138  139   Potassium 3.5 - 5.2  mmol/L 4.8  3.6  4.1   Chloride 96 - 106 mmol/L 104  108  110   CO2 20 - 29 mmol/L 19  21  19    Calcium  8.6 - 10.2 mg/dL 89.4  9.0  8.5    Chest imaging: CT Chest 02/18/24 1. Mild cylindrical bronchiectasis with mild circumferential bronchial wall thickening in the mid and lower lungs bilaterally. Imaging features could reflect a history of chronic bronchitis. 2.  Moderate hiatal hernia. 3. Aortic Atherosclerosis (ICD10-I70.0) and Emphysema (ICD10-J43.9).  PFT:     No data to display          Labs:  Path:  Echo:  Heart Catheterization:    Assessment & Plan:   Bronchiectasis without complication (HCC) - Plan: sodium chloride  HYPERTONIC 3 % nebulizer solution, albuterol  (VENTOLIN  HFA) 108 (90 Base) MCG/ACT inhaler, Ambulatory Referral for DME  Snoring - Plan: Home sleep test Assessment and Plan    Bronchiectasis likely associated with rheumatoid arthritis - Order nebulizer and hypertonic saline for airway clearance during flares. - Provide flutter valve for airway clearance. - Continue Trelegy inhaler. - Prescribe albuterol  rescue inhaler for acute shortness of breath. - Schedule pulmonary function tests.  Suspected obstructive sleep apnea Suspected sleep apnea due to snoring and fatigue. Discussed CPAP, dental appliances, and Inspire device. - Order home sleep study. - Consider overnight oxygen test if mild sleep apnea confirmed. - Discuss potential ENT referral for sinus evaluation.  Overweight BMI 28, overweight. Weight loss could improve sleep apnea. Recent weight loss from 215 to 210 pounds. - Encourage weight loss to achieve BMI closer to 25.   Follow up in December.  Dorn Chill, MD [facility] Office: [phone]   Current Outpatient Medications:    Abatacept  (ORENCIA  CLICKJECT) 125 MG/ML SOAJ, Inject 1 (125mg ) subcutaneously once a week. Subcutaneous 28 days, Disp: 4 mL, Rfl: 1   albuterol  (VENTOLIN  HFA) 108 (90 Base) MCG/ACT inhaler, Inhale 2 puffs into the lungs every 6 (six) hours as needed for wheezing or shortness of breath., Disp: 6.7 g, Rfl: 6   atorvastatin  (LIPITOR) 40 MG tablet, Take 1 tablet (40 mg total) by mouth daily at 6 PM., Disp: 90 tablet, Rfl: 3   doxycycline  (ADOXA) 50 MG tablet, Take 1 tablet (50 mg total) by mouth daily., Disp: 60 tablet, Rfl: 2   esomeprazole   (NEXIUM ) 40 MG capsule, Take 1 capsule (40 mg total) by mouth daily at 12 noon., Disp: 75 capsule, Rfl: 3   Evolocumab  (REPATHA  SURECLICK) 140 MG/ML SOAJ, Inject 140 mg into the skin every 14 (fourteen) days., Disp: 6 mL, Rfl: 11   fenofibrate  160 MG tablet, Take 1 tablet (160 mg total) by mouth daily., Disp: 90 tablet, Rfl: 2   Fluticasone -Umeclidin-Vilant (TRELEGY ELLIPTA ) 200-62.5-25 MCG/ACT AEPB, Inhale 1 puff by mouth into the lungs daily., Disp: 60 each, Rfl: 3   folic acid  (FOLVITE ) 1 MG tablet, Take 1 tablet (1 mg total) by mouth daily., Disp: 90 tablet, Rfl: 2   lisinopril -hydrochlorothiazide  (ZESTORETIC ) 20-25 MG tablet, Take 1 tablet by mouth daily., Disp: 90 tablet, Rfl: 1   methotrexate  250 MG/10ML injection, Inject 1 mL (25 mg total) into the muscle once a week., Disp: 120 mL, Rfl: 1   methotrexate  50 MG/2ML injection, Inject 1 mL (25 mg total) once a week., Disp: 12 mL, Rfl: 1   niacin  (,VITAMIN B3,) 500 MG tablet, Take 1 tablet (500 mg total) by mouth at bedtime., Disp: 100 tablet, Rfl: 0   omega-3 acid  ethyl esters (LOVAZA ) 1 g capsule, Take 2 capsules (2 grams total) by mouth 2 (two) times daily., Disp: 360 capsule, Rfl: 1   ondansetron  (ZOFRAN -ODT) 8 MG disintegrating tablet, Take 1 tablet (8 mg total) by mouth every 8 (eight) hours as needed for nausea., Disp: 20 tablet, Rfl: 3   promethazine -dextromethorphan (PROMETHAZINE -DM) 6.25-15 MG/5ML syrup, Take 5 mLs by mouth 2 (two) times daily as needed for cough., Disp: 118 mL, Rfl: 0   sodium chloride  HYPERTONIC 3 % nebulizer solution, Take by nebulization 2 (two) times daily as needed., Disp: 750 mL, Rfl: 12   TUBERCULIN SYR 1CC/27GX1/2 (B-D TB SYRINGE 1CC/27GX1/2) 27G X 1/2 1 ML MISC, use to inject methotrexate  once a week, Disp: 4 each, Rfl: 11   TUBERCULIN SYR 1CC/27GX1/2 27G X 1/2 1 ML MISC, Use as directed once a week for Methotrexate ., Disp: 13 each, Rfl: 3   Tuberculin-Allergy  Syringes 27G X 1/2 1 ML MISC, Use once a week  for subcutaneous MTX injection, Disp: 13 each, Rfl: 3   predniSONE  (DELTASONE ) 1 MG tablet, Take 1 tablet (1 mg total) by mouth daily with breakfast., Disp: , Rfl:

## 2024-05-08 ENCOUNTER — Telehealth: Payer: Self-pay | Admitting: Pulmonary Disease

## 2024-05-08 ENCOUNTER — Encounter: Payer: Self-pay | Admitting: Pulmonary Disease

## 2024-05-08 NOTE — Telephone Encounter (Signed)
 Per Glade at [facility]-  Good Morning, the order was denied due to the office notes not being signed, once they are signed I will pull them and we will resubmit the order. Please advise once order is signed

## 2024-05-09 NOTE — Telephone Encounter (Signed)
 Noted

## 2024-05-09 NOTE — Telephone Encounter (Signed)
 Sent  update to DME. NFN

## 2024-05-12 ENCOUNTER — Other Ambulatory Visit (HOSPITAL_BASED_OUTPATIENT_CLINIC_OR_DEPARTMENT_OTHER): Payer: Self-pay

## 2024-05-13 ENCOUNTER — Other Ambulatory Visit: Payer: Self-pay

## 2024-05-13 ENCOUNTER — Encounter (INDEPENDENT_AMBULATORY_CARE_PROVIDER_SITE_OTHER): Payer: Self-pay

## 2024-05-13 ENCOUNTER — Encounter

## 2024-05-13 DIAGNOSIS — R0683 Snoring: Secondary | ICD-10-CM

## 2024-05-13 DIAGNOSIS — G4730 Sleep apnea, unspecified: Secondary | ICD-10-CM | POA: Diagnosis not present

## 2024-05-14 ENCOUNTER — Other Ambulatory Visit (HOSPITAL_BASED_OUTPATIENT_CLINIC_OR_DEPARTMENT_OTHER): Payer: Self-pay

## 2024-05-15 DIAGNOSIS — J479 Bronchiectasis, uncomplicated: Secondary | ICD-10-CM | POA: Diagnosis not present

## 2024-05-19 ENCOUNTER — Other Ambulatory Visit: Payer: Self-pay

## 2024-05-23 DIAGNOSIS — G4733 Obstructive sleep apnea (adult) (pediatric): Secondary | ICD-10-CM | POA: Diagnosis not present

## 2024-05-26 ENCOUNTER — Other Ambulatory Visit (HOSPITAL_BASED_OUTPATIENT_CLINIC_OR_DEPARTMENT_OTHER): Payer: Self-pay

## 2024-05-27 ENCOUNTER — Other Ambulatory Visit: Payer: Self-pay

## 2024-05-27 NOTE — Progress Notes (Signed)
 Specialty Pharmacy Refill Coordination Note  Todd Valdez is a 63 y.o. male contacted today regarding refills of specialty medication(s) Abatacept  (Orencia  ClickJect)   Patient requested Delivery   Delivery date: 06/03/24   Verified address: [address]  [place], [place] [address]   Medication will be filled on 06/02/24.

## 2024-06-02 ENCOUNTER — Other Ambulatory Visit (HOSPITAL_BASED_OUTPATIENT_CLINIC_OR_DEPARTMENT_OTHER): Payer: Self-pay

## 2024-06-02 ENCOUNTER — Other Ambulatory Visit: Payer: Self-pay

## 2024-06-06 ENCOUNTER — Other Ambulatory Visit: Payer: Self-pay

## 2024-06-08 ENCOUNTER — Other Ambulatory Visit (HOSPITAL_BASED_OUTPATIENT_CLINIC_OR_DEPARTMENT_OTHER): Payer: Self-pay

## 2024-06-08 ENCOUNTER — Other Ambulatory Visit: Payer: Self-pay

## 2024-06-10 ENCOUNTER — Encounter: Payer: Self-pay | Admitting: Sports Medicine

## 2024-06-10 ENCOUNTER — Other Ambulatory Visit: Payer: Self-pay

## 2024-06-12 DIAGNOSIS — M0589 Other rheumatoid arthritis with rheumatoid factor of multiple sites: Secondary | ICD-10-CM | POA: Diagnosis not present

## 2024-06-12 DIAGNOSIS — M1991 Primary osteoarthritis, unspecified site: Secondary | ICD-10-CM | POA: Diagnosis not present

## 2024-06-12 DIAGNOSIS — H16002 Unspecified corneal ulcer, left eye: Secondary | ICD-10-CM | POA: Diagnosis not present

## 2024-06-12 DIAGNOSIS — M79671 Pain in right foot: Secondary | ICD-10-CM | POA: Diagnosis not present

## 2024-06-13 ENCOUNTER — Other Ambulatory Visit (HOSPITAL_BASED_OUTPATIENT_CLINIC_OR_DEPARTMENT_OTHER): Payer: Self-pay

## 2024-06-13 ENCOUNTER — Other Ambulatory Visit: Payer: Self-pay

## 2024-06-13 ENCOUNTER — Other Ambulatory Visit: Payer: Self-pay | Admitting: Urgent Care

## 2024-06-15 DIAGNOSIS — J479 Bronchiectasis, uncomplicated: Secondary | ICD-10-CM | POA: Diagnosis not present

## 2024-06-16 ENCOUNTER — Other Ambulatory Visit: Payer: Self-pay

## 2024-06-17 ENCOUNTER — Ambulatory Visit: Payer: Self-pay | Admitting: Pulmonary Disease

## 2024-06-17 NOTE — Telephone Encounter (Signed)
 Patient has severe sleep apnea with significant oxygen desaturations. I recommend he have CPAP titration study done at the sleep center. Please place order if he is ok with this plan.  Thanks, JD

## 2024-06-18 ENCOUNTER — Other Ambulatory Visit (HOSPITAL_BASED_OUTPATIENT_CLINIC_OR_DEPARTMENT_OTHER): Payer: Self-pay

## 2024-06-18 ENCOUNTER — Other Ambulatory Visit: Payer: Self-pay

## 2024-06-18 ENCOUNTER — Other Ambulatory Visit: Payer: Self-pay | Admitting: Urgent Care

## 2024-06-18 DIAGNOSIS — R0683 Snoring: Secondary | ICD-10-CM

## 2024-06-18 MED FILL — Esomeprazole Magnesium Cap Delayed Release 40 MG (Base Eq): ORAL | 90 days supply | Qty: 90 | Fill #0 | Status: AC

## 2024-06-18 NOTE — Telephone Encounter (Signed)
Order has been placed. NFN

## 2024-06-18 NOTE — Telephone Encounter (Signed)
 VM / LM x1

## 2024-06-19 ENCOUNTER — Other Ambulatory Visit: Payer: Self-pay

## 2024-06-23 ENCOUNTER — Other Ambulatory Visit (HOSPITAL_COMMUNITY): Payer: Self-pay

## 2024-06-23 ENCOUNTER — Encounter (INDEPENDENT_AMBULATORY_CARE_PROVIDER_SITE_OTHER): Payer: Self-pay

## 2024-06-24 ENCOUNTER — Other Ambulatory Visit: Payer: Self-pay | Admitting: Pharmacy Technician

## 2024-06-24 ENCOUNTER — Other Ambulatory Visit: Payer: Self-pay

## 2024-06-24 NOTE — Progress Notes (Signed)
 Specialty Pharmacy Refill Coordination Note  Todd Valdez is a 63 y.o. male contacted today regarding refills of specialty medication(s) Abatacept  (Orencia  ClickJect)   Patient requested (Patient-Rptd) Delivery   Delivery date: 06/27/24   Verified address: [address] [place] [address]   Medication will be filled on 06/26/24. Injection due on 07/01/24. Questionnaire answered.

## 2024-06-25 ENCOUNTER — Other Ambulatory Visit: Payer: Self-pay | Admitting: Family Medicine

## 2024-06-25 ENCOUNTER — Other Ambulatory Visit (HOSPITAL_BASED_OUTPATIENT_CLINIC_OR_DEPARTMENT_OTHER): Payer: Self-pay

## 2024-06-25 ENCOUNTER — Other Ambulatory Visit: Payer: Self-pay

## 2024-06-25 DIAGNOSIS — J439 Emphysema, unspecified: Secondary | ICD-10-CM

## 2024-06-25 DIAGNOSIS — R053 Chronic cough: Secondary | ICD-10-CM

## 2024-06-25 DIAGNOSIS — E782 Mixed hyperlipidemia: Secondary | ICD-10-CM

## 2024-06-25 MED ORDER — REPATHA SURECLICK 140 MG/ML ~~LOC~~ SOAJ
140.0000 mg | SUBCUTANEOUS | 11 refills | Status: DC
  Filled 2024-06-26: qty 6, 84d supply, fill #0
  Filled 2024-09-21: qty 6, 84d supply, fill #1

## 2024-06-25 MED ORDER — TRELEGY ELLIPTA 200-62.5-25 MCG/ACT IN AEPB
1.0000 | INHALATION_SPRAY | Freq: Every day | RESPIRATORY_TRACT | 3 refills | Status: DC
  Filled 2024-06-25: qty 60, 30d supply, fill #0
  Filled 2024-07-22 (×3): qty 60, 30d supply, fill #1

## 2024-06-26 ENCOUNTER — Other Ambulatory Visit (HOSPITAL_BASED_OUTPATIENT_CLINIC_OR_DEPARTMENT_OTHER): Payer: Self-pay

## 2024-06-30 ENCOUNTER — Other Ambulatory Visit (HOSPITAL_BASED_OUTPATIENT_CLINIC_OR_DEPARTMENT_OTHER): Payer: Self-pay

## 2024-07-14 ENCOUNTER — Other Ambulatory Visit (HOSPITAL_BASED_OUTPATIENT_CLINIC_OR_DEPARTMENT_OTHER): Payer: Self-pay

## 2024-07-14 MED ORDER — PREDNISONE 5 MG PO TABS
5.0000 mg | ORAL_TABLET | Freq: Every day | ORAL | 1 refills | Status: DC
  Filled 2024-07-14: qty 90, 90d supply, fill #0

## 2024-07-15 DIAGNOSIS — J479 Bronchiectasis, uncomplicated: Secondary | ICD-10-CM | POA: Diagnosis not present

## 2024-07-18 ENCOUNTER — Other Ambulatory Visit (HOSPITAL_BASED_OUTPATIENT_CLINIC_OR_DEPARTMENT_OTHER): Payer: Self-pay

## 2024-07-21 ENCOUNTER — Other Ambulatory Visit (HOSPITAL_BASED_OUTPATIENT_CLINIC_OR_DEPARTMENT_OTHER): Payer: Self-pay

## 2024-07-21 ENCOUNTER — Other Ambulatory Visit: Payer: Self-pay

## 2024-07-21 ENCOUNTER — Encounter (HOSPITAL_BASED_OUTPATIENT_CLINIC_OR_DEPARTMENT_OTHER): Payer: Self-pay | Admitting: Emergency Medicine

## 2024-07-21 ENCOUNTER — Emergency Department (HOSPITAL_BASED_OUTPATIENT_CLINIC_OR_DEPARTMENT_OTHER)

## 2024-07-21 ENCOUNTER — Inpatient Hospital Stay (HOSPITAL_BASED_OUTPATIENT_CLINIC_OR_DEPARTMENT_OTHER)
Admission: EM | Admit: 2024-07-21 | Discharge: 2024-07-23 | DRG: 871 | Disposition: A | Discharge status: Home / Self Care | Attending: Internal Medicine | Admitting: Internal Medicine

## 2024-07-21 DIAGNOSIS — N179 Acute kidney failure, unspecified: Secondary | ICD-10-CM | POA: Diagnosis present

## 2024-07-21 DIAGNOSIS — D849 Immunodeficiency, unspecified: Secondary | ICD-10-CM | POA: Diagnosis present

## 2024-07-21 DIAGNOSIS — M549 Dorsalgia, unspecified: Secondary | ICD-10-CM | POA: Diagnosis not present

## 2024-07-21 DIAGNOSIS — I10 Essential (primary) hypertension: Secondary | ICD-10-CM | POA: Diagnosis not present

## 2024-07-21 DIAGNOSIS — J189 Pneumonia, unspecified organism: Secondary | ICD-10-CM | POA: Diagnosis present

## 2024-07-21 DIAGNOSIS — Z1152 Encounter for screening for COVID-19: Secondary | ICD-10-CM

## 2024-07-21 DIAGNOSIS — Z8249 Family history of ischemic heart disease and other diseases of the circulatory system: Secondary | ICD-10-CM | POA: Diagnosis not present

## 2024-07-21 DIAGNOSIS — E860 Dehydration: Secondary | ICD-10-CM | POA: Diagnosis present

## 2024-07-21 DIAGNOSIS — Z87891 Personal history of nicotine dependence: Secondary | ICD-10-CM | POA: Diagnosis not present

## 2024-07-21 DIAGNOSIS — J441 Chronic obstructive pulmonary disease with (acute) exacerbation: Secondary | ICD-10-CM | POA: Diagnosis not present

## 2024-07-21 DIAGNOSIS — H16009 Unspecified corneal ulcer, unspecified eye: Secondary | ICD-10-CM | POA: Diagnosis not present

## 2024-07-21 DIAGNOSIS — J9601 Acute respiratory failure with hypoxia: Principal | ICD-10-CM | POA: Diagnosis present

## 2024-07-21 DIAGNOSIS — A419 Sepsis, unspecified organism: Principal | ICD-10-CM | POA: Diagnosis present

## 2024-07-21 DIAGNOSIS — J90 Pleural effusion, not elsewhere classified: Secondary | ICD-10-CM | POA: Diagnosis not present

## 2024-07-21 DIAGNOSIS — R0989 Other specified symptoms and signs involving the circulatory and respiratory systems: Secondary | ICD-10-CM | POA: Diagnosis not present

## 2024-07-21 DIAGNOSIS — Z79631 Long term (current) use of antimetabolite agent: Secondary | ICD-10-CM

## 2024-07-21 DIAGNOSIS — J188 Other pneumonia, unspecified organism: Secondary | ICD-10-CM | POA: Diagnosis present

## 2024-07-21 DIAGNOSIS — J9811 Atelectasis: Secondary | ICD-10-CM | POA: Diagnosis present

## 2024-07-21 DIAGNOSIS — I700 Atherosclerosis of aorta: Secondary | ICD-10-CM | POA: Diagnosis not present

## 2024-07-21 DIAGNOSIS — Z79899 Other long term (current) drug therapy: Secondary | ICD-10-CM | POA: Diagnosis not present

## 2024-07-21 DIAGNOSIS — Z7969 Long term (current) use of other immunomodulators and immunosuppressants: Secondary | ICD-10-CM | POA: Diagnosis not present

## 2024-07-21 DIAGNOSIS — M069 Rheumatoid arthritis, unspecified: Secondary | ICD-10-CM | POA: Diagnosis present

## 2024-07-21 DIAGNOSIS — D649 Anemia, unspecified: Secondary | ICD-10-CM | POA: Diagnosis present

## 2024-07-21 DIAGNOSIS — J440 Chronic obstructive pulmonary disease with acute lower respiratory infection: Secondary | ICD-10-CM | POA: Diagnosis present

## 2024-07-21 DIAGNOSIS — Z7952 Long term (current) use of systemic steroids: Secondary | ICD-10-CM

## 2024-07-21 DIAGNOSIS — Z85820 Personal history of malignant melanoma of skin: Secondary | ICD-10-CM

## 2024-07-21 DIAGNOSIS — R0602 Shortness of breath: Secondary | ICD-10-CM | POA: Diagnosis not present

## 2024-07-21 DIAGNOSIS — E781 Pure hyperglyceridemia: Secondary | ICD-10-CM | POA: Diagnosis not present

## 2024-07-21 DIAGNOSIS — R6520 Severe sepsis without septic shock: Secondary | ICD-10-CM | POA: Diagnosis not present

## 2024-07-21 DIAGNOSIS — R918 Other nonspecific abnormal finding of lung field: Secondary | ICD-10-CM | POA: Diagnosis not present

## 2024-07-21 DIAGNOSIS — R059 Cough, unspecified: Secondary | ICD-10-CM | POA: Diagnosis not present

## 2024-07-21 LAB — RESP PANEL BY RT-PCR (RSV, FLU A&B, COVID)  RVPGX2
Influenza A by PCR: NEGATIVE
Influenza B by PCR: NEGATIVE
Resp Syncytial Virus by PCR: NEGATIVE
SARS Coronavirus 2 by RT PCR: NEGATIVE

## 2024-07-21 LAB — I-STAT VENOUS BLOOD GAS, ED
Acid-base deficit: 2 mmol/L (ref 0.0–2.0)
Bicarbonate: 21.1 mmol/L (ref 20.0–28.0)
Calcium, Ion: 1.31 mmol/L (ref 1.15–1.40)
HCT: 40 % (ref 39.0–52.0)
Hemoglobin: 13.6 g/dL (ref 13.0–17.0)
O2 Saturation: 99 %
Patient temperature: 100.8
Potassium: 4.6 mmol/L (ref 3.5–5.1)
Sodium: 135 mmol/L (ref 135–145)
TCO2: 22 mmol/L (ref 22–32)
pCO2, Ven: 32 mmHg — ABNORMAL LOW (ref 44–60)
pH, Ven: 7.431 — ABNORMAL HIGH (ref 7.25–7.43)
pO2, Ven: 145 mmHg — ABNORMAL HIGH (ref 32–45)

## 2024-07-21 LAB — URINALYSIS, ROUTINE W REFLEX MICROSCOPIC
Bilirubin Urine: NEGATIVE
Glucose, UA: NEGATIVE mg/dL
Hgb urine dipstick: NEGATIVE
Ketones, ur: NEGATIVE mg/dL
Leukocytes,Ua: NEGATIVE
Nitrite: NEGATIVE
Protein, ur: NEGATIVE mg/dL
Specific Gravity, Urine: 1.025 (ref 1.005–1.030)
pH: 5.5 (ref 5.0–8.0)

## 2024-07-21 LAB — CBC WITH DIFFERENTIAL/PLATELET
Abs Immature Granulocytes: 0.13 K/uL — ABNORMAL HIGH (ref 0.00–0.07)
Basophils Absolute: 0.1 K/uL (ref 0.0–0.1)
Basophils Relative: 0 %
Eosinophils Absolute: 0.2 K/uL (ref 0.0–0.5)
Eosinophils Relative: 1 %
HCT: 38.5 % — ABNORMAL LOW (ref 39.0–52.0)
Hemoglobin: 13.3 g/dL (ref 13.0–17.0)
Immature Granulocytes: 1 %
Lymphocytes Relative: 8 %
Lymphs Abs: 1.7 K/uL (ref 0.7–4.0)
MCH: 30.8 pg (ref 26.0–34.0)
MCHC: 34.5 g/dL (ref 30.0–36.0)
MCV: 89.1 fL (ref 80.0–100.0)
Monocytes Absolute: 1.1 K/uL — ABNORMAL HIGH (ref 0.1–1.0)
Monocytes Relative: 5 %
Neutro Abs: 18 K/uL — ABNORMAL HIGH (ref 1.7–7.7)
Neutrophils Relative %: 85 %
Platelets: 383 K/uL (ref 150–400)
RBC: 4.32 MIL/uL (ref 4.22–5.81)
RDW: 13.3 % (ref 11.5–15.5)
WBC: 21.2 K/uL — ABNORMAL HIGH (ref 4.0–10.5)
nRBC: 0 % (ref 0.0–0.2)

## 2024-07-21 LAB — COMPREHENSIVE METABOLIC PANEL WITH GFR
ALT: 10 U/L (ref 0–44)
AST: 12 U/L — ABNORMAL LOW (ref 15–41)
Albumin: 3.9 g/dL (ref 3.5–5.0)
Alkaline Phosphatase: 80 U/L (ref 38–126)
Anion gap: 15 (ref 5–15)
BUN: 32 mg/dL — ABNORMAL HIGH (ref 8–23)
CO2: 20 mmol/L — ABNORMAL LOW (ref 22–32)
Calcium: 10.6 mg/dL — ABNORMAL HIGH (ref 8.9–10.3)
Chloride: 102 mmol/L (ref 98–111)
Creatinine, Ser: 1.54 mg/dL — ABNORMAL HIGH (ref 0.61–1.24)
GFR, Estimated: 50 mL/min — ABNORMAL LOW (ref >60)
Glucose, Bld: 123 mg/dL — ABNORMAL HIGH (ref 70–99)
Potassium: 4.6 mmol/L (ref 3.5–5.1)
Sodium: 137 mmol/L (ref 135–145)
Total Bilirubin: 0.7 mg/dL (ref 0.0–1.2)
Total Protein: 7.5 g/dL (ref 6.5–8.1)

## 2024-07-21 LAB — LACTIC ACID, PLASMA: Lactic Acid, Venous: 0.8 mmol/L (ref 0.5–1.9)

## 2024-07-21 LAB — MRSA NEXT GEN BY PCR, NASAL: MRSA by PCR Next Gen: NOT DETECTED

## 2024-07-21 LAB — TROPONIN T, HIGH SENSITIVITY: Troponin T High Sensitivity: 23 ng/L — ABNORMAL HIGH (ref 0–19)

## 2024-07-21 MED ORDER — HYDROMORPHONE HCL 1 MG/ML IJ SOLN
0.5000 mg | INTRAMUSCULAR | Status: DC | PRN
  Administered 2024-07-21 (×2): 0.5 mg via INTRAVENOUS
  Filled 2024-07-21 (×2): qty 1

## 2024-07-21 MED ORDER — ENOXAPARIN SODIUM 40 MG/0.4ML IJ SOSY
40.0000 mg | PREFILLED_SYRINGE | INTRAMUSCULAR | Status: DC
  Administered 2024-07-21 – 2024-07-23 (×3): 40 mg via SUBCUTANEOUS
  Filled 2024-07-21 (×3): qty 0.4

## 2024-07-21 MED ORDER — LACTATED RINGERS IV BOLUS
1000.0000 mL | Freq: Once | INTRAVENOUS | Status: DC
Start: 2024-07-21 — End: 2024-07-21

## 2024-07-21 MED ORDER — ONDANSETRON HCL 4 MG/2ML IJ SOLN
INTRAMUSCULAR | Status: AC
  Filled 2024-07-21: qty 2

## 2024-07-21 MED ORDER — DOXYCYCLINE HYCLATE 100 MG PO TABS: 50.0000 mg | ORAL_TABLET | Freq: Every day | ORAL | Status: DC

## 2024-07-21 MED ORDER — PREDNISONE 20 MG PO TABS
40.0000 mg | ORAL_TABLET | Freq: Every day | ORAL | Status: DC
  Administered 2024-07-22 – 2024-07-23 (×2): 40 mg via ORAL
  Filled 2024-07-21 (×2): qty 2

## 2024-07-21 MED ORDER — ATORVASTATIN CALCIUM 40 MG PO TABS
40.0000 mg | ORAL_TABLET | Freq: Every day | ORAL | Status: DC
  Administered 2024-07-21 – 2024-07-23 (×3): 40 mg via ORAL
  Filled 2024-07-21 (×3): qty 1

## 2024-07-21 MED ORDER — SODIUM CHLORIDE 0.9 % IV SOLN
2.0000 g | Freq: Once | INTRAVENOUS | Status: AC
  Administered 2024-07-21: 2 g via INTRAVENOUS
  Filled 2024-07-21: qty 20

## 2024-07-21 MED ORDER — LACTATED RINGERS IV BOLUS (SEPSIS)
1000.0000 mL | Freq: Once | INTRAVENOUS | Status: DC
Start: 2024-07-21 — End: 2024-07-23

## 2024-07-21 MED ORDER — ALBUTEROL SULFATE (2.5 MG/3ML) 0.083% IN NEBU: 2.5000 mg | INHALATION_SOLUTION | RESPIRATORY_TRACT | Status: DC | PRN

## 2024-07-21 MED ORDER — IOHEXOL 350 MG/ML SOLN
75.0000 mL | Freq: Once | INTRAVENOUS | Status: AC | PRN
  Administered 2024-07-21: 75 mL via INTRAVENOUS

## 2024-07-21 MED ORDER — ORAL CARE MOUTH RINSE: 15.0000 mL | OROMUCOSAL | Status: DC | PRN

## 2024-07-21 MED ORDER — ACETAMINOPHEN 650 MG RE SUPP: 650.0000 mg | Freq: Four times a day (QID) | RECTAL | Status: DC | PRN

## 2024-07-21 MED ORDER — ONDANSETRON HCL 4 MG/2ML IJ SOLN: 4.0000 mg | Freq: Four times a day (QID) | INTRAMUSCULAR | Status: DC | PRN

## 2024-07-21 MED ORDER — MORPHINE SULFATE (PF) 2 MG/ML IV SOLN
2.0000 mg | Freq: Once | INTRAVENOUS | Status: AC
  Administered 2024-07-21: 2 mg via INTRAVENOUS
  Filled 2024-07-21: qty 1

## 2024-07-21 MED ORDER — FOLIC ACID 1 MG PO TABS
1.0000 mg | ORAL_TABLET | Freq: Every day | ORAL | Status: DC
  Administered 2024-07-21 – 2024-07-23 (×3): 1 mg via ORAL
  Filled 2024-07-21 (×3): qty 1

## 2024-07-21 MED ORDER — LACTATED RINGERS IV BOLUS (SEPSIS)
1000.0000 mL | Freq: Once | INTRAVENOUS | Status: AC
  Administered 2024-07-21: 1000 mL via INTRAVENOUS

## 2024-07-21 MED ORDER — IPRATROPIUM-ALBUTEROL 0.5-2.5 (3) MG/3ML IN SOLN
3.0000 mL | Freq: Once | RESPIRATORY_TRACT | Status: AC
  Administered 2024-07-21: 3 mL via RESPIRATORY_TRACT
  Filled 2024-07-21: qty 3

## 2024-07-21 MED ORDER — MAGNESIUM SULFATE 2 GM/50ML IV SOLN
2.0000 g | Freq: Once | INTRAVENOUS | Status: AC
  Administered 2024-07-21: 2 g via INTRAVENOUS
  Filled 2024-07-21: qty 50

## 2024-07-21 MED ORDER — FENTANYL CITRATE (PF) 50 MCG/ML IJ SOSY
50.0000 ug | PREFILLED_SYRINGE | INTRAMUSCULAR | Status: AC | PRN
  Administered 2024-07-21 (×3): 50 ug via INTRAVENOUS
  Filled 2024-07-21 (×3): qty 1

## 2024-07-21 MED ORDER — ACETAMINOPHEN 325 MG PO TABS
650.0000 mg | ORAL_TABLET | Freq: Four times a day (QID) | ORAL | Status: DC | PRN
  Administered 2024-07-23: 650 mg via ORAL
  Filled 2024-07-21: qty 2

## 2024-07-21 MED ORDER — IPRATROPIUM-ALBUTEROL 0.5-2.5 (3) MG/3ML IN SOLN
9.0000 mL | Freq: Once | RESPIRATORY_TRACT | Status: AC
  Administered 2024-07-21: 9 mL via RESPIRATORY_TRACT
  Filled 2024-07-21: qty 9

## 2024-07-21 MED ORDER — METHYLPREDNISOLONE SODIUM SUCC 125 MG IJ SOLR
125.0000 mg | Freq: Once | INTRAMUSCULAR | Status: AC
  Administered 2024-07-21: 125 mg via INTRAVENOUS
  Filled 2024-07-21: qty 2

## 2024-07-21 MED ORDER — DOXYCYCLINE HYCLATE 100 MG PO TABS
100.0000 mg | ORAL_TABLET | Freq: Two times a day (BID) | ORAL | Status: DC
Start: 2024-07-21 — End: 2024-07-26
  Administered 2024-07-21 – 2024-07-23 (×4): 100 mg via ORAL
  Filled 2024-07-21 (×4): qty 1

## 2024-07-21 MED ORDER — SODIUM CHLORIDE 3 % IN NEBU
4.0000 mL | INHALATION_SOLUTION | Freq: Once | RESPIRATORY_TRACT | Status: AC
  Administered 2024-07-21: 4 mL via RESPIRATORY_TRACT
  Filled 2024-07-21: qty 4

## 2024-07-21 MED ORDER — ONDANSETRON HCL 4 MG PO TABS: 4.0000 mg | ORAL_TABLET | Freq: Four times a day (QID) | ORAL | Status: DC | PRN

## 2024-07-21 MED ORDER — SODIUM CHLORIDE 0.9 % IV SOLN: 500.0000 mg | INTRAVENOUS | Status: DC

## 2024-07-21 MED ORDER — CHLORHEXIDINE GLUCONATE CLOTH 2 % EX PADS
6.0000 | MEDICATED_PAD | Freq: Every day | CUTANEOUS | Status: DC
  Administered 2024-07-21 – 2024-07-23 (×2): 6 via TOPICAL

## 2024-07-21 MED ORDER — TRAZODONE HCL 50 MG PO TABS
25.0000 mg | ORAL_TABLET | Freq: Every evening | ORAL | Status: DC | PRN
  Administered 2024-07-21 – 2024-07-22 (×2): 25 mg via ORAL
  Filled 2024-07-21 (×2): qty 1

## 2024-07-21 MED ORDER — ONDANSETRON HCL 4 MG/2ML IJ SOLN
4.0000 mg | Freq: Once | INTRAMUSCULAR | Status: AC
  Administered 2024-07-21: 4 mg via INTRAVENOUS

## 2024-07-21 MED ORDER — SODIUM CHLORIDE 0.9 % IV SOLN
500.0000 mg | Freq: Once | INTRAVENOUS | Status: AC
  Administered 2024-07-21: 500 mg via INTRAVENOUS
  Filled 2024-07-21: qty 5

## 2024-07-21 MED ORDER — SODIUM CHLORIDE 0.9 % IV SOLN
1.0000 g | INTRAVENOUS | Status: DC
  Administered 2024-07-22 – 2024-07-23 (×2): 1 g via INTRAVENOUS
  Filled 2024-07-21 (×3): qty 10

## 2024-07-21 MED ORDER — LACTATED RINGERS IV SOLN: INTRAVENOUS | Status: DC

## 2024-07-21 MED ORDER — IPRATROPIUM-ALBUTEROL 0.5-2.5 (3) MG/3ML IN SOLN
3.0000 mL | Freq: Four times a day (QID) | RESPIRATORY_TRACT | Status: DC
  Administered 2024-07-21 – 2024-07-22 (×7): 3 mL via RESPIRATORY_TRACT
  Filled 2024-07-21 (×7): qty 3

## 2024-07-21 NOTE — ED Triage Notes (Signed)
 Cough x 3 days, and pain to R side of back since Friday. Pt reports fever tmax 102. He reports this back pain is tender to touch. RRT assessed- diminished BIL. Dyspnea at rest. Oxygen saturation 93, HR 120s.

## 2024-07-21 NOTE — Progress Notes (Addendum)
 Patient instructed with flutter valve. Stated he has one at home that her uses. Having a hard time doing it due to pain on inspiration. Neb treatment being administered and we will try again.

## 2024-07-21 NOTE — ED Provider Notes (Addendum)
 CTA interpretation pending. Per my review large infiltrate. On high flow O2, stabilizing but still increased WOB.  Physical Exam  BP 101/67   Pulse (!) 124   Temp 100.3 F (37.9 C)   Resp (!) 28   Ht 6' (1.829 m)   Wt 96.2 kg   SpO2 96%   BMI 28.75 kg/m   Physical Exam  Procedures  Procedures  ED Course / MDM    Medical Decision Making Amount and/or Complexity of Data Reviewed Labs: ordered. Radiology: ordered.  Risk Prescription drug management. Decision regarding hospitalization.   07: 10 I have reviewed CT imaging personally.  I do not appreciate PE.  There is a large dense consolidation right lower lobe. I have reviewed patient's EMR and pulmonology notes.  Patient has history of bronchiectasis with recommendations for use of flutter valve and hypertonic saline during flareups.  Will have respiratory try flutter valve and hypertonic saline nebulizer and repeat a DuoNeb. I have personally reassessed the patient.  He is alert with clear mental status.  He has tachypnea but does not show signs of impending respiratory failure.  He is currently on high flow nasal cannula oxygen and has had 1 DuoNeb at 6 AM, Solu-Medrol , morphine, Rocephin , Zithromax  and magnesium ..  He qualifies his breathing is feeling improved compared to upon arrival.  On auscultation he does have bilateral breath sounds with significantly diminished breath sounds at the right base.  At this time, I do not think patient needs escalation of respiratory support however I did discuss possibility of BiPAP or intubation with the patient and his wife in the circumstance that his respiratory status should worsen.  They voiced understanding.  Will plan for admission.  Consult: Reviewed with Dr. Roxane for admission   09: 10 recheck: Patient denies that he feels his breathing has gotten any worse.  Patient remains between 93 to 96% on high flow nasal cannula oxygen.  Patient reports however he is having a lot of pain  with inspiration.  This is consistent with significant right sided pneumonia.  Will administer additional pain medication with 1/2 mg Dilaudid .  Patient's blood pressures have been trending down since arrival.  Patient arrived normotensive at 115/61.  Pressures have trended down to 97/62.  Temperature is 100.9.  Patient has significant leukocytosis with source.  He meets sepsis criteria.  At this time we will initiate sepsis protocol.  Patient has already had Zithromax  and Rocephin  for community-acquired pneumonia.  Will continue to monitor closely. CRITICAL CARE Performed by: Ludivina Shines   Total critical care time: 30 minutes  Critical care time was exclusive of separately billable procedures and treating other patients.  Critical care was necessary to treat or prevent imminent or life-threatening deterioration.  Critical care was time spent personally by me on the following activities: development of treatment plan with patient and/or surrogate as well as nursing, discussions with consultants, evaluation of patient's response to treatment, examination of patient, obtaining history from patient or surrogate, ordering and performing treatments and interventions, ordering and review of laboratory studies, ordering and review of radiographic studies, pulse oximetry and re-evaluation of patient's condition.     Shines Ludivina, MD 07/21/24 SHERRIAN    Shines Ludivina, MD 07/21/24 [id number]    Shines Ludivina, MD 07/21/24 [phone]

## 2024-07-21 NOTE — ED Notes (Addendum)
 Third attempt to call report. Inpatient nurse's name is John per the [employer].

## 2024-07-21 NOTE — ED Notes (Signed)
 Attempted to call report. RN is busy in a room, so will call back in approx 15 mins.

## 2024-07-21 NOTE — ED Provider Notes (Signed)
 [facility] [place] Provider Note   CSN: [id number] Arrival date & time: 07/21/24  9485     History No chief complaint on file.   HPI Todd Valdez is a 63 y.o. male presenting for fever cough congestion shortness of breath in the setting of COPD.  Recently ill but has been getting better until over the last 48 hours he had acute worsening of shortness of breath.  Endorses fever and cough nonproductive at this time.  History of COPD recently referred to pulmonology has formal testing scheduled in 2 months..   Patient's recorded medical, surgical, social, medication list and allergies were reviewed in the Snapshot window as part of the initial history.   Review of Systems   Review of Systems  Constitutional:  Positive for fever. Negative for chills.  HENT:  Negative for ear pain and sore throat.   Eyes:  Negative for pain and visual disturbance.  Respiratory:  Positive for cough and shortness of breath.   Cardiovascular:  Negative for chest pain and palpitations.  Gastrointestinal:  Negative for abdominal pain and vomiting.  Genitourinary:  Negative for dysuria and hematuria.  Musculoskeletal:  Negative for arthralgias and back pain.  Skin:  Negative for color change and rash.  Neurological:  Negative for seizures and syncope.  All other systems reviewed and are negative.   Physical Exam Updated Vital Signs BP 101/67   Pulse (!) 124   Temp 100.3 F (37.9 C)   Resp (!) 28   Ht 6' (1.829 m)   Wt 96.2 kg   SpO2 96%   BMI 28.75 kg/m  Physical Exam Vitals and nursing note reviewed.  Constitutional:      General: He is not in acute distress.    Appearance: He is well-developed.  HENT:     Head: Normocephalic and atraumatic.  Eyes:     Conjunctiva/sclera: Conjunctivae normal.  Cardiovascular:     Rate and Rhythm: Normal rate and regular rhythm.     Heart sounds: No murmur heard. Pulmonary:     Effort: Respiratory distress present.      Breath sounds: Rhonchi present.  Abdominal:     Palpations: Abdomen is soft.     Tenderness: There is no abdominal tenderness.  Musculoskeletal:        General: No swelling.     Cervical back: Neck supple.  Skin:    General: Skin is warm and dry.     Capillary Refill: Capillary refill takes less than 2 seconds.  Neurological:     Mental Status: He is alert.  Psychiatric:        Mood and Affect: Mood normal.      ED Course/ Medical Decision Making/ A&P    Procedures .Critical Care  Performed by: Jerral Meth, MD Authorized by: Jerral Meth, MD   Critical care provider statement:    Critical care time (minutes):  30   Critical care was necessary to treat or prevent imminent or life-threatening deterioration of the following conditions:  Respiratory failure   Critical care was time spent personally by me on the following activities:  Development of treatment plan with patient or surrogate, discussions with consultants, evaluation of patient's response to treatment, examination of patient, ordering and review of laboratory studies, ordering and review of radiographic studies, ordering and performing treatments and interventions, pulse oximetry, re-evaluation of patient's condition and review of old charts   Care discussed with: admitting provider      Medications Ordered in  ED Medications  azithromycin  (ZITHROMAX ) 500 mg in sodium chloride  0.9 % 250 mL IVPB (has no administration in time range)  fentaNYL  (SUBLIMAZE ) injection 50 mcg (50 mcg Intravenous Given 07/21/24 0641)  iohexol  (OMNIPAQUE ) 350 MG/ML injection 75 mL (has no administration in time range)  ipratropium-albuterol  (DUONEB) 0.5-2.5 (3) MG/3ML nebulizer solution 9 mL (9 mLs Nebulization Given 07/21/24 0558)  methylPREDNISolone  sodium succinate (SOLU-MEDROL ) 125 mg/2 mL injection 125 mg (125 mg Intravenous Given 07/21/24 0555)  magnesium  sulfate IVPB 2 g 50 mL (2 g Intravenous New Bag/Given 07/21/24  0556)  cefTRIAXone  (ROCEPHIN ) 2 g in sodium chloride  0.9 % 100 mL IVPB (0 g Intravenous Stopped 07/21/24 0645)  morphine (PF) 2 MG/ML injection 2 mg (2 mg Intravenous Given 07/21/24 0610)  ondansetron  (ZOFRAN ) injection 4 mg (4 mg Intravenous Given 07/21/24 0616)   Medical Decision Making:   Todd Valdez is a 63 y.o. male with a history of COPD , who presented to the ED today with acute on chronic SOB. They are endorsing worsening of their baseline dyspnea over the past 48 hours. Their baseline is no O2 requirement. At their baseline they are able to get around the neighborhood and they are not able to at this time.   On my initial exam, the pt was SOB and tachypneic. Audible wheezing and grossly decreased breath sounds appreciated.  They are endorsing increased sputum production.    Reviewed and confirmed nursing documentation for past medical history, family history, social history.    Initial Assessment:   With the patient's presentation of SOB in the above setting, most likely diagnosis is COPD Exacerbation. Other diagnoses were considered including (but not limited to) CAP, PE, ACS, viral infection, PTX. These are considered less likely due to history of present illness and physical exam findings.   This is most consistent with an acute life/limb threatening illness complicated by underlying chronic conditions.  Initial Plan:  Empiric treatment of patient's symptoms with immediate initiation of inhaled bronchodilators and IV steroids. Given advanced nature of patient's presentation, will proceed with IV magnesium  as a rescue therapy.   Evaluation for ACS with EKG and delta troponin  Evaluation for infectious versus intrathoracic abnormality with chest x-ray  Evaluation for volume overload with BNP  Screening labs including CBC and Metabolic panel to evaluate for infectious or metabolic etiology of disease.  Additionally will get a CTA PE study to evaluate for pulmonary embolism given  severity and sudden onset acuity of symptoms. Objective evaluation as below reviewed   Reassessment and Plan:   After initiation of medical therapies, patient is gradually improving.  Pain is better controlled, tachypnea gradually improving after 3 DuoNebs, Solu-Medrol , magnesium .  Patient's temperature grew to 100.9 meeting concern for likely infection. Antibiotics were initiated.  CT angiography of the chest is pending at time of signout to oncoming team, anticipate admission for COPD exacerbation/pneumonia pending the study.  Clinical Impression:  1. Acute hypoxic respiratory failure (HCC)      Data Unavailable   Final Clinical Impression(s) / ED Diagnoses Final diagnoses:  Acute hypoxic respiratory failure [facility])    Rx / DC Orders ED Discharge Orders     None         Jerral Meth, MD 07/21/24 [phone]

## 2024-07-21 NOTE — ED Notes (Signed)
 Patient instructed with flutter valve. Stated he has one at home that her uses. Having a hard time doing it due to pain on inspiration. Neb treatment being administered and we will try again.

## 2024-07-21 NOTE — ED Notes (Signed)
 Attempted to call report a second time. Sent to voicemail.

## 2024-07-21 NOTE — Sepsis Progress Note (Signed)
 Elink will follow per sepsis protocol.

## 2024-07-21 NOTE — ED Notes (Signed)
 Patient was placed on 3L [place] due to RR, splinting and HR. Patient was placed on 6L [place] device to to patients' RR and WOB. At 0630 patient O2 was increased to 8L [place] due to continued WOB, splinting and RR. Patient oxygen saturating 97%. Patient tolerating well.

## 2024-07-21 NOTE — ED Notes (Signed)
 Back from CT

## 2024-07-21 NOTE — ED Notes (Signed)
 Report called to Elenor Bars, RRT at [facility]. Patient just left with [facility]

## 2024-07-21 NOTE — H&P (Addendum)
 History and Physical  Martha Soltys FMW:[id number] DOB: [birth year] DOA: 07/21/2024  PCP: Alvia Bring, DO   Chief Complaint: Cough, shortness of breath  HPI: Todd Valdez is a 63 y.o. male with medical history significant for rheumatoid arthritis on methotrexate  and Orencia  as well as bronchiectasis and suspected COPD being admitted to the hospital with severe sepsis due to community-acquired pneumonia.  Patient states that he was in his usual state of health until about 3 days ago, when he started having nonproductive cough, shortness of breath, dyspnea with exertion.  He also developed right sided abdominal and flank discomfort, which he attributes to coughing, states that actually his whole body hurts.  On evaluation in the emergency department, he was found to have evidence of sepsis, and imaging with consolidation and associated right-sided atelectasis.  He was started on empiric IV antibiotics, placed on high flow nasal cannula oxygen and admitted to the hospitalist service.  Review of Systems: Please see HPI for pertinent positives and negatives. A complete 10 system review of systems are otherwise negative.  Past Medical History:  Diagnosis Date   Arthritis    Cancer (HCC)    Melanoma on  back   Diverticulitis    GERD (gastroesophageal reflux disease)    High triglycerides    History of abscess of skin and subcutaneous tissue    right forearm, chin   Hypertension    RA (rheumatoid arthritis) (HCC)    Reflux esophagitis    Right inguinal hernia    Sleep apnea    no cpap   Past Surgical History:  Procedure Laterality Date   bilateral feet reconstruction  10/2000   COLONOSCOPY     CYSTOSCOPY W/ URETERAL STENT PLACEMENT Left 11/01/2023   Procedure: CYSTOSCOPY WITH RETROGRADE PYELOGRAM/URETERAL STENT PLACEMENT;  Surgeon: Nieves Cough, MD;  Location: [facility];  Service: Urology;  Laterality: Left;   feet surgery     INGUINAL HERNIA REPAIR Right 11/27/2017   Procedure: OPEN RIGHT  INGUINAL HERNIA REPAIR;  Surgeon: Eletha Boas, MD;  Location: [facility] [place];  Service: General;  Laterality: Right;   INSERTION OF MESH Right 11/27/2017   Procedure: INSERTION OF MESH;  Surgeon: Eletha Boas, MD;  Location: [facility] [place];  Service: General;  Laterality: Right;   MELANOMA EXCISION     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   Procedure: Right transmetatarsal amputation and heelcord lengthening;  Surgeon: Kit Rush, MD;  Location: [facility];  Service: Orthopedics;  Laterality: Right;   UPPER GI ENDOSCOPY  12/2011   Social History:  reports that he quit smoking about 33 years ago. His smoking use included cigarettes. He quit smokeless tobacco use about 14 years ago. He reports that he does not drink alcohol and does not use drugs.  No Known Allergies  Family History  Problem Relation Age of Onset   Hypertension Mother    Hypertension Father      Prior to Admission medications   Medication Sig Start Date End Date Taking? Authorizing Provider  Abatacept  (ORENCIA  CLICKJECT) 125 MG/ML SOAJ Inject 1 (125mg ) subcutaneously once a week. Subcutaneous 28 days 05/05/24     albuterol  (VENTOLIN  HFA) 108 (90 Base) MCG/ACT inhaler Inhale 2 puffs into the lungs every 6 (six) hours as needed for wheezing or shortness of breath. 05/07/24   Kara Dorn NOVAK, MD  doxycycline  (ADOXA) 50 MG tablet Take 1 tablet (50 mg total) by mouth daily. 05/02/24     esomeprazole  (NEXIUM ) 40 MG capsule Take 1 capsule (40  mg total) by mouth daily at 12 noon. 06/18/24 06/18/25  Crain, Benton L, PA  Evolocumab  (REPATHA  SURECLICK) 140 MG/ML SOAJ Inject 140 mg into the skin every 14 (fourteen) days. 06/25/24   Alvia Bring, DO  Fluticasone -Umeclidin-Vilant (TRELEGY ELLIPTA ) 200-62.5-25 MCG/ACT AEPB Inhale 1 puff by mouth into the lungs daily. 06/25/24   Alvia Bring, DO  folic acid  (FOLVITE ) 1 MG tablet Take 1 tablet (1 mg total) by mouth daily. 03/31/24      lisinopril -hydrochlorothiazide  (ZESTORETIC ) 20-25 MG tablet Take 1 tablet by mouth daily. 02/07/24 02/06/25  Curtis Debby PARAS, MD  methotrexate  250 MG/10ML injection Inject 1 mL (25 mg total) into the muscle once a week. 12/10/23     methotrexate  50 MG/2ML injection Inject 1 mL (25 mg total) once a week. 06/13/22     niacin  (,VITAMIN B3,) 500 MG tablet Take 1 tablet (500 mg total) by mouth at bedtime. 08/21/23   Curtis Debby PARAS, MD  ondansetron  (ZOFRAN -ODT) 8 MG disintegrating tablet Take 1 tablet (8 mg total) by mouth every 8 (eight) hours as needed for nausea. 12/04/23   Curtis Debby PARAS, MD  predniSONE  (DELTASONE ) 1 MG tablet Take 1 tablet (1 mg total) by mouth daily with breakfast. 11/04/23   Perri DELENA Meliton Mickey., MD  predniSONE  (DELTASONE ) 5 MG tablet Take 1 tablet (5 mg total) by mouth daily. 07/14/24     promethazine -dextromethorphan (PROMETHAZINE -DM) 6.25-15 MG/5ML syrup Take 5 mLs by mouth 2 (two) times daily as needed for cough. 10/06/22   Teddy Sharper, FNP  sodium chloride  HYPERTONIC 3 % nebulizer solution Take by nebulization 2 (two) times daily as needed. 05/07/24   Kara Dorn NOVAK, MD  TUBERCULIN SYR 1CC/27GX1/2 (B-D TB SYRINGE 1CC/27GX1/2) 27G X 1/2 1 ML MISC use to inject methotrexate  once a week 02/16/21     TUBERCULIN SYR 1CC/27GX1/2 27G X 1/2 1 ML MISC Use as directed once a week for Methotrexate . 12/10/23     Tuberculin-Allergy  Syringes 27G X 1/2 1 ML MISC Use once a week for subcutaneous MTX injection 06/13/22       Physical Exam: BP 105/66 (BP Location: Right Arm)   Pulse 96   Temp 98.5 F (36.9 C) (Oral)   Resp (!) 23   Ht 6' (1.829 m)   Wt 83.3 kg   SpO2 93%   BMI 24.91 kg/m  General:  Alert, oriented, calm, in no acute distress, wearing 6 L nasal cannula.  Able to speak 5 or 6 words at a time.  His wife is at the bedside. Cardiovascular: RRR, no murmurs or rubs, no peripheral edema  Respiratory: Diffuse rhonchi, left basilar crackle, no breath  sounds in the right base.  No active wheezing. Abdomen: soft, nontender, nondistended, normal bowel tones heard  Skin: dry, no rashes  Musculoskeletal: no joint effusions, normal range of motion, rheumatoid changes in his extremities, status post right metatarsal amputation Psychiatric: appropriate affect, normal speech  Neurologic: extraocular muscles intact, clear speech, moving all extremities with intact sensorium         Labs on Admission:  Basic Metabolic Panel: Recent Labs  Lab 07/21/24 0547 07/21/24 0610  NA 137 135  K 4.6 4.6  CL 102  --   CO2 20*  --   GLUCOSE 123*  --   BUN 32*  --   CREATININE 1.54*  --   CALCIUM  10.6*  --    Liver Function Tests: Recent Labs  Lab 07/21/24 0547  AST 12*  ALT 10  ALKPHOS 80  BILITOT 0.7  PROT 7.5  ALBUMIN 3.9   No results for input(s): LIPASE, AMYLASE in the last 168 hours. No results for input(s): AMMONIA in the last 168 hours. CBC: Recent Labs  Lab 07/21/24 0547 07/21/24 0610  WBC 21.2*  --   NEUTROABS 18.0*  --   HGB 13.3 13.6  HCT 38.5* 40.0  MCV 89.1  --   PLT 383  --    Cardiac Enzymes: No results for input(s): CKTOTAL, CKMB, CKMBINDEX, TROPONINI in the last 168 hours. BNP (last 3 results) No results for input(s): BNP in the last 8760 hours.  ProBNP (last 3 results) No results for input(s): PROBNP in the last 8760 hours.  CBG: No results for input(s): GLUCAP in the last 168 hours.  Radiological Exams on Admission: CT Angio Chest Pulmonary Embolism (PE) W or WO Contrast Result Date: 07/21/2024 CLINICAL DATA:  3 day history of cough and right-sided back pain. Clinical concern for pulmonary embolus. EXAM: CT ANGIOGRAPHY CHEST WITH CONTRAST TECHNIQUE: Multidetector CT imaging of the chest was performed using the standard protocol during bolus administration of intravenous contrast. Multiplanar CT image reconstructions and MIPs were obtained to evaluate the vascular anatomy. RADIATION DOSE  REDUCTION: This exam was performed according to the departmental dose-optimization program which includes automated exposure control, adjustment of the mA and/or kV according to patient size and/or use of iterative reconstruction technique. CONTRAST:  75mL OMNIPAQUE  IOHEXOL  350 MG/ML SOLN COMPARISON:  02/18/2024 FINDINGS: Cardiovascular: The heart size is normal. No substantial pericardial effusion. Mild atherosclerotic calcification is noted in the wall of the thoracic aorta. Image quality degraded by patient breathing motion during image acquisition. Within this limitation, no definite acute pulmonary embolus. Mediastinum/Nodes: No mediastinal lymphadenopathy. There is no hilar lymphadenopathy. Moderate hiatal hernia. The esophagus has normal imaging features. There is no axillary lymphadenopathy. Lungs/Pleura: Right lower lobe collapse/consolidation adjacent 2 asymmetric elevation of the right hemidiaphragm. Collapse/consolidative opacity seen in the lingula and posterior left lower lobe. Patchy/nodular ground-glass opacity noted right upper lobe. No substantial pleural effusion. Upper Abdomen: Visualized portion of the upper abdomen shows no acute findings. Musculoskeletal: No worrisome lytic or sclerotic osseous abnormality. Review of the MIP images confirms the above findings. IMPRESSION: 1. Image quality degraded by patient breathing motion during image acquisition. Within this limitation, no definite acute pulmonary embolus. 2. Right lower lobe collapse/consolidation adjacent to asymmetric elevation of the right hemidiaphragm. Collapse/consolidative opacity in the lingula and posterior left lower lobe. Imaging features are compatible with multifocal pneumonia. 3. Patchy/nodular ground-glass opacity right upper lobe, likely infectious/inflammatory. 4. Moderate hiatal hernia. 5.  Aortic Atherosclerosis (ICD10-I70.0). Electronically Signed   By: Camellia Candle M.D.   On: 07/21/2024 07:35   DG Chest 2  View Result Date: 07/21/2024 CLINICAL DATA:  Cough, fever, and shortness of breath for several days. EXAM: CHEST - 2 VIEW COMPARISON:  11/01/2023 FINDINGS: Very low lung volumes are seen, with increased bibasilar atelectasis. No pneumothorax or pleural effusion identified. IMPRESSION: Very low lung volumes with bibasilar atelectasis. Electronically Signed   By: Norleen DELENA Kil M.D.   On: 07/21/2024 06:53   Assessment/Plan Daved Mcfann is a 62 y.o. male with medical history significant for rheumatoid arthritis on methotrexate  and Orencia  as well as bronchiectasis and suspected COPD being admitted to the hospital with severe sepsis due to community-acquired pneumonia.   Severe sepsis-meeting criteria with fever, tachycardia, hypotension, endorgan dysfunction with AKI.  The patient is hypotensive, he is hemodynamically stable and lactic acid is normal.  Will have a low  threshold to intensify antibiotics, and this immunocompromised patient. -Inpatient admission -Monitor on stepdown -Continue empiric IV azithromycin  and baseline p.o. doxycycline  which he takes for ocular effects of his RA -he appears to have received 1 L fluid bolus, will give another 1 L bolus now -Continue LR at 150 cc/h thereafter, and bolus as needed -Follow-up peripheral blood culture  Rheumatoid arthritis-takes Orencia  every 14 days, methotrexate  weekly, and doxycycline  for associated corneal ulcer  Presumed COPD and atelectasis-related to his community-acquired pneumonia -Scheduled DuoNebs -Received IV Solu-Medrol , will continue p.o. prednisone  from tomorrow for his pulmonary symptoms, note he takes 5 mg p.o. daily for his RA -Incentive spirometer and flutter valve to help with expectoration of sputum  AKI-related to sepsis and likely prerenal, patient has baseline normal renal function -Continue hydration as above -Avoid nephrotoxins and recheck renal function in the morning  DVT prophylaxis: Lovenox      Code Status: Full  Code  Consults called: None  Admission status: The appropriate patient status for this patient is INPATIENT. Inpatient status is judged to be reasonable and necessary in order to provide the required intensity of service to ensure the patient's safety. The patient's presenting symptoms, physical exam findings, and initial radiographic and laboratory data in the context of their chronic comorbidities is felt to place them at high risk for further clinical deterioration. Furthermore, it is not anticipated that the patient will be medically stable for discharge from the hospital within 2 midnights of admission.    I certify that at the point of admission it is my clinical judgment that the patient will require inpatient hospital care spanning beyond 2 midnights from the point of admission due to high intensity of service, high risk for further deterioration and high frequency of surveillance required  Time spent: 59 minutes  Colden Samaras CHRISTELLA Gail MD [facility] Pager [phone]  If 7PM-7AM, please contact night-coverage [email or web address] Password [email or web address]  07/21/2024, 11:00 AM

## 2024-07-22 ENCOUNTER — Encounter (HOSPITAL_BASED_OUTPATIENT_CLINIC_OR_DEPARTMENT_OTHER): Payer: Self-pay

## 2024-07-22 ENCOUNTER — Other Ambulatory Visit (HOSPITAL_BASED_OUTPATIENT_CLINIC_OR_DEPARTMENT_OTHER): Payer: Self-pay

## 2024-07-22 ENCOUNTER — Encounter (HOSPITAL_COMMUNITY): Payer: Self-pay

## 2024-07-22 ENCOUNTER — Other Ambulatory Visit (HOSPITAL_COMMUNITY): Payer: Self-pay

## 2024-07-22 ENCOUNTER — Other Ambulatory Visit: Payer: Self-pay

## 2024-07-22 DIAGNOSIS — J188 Other pneumonia, unspecified organism: Secondary | ICD-10-CM | POA: Diagnosis not present

## 2024-07-22 LAB — BASIC METABOLIC PANEL WITH GFR
Anion gap: 10 (ref 5–15)
BUN: 29 mg/dL — ABNORMAL HIGH (ref 8–23)
CO2: 24 mmol/L (ref 22–32)
Calcium: 10.4 mg/dL — ABNORMAL HIGH (ref 8.9–10.3)
Chloride: 104 mmol/L (ref 98–111)
Creatinine, Ser: 1.2 mg/dL (ref 0.61–1.24)
GFR, Estimated: >60 mL/min (ref >60)
Glucose, Bld: 139 mg/dL — ABNORMAL HIGH (ref 70–99)
Potassium: 4.4 mmol/L (ref 3.5–5.1)
Sodium: 137 mmol/L (ref 135–145)

## 2024-07-22 LAB — CBC
HCT: 33 % — ABNORMAL LOW (ref 39.0–52.0)
Hemoglobin: 10.5 g/dL — ABNORMAL LOW (ref 13.0–17.0)
MCH: 29.9 pg (ref 26.0–34.0)
MCHC: 31.8 g/dL (ref 30.0–36.0)
MCV: 94 fL (ref 80.0–100.0)
Platelets: 289 K/uL (ref 150–400)
RBC: 3.51 MIL/uL — ABNORMAL LOW (ref 4.22–5.81)
RDW: 13.2 % (ref 11.5–15.5)
WBC: 17.5 K/uL — ABNORMAL HIGH (ref 4.0–10.5)
nRBC: 0 % (ref 0.0–0.2)

## 2024-07-22 MED ORDER — PANTOPRAZOLE SODIUM 40 MG PO TBEC
80.0000 mg | DELAYED_RELEASE_TABLET | Freq: Every day | ORAL | Status: DC
  Administered 2024-07-22 – 2024-07-23 (×2): 80 mg via ORAL
  Filled 2024-07-22 (×2): qty 2

## 2024-07-22 MED ORDER — ORENCIA CLICKJECT 125 MG/ML ~~LOC~~ SOAJ
SUBCUTANEOUS | 4 refills | Status: AC
  Filled 2024-07-22: qty 4, 28d supply, fill #0
  Filled 2024-08-28 – 2024-09-30 (×2): qty 4, 28d supply, fill #1
  Filled 2024-10-27 – 2024-11-10 (×2): qty 4, 28d supply, fill #2

## 2024-07-22 MED ORDER — GUAIFENESIN ER 600 MG PO TB12
600.0000 mg | ORAL_TABLET | Freq: Two times a day (BID) | ORAL | Status: DC
  Administered 2024-07-22 – 2024-07-23 (×3): 600 mg via ORAL
  Filled 2024-07-22 (×3): qty 1

## 2024-07-22 MED ORDER — BUDESON-GLYCOPYRROL-FORMOTEROL 160-9-4.8 MCG/ACT IN AERO
2.0000 | INHALATION_SPRAY | Freq: Two times a day (BID) | RESPIRATORY_TRACT | Status: DC
  Administered 2024-07-22 – 2024-07-23 (×2): 2 via RESPIRATORY_TRACT
  Filled 2024-07-22: qty 5.9

## 2024-07-22 NOTE — Progress Notes (Signed)
 Specialty Pharmacy Refill Coordination Note  Todd Valdez is a 63 y.o. male contacted today regarding refills of specialty medication(s) Abatacept  (Orencia  [employer])   Patient requested Delivery   Delivery date: 08/01/24   Verified address: [address] [place]   Medication will be filled on 07/31/24.

## 2024-07-22 NOTE — Progress Notes (Addendum)
 PROGRESS NOTE    Todd Valdez  FMW:[id number] DOB: September 12, 1961 DOA: 07/21/2024 PCP: Alvia Bring, DO    Brief Narrative:   Todd Valdez is a 63 y.o. male with past medical history significant for rheumatoid arthritis on MTX/Orencia , bronchiectasis, possible underlying COPD, HLD who presented to [facility] ED on 07/21/2024 with complaints of cough, shortness of breath, pain to right side of his back, fever up to 102.0 F over the last 3 days.  In the ED, temperature 100.9 F, HR 127, RR 28, BP 97/62, SpO2 91% on 2 L nasal cannula, was placed on 8 L high flow nasal cannula due to increased work of breathing.  VBG with pH 7.431, pCO2 32.0, PO2 145.  WBC 21.2, hemoglobin 13.3, platelet count 383.  Sodium 137, potassium 4.6, chloride 102, CO2 20, glucose 123, BUN 32, creat 1.54.  AST 12, ALT 10, total bilirubin 0.7.  High-sensitivity troponin 23.  Lactic acid 0.8.  COVID/influenza/RSV PCR negative.  Urinalysis negative.  Blood cultures x 2 obtained.  Chest x-ray with low lung volumes with bibasilar atelectasis.  CT angiogram chest with no definite acute pulmonary embolism, right lower lobe collapse/consolidation, collapse/consolidation lingula and posterior left lower lobe, patchy/nodular ground glass opacities right upper lobe, findings consistent with multifocal pneumonia.  Patient was started on empiric antibiotics with azithromycin  and ceftriaxone .  Given IV morphine, Solu-Medrol , neb treatments.  TRH was consulted and patient was transferred to [facility] for further evaluation and management of severe sepsis secondary to multifocal pneumonia.  Assessment & Plan:   Severe sepsis, POA Multifocal pneumonia Patient presenting with 3-day history of progressive shortness of breath, cough, fever.  Patient with elevated temperature 100.9 F, tachycardic, tachypneic and hypoxic initially requiring high flow nasal cannula.  WBC elevated 21.2.  CT imaging findings concerning for multifocal  pneumonia.  Complicated by immunosuppression due to his rheumatoid arthritis with MTX and Orencia . -- WBC 21.2>17.5 -- Blood cultures x 2: No growth less than 24 hours -- Doxycycline  100 mg p.o. twice daily -- Ceftriaxone  1 g IV every 24 hours -- Incentive spirometer/flutter valve -- Continue supplemental oxygen, maintain SpO2 greater than 88%, currently weaned down to 4 L nasal, with SpO2 98% at rest  Acute renal failure: Improving Etiology likely secondary to dehydration versus ATN from severe sepsis as above.  Received IV fluid bolus/resuscitation. -- Cr 1.54>1.20 -- Continue to encourage increased oral intake -- BMP in am  History of bronchiectasis Concern for underlying COPD Follows with pulmonology outpatient, Dr. Kara.  Not oxygen dependent at baseline. -- Continue Breztri 2 puffs twice daily (substituted for home Trelegy Ellipta ) -- DuoNeb 4 times daily -- Albuterol  neb every 2 hours.  Wheezing/shortness of breath -- prednisone  40 mg PO daily  Rheumatoid arthritis Hx corneal ulcer -- Hold immunosuppressants with MTX/Orencia  given active infection as above -- Currently on Prednisone  40 mg PO daily ( 5 mg PO daily at baseline) -- On higher dose of doxycycline  as above (on 50 mg PO daily for corneal ulcer)  HLD -- Continue atorvastatin  40 mg p.o. daily   DVT prophylaxis: enoxaparin  (LOVENOX ) injection 40 mg Start: 07/21/24 1145    Code Status: Full Code Family Communication: No family present at bedside this morning  Disposition Plan:  Level of care: Stepdown Status is: Inpatient Remains inpatient appropriate because: IV antibiotics, weaning of supplemental oxygen    Consultants:  None  Procedures:  None  Antimicrobials:  Azithromycin  10/13 - 10/13 Doxycycline  10/13>> Ceftriaxone  10/13>>   Subjective: Patient seen examined  bedside, lying in bed.  Reports dyspnea much improved.  Oxygen now weaned down to 4 L nasal cannula.  Discussed with RN this morning.   Continues with mild cough, now afebrile.  Remains on IV antibiotics.  No other sputa questions, concerns or complaints at this time.  Denies headache, no dizziness, no chest pain, no palpitations, no abdominal pain, no fever/chills/night sweats, no nausea/vomiting/diarrhea, no focal weakness, no fatigue, no paresthesia.  No acute events overnight per nurse staff.  Objective: Vitals:   07/22/24 0757 07/22/24 0805 07/22/24 0843 07/22/24 0922  BP:      Pulse:      Resp:      Temp: 98.2 F (36.8 C) 98.2 F (36.8 C)    TempSrc: Oral Oral    SpO2:   96% 97%  Weight:      Height:        Intake/Output Summary (Last 24 hours) at 07/22/2024 0948 Last data filed at 07/22/2024 0640 Gross per 24 hour  Intake 4780.9 ml  Output 475 ml  Net 4305.9 ml   Filed Weights   07/21/24 0526 07/21/24 1030  Weight: 96.2 kg 83.3 kg    Examination:  Physical Exam: GEN: NAD, alert and oriented x 3, wd/wn HEENT: NCAT, PERRL, EOMI, sclera clear, MMM PULM: Breath sounds slightly diminished bilateral bases, mild late expiratory wheezing and crackles, normal respiratory effort without accessory muscle use, on 4 L nasal cannula with SpO2 97% at rest. CV: RRR w/o M/G/R GI: abd soft, NTND, + BS MSK: no peripheral edema, moves all EXTR independently NEURO: No focal neurological deficit PSYCH: normal mood/affect Integumentary: No concerning rashes/lesions/wounds noted on exposed skin surfaces    Data Reviewed: I have personally reviewed following labs and imaging studies  CBC: Recent Labs  Lab 07/21/24 0547 07/21/24 0610 07/22/24 0300  WBC 21.2*  --  17.5*  NEUTROABS 18.0*  --   --   HGB 13.3 13.6 10.5*  HCT 38.5* 40.0 33.0*  MCV 89.1  --  94.0  PLT 383  --  289   Basic Metabolic Panel: Recent Labs  Lab 07/21/24 0547 07/21/24 0610 07/22/24 0300  NA 137 135 137  K 4.6 4.6 4.4  CL 102  --  104  CO2 20*  --  24  GLUCOSE 123*  --  139*  BUN 32*  --  29*  CREATININE 1.54*  --  1.20   CALCIUM  10.6*  --  10.4*   GFR: Estimated Creatinine Clearance: 67.4 mL/min (by C-G formula based on SCr of 1.2 mg/dL). Liver Function Tests: Recent Labs  Lab 07/21/24 0547  AST 12*  ALT 10  ALKPHOS 80  BILITOT 0.7  PROT 7.5  ALBUMIN 3.9   No results for input(s): LIPASE, AMYLASE in the last 168 hours. No results for input(s): AMMONIA in the last 168 hours. Coagulation Profile: No results for input(s): INR, PROTIME in the last 168 hours. Cardiac Enzymes: No results for input(s): CKTOTAL, CKMB, CKMBINDEX, TROPONINI in the last 168 hours. BNP (last 3 results) No results for input(s): PROBNP in the last 8760 hours. HbA1C: No results for input(s): HGBA1C in the last 72 hours. CBG: No results for input(s): GLUCAP in the last 168 hours. Lipid Profile: No results for input(s): CHOL, HDL, LDLCALC, TRIG, CHOLHDL, LDLDIRECT in the last 72 hours. Thyroid  Function Tests: No results for input(s): TSH, T4TOTAL, FREET4, T3FREE, THYROIDAB in the last 72 hours. Anemia Panel: No results for input(s): VITAMINB12, FOLATE, FERRITIN, TIBC, IRON, RETICCTPCT in the last 72  hours. Sepsis Labs: Recent Labs  Lab 07/21/24 0547  LATICACIDVEN 0.8    Recent Results (from the past 240 hours)  Blood culture (routine x 2)     Status: None (Preliminary result)   Collection Time: 07/21/24  5:35 AM   Specimen: BLOOD LEFT FOREARM  Result Value Ref Range Status   Specimen Description   Final    BLOOD LEFT FOREARM Performed at [facility], [address] [place], [place] [address]    Special Requests   Final    BOTTLES DRAWN AEROBIC AND ANAEROBIC Blood Culture adequate volume Performed at [facility], [address] [place], [place] [address]    Culture   Final    NO GROWTH < 24 HOURS Performed at [facility], [address] [place], [place] [address]    Report Status PENDING  Incomplete  Resp panel by  RT-PCR (RSV, Flu A&B, Covid) Anterior Nasal Swab     Status: None   Collection Time: 07/21/24  5:47 AM   Specimen: Anterior Nasal Swab  Result Value Ref Range Status   SARS Coronavirus 2 by RT PCR NEGATIVE NEGATIVE Final    Comment: (NOTE) SARS-CoV-2 target nucleic acids are NOT DETECTED.  The SARS-CoV-2 RNA is generally detectable in upper respiratory specimens during the acute phase of infection. The lowest concentration of SARS-CoV-2 viral copies this assay can detect is 138 copies/mL. A negative result does not preclude SARS-Cov-2 infection and should not be used as the sole basis for treatment or other patient management decisions. A negative result may occur with  improper specimen collection/handling, submission of specimen other than nasopharyngeal swab, presence of viral mutation(s) within the areas targeted by this assay, and inadequate number of viral copies(<138 copies/mL). A negative result must be combined with clinical observations, patient history, and epidemiological information. The expected result is Negative.  Fact Sheet for Patients:  BloggerCourse.com  Fact Sheet for Healthcare Providers:  SeriousBroker.it  This test is no t yet approved or cleared by the [place]  FDA and  has been authorized for detection and/or diagnosis of SARS-CoV-2 by FDA under an Emergency Use Authorization (EUA). This EUA will remain  in effect (meaning this test can be used) for the duration of the COVID-19 declaration under Section 564(b)(1) of the Act, 21 U.S.C.section 360bbb-3(b)(1), unless the authorization is terminated  or revoked sooner.       Influenza A by PCR NEGATIVE NEGATIVE Final   Influenza B by PCR NEGATIVE NEGATIVE Final    Comment: (NOTE) The Xpert Xpress SARS-CoV-2/FLU/RSV plus assay is intended as an aid in the diagnosis of influenza from Nasopharyngeal swab specimens and should not be used as a sole basis  for treatment. Nasal washings and aspirates are unacceptable for Xpert Xpress SARS-CoV-2/FLU/RSV testing.  Fact Sheet for Patients: BloggerCourse.com  Fact Sheet for Healthcare Providers: SeriousBroker.it  This test is not yet approved or cleared by the [place]  FDA and has been authorized for detection and/or diagnosis of SARS-CoV-2 by FDA under an Emergency Use Authorization (EUA). This EUA will remain in effect (meaning this test can be used) for the duration of the COVID-19 declaration under Section 564(b)(1) of the Act, 21 U.S.C. section 360bbb-3(b)(1), unless the authorization is terminated or revoked.     Resp Syncytial Virus by PCR NEGATIVE NEGATIVE Final    Comment: (NOTE) Fact Sheet for Patients: BloggerCourse.com  Fact Sheet for Healthcare Providers: SeriousBroker.it  This test is not yet approved or cleared  by the [place]  FDA and has been authorized for detection and/or diagnosis of SARS-CoV-2 by FDA under an Emergency Use Authorization (EUA). This EUA will remain in effect (meaning this test can be used) for the duration of the COVID-19 declaration under Section 564(b)(1) of the Act, 21 U.S.C. section 360bbb-3(b)(1), unless the authorization is terminated or revoked.  Performed at [facility], [address] [place], [place] [address]   Blood culture (routine x 2)     Status: None (Preliminary result)   Collection Time: 07/21/24  6:05 AM   Specimen: BLOOD LEFT HAND  Result Value Ref Range Status   Specimen Description   Final    BLOOD LEFT HAND Performed at [facility], [address] [place], [place] [address]    Special Requests   Final    BOTTLES DRAWN AEROBIC AND ANAEROBIC Blood Culture results may not be optimal due to an inadequate volume of blood received in culture bottles Performed at [facility], [address] [place], [place] [address]    Culture   Final    NO GROWTH < 24 HOURS Performed at [facility], [address], [place], [place] [address]    Report Status PENDING  Incomplete  MRSA Next Gen by PCR, Nasal     Status: None   Collection Time: 07/21/24 10:33 AM   Specimen: Nasal Mucosa; Nasal Swab  Result Value Ref Range Status   MRSA by PCR Next Gen NOT DETECTED NOT DETECTED Final    Comment: (NOTE) The GeneXpert MRSA Assay (FDA approved for NASAL specimens only), is one component of a comprehensive MRSA colonization surveillance program. It is not intended to diagnose MRSA infection nor to guide or monitor treatment for MRSA infections. Test performance is not FDA approved in patients less than 68 years old. Performed at [facility], [address], [place], [place] [address]          Radiology Studies: CT Angio Chest Pulmonary Embolism (PE) W or WO Contrast Result Date: 07/21/2024 CLINICAL DATA:  3 day history of cough and right-sided back pain. Clinical concern for pulmonary embolus. EXAM: CT ANGIOGRAPHY CHEST WITH CONTRAST TECHNIQUE: Multidetector CT imaging of the chest was performed using the standard protocol during bolus administration of intravenous contrast. Multiplanar CT image reconstructions and MIPs were obtained to evaluate the vascular anatomy. RADIATION DOSE REDUCTION: This exam was performed according to the departmental dose-optimization program which includes automated exposure control, adjustment of the mA and/or kV according to patient size and/or use of iterative reconstruction technique. CONTRAST:  75mL OMNIPAQUE  IOHEXOL  350 MG/ML SOLN COMPARISON:  02/18/2024 FINDINGS: Cardiovascular: The heart size is normal. No substantial pericardial effusion. Mild atherosclerotic calcification is noted in the wall of the thoracic aorta. Image quality degraded by patient breathing motion during image acquisition. Within this limitation, no  definite acute pulmonary embolus. Mediastinum/Nodes: No mediastinal lymphadenopathy. There is no hilar lymphadenopathy. Moderate hiatal hernia. The esophagus has normal imaging features. There is no axillary lymphadenopathy. Lungs/Pleura: Right lower lobe collapse/consolidation adjacent 2 asymmetric elevation of the right hemidiaphragm. Collapse/consolidative opacity seen in the lingula and posterior left lower lobe. Patchy/nodular ground-glass opacity noted right upper lobe. No substantial pleural effusion. Upper Abdomen: Visualized portion of the upper abdomen shows no acute findings. Musculoskeletal: No worrisome lytic or sclerotic osseous abnormality. Review of the MIP images confirms the above findings. IMPRESSION: 1. Image quality degraded by patient breathing motion during image acquisition. Within  this limitation, no definite acute pulmonary embolus. 2. Right lower lobe collapse/consolidation adjacent to asymmetric elevation of the right hemidiaphragm. Collapse/consolidative opacity in the lingula and posterior left lower lobe. Imaging features are compatible with multifocal pneumonia. 3. Patchy/nodular ground-glass opacity right upper lobe, likely infectious/inflammatory. 4. Moderate hiatal hernia. 5.  Aortic Atherosclerosis (ICD10-I70.0). Electronically Signed   By: Camellia Candle M.D.   On: 07/21/2024 07:35   DG Chest 2 View Result Date: 07/21/2024 CLINICAL DATA:  Cough, fever, and shortness of breath for several days. EXAM: CHEST - 2 VIEW COMPARISON:  11/01/2023 FINDINGS: Very low lung volumes are seen, with increased bibasilar atelectasis. No pneumothorax or pleural effusion identified. IMPRESSION: Very low lung volumes with bibasilar atelectasis. Electronically Signed   By: Norleen DELENA Kil M.D.   On: 07/21/2024 06:53        Scheduled Meds:  atorvastatin   40 mg Oral Daily   budesonide-glycopyrrolate-formoterol  2 puff Inhalation BID   Chlorhexidine  Gluconate Cloth  6 each Topical Daily    doxycycline   100 mg Oral BID   Followed by   NOREEN ON 07/26/2024] doxycycline   50 mg Oral Q0600   enoxaparin  (LOVENOX ) injection  40 mg Subcutaneous Q24H   folic acid   1 mg Oral Daily   ipratropium-albuterol   3 mL Nebulization QID   pantoprazole   80 mg Oral Q1200   predniSONE   40 mg Oral Q breakfast   Continuous Infusions:  cefTRIAXone  (ROCEPHIN )  IV Stopped (07/22/24 0640)   lactated ringers        LOS: 1 day    Time spent: 52 minutes spent on 07/22/2024 caring for this patient face-to-face including chart review, ordering labs/tests, documenting, discussion with nursing staff, consultants, updating family and interview/physical exam    Camellia PARAS [place], DO [facility] Available via Epic secure chat 7am-7pm After these hours, please refer to coverage provider listed on [email or web address] 07/22/2024, 9:48 AM

## 2024-07-22 NOTE — Plan of Care (Signed)
  Problem: Elimination: Goal: Will not experience complications related to bowel motility Outcome: Progressing Goal: Will not experience complications related to urinary retention Outcome: Progressing   Problem: Clinical Measurements: Goal: Will remain free from infection Outcome: Not Progressing Goal: Diagnostic test results will improve Outcome: Not Progressing Goal: Respiratory complications will improve Outcome: Not Progressing   Problem: Pain Managment: Goal: General experience of comfort will improve and/or be controlled Outcome: Not Progressing

## 2024-07-23 ENCOUNTER — Inpatient Hospital Stay (HOSPITAL_COMMUNITY)

## 2024-07-23 ENCOUNTER — Other Ambulatory Visit (HOSPITAL_COMMUNITY): Payer: Self-pay

## 2024-07-23 ENCOUNTER — Other Ambulatory Visit (HOSPITAL_BASED_OUTPATIENT_CLINIC_OR_DEPARTMENT_OTHER): Payer: Self-pay

## 2024-07-23 DIAGNOSIS — R918 Other nonspecific abnormal finding of lung field: Secondary | ICD-10-CM | POA: Diagnosis not present

## 2024-07-23 DIAGNOSIS — J90 Pleural effusion, not elsewhere classified: Secondary | ICD-10-CM | POA: Diagnosis not present

## 2024-07-23 DIAGNOSIS — J188 Other pneumonia, unspecified organism: Secondary | ICD-10-CM | POA: Diagnosis not present

## 2024-07-23 DIAGNOSIS — J9811 Atelectasis: Secondary | ICD-10-CM | POA: Diagnosis not present

## 2024-07-23 DIAGNOSIS — R0989 Other specified symptoms and signs involving the circulatory and respiratory systems: Secondary | ICD-10-CM | POA: Diagnosis not present

## 2024-07-23 LAB — CBC
HCT: 34.1 % — ABNORMAL LOW (ref 39.0–52.0)
Hemoglobin: 11 g/dL — ABNORMAL LOW (ref 13.0–17.0)
MCH: 29.8 pg (ref 26.0–34.0)
MCHC: 32.3 g/dL (ref 30.0–36.0)
MCV: 92.4 fL (ref 80.0–100.0)
Platelets: 394 K/uL (ref 150–400)
RBC: 3.69 MIL/uL — ABNORMAL LOW (ref 4.22–5.81)
RDW: 13.2 % (ref 11.5–15.5)
WBC: 16.4 K/uL — ABNORMAL HIGH (ref 4.0–10.5)
nRBC: 0 % (ref 0.0–0.2)

## 2024-07-23 LAB — BASIC METABOLIC PANEL WITH GFR
Anion gap: 12 (ref 5–15)
BUN: 26 mg/dL — ABNORMAL HIGH (ref 8–23)
CO2: 23 mmol/L (ref 22–32)
Calcium: 10.5 mg/dL — ABNORMAL HIGH (ref 8.9–10.3)
Chloride: 107 mmol/L (ref 98–111)
Creatinine, Ser: 1.05 mg/dL (ref 0.61–1.24)
GFR, Estimated: >60 mL/min (ref >60)
Glucose, Bld: 91 mg/dL (ref 70–99)
Potassium: 4 mmol/L (ref 3.5–5.1)
Sodium: 142 mmol/L (ref 135–145)

## 2024-07-23 MED ORDER — GUAIFENESIN ER 600 MG PO TB12
600.0000 mg | ORAL_TABLET | Freq: Two times a day (BID) | ORAL | 0 refills | Status: AC
  Filled 2024-07-23: qty 20, 10d supply, fill #0

## 2024-07-23 MED ORDER — CEFADROXIL 500 MG PO CAPS: 1000.0000 mg | ORAL_CAPSULE | Freq: Two times a day (BID) | ORAL | Status: DC

## 2024-07-23 MED ORDER — PREDNISONE 5 MG PO TABS
ORAL_TABLET | ORAL | 0 refills | Status: DC
  Filled 2024-07-23: qty 90, 42d supply, fill #0

## 2024-07-23 MED ORDER — DOXYCYCLINE HYCLATE 100 MG PO TABS
ORAL_TABLET | ORAL | 0 refills | Status: AC
  Filled 2024-07-23: qty 36, 33d supply, fill #0

## 2024-07-23 MED ORDER — IPRATROPIUM-ALBUTEROL 0.5-2.5 (3) MG/3ML IN SOLN
3.0000 mL | Freq: Two times a day (BID) | RESPIRATORY_TRACT | Status: DC
  Administered 2024-07-23: 3 mL via RESPIRATORY_TRACT
  Filled 2024-07-23: qty 3

## 2024-07-23 MED ORDER — SODIUM CHLORIDE 0.9 % IV SOLN: INTRAVENOUS | Status: DC

## 2024-07-23 MED ORDER — LIDOCAINE 5 % EX PTCH
1.0000 | MEDICATED_PATCH | CUTANEOUS | Status: DC
  Administered 2024-07-23: 1 via TRANSDERMAL
  Filled 2024-07-23: qty 1

## 2024-07-23 MED ORDER — CEFADROXIL 500 MG PO CAPS
1000.0000 mg | ORAL_CAPSULE | Freq: Two times a day (BID) | ORAL | 0 refills | Status: AC
  Filled 2024-07-23: qty 12, 3d supply, fill #0

## 2024-07-23 MED ORDER — PANTOPRAZOLE SODIUM 40 MG PO TBEC: 40.0000 mg | DELAYED_RELEASE_TABLET | Freq: Every day | ORAL | Status: DC

## 2024-07-23 NOTE — Final Progress Note (Signed)
 AVS reviewed with patient. Ivs removed. Incentive spirometer provided.

## 2024-07-23 NOTE — Hospital Course (Addendum)
 Todd Valdez is a 63 y.o. male with past medical history significant for rheumatoid arthritis on MTX/Orencia , bronchiectasis, possible underlying COPD, HLD who presented to [facility] ED on 07/21/2024 with complaints of cough, shortness of breath, pain to right side of his back, fever up to 102.0 F over the last 3 days.   In the ED, temperature 100.9 F, HR 127, RR 28, BP 97/62, SpO2 91% on 2 L nasal cannula, was placed on 8 L high flow nasal cannula due to increased work of breathing.  VBG with pH 7.431, pCO2 32.0, PO2 145.  WBC 21.2, hemoglobin 13.3, platelet count 383.  Sodium 137, potassium 4.6, chloride 102, CO2 20, glucose 123, BUN 32, creat 1.54.  AST 12, ALT 10, total bilirubin 0.7.  High-sensitivity troponin 23.  Lactic acid 0.8.  COVID/influenza/RSV PCR negative.  Urinalysis negative.  Blood cultures x 2 obtained.  Chest x-ray with low lung volumes with bibasilar atelectasis.  CT angiogram chest with no definite acute pulmonary embolism, right lower lobe collapse/consolidation, collapse/consolidation lingula and posterior left lower lobe, patchy/nodular ground glass opacities right upper lobe, findings consistent with multifocal pneumonia.  Patient was started on empiric antibiotics with azithromycin  and ceftriaxone .  Given IV morphine, Solu-Medrol , neb treatments.  TRH was consulted and patient was transferred to [facility] for further evaluation and management of severe sepsis secondary to multifocal pneumonia.  He was treated and steadily improved and transition to oral steroids and antibiotics.  He was repeat chest x-ray in 3 to 6 weeks and follow-up with PCP, pulmonary and rheumatology outpatient setting.  Prior to discharge she ambulated and did not desaturate and is medically stable to be seen in outpatient setting.  Assessment and Plan:  Severe sepsis, POA, improved Multifocal pneumonia, improving Patient presenting with 3-day history of progressive shortness of breath,  cough, fever.  Patient with elevated temperature 100.9 F, tachycardic, tachypneic and hypoxic initially requiring high flow nasal cannula.  WBC elevated 21.2.  CT imaging findings concerning for multifocal pneumonia.  Complicated by immunosuppression due to his rheumatoid arthritis with MTX and Orencia . -- WBC 21.2 -> 17.5 -> 16.4 -- Blood cultures x 2: No growth @ 4 Days -- Doxycycline  100 mg p.o. twice daily and will continue and then reduce to 50 mg po Daily once stable -- Ceftriaxone  1 g IV every 24 hours changed to po -- Incentive spirometer/flutter valve -- Continue supplemental oxygen, maintain SpO2 greater than 88%, currently weaned down to 4 L nasal, with SpO2 98% at rest; Ambulatory Home O2 screen done and patient did not desaturate prior to D/C -Repeat CXR done and showed Increasing opacity at the lateral left lung base, suspicious for pneumonia versus atelectasis. There was also a  Small right pleural effusion with adjacent atelectasis but patient is stable and improved clinically     Acute renal failure: Improving Etiology likely secondary to dehydration versus ATN from severe sepsis as above.  Received IV fluid bolus/resuscitation. BUN/Cr Trend: Recent Labs  Lab 07/21/24 0547 07/22/24 0300 07/23/24 0442  BUN 32* 29* 26*  CREATININE 1.54* 1.20 1.05  -Avoid Nephrotoxic Medications, Contrast Dyes, Hypotension and Dehydration to Ensure Adequate Renal Perfusion and will need to Renally Adjust Meds -Continue to Monitor and Trend Renal Function carefully and repeat CMP w/in 1 week    History of bronchiectasis Concern for underlying COPD Follows with pulmonology outpatient, Dr. Kara.  Not oxygen dependent at baseline. -- Continue Breztri 2 puffs twice daily (substituted for home Trelegy Ellipta ) -- DuoNeb 4 times  daily -- Albuterol  neb every 2 hours.  Wheezing/shortness of breath -- prednisone  40 mg PO daily and Taper Back to Home 5 mg -F/U w/ Pulmonary Dr. Kara w/in 1-2 weeks    Rheumatoid arthritis Hx corneal ulcer -- Hold immunosuppressants with MTX/Orencia  given active infection as above -- Currently on Prednisone  40 mg PO daily ( 5 mg PO daily at baseline) -- On higher dose of doxycycline  as above (on 50 mg PO daily for corneal ulcer)  Hypercalcemia: Mild Ca2+ went from 10.6 -> 10.4 -> 10.5. IVF as above. CTM and Trend and repeat CMP w/in 1 week   HLD: Continue Atorvastatin  40 mg p.o. daily  Normocytic Anemia: Hgb/Hct Trend:  Recent Labs  Lab 07/21/24 0547 07/21/24 0610 07/22/24 0300 07/23/24 0442  HGB 13.3 13.6 10.5* 11.0*  HCT 38.5* 40.0 33.0* 34.1*  MCV 89.1  --  94.0 92.4  -Check Anemia Panel in the outpatient setting; CTM for S/Sx of Bleeding; No overt bleeding noted. Repeat CBC w/in 1 week

## 2024-07-23 NOTE — Discharge Summary (Signed)
 Physician Discharge Summary   Patient: Todd Valdez MRN: [id number] DOB: Apr 29, 1961  Admit date:     07/21/2024  Discharge date: 07/23/24  Discharge Physician: Alejandro Marker, DO   PCP: Alvia Bring, DO   Recommendations at discharge:   Follow-up with PCP within 1 to 2 weeks repeat CBC, CMP, mag, Phos within 1 week Repeat chest x-ray in 3 to 6 weeks Follow-up with Rheumatology in outpatient setting within 1 to 2 weeks Follow-up with Pulmonary outpatient setting within 1 to 2 weeks  Discharge Diagnoses: Principal Problem:   Multifocal pneumonia  Resolved Problems:   * No resolved hospital problems. *  Hospital Course: Todd Valdez is a 63 y.o. male with past medical history significant for rheumatoid arthritis on MTX/Orencia , bronchiectasis, possible underlying COPD, HLD who presented to [facility] ED on 07/21/2024 with complaints of cough, shortness of breath, pain to right side of his back, fever up to 102.0 F over the last 3 days.   In the ED, temperature 100.9 F, HR 127, RR 28, BP 97/62, SpO2 91% on 2 L nasal cannula, was placed on 8 L high flow nasal cannula due to increased work of breathing.  VBG with pH 7.431, pCO2 32.0, PO2 145.  WBC 21.2, hemoglobin 13.3, platelet count 383.  Sodium 137, potassium 4.6, chloride 102, CO2 20, glucose 123, BUN 32, creat 1.54.  AST 12, ALT 10, total bilirubin 0.7.  High-sensitivity troponin 23.  Lactic acid 0.8.  COVID/influenza/RSV PCR negative.  Urinalysis negative.  Blood cultures x 2 obtained.  Chest x-ray with low lung volumes with bibasilar atelectasis.  CT angiogram chest with no definite acute pulmonary embolism, right lower lobe collapse/consolidation, collapse/consolidation lingula and posterior left lower lobe, patchy/nodular ground glass opacities right upper lobe, findings consistent with multifocal pneumonia.  Patient was started on empiric antibiotics with azithromycin  and ceftriaxone .  Given IV morphine, Solu-Medrol , neb  treatments.  TRH was consulted and patient was transferred to [facility] for further evaluation and management of severe sepsis secondary to multifocal pneumonia.  He was treated and steadily improved and transition to oral steroids and antibiotics.  He was repeat chest x-ray in 3 to 6 weeks and follow-up with PCP, pulmonary and rheumatology outpatient setting.  Prior to discharge she ambulated and did not desaturate and is medically stable to be seen in outpatient setting.  Assessment and Plan:  Severe sepsis, POA, improved Multifocal pneumonia, improving Patient presenting with 3-day history of progressive shortness of breath, cough, fever.  Patient with elevated temperature 100.9 F, tachycardic, tachypneic and hypoxic initially requiring high flow nasal cannula.  WBC elevated 21.2.  CT imaging findings concerning for multifocal pneumonia.  Complicated by immunosuppression due to his rheumatoid arthritis with MTX and Orencia . -- WBC 21.2 -> 17.5 -> 16.4 -- Blood cultures x 2: No growth @ 4 Days -- Doxycycline  100 mg p.o. twice daily and will continue and then reduce to 50 mg po Daily once stable -- Ceftriaxone  1 g IV every 24 hours changed to po -- Incentive spirometer/flutter valve -- Continue supplemental oxygen, maintain SpO2 greater than 88%, currently weaned down to 4 L nasal, with SpO2 98% at rest; Ambulatory Home O2 screen done and patient did not desaturate prior to D/C -Repeat CXR done and showed Increasing opacity at the lateral left lung base, suspicious for pneumonia versus atelectasis. There was also a  Small right pleural effusion with adjacent atelectasis but patient is stable and improved clinically     Acute renal failure: Improving Etiology  likely secondary to dehydration versus ATN from severe sepsis as above.  Received IV fluid bolus/resuscitation. BUN/Cr Trend: Recent Labs  Lab 07/21/24 0547 07/22/24 0300 07/23/24 0442  BUN 32* 29* 26*  CREATININE 1.54* 1.20  1.05  -Avoid Nephrotoxic Medications, Contrast Dyes, Hypotension and Dehydration to Ensure Adequate Renal Perfusion and will need to Renally Adjust Meds -Continue to Monitor and Trend Renal Function carefully and repeat CMP w/in 1 week    History of bronchiectasis Concern for underlying COPD Follows with pulmonology outpatient, Dr. Kara.  Not oxygen dependent at baseline. -- Continue Breztri 2 puffs twice daily (substituted for home Trelegy Ellipta ) -- DuoNeb 4 times daily -- Albuterol  neb every 2 hours.  Wheezing/shortness of breath -- prednisone  40 mg PO daily and Taper Back to Home 5 mg -F/U w/ Pulmonary Dr. Kara w/in 1-2 weeks   Rheumatoid arthritis Hx corneal ulcer -- Hold immunosuppressants with MTX/Orencia  given active infection as above -- Currently on Prednisone  40 mg PO daily ( 5 mg PO daily at baseline) -- On higher dose of doxycycline  as above (on 50 mg PO daily for corneal ulcer)  Hypercalcemia: Mild Ca2+ went from 10.6 -> 10.4 -> 10.5. IVF as above. CTM and Trend and repeat CMP w/in 1 week   HLD: Continue Atorvastatin  40 mg p.o. daily  Normocytic Anemia: Hgb/Hct Trend:  Recent Labs  Lab 07/21/24 0547 07/21/24 0610 07/22/24 0300 07/23/24 0442  HGB 13.3 13.6 10.5* 11.0*  HCT 38.5* 40.0 33.0* 34.1*  MCV 89.1  --  94.0 92.4  -Check Anemia Panel in the outpatient setting; CTM for S/Sx of Bleeding; No overt bleeding noted. Repeat CBC w/in 1 week    Consultants: None Procedures performed: As delineated as above    Disposition: Home  Diet recommendation:  Cardiac diet  DISCHARGE MEDICATION: Allergies as of 07/23/2024   No Known Allergies      Medication List     PAUSE taking these medications    methotrexate  250 MG/10ML injection Wait to take this until your doctor or other care provider tells you to start again. Inject 1 mL (25 mg total) into the muscle once a week.       STOP taking these medications    doxycycline  50 MG tablet Commonly  known as: ADOXA       TAKE these medications    acetaminophen  325 MG tablet Commonly known as: TYLENOL  Take 650 mg by mouth daily as needed for mild pain (pain score 1-3) or moderate pain (pain score 4-6).   albuterol  108 (90 Base) MCG/ACT inhaler Commonly known as: VENTOLIN  HFA Inhale 2 puffs into the lungs every 6 (six) hours as needed for wheezing or shortness of breath.   atorvastatin  40 MG tablet Commonly known as: LIPITOR Take 40 mg by mouth daily.   cefadroxil 500 MG capsule Commonly known as: DURICEF Take 2 capsules (1,000 mg total) by mouth 2 (two) times daily for 3 days.   doxycycline  100 MG tablet Commonly known as: VIBRA -TABS Take 1 tablet (100 mg total) by mouth 2 (two) times daily for 3 days, THEN 0.5 tablets (50 mg total) 2 (two) times daily. Start taking on: July 23, 2024   esomeprazole  40 MG capsule Commonly known as: NEXIUM  Take 1 capsule (40 mg total) by mouth daily at 12 noon. What changed: when to take this   folic acid  1 MG tablet Commonly known as: FOLVITE  Take 1 tablet (1 mg total) by mouth daily.   guaiFENesin  600 MG 12 hr tablet Commonly  known as: MUCINEX  Take 1 tablet (600 mg total) by mouth 2 (two) times daily for 5 days.   lisinopril -hydrochlorothiazide  20-25 MG tablet Commonly known as: ZESTORETIC  Take 1 tablet by mouth daily.   niacin  500 MG tablet Commonly known as: (VITAMIN B3) Take 1 tablet (500 mg total) by mouth at bedtime. What changed: when to take this   ondansetron  8 MG disintegrating tablet Commonly known as: ZOFRAN -ODT Take 1 tablet (8 mg total) by mouth every 8 (eight) hours as needed for nausea.   Orencia  ClickJect 125 MG/ML Soaj Generic drug: Abatacept  Inject 125mg  Subcutaneous once a week What changed:  how much to take how to take this when to take this   predniSONE  5 MG tablet Commonly known as: DELTASONE  Take 8 tablets (40 mg total) by mouth daily with breakfast for 3 days, THEN 6 tablets (30 mg total)  daily with breakfast for 3 days, THEN 4 tablets (20 mg total) daily with breakfast for 3 days, THEN 2 tablets (10 mg total) daily with breakfast for 3 days, THEN 1 tablet (5 mg total) daily with breakfast. Start taking on: July 24, 2024 What changed: See the new instructions.   Repatha  SureClick 140 MG/ML Soaj Generic drug: Evolocumab  Inject 140 mg into the skin every 14 (fourteen) days.   sodium chloride  HYPERTONIC 3 % nebulizer solution Take by nebulization 2 (two) times daily as needed. What changed:  how much to take when to take this reasons to take this   Trelegy Ellipta  200-62.5-25 MCG/ACT Aepb Generic drug: Fluticasone -Umeclidin-Vilant Inhale 1 puff by mouth into the lungs daily.       Discharge Exam: Filed Weights   07/21/24 0526 07/21/24 1030  Weight: 96.2 kg 83.3 kg   Vitals:   07/23/24 1136 07/23/24 1152  BP: 121/73   Pulse: 89   Resp: 18   Temp: 98.2 F (36.8 C)   SpO2: 94% 91%   Examination: Physical Exam:  Constitutional: WN/WD Caucasian male in no acute distress Respiratory: Diminished to auscultation bilaterally with coarse breath sounds and some slight rhonchi but no appreciable wheezing or rales.  No crackles noted.  Has a normal respiratory effort is not tachypneic or using accessory muscles to breathe.  Not wearing supplemental oxygen via nasal cannula Cardiovascular: RRR, no murmurs / rubs / gallops. S1 and S2 auscultated. No extremity edema. Abdomen: Soft, non-tender, non-distended. Bowel sounds positive.  GU: Deferred. Musculoskeletal: Has hand arthritic changes Skin: No rashes, lesions, ulcers on limited skin evaluation. No induration; Warm and dry.  Neurologic: CN 2-12 grossly intact with no focal deficits. Romberg sign and cerebellar reflexes not assessed.  Psychiatric: Normal judgment and insight. Alert and oriented x 3. Normal mood and appropriate affect.   Condition at discharge: stable  The results of significant diagnostics from  this hospitalization (including imaging, microbiology, ancillary and laboratory) are listed below for reference.   Imaging Studies: DG CHEST PORT 1 VIEW Result Date: 07/23/2024 EXAM: 1 VIEW(S) XRAY OF THE CHEST 07/23/2024 11:25:00 AM COMPARISON: 07/21/2024 CLINICAL HISTORY: SOB (shortness of breath) [id number]; [id number] Pneumonia [id number]. SOB, pneumonia FINDINGS: LUNGS AND PLEURA: Low lung volumes accentuate pulmonary vascularity. Increasing opacity at lateral left lung base. Small right pleural effusion with adjacent atelectasis. No pulmonary edema. No pneumothorax. HEART AND MEDIASTINUM: Low lung volumes accentuate cardiomediastinal silhouette. BONES AND SOFT TISSUES: No acute osseous abnormality. IMPRESSION: 1. Increasing opacity at the lateral left lung base, suspicious for pneumonia versus atelectasis. 2. Small right pleural effusion with adjacent atelectasis. Electronically signed by: Donnice  Hunt MD 07/23/2024 03:11 PM EDT RP Workstation: [id number]   CT Angio Chest Pulmonary Embolism (PE) W or WO Contrast Result Date: 07/21/2024 CLINICAL DATA:  3 day history of cough and right-sided back pain. Clinical concern for pulmonary embolus. EXAM: CT ANGIOGRAPHY CHEST WITH CONTRAST TECHNIQUE: Multidetector CT imaging of the chest was performed using the standard protocol during bolus administration of intravenous contrast. Multiplanar CT image reconstructions and MIPs were obtained to evaluate the vascular anatomy. RADIATION DOSE REDUCTION: This exam was performed according to the departmental dose-optimization program which includes automated exposure control, adjustment of the mA and/or kV according to patient size and/or use of iterative reconstruction technique. CONTRAST:  75mL OMNIPAQUE  IOHEXOL  350 MG/ML SOLN COMPARISON:  02/18/2024 FINDINGS: Cardiovascular: The heart size is normal. No substantial pericardial effusion. Mild atherosclerotic calcification is noted in the wall of the thoracic aorta. Image  quality degraded by patient breathing motion during image acquisition. Within this limitation, no definite acute pulmonary embolus. Mediastinum/Nodes: No mediastinal lymphadenopathy. There is no hilar lymphadenopathy. Moderate hiatal hernia. The esophagus has normal imaging features. There is no axillary lymphadenopathy. Lungs/Pleura: Right lower lobe collapse/consolidation adjacent 2 asymmetric elevation of the right hemidiaphragm. Collapse/consolidative opacity seen in the lingula and posterior left lower lobe. Patchy/nodular ground-glass opacity noted right upper lobe. No substantial pleural effusion. Upper Abdomen: Visualized portion of the upper abdomen shows no acute findings. Musculoskeletal: No worrisome lytic or sclerotic osseous abnormality. Review of the MIP images confirms the above findings. IMPRESSION: 1. Image quality degraded by patient breathing motion during image acquisition. Within this limitation, no definite acute pulmonary embolus. 2. Right lower lobe collapse/consolidation adjacent to asymmetric elevation of the right hemidiaphragm. Collapse/consolidative opacity in the lingula and posterior left lower lobe. Imaging features are compatible with multifocal pneumonia. 3. Patchy/nodular ground-glass opacity right upper lobe, likely infectious/inflammatory. 4. Moderate hiatal hernia. 5.  Aortic Atherosclerosis (ICD10-I70.0). Electronically Signed   By: Camellia Candle M.D.   On: 07/21/2024 07:35   DG Chest 2 View Result Date: 07/21/2024 CLINICAL DATA:  Cough, fever, and shortness of breath for several days. EXAM: CHEST - 2 VIEW COMPARISON:  11/01/2023 FINDINGS: Very low lung volumes are seen, with increased bibasilar atelectasis. No pneumothorax or pleural effusion identified. IMPRESSION: Very low lung volumes with bibasilar atelectasis. Electronically Signed   By: Norleen DELENA Kil M.D.   On: 07/21/2024 06:53   Microbiology: Results for orders placed or performed during the hospital encounter of  07/21/24  Blood culture (routine x 2)     Status: None (Preliminary result)   Collection Time: 07/21/24  5:35 AM   Specimen: BLOOD LEFT FOREARM  Result Value Ref Range Status   Specimen Description   Final    BLOOD LEFT FOREARM Performed at [facility], [address] [place], [place] [address]    Special Requests   Final    BOTTLES DRAWN AEROBIC AND ANAEROBIC Blood Culture adequate volume Performed at [facility], [address] [place], [place] [address]    Culture   Final    NO GROWTH 4 DAYS Performed at [facility], [address] [place], [place] [address]    Report Status PENDING  Incomplete  Resp panel by RT-PCR (RSV, Flu A&B, Covid) Anterior Nasal Swab     Status: None   Collection Time: 07/21/24  5:47 AM   Specimen: Anterior Nasal Swab  Result Value Ref Range Status   SARS Coronavirus 2 by RT PCR NEGATIVE NEGATIVE Final  Comment: (NOTE) SARS-CoV-2 target nucleic acids are NOT DETECTED.  The SARS-CoV-2 RNA is generally detectable in upper respiratory specimens during the acute phase of infection. The lowest concentration of SARS-CoV-2 viral copies this assay can detect is 138 copies/mL. A negative result does not preclude SARS-Cov-2 infection and should not be used as the sole basis for treatment or other patient management decisions. A negative result may occur with  improper specimen collection/handling, submission of specimen other than nasopharyngeal swab, presence of viral mutation(s) within the areas targeted by this assay, and inadequate number of viral copies(<138 copies/mL). A negative result must be combined with clinical observations, patient history, and epidemiological information. The expected result is Negative.  Fact Sheet for Patients:  BloggerCourse.com  Fact Sheet for Healthcare Providers:  SeriousBroker.it  This test is no t yet approved or cleared by the  [place]  FDA and  has been authorized for detection and/or diagnosis of SARS-CoV-2 by FDA under an Emergency Use Authorization (EUA). This EUA will remain  in effect (meaning this test can be used) for the duration of the COVID-19 declaration under Section 564(b)(1) of the Act, 21 U.S.C.section 360bbb-3(b)(1), unless the authorization is terminated  or revoked sooner.       Influenza A by PCR NEGATIVE NEGATIVE Final   Influenza B by PCR NEGATIVE NEGATIVE Final    Comment: (NOTE) The Xpert Xpress SARS-CoV-2/FLU/RSV plus assay is intended as an aid in the diagnosis of influenza from Nasopharyngeal swab specimens and should not be used as a sole basis for treatment. Nasal washings and aspirates are unacceptable for Xpert Xpress SARS-CoV-2/FLU/RSV testing.  Fact Sheet for Patients: BloggerCourse.com  Fact Sheet for Healthcare Providers: SeriousBroker.it  This test is not yet approved or cleared by the [place]  FDA and has been authorized for detection and/or diagnosis of SARS-CoV-2 by FDA under an Emergency Use Authorization (EUA). This EUA will remain in effect (meaning this test can be used) for the duration of the COVID-19 declaration under Section 564(b)(1) of the Act, 21 U.S.C. section 360bbb-3(b)(1), unless the authorization is terminated or revoked.     Resp Syncytial Virus by PCR NEGATIVE NEGATIVE Final    Comment: (NOTE) Fact Sheet for Patients: BloggerCourse.com  Fact Sheet for Healthcare Providers: SeriousBroker.it  This test is not yet approved or cleared by the [place]  FDA and has been authorized for detection and/or diagnosis of SARS-CoV-2 by FDA under an Emergency Use Authorization (EUA). This EUA will remain in effect (meaning this test can be used) for the duration of the COVID-19 declaration under Section 564(b)(1) of the Act, 21 U.S.C. section  360bbb-3(b)(1), unless the authorization is terminated or revoked.  Performed at [facility], [address] [place], [place] [address]   Blood culture (routine x 2)     Status: None (Preliminary result)   Collection Time: 07/21/24  6:05 AM   Specimen: BLOOD LEFT HAND  Result Value Ref Range Status   Specimen Description   Final    BLOOD LEFT HAND Performed at [facility], [address] [place], [place] [address]    Special Requests   Final    BOTTLES DRAWN AEROBIC AND ANAEROBIC Blood Culture results may not be optimal due to an inadequate volume of blood received in culture bottles Performed at [facility], [address] [place], [place] [address]    Culture   Final    NO GROWTH 4 DAYS Performed at [facility], [address] [place], [place] [address]    Report Status PENDING  Incomplete  MRSA Next Gen by PCR, Nasal     Status: None   Collection Time: 07/21/24 10:33 AM   Specimen: Nasal Mucosa; Nasal Swab  Result Value Ref Range Status   MRSA by PCR Next Gen NOT DETECTED NOT DETECTED Final    Comment: (NOTE) The GeneXpert MRSA Assay (FDA approved for NASAL specimens only), is one component of a comprehensive MRSA colonization surveillance program. It is not intended to diagnose MRSA infection nor to guide or monitor treatment for MRSA infections. Test performance is not FDA approved in patients less than 64 years old. Performed at [facility], [address] [place], [place] [address]    Labs: CBC: Recent Labs  Lab 07/21/24 0547 07/21/24 0610 07/22/24 0300 07/23/24 0442  WBC 21.2*  --  17.5* 16.4*  NEUTROABS 18.0*  --   --   --   HGB 13.3 13.6 10.5* 11.0*  HCT 38.5* 40.0 33.0* 34.1*  MCV 89.1  --  94.0 92.4  PLT 383  --  289 394   Basic Metabolic Panel: Recent Labs  Lab 07/21/24 0547 07/21/24 0610 07/22/24 0300 07/23/24 0442  NA 137 135 137 142  K 4.6 4.6 4.4 4.0  CL 102  --  104 107   CO2 20*  --  24 23  GLUCOSE 123*  --  139* 91  BUN 32*  --  29* 26*  CREATININE 1.54*  --  1.20 1.05  CALCIUM  10.6*  --  10.4* 10.5*   Liver Function Tests: Recent Labs  Lab 07/21/24 0547  AST 12*  ALT 10  ALKPHOS 80  BILITOT 0.7  PROT 7.5  ALBUMIN 3.9   CBG: No results for input(s): GLUCAP in the last 168 hours.  Discharge time spent: greater than 30 minutes.  Signed: Alejandro Marker, DO [facility] 07/25/2024

## 2024-07-23 NOTE — Plan of Care (Signed)

## 2024-07-23 NOTE — Progress Notes (Incomplete)
 PROGRESS NOTE    Todd Valdez  FMW:[id number] DOB: Jun 10, 1961 DOA: 07/21/2024 PCP: Alvia Bring, DO   Brief Narrative:  No notes on file     DVT prophylaxis: enoxaparin  (LOVENOX ) injection 40 mg Start: 07/21/24 1145    Code Status: Full Code Family Communication:   Disposition Plan:  Level of care: Med-Surg Status is: Inpatient {Inpatient:23812}    Consultants:  ***  Procedures:  ***  Antimicrobials:  Anti-infectives (From admission, onward)    Start     Dose/Rate Route Frequency Ordered Stop   07/26/24 1000  doxycycline  (VIBRA -TABS) tablet 50 mg       Placed in Followed by Linked Group   50 mg Oral Daily 07/21/24 1327     07/22/24 0600  cefTRIAXone  (ROCEPHIN ) 1 g in sodium chloride  0.9 % 100 mL IVPB        1 g 200 mL/hr over 30 Minutes Intravenous Every 24 hours 07/21/24 1255 07/26/24 0559   07/21/24 2200  doxycycline  (VIBRA -TABS) tablet 100 mg       Placed in Followed by Linked Group   100 mg Oral 2 times daily 07/21/24 1327 07/26/24 0959   07/21/24 1400  doxycycline  (VIBRA -TABS) tablet 50 mg  Status:  Discontinued        50 mg Oral Daily 07/21/24 1302 07/21/24 1327   07/21/24 1345  azithromycin  (ZITHROMAX ) 500 mg in sodium chloride  0.9 % 250 mL IVPB  Status:  Discontinued        500 mg 250 mL/hr over 60 Minutes Intravenous Every 24 hours 07/21/24 1255 07/21/24 1301   07/21/24 0545  cefTRIAXone  (ROCEPHIN ) 2 g in sodium chloride  0.9 % 100 mL IVPB        2 g 200 mL/hr over 30 Minutes Intravenous  Once 07/21/24 0534 07/21/24 0645   07/21/24 0545  azithromycin  (ZITHROMAX ) 500 mg in sodium chloride  0.9 % 250 mL IVPB        500 mg 250 mL/hr over 60 Minutes Intravenous  Once 07/21/24 0534 07/21/24 0808        Subjective: ***  Objective: Vitals:   07/23/24 0000 07/23/24 0031 07/23/24 0515 07/23/24 0757  BP: (!) 113/57 118/78 119/64   Pulse: 89 88 84   Resp: 20 18 18    Temp:  97.7 F (36.5 C) (!) 97.5 F (36.4 C)   TempSrc:  Oral    SpO2: 91% 96% 94%  92%  Weight:      Height:        Intake/Output Summary (Last 24 hours) at 07/23/2024 0854 Last data filed at 07/23/2024 0800 Gross per 24 hour  Intake 240 ml  Output 200 ml  Net 40 ml   Filed Weights   07/21/24 0526 07/21/24 1030  Weight: 96.2 kg 83.3 kg    Examination: Physical Exam:  Constitutional: WN/WD, NAD and appears calm and comfortable Eyes: PERRL, lids and conjunctivae normal, sclerae anicteric  ENMT: External Ears, Nose appear normal. Grossly normal hearing. Mucous membranes are moist. Posterior pharynx clear of any exudate or lesions. Normal dentition.  Neck: Appears normal, supple, no cervical masses, normal ROM, no appreciable thyromegaly Respiratory: Clear to auscultation bilaterally, no wheezing, rales, rhonchi or crackles. Normal respiratory effort and patient is not tachypenic. No accessory muscle use.  Cardiovascular: RRR, no murmurs / rubs / gallops. S1 and S2 auscultated. No extremity edema. 2+ pedal pulses. No carotid bruits.  Abdomen: Soft, non-tender, non-distended. No masses palpated. No appreciable hepatosplenomegaly. Bowel sounds positive.  GU: Deferred. Musculoskeletal: No clubbing / cyanosis  of digits/nails. No joint deformity upper and lower extremities. Good ROM, no contractures. Normal strength and muscle tone.  Skin: No rashes, lesions, ulcers. No induration; Warm and dry.  Neurologic: CN 2-12 grossly intact with no focal deficits. Sensation intact in all 4 Extremities, DTR normal. Strength 5/5 in all 4. Romberg sign cerebellar reflexes not assessed.  Psychiatric: Normal judgment and insight. Alert and oriented x 3. Normal mood and appropriate affect.   Data Reviewed: I have personally reviewed following labs and imaging studies  CBC: Recent Labs  Lab 07/21/24 0547 07/21/24 0610 07/22/24 0300 07/23/24 0442  WBC 21.2*  --  17.5* 16.4*  NEUTROABS 18.0*  --   --   --   HGB 13.3 13.6 10.5* 11.0*  HCT 38.5* 40.0 33.0* 34.1*  MCV 89.1  --  94.0  92.4  PLT 383  --  289 394   Basic Metabolic Panel: Recent Labs  Lab 07/21/24 0547 07/21/24 0610 07/22/24 0300 07/23/24 0442  NA 137 135 137 142  K 4.6 4.6 4.4 4.0  CL 102  --  104 107  CO2 20*  --  24 23  GLUCOSE 123*  --  139* 91  BUN 32*  --  29* 26*  CREATININE 1.54*  --  1.20 1.05  CALCIUM  10.6*  --  10.4* 10.5*   GFR: Estimated Creatinine Clearance: 77 mL/min (by C-G formula based on SCr of 1.05 mg/dL). Liver Function Tests: Recent Labs  Lab 07/21/24 0547  AST 12*  ALT 10  ALKPHOS 80  BILITOT 0.7  PROT 7.5  ALBUMIN 3.9   No results for input(s): LIPASE, AMYLASE in the last 168 hours. No results for input(s): AMMONIA in the last 168 hours. Coagulation Profile: No results for input(s): INR, PROTIME in the last 168 hours. Cardiac Enzymes: No results for input(s): CKTOTAL, CKMB, CKMBINDEX, TROPONINI in the last 168 hours. BNP (last 3 results) No results for input(s): PROBNP in the last 8760 hours. HbA1C: No results for input(s): HGBA1C in the last 72 hours. CBG: No results for input(s): GLUCAP in the last 168 hours. Lipid Profile: No results for input(s): CHOL, HDL, LDLCALC, TRIG, CHOLHDL, LDLDIRECT in the last 72 hours. Thyroid  Function Tests: No results for input(s): TSH, T4TOTAL, FREET4, T3FREE, THYROIDAB in the last 72 hours. Anemia Panel: No results for input(s): VITAMINB12, FOLATE, FERRITIN, TIBC, IRON, RETICCTPCT in the last 72 hours. Sepsis Labs: Recent Labs  Lab 07/21/24 0547  LATICACIDVEN 0.8    Recent Results (from the past 240 hours)  Blood culture (routine x 2)     Status: None (Preliminary result)   Collection Time: 07/21/24  5:35 AM   Specimen: BLOOD LEFT FOREARM  Result Value Ref Range Status   Specimen Description   Final    BLOOD LEFT FOREARM Performed at [facility], [address] [place], [place] [address]    Special Requests   Final    BOTTLES DRAWN AEROBIC  AND ANAEROBIC Blood Culture adequate volume Performed at [facility], [address] [place], [place] [address]    Culture   Final    NO GROWTH 2 DAYS Performed at [facility], [address] [place], [place] [address]    Report Status PENDING  Incomplete  Resp panel by RT-PCR (RSV, Flu A&B, Covid) Anterior Nasal Swab     Status: None   Collection Time: 07/21/24  5:47 AM   Specimen: Anterior Nasal Swab  Result Value Ref Range Status  SARS Coronavirus 2 by RT PCR NEGATIVE NEGATIVE Final    Comment: (NOTE) SARS-CoV-2 target nucleic acids are NOT DETECTED.  The SARS-CoV-2 RNA is generally detectable in upper respiratory specimens during the acute phase of infection. The lowest concentration of SARS-CoV-2 viral copies this assay can detect is 138 copies/mL. A negative result does not preclude SARS-Cov-2 infection and should not be used as the sole basis for treatment or other patient management decisions. A negative result may occur with  improper specimen collection/handling, submission of specimen other than nasopharyngeal swab, presence of viral mutation(s) within the areas targeted by this assay, and inadequate number of viral copies(<138 copies/mL). A negative result must be combined with clinical observations, patient history, and epidemiological information. The expected result is Negative.  Fact Sheet for Patients:  BloggerCourse.com  Fact Sheet for Healthcare Providers:  SeriousBroker.it  This test is no t yet approved or cleared by the [place]  FDA and  has been authorized for detection and/or diagnosis of SARS-CoV-2 by FDA under an Emergency Use Authorization (EUA). This EUA will remain  in effect (meaning this test can be used) for the duration of the COVID-19 declaration under Section 564(b)(1) of the Act, 21 U.S.C.section 360bbb-3(b)(1), unless the authorization is terminated  or revoked  sooner.       Influenza A by PCR NEGATIVE NEGATIVE Final   Influenza B by PCR NEGATIVE NEGATIVE Final    Comment: (NOTE) The Xpert Xpress SARS-CoV-2/FLU/RSV plus assay is intended as an aid in the diagnosis of influenza from Nasopharyngeal swab specimens and should not be used as a sole basis for treatment. Nasal washings and aspirates are unacceptable for Xpert Xpress SARS-CoV-2/FLU/RSV testing.  Fact Sheet for Patients: BloggerCourse.com  Fact Sheet for Healthcare Providers: SeriousBroker.it  This test is not yet approved or cleared by the [place]  FDA and has been authorized for detection and/or diagnosis of SARS-CoV-2 by FDA under an Emergency Use Authorization (EUA). This EUA will remain in effect (meaning this test can be used) for the duration of the COVID-19 declaration under Section 564(b)(1) of the Act, 21 U.S.C. section 360bbb-3(b)(1), unless the authorization is terminated or revoked.     Resp Syncytial Virus by PCR NEGATIVE NEGATIVE Final    Comment: (NOTE) Fact Sheet for Patients: BloggerCourse.com  Fact Sheet for Healthcare Providers: SeriousBroker.it  This test is not yet approved or cleared by the [place]  FDA and has been authorized for detection and/or diagnosis of SARS-CoV-2 by FDA under an Emergency Use Authorization (EUA). This EUA will remain in effect (meaning this test can be used) for the duration of the COVID-19 declaration under Section 564(b)(1) of the Act, 21 U.S.C. section 360bbb-3(b)(1), unless the authorization is terminated or revoked.  Performed at [facility], [address] [place], [place] [address]   Blood culture (routine x 2)     Status: None (Preliminary result)   Collection Time: 07/21/24  6:05 AM   Specimen: BLOOD LEFT HAND  Result Value Ref Range Status   Specimen Description   Final    BLOOD LEFT  HAND Performed at [facility], [address] [place], [place] [address]    Special Requests   Final    BOTTLES DRAWN AEROBIC AND ANAEROBIC Blood Culture results may not be optimal due to an inadequate volume of blood received in culture bottles Performed at [facility], [address] [place], [place] [address]    Culture   Final  NO GROWTH 2 DAYS Performed at [facility], [address], [place], [place] [address]    Report Status PENDING  Incomplete  MRSA Next Gen by PCR, Nasal     Status: None   Collection Time: 07/21/24 10:33 AM   Specimen: Nasal Mucosa; Nasal Swab  Result Value Ref Range Status   MRSA by PCR Next Gen NOT DETECTED NOT DETECTED Final    Comment: (NOTE) The GeneXpert MRSA Assay (FDA approved for NASAL specimens only), is one component of a comprehensive MRSA colonization surveillance program. It is not intended to diagnose MRSA infection nor to guide or monitor treatment for MRSA infections. Test performance is not FDA approved in patients less than 20 years old. Performed at [facility], [address], [place], [place] [address]      Radiology Studies: No results found.   Scheduled Meds:  atorvastatin   40 mg Oral Daily   budesonide-glycopyrrolate-formoterol  2 puff Inhalation BID   Chlorhexidine  Gluconate Cloth  6 each Topical Daily   doxycycline   100 mg Oral BID   Followed by   NOREEN ON 07/26/2024] doxycycline   50 mg Oral Q0600   enoxaparin  (LOVENOX ) injection  40 mg Subcutaneous Q24H   folic acid   1 mg Oral Daily   guaiFENesin   600 mg Oral BID   ipratropium-albuterol   3 mL Nebulization BID   pantoprazole   80 mg Oral Q1200   predniSONE   40 mg Oral Q breakfast   Continuous Infusions:  sodium chloride      cefTRIAXone  (ROCEPHIN )  IV Stopped (07/22/24 0640)   lactated ringers        LOS: 2 days    Time spent: *** minutes spent on chart review, discussion with nursing staff, consultants,  updating family and interview/physical exam; more than 50% of that time was spent in counseling and/or coordination of care.   Alejandro Lazarus Marker, DO [facility] Available via Epic secure chat 7am-7pm After these hours, please refer to coverage provider listed on [email or web address] 07/23/2024, 8:54 AM

## 2024-07-24 ENCOUNTER — Telehealth: Payer: Self-pay

## 2024-07-24 DIAGNOSIS — J479 Bronchiectasis, uncomplicated: Secondary | ICD-10-CM

## 2024-07-24 NOTE — Telephone Encounter (Signed)
 pCopied from CRM #[id number]. Topic: Clinical - Request for Lab/Test Order >> Jul 24, 2024  9:34 AM Russell PARAS wrote: Reason for CRM:   Pt contacted clinic to schedule 5 mnth FU OV/PFT. Was able to schedule OV; however, there was no order for PFT in chart  Pt requested call back once order is placed, to schedule his PFT  CB#  [phone]  Please call and schedule pft,order placed

## 2024-07-25 ENCOUNTER — Other Ambulatory Visit: Payer: Self-pay

## 2024-07-26 LAB — CULTURE, BLOOD (ROUTINE X 2)
Culture: NO GROWTH
Culture: NO GROWTH
Special Requests: ADEQUATE

## 2024-07-28 ENCOUNTER — Ambulatory Visit: Admitting: Family Medicine

## 2024-07-28 ENCOUNTER — Other Ambulatory Visit (HOSPITAL_BASED_OUTPATIENT_CLINIC_OR_DEPARTMENT_OTHER): Payer: Self-pay

## 2024-07-28 ENCOUNTER — Encounter: Payer: Self-pay | Admitting: Family Medicine

## 2024-07-28 VITALS — BP 103/67 | HR 74 | Ht 72.0 in | Wt 213.0 lb

## 2024-07-28 DIAGNOSIS — J439 Emphysema, unspecified: Secondary | ICD-10-CM

## 2024-07-28 DIAGNOSIS — H16229 Keratoconjunctivitis sicca, not specified as Sjogren's, unspecified eye: Secondary | ICD-10-CM | POA: Insufficient documentation

## 2024-07-28 DIAGNOSIS — J188 Other pneumonia, unspecified organism: Secondary | ICD-10-CM | POA: Diagnosis not present

## 2024-07-28 DIAGNOSIS — J479 Bronchiectasis, uncomplicated: Secondary | ICD-10-CM | POA: Insufficient documentation

## 2024-07-28 DIAGNOSIS — H40013 Open angle with borderline findings, low risk, bilateral: Secondary | ICD-10-CM | POA: Diagnosis not present

## 2024-07-28 DIAGNOSIS — M069 Rheumatoid arthritis, unspecified: Secondary | ICD-10-CM

## 2024-07-28 DIAGNOSIS — H18463 Peripheral corneal degeneration, bilateral: Secondary | ICD-10-CM | POA: Diagnosis not present

## 2024-07-28 DIAGNOSIS — M1991 Primary osteoarthritis, unspecified site: Secondary | ICD-10-CM | POA: Insufficient documentation

## 2024-07-28 DIAGNOSIS — H179 Unspecified corneal scar and opacity: Secondary | ICD-10-CM | POA: Diagnosis not present

## 2024-07-28 MED ORDER — CYCLOBENZAPRINE HCL 10 MG PO TABS
10.0000 mg | ORAL_TABLET | Freq: Three times a day (TID) | ORAL | 0 refills | Status: DC | PRN
  Filled 2024-07-28: qty 30, 10d supply, fill #0

## 2024-07-28 NOTE — Patient Instructions (Addendum)
 Use incentive spirometer once per hour for the next 2-3 days.  Use flutter valve regularly.  Mucinex  with increased fluids.  Let me know if you start to feel worse.  Try flexeril  as needed for spasms along rib area.

## 2024-07-29 ENCOUNTER — Other Ambulatory Visit (HOSPITAL_BASED_OUTPATIENT_CLINIC_OR_DEPARTMENT_OTHER): Payer: Self-pay

## 2024-07-29 ENCOUNTER — Ambulatory Visit (HOSPITAL_BASED_OUTPATIENT_CLINIC_OR_DEPARTMENT_OTHER)
Admission: RE | Admit: 2024-07-29 | Discharge: 2024-07-29 | Disposition: A | Discharge status: Home / Self Care | Source: Ambulatory Visit | Attending: Family Medicine | Admitting: Family Medicine

## 2024-07-29 ENCOUNTER — Ambulatory Visit: Payer: Self-pay | Admitting: Family Medicine

## 2024-07-29 DIAGNOSIS — I2510 Atherosclerotic heart disease of native coronary artery without angina pectoris: Secondary | ICD-10-CM | POA: Diagnosis not present

## 2024-07-29 DIAGNOSIS — J189 Pneumonia, unspecified organism: Secondary | ICD-10-CM

## 2024-07-29 DIAGNOSIS — D72829 Elevated white blood cell count, unspecified: Secondary | ICD-10-CM | POA: Diagnosis not present

## 2024-07-29 DIAGNOSIS — J90 Pleural effusion, not elsewhere classified: Secondary | ICD-10-CM | POA: Insufficient documentation

## 2024-07-29 DIAGNOSIS — J432 Centrilobular emphysema: Secondary | ICD-10-CM | POA: Diagnosis not present

## 2024-07-29 DIAGNOSIS — K449 Diaphragmatic hernia without obstruction or gangrene: Secondary | ICD-10-CM | POA: Diagnosis not present

## 2024-07-29 DIAGNOSIS — R7989 Other specified abnormal findings of blood chemistry: Secondary | ICD-10-CM

## 2024-07-29 DIAGNOSIS — I700 Atherosclerosis of aorta: Secondary | ICD-10-CM | POA: Diagnosis not present

## 2024-07-29 DIAGNOSIS — R918 Other nonspecific abnormal finding of lung field: Secondary | ICD-10-CM | POA: Diagnosis not present

## 2024-07-29 DIAGNOSIS — J188 Other pneumonia, unspecified organism: Secondary | ICD-10-CM

## 2024-07-29 LAB — CMP14+EGFR
ALT: 127 IU/L — ABNORMAL HIGH (ref 0–44)
AST: 60 IU/L — ABNORMAL HIGH (ref 0–40)
Albumin: 3.1 g/dL — ABNORMAL LOW (ref 3.9–4.9)
Alkaline Phosphatase: 157 IU/L — ABNORMAL HIGH (ref 47–123)
BUN/Creatinine Ratio: 29 — ABNORMAL HIGH (ref 10–24)
BUN: 25 mg/dL (ref 8–27)
Bilirubin Total: 0.6 mg/dL (ref 0.0–1.2)
CO2: 18 mmol/L — ABNORMAL LOW (ref 20–29)
Calcium: 10.8 mg/dL — ABNORMAL HIGH (ref 8.6–10.2)
Chloride: 102 mmol/L (ref 96–106)
Creatinine, Ser: 0.87 mg/dL (ref 0.76–1.27)
Globulin, Total: 2.7 g/dL (ref 1.5–4.5)
Glucose: 112 mg/dL — ABNORMAL HIGH (ref 70–99)
Potassium: 5.1 mmol/L (ref 3.5–5.2)
Sodium: 136 mmol/L (ref 134–144)
Total Protein: 5.8 g/dL — ABNORMAL LOW (ref 6.0–8.5)
eGFR: 96 mL/min/1.73 (ref >59)

## 2024-07-29 LAB — CBC WITH DIFFERENTIAL/PLATELET
Basophils Absolute: 0.1 x10E3/uL (ref 0.0–0.2)
Basos: 0 %
EOS (ABSOLUTE): 0.2 x10E3/uL (ref 0.0–0.4)
Eos: 1 %
Hematocrit: 37.2 % — ABNORMAL LOW (ref 37.5–51.0)
Hemoglobin: 12.2 g/dL — ABNORMAL LOW (ref 13.0–17.7)
Immature Grans (Abs): 0.9 x10E3/uL — ABNORMAL HIGH (ref 0.0–0.1)
Immature Granulocytes: 4 %
Lymphocytes Absolute: 1.2 x10E3/uL (ref 0.7–3.1)
Lymphs: 6 %
MCH: 29.8 pg (ref 26.6–33.0)
MCHC: 32.8 g/dL (ref 31.5–35.7)
MCV: 91 fL (ref 79–97)
Monocytes Absolute: 1.8 x10E3/uL — ABNORMAL HIGH (ref 0.1–0.9)
Monocytes: 8 %
Neutrophils Absolute: 17.3 x10E3/uL — ABNORMAL HIGH (ref 1.4–7.0)
Neutrophils: 81 %
Platelets: 626 x10E3/uL — ABNORMAL HIGH (ref 150–450)
RBC: 4.09 x10E6/uL — ABNORMAL LOW (ref 4.14–5.80)
RDW: 13 % (ref 11.6–15.4)
WBC: 21.3 x10E3/uL (ref 3.4–10.8)

## 2024-07-29 MED ORDER — LEVOFLOXACIN 750 MG PO TABS
750.0000 mg | ORAL_TABLET | Freq: Every day | ORAL | 0 refills | Status: DC
  Filled 2024-07-29: qty 7, 7d supply, fill #0

## 2024-07-30 ENCOUNTER — Ambulatory Visit: Payer: Self-pay | Admitting: Family Medicine

## 2024-07-30 NOTE — Progress Notes (Signed)
 Spoke with patient. He has spoken with Dr. Alvia yesterday.  He has had CT - these results are now in chart but not yet reviewed by provider.  He has picked up the Levaquin and has started taking this.  He has had his lab work drawn yesterday but this has not return as of today 07/30/2024@ 10:41am

## 2024-07-31 DIAGNOSIS — D225 Melanocytic nevi of trunk: Secondary | ICD-10-CM | POA: Diagnosis not present

## 2024-07-31 DIAGNOSIS — C44719 Basal cell carcinoma of skin of left lower limb, including hip: Secondary | ICD-10-CM | POA: Diagnosis not present

## 2024-07-31 DIAGNOSIS — L570 Actinic keratosis: Secondary | ICD-10-CM | POA: Diagnosis not present

## 2024-07-31 DIAGNOSIS — L821 Other seborrheic keratosis: Secondary | ICD-10-CM | POA: Diagnosis not present

## 2024-07-31 DIAGNOSIS — Z85820 Personal history of malignant melanoma of skin: Secondary | ICD-10-CM | POA: Diagnosis not present

## 2024-07-31 LAB — CBC WITH DIFFERENTIAL/PLATELET
Basophils Absolute: 0 x10E3/uL (ref 0.0–0.2)
Basos: 0 %
EOS (ABSOLUTE): 0.2 x10E3/uL (ref 0.0–0.4)
Eos: 1 %
Hematocrit: 39.1 % (ref 37.5–51.0)
Hemoglobin: 13 g/dL (ref 13.0–17.7)
Immature Grans (Abs): 0.7 x10E3/uL — ABNORMAL HIGH (ref 0.0–0.1)
Immature Granulocytes: 3 %
Lymphocytes Absolute: 1.2 x10E3/uL (ref 0.7–3.1)
Lymphs: 5 %
MCH: 29.9 pg (ref 26.6–33.0)
MCHC: 33.2 g/dL (ref 31.5–35.7)
MCV: 90 fL (ref 79–97)
Monocytes Absolute: 1.2 x10E3/uL — ABNORMAL HIGH (ref 0.1–0.9)
Monocytes: 5 %
Neutrophils Absolute: 20 x10E3/uL — ABNORMAL HIGH (ref 1.4–7.0)
Neutrophils: 86 %
Platelets: 708 x10E3/uL — ABNORMAL HIGH (ref 150–450)
RBC: 4.35 x10E6/uL (ref 4.14–5.80)
RDW: 13 % (ref 11.6–15.4)
WBC: 23.4 x10E3/uL (ref 3.4–10.8)

## 2024-07-31 LAB — HEPATIC FUNCTION PANEL
ALT: 124 IU/L — ABNORMAL HIGH (ref 0–44)
AST: 52 IU/L — ABNORMAL HIGH (ref 0–40)
Albumin: 3.2 g/dL — ABNORMAL LOW (ref 3.9–4.9)
Alkaline Phosphatase: 178 IU/L — ABNORMAL HIGH (ref 47–123)
Bilirubin Total: 0.6 mg/dL (ref 0.0–1.2)
Bilirubin, Direct: 0.29 mg/dL (ref 0.00–0.40)
Total Protein: 6.1 g/dL (ref 6.0–8.5)

## 2024-07-31 LAB — LEGIONELLA PNEUMOPHILA TOTAL AB: Legionella Pneumo Total Ab: NONREACTIVE

## 2024-07-31 LAB — LEGIONELLA PNEUMOPHILA SEROGP 1 UR AG: L. pneumophila Serogp 1 Ur Ag: NEGATIVE

## 2024-07-31 LAB — C-REACTIVE PROTEIN: CRP: 121 mg/L — ABNORMAL HIGH (ref 0–10)

## 2024-08-01 DIAGNOSIS — D72829 Elevated white blood cell count, unspecified: Secondary | ICD-10-CM | POA: Diagnosis not present

## 2024-08-01 DIAGNOSIS — J188 Other pneumonia, unspecified organism: Secondary | ICD-10-CM | POA: Diagnosis not present

## 2024-08-01 DIAGNOSIS — J189 Pneumonia, unspecified organism: Secondary | ICD-10-CM | POA: Diagnosis not present

## 2024-08-02 LAB — CBC WITH DIFFERENTIAL/PLATELET
Basophils Absolute: 0.1 x10E3/uL (ref 0.0–0.2)
Basos: 0 %
EOS (ABSOLUTE): 0.7 x10E3/uL — ABNORMAL HIGH (ref 0.0–0.4)
Eos: 4 %
Hematocrit: 38.6 % (ref 37.5–51.0)
Hemoglobin: 12.4 g/dL — ABNORMAL LOW (ref 13.0–17.7)
Immature Grans (Abs): 0.9 x10E3/uL — ABNORMAL HIGH (ref 0.0–0.1)
Immature Granulocytes: 5 %
Lymphocytes Absolute: 2.8 x10E3/uL (ref 0.7–3.1)
Lymphs: 15 %
MCH: 29.5 pg (ref 26.6–33.0)
MCHC: 32.1 g/dL (ref 31.5–35.7)
MCV: 92 fL (ref 79–97)
Monocytes Absolute: 1.7 x10E3/uL — ABNORMAL HIGH (ref 0.1–0.9)
Monocytes: 9 %
Neutrophils Absolute: 12.7 x10E3/uL — ABNORMAL HIGH (ref 1.4–7.0)
Neutrophils: 67 %
Platelets: 626 x10E3/uL — ABNORMAL HIGH (ref 150–450)
RBC: 4.2 x10E6/uL (ref 4.14–5.80)
RDW: 13.1 % (ref 11.6–15.4)
WBC: 18.7 x10E3/uL — ABNORMAL HIGH (ref 3.4–10.8)

## 2024-08-03 NOTE — Assessment & Plan Note (Signed)
 He still has some diminished breath sounds along the right lower lobe.  Encouraged to increase use of incentive spirometer.  Rechecking CBC and CMP.  Continue doxycycline .  Red flags reviewed.

## 2024-08-03 NOTE — Progress Notes (Signed)
 Todd Valdez - 63 y.o. male MRN [id number]  Date of birth: Oct 28, 1960  Subjective Chief Complaint  Patient presents with   Hospitalization Follow-up    HPI Todd Valdez is a .63 y.o. male here today for hospital follow-up.  He has history of rheumatoid arthritis and is on immunosuppressant therapy.  He was recently hospitalized from 07/21/2024 to 07/23/2024.  Initially presented to [facility] ED complaining of cough, shortness of breath pain on the right side of his back and fever with Tmax of 102.  His wife has been traveling to [place]  to [place]  when he became ill.  In ED he was febrile to 100.9.  He was hypotensive with O2 saturations of 91% on 2 L.  Chest x-ray with bibasilar atelectasis.  CT angiogram with right lower lobe collapse/consolidation, lingular collapse/consolidation and posterior left lower lobe ground glass opacities, consistent with multifocal pneumonia.  Treated with azithromycin  and ceftriaxone  empirically.  He is also supported with Solu-Medrol  and nebulizer treatment.  During his hospital course he did steadily improve with transition to oral steroids and antibiotics.  He was discharged home with doxycycline  and Duricef.  He has completed Duricef.  He does continue to have cough and mild dyspnea with fatigue.  He does overall feel that symptoms are better since he was initially admitted.  He is continue to use flutter valve and incentive spirometer.  During his hospitalization he did have acute kidney injury which was improving at time of discharge.   No Known Allergies  Past Medical History:  Diagnosis Date   Arthritis    Cancer (HCC)    Melanoma on  back   Diverticulitis    GERD (gastroesophageal reflux disease)    High triglycerides    History of abscess of skin and subcutaneous tissue    right forearm, chin   Hypertension    RA (rheumatoid arthritis) (HCC)    Reflux esophagitis    Right inguinal hernia    Sleep apnea    no cpap    Past  Surgical History:  Procedure Laterality Date   bilateral feet reconstruction  10/2000   COLONOSCOPY     CYSTOSCOPY W/ URETERAL STENT PLACEMENT Left 11/01/2023   Procedure: CYSTOSCOPY WITH RETROGRADE PYELOGRAM/URETERAL STENT PLACEMENT;  Surgeon: Nieves Cough, MD;  Location: [facility];  Service: Urology;  Laterality: Left;   feet surgery     INGUINAL HERNIA REPAIR Right 11/27/2017   Procedure: OPEN RIGHT INGUINAL HERNIA REPAIR;  Surgeon: Eletha Boas, MD;  Location: [facility];  Service: General;  Laterality: Right;   INSERTION OF MESH Right 11/27/2017   Procedure: INSERTION OF MESH;  Surgeon: Eletha Boas, MD;  Location: [facility];  Service: General;  Laterality: Right;   MELANOMA EXCISION     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   Procedure: Right transmetatarsal amputation and heelcord lengthening;  Surgeon: Kit Rush, MD;  Location: [facility];  Service: Orthopedics;  Laterality: Right;   UPPER GI ENDOSCOPY  12/2011    Social History   Socioeconomic History   Marital status: Married    Spouse name: Not on file   Number of children: Not on file   Years of education: Not on file   Highest education level: 12th grade  Occupational History   Not on file  Tobacco Use   Smoking status: Former    Current packs/day: 0.00    Types: Cigarettes    Quit date: 02/07/1991    Years since quitting: 33.5  Smokeless tobacco: Former    Quit date: 08/14/2009  Vaping Use   Vaping status: Never Used  Substance and Sexual Activity   Alcohol use: No   Drug use: No   Sexual activity: Yes    Partners: Female    Comment: married.   Other Topics Concern   Not on file  Social History Narrative   Not on file   Social Drivers of Health   Financial Resource Strain: Low Risk  (12/03/2023)   Overall Financial Resource Strain (CARDIA)    Difficulty of Paying Living Expenses: Not very hard  Food Insecurity: No Food Insecurity (07/21/2024)    Hunger Vital Sign    Worried About Running Out of Food in the Last Year: Never true    Ran Out of Food in the Last Year: Never true  Transportation Needs: No Transportation Needs (07/21/2024)   PRAPARE - Administrator, Civil Service (Medical): No    Lack of Transportation (Non-Medical): No  Physical Activity: Insufficiently Active (12/03/2023)   Exercise Vital Sign    Days of Exercise per Week: 2 days    Minutes of Exercise per Session: 20 min  Stress: No Stress Concern Present (12/03/2023)   [employer] of Occupational Health - Occupational Stress Questionnaire    Feeling of Stress : Not at all  Social Connections: Moderately Integrated (12/03/2023)   Social Connection and Isolation Panel    Frequency of Communication with Friends and Family: Once a week    Frequency of Social Gatherings with Friends and Family: Once a week    Attends Religious Services: More than 4 times per year    Active Member of [employer] or Organizations: Yes    Attends Banker Meetings: 1 to 4 times per year    Marital Status: Married    Family History  Problem Relation Age of Onset   Hypertension Mother    Hypertension Father     Health Maintenance  Topic Date Due   Medicare Annual Wellness (AWV)  Never done   Zoster Vaccines- Shingrix (1 of 2) Never done   Influenza Vaccine  01/06/2025 (Originally 05/09/2024)   Pneumococcal Vaccine: 50+ Years (3 of 3 - PCV20 or PCV21) 01/29/2025   DTaP/Tdap/Td (2 - Td or Tdap) 01/03/2027   Colonoscopy  10/27/2030   Hepatitis C Screening  Completed   HIV Screening  Completed   Hepatitis B Vaccines 19-59 Average Risk  Aged Out   Meningococcal B Vaccine  Aged Out   COVID-19 Vaccine  Discontinued     ----------------------------------------------------------------------------------------------------------------------------------------------------------------------------------------------------------------- Physical Exam BP 103/67 (BP  Location: Left Arm, Patient Position: Sitting, Cuff Size: Normal)   Pulse 74   Ht 6' (1.829 m)   Wt 213 lb (96.6 kg)   SpO2 95%   BMI 28.89 kg/m   Physical Exam Constitutional:      Appearance: Normal appearance.  Cardiovascular:     Rate and Rhythm: Normal rate and regular rhythm.  Pulmonary:     Effort: Pulmonary effort is normal.     Breath sounds: Normal breath sounds.     Comments: Slightly diminished breath sounds along the right lower lobe. Neurological:     Mental Status: He is alert.  Psychiatric:        Mood and Affect: Mood normal.        Behavior: Behavior normal.     ------------------------------------------------------------------------------------------------------------------------------------------------------------------------------------------------------------------- Assessment and Plan  Rheumatoid arthritis (HCC) He is currently holding methotrexate  until current illness is resolved and he follows up  with rheumatology.  Multifocal pneumonia He still has some diminished breath sounds along the right lower lobe.  Encouraged to increase use of incentive spirometer.  Rechecking CBC and CMP.  Continue doxycycline .  Red flags reviewed.  Emphysema with bronchiectasis He is planning on following up with pulmonology.  Continue Trelegy for now.   Meds ordered this encounter  Medications   cyclobenzaprine  (FLEXERIL ) 10 MG tablet    Sig: Take 1 tablet (10 mg total) by mouth 3 (three) times daily as needed for muscle spasms.    Dispense:  30 tablet    Refill:  0    Return in about 3 weeks (around 08/18/2024) for Pneumonia.

## 2024-08-03 NOTE — Assessment & Plan Note (Signed)
 He is currently holding methotrexate  until current illness is resolved and he follows up with rheumatology.

## 2024-08-03 NOTE — Assessment & Plan Note (Signed)
 He is planning on following up with pulmonology.  Continue Trelegy for now.

## 2024-08-04 ENCOUNTER — Other Ambulatory Visit: Payer: Self-pay

## 2024-08-04 ENCOUNTER — Other Ambulatory Visit: Payer: Self-pay | Admitting: Family Medicine

## 2024-08-04 ENCOUNTER — Other Ambulatory Visit (HOSPITAL_BASED_OUTPATIENT_CLINIC_OR_DEPARTMENT_OTHER): Payer: Self-pay

## 2024-08-04 DIAGNOSIS — J188 Other pneumonia, unspecified organism: Secondary | ICD-10-CM

## 2024-08-05 DIAGNOSIS — J188 Other pneumonia, unspecified organism: Secondary | ICD-10-CM | POA: Diagnosis not present

## 2024-08-06 LAB — CBC WITH DIFFERENTIAL/PLATELET
Basophils Absolute: 0.1 x10E3/uL (ref 0.0–0.2)
Basos: 1 %
EOS (ABSOLUTE): 0.5 x10E3/uL — ABNORMAL HIGH (ref 0.0–0.4)
Eos: 3 %
Hematocrit: 39 % (ref 37.5–51.0)
Hemoglobin: 12.5 g/dL — ABNORMAL LOW (ref 13.0–17.7)
Immature Grans (Abs): 0.7 x10E3/uL — ABNORMAL HIGH (ref 0.0–0.1)
Immature Granulocytes: 4 %
Lymphocytes Absolute: 2.5 x10E3/uL (ref 0.7–3.1)
Lymphs: 14 %
MCH: 29.2 pg (ref 26.6–33.0)
MCHC: 32.1 g/dL (ref 31.5–35.7)
MCV: 91 fL (ref 79–97)
Monocytes Absolute: 1.3 x10E3/uL — ABNORMAL HIGH (ref 0.1–0.9)
Monocytes: 7 %
Neutrophils Absolute: 12.8 x10E3/uL — ABNORMAL HIGH (ref 1.4–7.0)
Neutrophils: 71 %
Platelets: 556 x10E3/uL — ABNORMAL HIGH (ref 150–450)
RBC: 4.28 x10E6/uL (ref 4.14–5.80)
RDW: 13.2 % (ref 11.6–15.4)
WBC: 17.9 x10E3/uL — ABNORMAL HIGH (ref 3.4–10.8)

## 2024-08-06 LAB — C-REACTIVE PROTEIN: CRP: 124 mg/L — ABNORMAL HIGH (ref 0–10)

## 2024-08-10 ENCOUNTER — Encounter (HOSPITAL_BASED_OUTPATIENT_CLINIC_OR_DEPARTMENT_OTHER): Admitting: Internal Medicine

## 2024-08-12 ENCOUNTER — Encounter: Payer: Self-pay | Admitting: Family Medicine

## 2024-08-12 ENCOUNTER — Other Ambulatory Visit: Payer: Self-pay | Admitting: Family Medicine

## 2024-08-12 ENCOUNTER — Ambulatory Visit: Payer: Self-pay | Admitting: Family Medicine

## 2024-08-12 ENCOUNTER — Telehealth: Payer: Self-pay | Admitting: Family Medicine

## 2024-08-12 ENCOUNTER — Other Ambulatory Visit (HOSPITAL_BASED_OUTPATIENT_CLINIC_OR_DEPARTMENT_OTHER): Payer: Self-pay

## 2024-08-12 ENCOUNTER — Other Ambulatory Visit: Payer: Self-pay

## 2024-08-12 ENCOUNTER — Ambulatory Visit: Admitting: Family Medicine

## 2024-08-12 VITALS — BP 104/64 | HR 104 | Ht 72.0 in | Wt 206.0 lb

## 2024-08-12 DIAGNOSIS — J188 Other pneumonia, unspecified organism: Secondary | ICD-10-CM

## 2024-08-12 DIAGNOSIS — R0600 Dyspnea, unspecified: Secondary | ICD-10-CM | POA: Diagnosis not present

## 2024-08-12 DIAGNOSIS — I10 Essential (primary) hypertension: Secondary | ICD-10-CM

## 2024-08-12 LAB — CMP14+EGFR
ALT: 17 IU/L (ref 0–44)
AST: 14 IU/L (ref 0–40)
Albumin: 3.3 g/dL — ABNORMAL LOW (ref 3.9–4.9)
Alkaline Phosphatase: 119 IU/L (ref 47–123)
BUN/Creatinine Ratio: 21 (ref 10–24)
BUN: 24 mg/dL (ref 8–27)
Bilirubin Total: 0.3 mg/dL (ref 0.0–1.2)
CO2: 23 mmol/L (ref 20–29)
Calcium: 10.5 mg/dL — ABNORMAL HIGH (ref 8.6–10.2)
Chloride: 101 mmol/L (ref 96–106)
Creatinine, Ser: 1.16 mg/dL (ref 0.76–1.27)
Globulin, Total: 3.2 g/dL (ref 1.5–4.5)
Glucose: 80 mg/dL (ref 70–99)
Potassium: 4.8 mmol/L (ref 3.5–5.2)
Sodium: 139 mmol/L (ref 134–144)
Total Protein: 6.5 g/dL (ref 6.0–8.5)
eGFR: 70 mL/min/1.73 (ref >59)

## 2024-08-12 LAB — CBC WITH DIFFERENTIAL/PLATELET
Basophils Absolute: 0.1 x10E3/uL (ref 0.0–0.2)
Basos: 1 %
EOS (ABSOLUTE): 0.5 x10E3/uL — ABNORMAL HIGH (ref 0.0–0.4)
Eos: 3 %
Hematocrit: 36.8 % — ABNORMAL LOW (ref 37.5–51.0)
Hemoglobin: 11.8 g/dL — ABNORMAL LOW (ref 13.0–17.7)
Immature Granulocytes: 1 %
Lymphocytes Absolute: 2.5 x10E3/uL (ref 0.7–3.1)
Lymphs: 17 %
MCH: 28.6 pg (ref 26.6–33.0)
MCHC: 32.1 g/dL (ref 31.5–35.7)
MCV: 89 fL (ref 79–97)
Monocytes Absolute: 1.1 x10E3/uL — ABNORMAL HIGH (ref 0.1–0.9)
Monocytes: 8 %
Neutrophils Absolute: 10.5 x10E3/uL — ABNORMAL HIGH (ref 1.4–7.0)
Neutrophils: 70 %
Platelets: 427 x10E3/uL (ref 150–450)
RBC: 4.13 x10E6/uL — ABNORMAL LOW (ref 4.14–5.80)
RDW: 13.9 % (ref 11.6–15.4)
WBC: 14.9 x10E3/uL — ABNORMAL HIGH (ref 3.4–10.8)

## 2024-08-12 MED ORDER — HYDROCOD POLI-CHLORPHE POLI ER 10-8 MG/5ML PO SUER
5.0000 mL | Freq: Two times a day (BID) | ORAL | 0 refills | Status: DC | PRN
  Filled 2024-08-12: qty 150, 15d supply, fill #0

## 2024-08-12 MED ORDER — LISINOPRIL-HYDROCHLOROTHIAZIDE 20-25 MG PO TABS
1.0000 | ORAL_TABLET | Freq: Every day | ORAL | 1 refills | Status: DC
  Filled 2024-08-12: qty 90, 90d supply, fill #0

## 2024-08-12 MED ORDER — HYDROCODONE BIT-HOMATROP MBR 5-1.5 MG/5ML PO SOLN
5.0000 mL | Freq: Four times a day (QID) | ORAL | 0 refills | Status: DC | PRN
  Filled 2024-08-12: qty 150, 8d supply, fill #0

## 2024-08-12 NOTE — Progress Notes (Signed)
 Todd Valdez - 63 y.o. male MRN [id number]  Date of birth: January 20, 1961  Subjective Chief Complaint  Patient presents with   URI    HPI Todd Valdez is a 63 y.o. male here today for follow up visit.   He was hospitalized with pneumonia last month.  At follow up with me he continued to have dyspnea, harsh cough and fatigue.  WBC was elevated more with elevated CRP.  We added levaquin at that time with improvement in WBC.  CT chest with better aeration compared to previous imaging.  Overall he is feeling better but gets winded with little activity.  He continues to have elevated temp and cough.  Cough is not very productive.  He does have pain in his chest when coughing but denies chest pressure.  ROS:  A comprehensive ROS was completed and negative except as noted per HPI  No Known Allergies  Past Medical History:  Diagnosis Date   Arthritis    Cancer (HCC)    Melanoma on  back   Diverticulitis    GERD (gastroesophageal reflux disease)    High triglycerides    History of abscess of skin and subcutaneous tissue    right forearm, chin   Hypertension    RA (rheumatoid arthritis) (HCC)    Reflux esophagitis    Right inguinal hernia    Sleep apnea    no cpap    Past Surgical History:  Procedure Laterality Date   bilateral feet reconstruction  10/2000   COLONOSCOPY     CYSTOSCOPY W/ URETERAL STENT PLACEMENT Left 11/01/2023   Procedure: CYSTOSCOPY WITH RETROGRADE PYELOGRAM/URETERAL STENT PLACEMENT;  Surgeon: Nieves Cough, MD;  Location: [facility];  Service: Urology;  Laterality: Left;   feet surgery     INGUINAL HERNIA REPAIR Right 11/27/2017   Procedure: OPEN RIGHT INGUINAL HERNIA REPAIR;  Surgeon: Eletha Boas, MD;  Location: [facility];  Service: General;  Laterality: Right;   INSERTION OF MESH Right 11/27/2017   Procedure: INSERTION OF MESH;  Surgeon: Eletha Boas, MD;  Location: [facility];  Service: General;  Laterality: Right;   MELANOMA EXCISION      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   Procedure: Right transmetatarsal amputation and heelcord lengthening;  Surgeon: Kit Rush, MD;  Location: [facility];  Service: Orthopedics;  Laterality: Right;   UPPER GI ENDOSCOPY  12/2011    Social History   Socioeconomic History   Marital status: Married    Spouse name: Not on file   Number of children: Not on file   Years of education: Not on file   Highest education level: 12th grade  Occupational History   Not on file  Tobacco Use   Smoking status: Former    Current packs/day: 0.00    Types: Cigarettes    Quit date: 02/07/1991    Years since quitting: 33.5   Smokeless tobacco: Former    Quit date: 08/14/2009  Vaping Use   Vaping status: Never Used  Substance and Sexual Activity   Alcohol use: No   Drug use: No   Sexual activity: Yes    Partners: Female    Comment: married.   Other Topics Concern   Not on file  Social History Narrative   Not on file   Social Drivers of Health   Financial Resource Strain: Low Risk  (12/03/2023)   Overall Financial Resource Strain (CARDIA)    Difficulty of Paying Living Expenses: Not very hard  Food Insecurity: No Food  Insecurity (07/21/2024)   Hunger Vital Sign    Worried About Running Out of Food in the Last Year: Never true    Ran Out of Food in the Last Year: Never true  Transportation Needs: No Transportation Needs (07/21/2024)   PRAPARE - Administrator, Civil Service (Medical): No    Lack of Transportation (Non-Medical): No  Physical Activity: Insufficiently Active (12/03/2023)   Exercise Vital Sign    Days of Exercise per Week: 2 days    Minutes of Exercise per Session: 20 min  Stress: No Stress Concern Present (12/03/2023)   [employer] of Occupational Health - Occupational Stress Questionnaire    Feeling of Stress : Not at all  Social Connections: Moderately Integrated (12/03/2023)   Social Connection and Isolation Panel    Frequency of  Communication with Friends and Family: Once a week    Frequency of Social Gatherings with Friends and Family: Once a week    Attends Religious Services: More than 4 times per year    Active Member of [employer] or Organizations: Yes    Attends Banker Meetings: 1 to 4 times per year    Marital Status: Married    Family History  Problem Relation Age of Onset   Hypertension Mother    Hypertension Father     Health Maintenance  Topic Date Due   Medicare Annual Wellness (AWV)  Never done   Zoster Vaccines- Shingrix (1 of 2) Never done   Influenza Vaccine  01/06/2025 (Originally 05/09/2024)   Pneumococcal Vaccine: 50+ Years (3 of 3 - PCV20 or PCV21) 01/29/2025   DTaP/Tdap/Td (2 - Td or Tdap) 01/03/2027   Colonoscopy  10/27/2030   Hepatitis C Screening  Completed   HIV Screening  Completed   Hepatitis B Vaccines 19-59 Average Risk  Aged Out   Meningococcal B Vaccine  Aged Out   COVID-19 Vaccine  Discontinued     ----------------------------------------------------------------------------------------------------------------------------------------------------------------------------------------------------------------- Physical Exam BP 104/64   Pulse (!) 104   Ht 6' (1.829 m)   Wt 206 lb (93.4 kg)   SpO2 95%   BMI 27.94 kg/m   Physical Exam Constitutional:      Appearance: Normal appearance.  HENT:     Head: Normocephalic and atraumatic.  Eyes:     General: No scleral icterus. Cardiovascular:     Rate and Rhythm: Normal rate and regular rhythm.  Pulmonary:     Effort: Pulmonary effort is normal.     Comments: Mildly diminished breath sounds in the right lower lobe Musculoskeletal:     Cervical back: Neck supple.  Neurological:     Mental Status: He is alert.  Psychiatric:        Mood and Affect: Mood normal.        Behavior: Behavior normal.      ------------------------------------------------------------------------------------------------------------------------------------------------------------------------------------------------------------------- Assessment and Plan  Multifocal pneumonia Continues to have significant dyspnea on exertion.  Most recent CT did show improved aeration and improving pneumonia.  White blood cell count was trending down and we will recheck CBC to make sure this is still the case.  Given his continued dyspnea as well as a little bit of pericardial fluid noted on CT I ordered an echocardiogram.   Meds ordered this encounter  Medications   DISCONTD: chlorpheniramine-HYDROcodone  (TUSSIONEX) 10-8 MG/5ML    Sig: Take 5 mLs by mouth every 12 (twelve) hours as needed for cough (cough, will cause drowsiness.).    Dispense:  150 mL    Refill:  0  HYDROcodone  bit-homatropine (HYDROMET) 5-1.5 MG/5ML syrup    Sig: Take 5 mLs by mouth every 6 (six) hours as needed for cough.    Dispense:  150 mL    Refill:  0    No follow-ups on file.

## 2024-08-12 NOTE — Assessment & Plan Note (Addendum)
 Continues to have significant dyspnea on exertion.  Most recent CT did show improved aeration and improving pneumonia.  White blood cell count was trending down and we will recheck CBC to make sure this is still the case.  Given his continued dyspnea as well as a little bit of pericardial fluid noted on CT I ordered an echocardiogram.  Hydromet cough syrup sent in.

## 2024-08-12 NOTE — Telephone Encounter (Signed)
 Copied from CRM [id number]. Topic: Clinical - Medication Question >> Aug 12, 2024 11:42 AM Todd Valdez wrote: Reason for CRM: Pt called to see if cough medication was sent in yet. I checked and did not see it or in notes as of yet, and pt advised it was probably forgotten. I someone can send it in please. CB [phone] Cough syrup with codine

## 2024-08-12 NOTE — Telephone Encounter (Signed)
 Please review message. Routed to our office in error.

## 2024-08-13 LAB — C-REACTIVE PROTEIN: CRP: 78 mg/L — ABNORMAL HIGH (ref 0–10)

## 2024-08-13 LAB — BRAIN NATRIURETIC PEPTIDE: BNP: 13.9 pg/mL (ref 0.0–100.0)

## 2024-08-18 ENCOUNTER — Encounter (HOSPITAL_BASED_OUTPATIENT_CLINIC_OR_DEPARTMENT_OTHER): Admitting: Internal Medicine

## 2024-08-18 ENCOUNTER — Ambulatory Visit: Admitting: Family Medicine

## 2024-08-21 ENCOUNTER — Other Ambulatory Visit: Payer: Self-pay

## 2024-08-25 NOTE — Telephone Encounter (Signed)
 Patient scheduled.

## 2024-08-26 ENCOUNTER — Encounter (HOSPITAL_COMMUNITY): Payer: Self-pay

## 2024-08-26 ENCOUNTER — Ambulatory Visit (INDEPENDENT_AMBULATORY_CARE_PROVIDER_SITE_OTHER): Admitting: Family Medicine

## 2024-08-26 ENCOUNTER — Emergency Department (HOSPITAL_COMMUNITY)

## 2024-08-26 ENCOUNTER — Inpatient Hospital Stay (HOSPITAL_COMMUNITY)
Admission: EM | Admit: 2024-08-26 | Discharge: 2024-08-30 | DRG: 871 | Disposition: A | Discharge status: Home / Self Care | Source: Ambulatory Visit | Attending: Internal Medicine | Admitting: Internal Medicine

## 2024-08-26 ENCOUNTER — Encounter: Payer: Self-pay | Admitting: Family Medicine

## 2024-08-26 ENCOUNTER — Other Ambulatory Visit: Payer: Self-pay

## 2024-08-26 VITALS — BP 78/42 | HR 103 | Temp 98.2°F | Ht 72.0 in | Wt 204.3 lb

## 2024-08-26 DIAGNOSIS — Z1152 Encounter for screening for COVID-19: Secondary | ICD-10-CM

## 2024-08-26 DIAGNOSIS — Z8249 Family history of ischemic heart disease and other diseases of the circulatory system: Secondary | ICD-10-CM | POA: Diagnosis not present

## 2024-08-26 DIAGNOSIS — N170 Acute kidney failure with tubular necrosis: Secondary | ICD-10-CM | POA: Diagnosis present

## 2024-08-26 DIAGNOSIS — J9811 Atelectasis: Secondary | ICD-10-CM | POA: Diagnosis not present

## 2024-08-26 DIAGNOSIS — R0989 Other specified symptoms and signs involving the circulatory and respiratory systems: Secondary | ICD-10-CM | POA: Diagnosis not present

## 2024-08-26 DIAGNOSIS — Z87891 Personal history of nicotine dependence: Secondary | ICD-10-CM | POA: Diagnosis not present

## 2024-08-26 DIAGNOSIS — J189 Pneumonia, unspecified organism: Secondary | ICD-10-CM

## 2024-08-26 DIAGNOSIS — R6520 Severe sepsis without septic shock: Secondary | ICD-10-CM | POA: Diagnosis present

## 2024-08-26 DIAGNOSIS — E860 Dehydration: Secondary | ICD-10-CM | POA: Diagnosis present

## 2024-08-26 DIAGNOSIS — Z8701 Personal history of pneumonia (recurrent): Secondary | ICD-10-CM

## 2024-08-26 DIAGNOSIS — K219 Gastro-esophageal reflux disease without esophagitis: Secondary | ICD-10-CM | POA: Diagnosis present

## 2024-08-26 DIAGNOSIS — D849 Immunodeficiency, unspecified: Secondary | ICD-10-CM | POA: Diagnosis present

## 2024-08-26 DIAGNOSIS — Z79620 Long term (current) use of immunosuppressive biologic: Secondary | ICD-10-CM | POA: Diagnosis not present

## 2024-08-26 DIAGNOSIS — M069 Rheumatoid arthritis, unspecified: Secondary | ICD-10-CM | POA: Diagnosis present

## 2024-08-26 DIAGNOSIS — Z7951 Long term (current) use of inhaled steroids: Secondary | ICD-10-CM

## 2024-08-26 DIAGNOSIS — R0600 Dyspnea, unspecified: Secondary | ICD-10-CM | POA: Diagnosis not present

## 2024-08-26 DIAGNOSIS — I10 Essential (primary) hypertension: Secondary | ICD-10-CM | POA: Diagnosis present

## 2024-08-26 DIAGNOSIS — J479 Bronchiectasis, uncomplicated: Secondary | ICD-10-CM | POA: Diagnosis present

## 2024-08-26 DIAGNOSIS — J90 Pleural effusion, not elsewhere classified: Secondary | ICD-10-CM | POA: Diagnosis not present

## 2024-08-26 DIAGNOSIS — A419 Sepsis, unspecified organism: Principal | ICD-10-CM | POA: Diagnosis present

## 2024-08-26 DIAGNOSIS — Z79899 Other long term (current) drug therapy: Secondary | ICD-10-CM

## 2024-08-26 DIAGNOSIS — Z7952 Long term (current) use of systemic steroids: Secondary | ICD-10-CM | POA: Diagnosis not present

## 2024-08-26 DIAGNOSIS — J168 Pneumonia due to other specified infectious organisms: Secondary | ICD-10-CM | POA: Diagnosis not present

## 2024-08-26 DIAGNOSIS — Z85820 Personal history of malignant melanoma of skin: Secondary | ICD-10-CM | POA: Diagnosis not present

## 2024-08-26 DIAGNOSIS — E781 Pure hyperglyceridemia: Secondary | ICD-10-CM | POA: Diagnosis not present

## 2024-08-26 DIAGNOSIS — R000 Tachycardia, unspecified: Secondary | ICD-10-CM | POA: Diagnosis not present

## 2024-08-26 DIAGNOSIS — Z79631 Long term (current) use of antimetabolite agent: Secondary | ICD-10-CM

## 2024-08-26 DIAGNOSIS — R918 Other nonspecific abnormal finding of lung field: Secondary | ICD-10-CM | POA: Diagnosis not present

## 2024-08-26 LAB — COMPREHENSIVE METABOLIC PANEL WITH GFR
ALT: 12 U/L (ref 0–44)
AST: 43 U/L — ABNORMAL HIGH (ref 15–41)
Albumin: 3.6 g/dL (ref 3.5–5.0)
Alkaline Phosphatase: 109 U/L (ref 38–126)
Anion gap: 14 (ref 5–15)
BUN: 39 mg/dL — ABNORMAL HIGH (ref 8–23)
CO2: 22 mmol/L (ref 22–32)
Calcium: 10.6 mg/dL — ABNORMAL HIGH (ref 8.9–10.3)
Chloride: 102 mmol/L (ref 98–111)
Creatinine, Ser: 1.88 mg/dL — ABNORMAL HIGH (ref 0.61–1.24)
GFR, Estimated: 39 mL/min — ABNORMAL LOW (ref >60)
Glucose, Bld: 94 mg/dL (ref 70–99)
Potassium: 5.1 mmol/L (ref 3.5–5.1)
Sodium: 138 mmol/L (ref 135–145)
Total Bilirubin: 0.7 mg/dL (ref 0.0–1.2)
Total Protein: 7.6 g/dL (ref 6.5–8.1)

## 2024-08-26 LAB — CBC WITH DIFFERENTIAL/PLATELET
Abs Immature Granulocytes: 0.19 K/uL — ABNORMAL HIGH (ref 0.00–0.07)
Basophils Absolute: 0.1 K/uL (ref 0.0–0.1)
Basophils Relative: 0 %
Eosinophils Absolute: 0.2 K/uL (ref 0.0–0.5)
Eosinophils Relative: 1 %
HCT: 37.6 % — ABNORMAL LOW (ref 39.0–52.0)
Hemoglobin: 11.8 g/dL — ABNORMAL LOW (ref 13.0–17.0)
Immature Granulocytes: 1 %
Lymphocytes Relative: 10 %
Lymphs Abs: 1.9 K/uL (ref 0.7–4.0)
MCH: 27.8 pg (ref 26.0–34.0)
MCHC: 31.4 g/dL (ref 30.0–36.0)
MCV: 88.7 fL (ref 80.0–100.0)
Monocytes Absolute: 1.5 K/uL — ABNORMAL HIGH (ref 0.1–1.0)
Monocytes Relative: 8 %
Neutro Abs: 15.6 K/uL — ABNORMAL HIGH (ref 1.7–7.7)
Neutrophils Relative %: 80 %
Platelets: 411 K/uL — ABNORMAL HIGH (ref 150–400)
RBC: 4.24 MIL/uL (ref 4.22–5.81)
RDW: 14.8 % (ref 11.5–15.5)
WBC: 19.4 K/uL — ABNORMAL HIGH (ref 4.0–10.5)
nRBC: 0 % (ref 0.0–0.2)

## 2024-08-26 LAB — TROPONIN T, HIGH SENSITIVITY
Troponin T High Sensitivity: 25 ng/L — ABNORMAL HIGH (ref 0–19)
Troponin T High Sensitivity: 21 ng/L — ABNORMAL HIGH (ref 0–19)

## 2024-08-26 LAB — URINALYSIS, W/ REFLEX TO CULTURE (INFECTION SUSPECTED)
Bacteria, UA: NONE SEEN
Bilirubin Urine: NEGATIVE
Glucose, UA: NEGATIVE mg/dL
Hgb urine dipstick: NEGATIVE
Ketones, ur: 5 mg/dL — AB
Leukocytes,Ua: NEGATIVE
Nitrite: NEGATIVE
Protein, ur: NEGATIVE mg/dL
Specific Gravity, Urine: 1.027 (ref 1.005–1.030)
pH: 5 (ref 5.0–8.0)

## 2024-08-26 LAB — RESP PANEL BY RT-PCR (RSV, FLU A&B, COVID)  RVPGX2
Influenza A by PCR: NEGATIVE
Influenza B by PCR: NEGATIVE
Resp Syncytial Virus by PCR: NEGATIVE
SARS Coronavirus 2 by RT PCR: NEGATIVE

## 2024-08-26 LAB — I-STAT CG4 LACTIC ACID, ED: Lactic Acid, Venous: 1 mmol/L (ref 0.5–1.9)

## 2024-08-26 LAB — PROTIME-INR
INR: 1.2 (ref 0.8–1.2)
Prothrombin Time: 15.4 s — ABNORMAL HIGH (ref 11.4–15.2)

## 2024-08-26 LAB — PRO BRAIN NATRIURETIC PEPTIDE: Pro Brain Natriuretic Peptide: 155 pg/mL (ref <300.0)

## 2024-08-26 MED ORDER — FOLIC ACID 1 MG PO TABS
1.0000 mg | ORAL_TABLET | Freq: Every day | ORAL | Status: DC
  Administered 2024-08-27 – 2024-08-30 (×4): 1 mg via ORAL
  Filled 2024-08-26 (×4): qty 1

## 2024-08-26 MED ORDER — ATORVASTATIN CALCIUM 40 MG PO TABS
40.0000 mg | ORAL_TABLET | Freq: Every day | ORAL | Status: DC
  Administered 2024-08-27 – 2024-08-30 (×4): 40 mg via ORAL
  Filled 2024-08-26 (×4): qty 1

## 2024-08-26 MED ORDER — SODIUM CHLORIDE 0.9 % IV SOLN
2.0000 g | INTRAVENOUS | Status: DC
  Administered 2024-08-27 – 2024-08-29 (×3): 2 g via INTRAVENOUS
  Filled 2024-08-26 (×3): qty 20

## 2024-08-26 MED ORDER — SODIUM CHLORIDE 0.9 % IV SOLN
500.0000 mg | Freq: Once | INTRAVENOUS | Status: AC
  Administered 2024-08-26: 500 mg via INTRAVENOUS
  Filled 2024-08-26: qty 5

## 2024-08-26 MED ORDER — ACETAMINOPHEN 650 MG RE SUPP
650.0000 mg | Freq: Four times a day (QID) | RECTAL | Status: DC | PRN
Start: 2024-08-26 — End: 2024-08-30

## 2024-08-26 MED ORDER — ARFORMOTEROL TARTRATE 15 MCG/2ML IN NEBU
15.0000 ug | INHALATION_SOLUTION | Freq: Two times a day (BID) | RESPIRATORY_TRACT | Status: DC
  Administered 2024-08-26 – 2024-08-30 (×8): 15 ug via RESPIRATORY_TRACT
  Filled 2024-08-26 (×8): qty 2

## 2024-08-26 MED ORDER — BUDESONIDE 0.25 MG/2ML IN SUSP
0.2500 mg | Freq: Two times a day (BID) | RESPIRATORY_TRACT | Status: DC
  Administered 2024-08-26 – 2024-08-30 (×8): 0.25 mg via RESPIRATORY_TRACT
  Filled 2024-08-26 (×8): qty 2

## 2024-08-26 MED ORDER — ALBUTEROL SULFATE (2.5 MG/3ML) 0.083% IN NEBU: 2.5000 mg | INHALATION_SOLUTION | RESPIRATORY_TRACT | Status: DC | PRN

## 2024-08-26 MED ORDER — IPRATROPIUM-ALBUTEROL 0.5-2.5 (3) MG/3ML IN SOLN
3.0000 mL | Freq: Once | RESPIRATORY_TRACT | Status: AC
  Administered 2024-08-26: 3 mL via RESPIRATORY_TRACT
  Filled 2024-08-26: qty 3

## 2024-08-26 MED ORDER — AZITHROMYCIN 250 MG PO TABS
500.0000 mg | ORAL_TABLET | Freq: Every day | ORAL | Status: AC
  Administered 2024-08-27 – 2024-08-30 (×4): 500 mg via ORAL
  Filled 2024-08-26 (×4): qty 2

## 2024-08-26 MED ORDER — LACTATED RINGERS IV BOLUS (SEPSIS)
1000.0000 mL | Freq: Once | INTRAVENOUS | Status: AC
  Administered 2024-08-26: 1000 mL via INTRAVENOUS

## 2024-08-26 MED ORDER — IPRATROPIUM-ALBUTEROL 0.5-2.5 (3) MG/3ML IN SOLN
3.0000 mL | Freq: Three times a day (TID) | RESPIRATORY_TRACT | Status: DC
  Administered 2024-08-27 – 2024-08-30 (×10): 3 mL via RESPIRATORY_TRACT
  Filled 2024-08-26 (×10): qty 3

## 2024-08-26 MED ORDER — OXYCODONE HCL 5 MG PO TABS: 5.0000 mg | ORAL_TABLET | ORAL | Status: DC | PRN

## 2024-08-26 MED ORDER — PREDNISONE 50 MG PO TABS
50.0000 mg | ORAL_TABLET | Freq: Every day | ORAL | Status: DC
  Administered 2024-08-27 – 2024-08-29 (×3): 50 mg via ORAL
  Filled 2024-08-26 (×3): qty 1

## 2024-08-26 MED ORDER — ONDANSETRON HCL 4 MG PO TABS
4.0000 mg | ORAL_TABLET | Freq: Four times a day (QID) | ORAL | Status: DC | PRN
  Administered 2024-08-27: 4 mg via ORAL
  Filled 2024-08-26: qty 1

## 2024-08-26 MED ORDER — HYDROCHLOROTHIAZIDE 25 MG PO TABS
25.0000 mg | ORAL_TABLET | Freq: Every day | ORAL | Status: DC
  Administered 2024-08-27: 25 mg via ORAL
  Filled 2024-08-26: qty 1

## 2024-08-26 MED ORDER — LACTATED RINGERS IV BOLUS (SEPSIS)
1000.0000 mL | Freq: Once | INTRAVENOUS | Status: AC
Start: 2024-08-26 — End: 2024-08-26
  Administered 2024-08-26: 1000 mL via INTRAVENOUS

## 2024-08-26 MED ORDER — ACETAMINOPHEN 325 MG PO TABS
650.0000 mg | ORAL_TABLET | Freq: Four times a day (QID) | ORAL | Status: DC | PRN
  Administered 2024-08-27 (×2): 650 mg via ORAL
  Filled 2024-08-26 (×2): qty 2

## 2024-08-26 MED ORDER — ONDANSETRON HCL 4 MG/2ML IJ SOLN: 4.0000 mg | Freq: Four times a day (QID) | INTRAMUSCULAR | Status: DC | PRN

## 2024-08-26 MED ORDER — ENOXAPARIN SODIUM 40 MG/0.4ML IJ SOSY
40.0000 mg | PREFILLED_SYRINGE | INTRAMUSCULAR | Status: DC
  Administered 2024-08-26 – 2024-08-29 (×4): 40 mg via SUBCUTANEOUS
  Filled 2024-08-26 (×4): qty 0.4

## 2024-08-26 MED ORDER — SODIUM CHLORIDE 0.9 % IV SOLN
2.0000 g | Freq: Once | INTRAVENOUS | Status: AC
  Administered 2024-08-26: 2 g via INTRAVENOUS
  Filled 2024-08-26: qty 20

## 2024-08-26 MED ORDER — LISINOPRIL 20 MG PO TABS
20.0000 mg | ORAL_TABLET | Freq: Every day | ORAL | Status: DC
  Administered 2024-08-27: 20 mg via ORAL
  Filled 2024-08-26: qty 1

## 2024-08-26 MED ORDER — IPRATROPIUM-ALBUTEROL 0.5-2.5 (3) MG/3ML IN SOLN
3.0000 mL | Freq: Four times a day (QID) | RESPIRATORY_TRACT | Status: DC
  Administered 2024-08-26: 3 mL via RESPIRATORY_TRACT
  Filled 2024-08-26: qty 3

## 2024-08-26 MED ORDER — LISINOPRIL-HYDROCHLOROTHIAZIDE 20-25 MG PO TABS: 1.0000 | ORAL_TABLET | Freq: Every day | ORAL | Status: DC

## 2024-08-26 MED ORDER — ACETAMINOPHEN 325 MG PO TABS
650.0000 mg | ORAL_TABLET | Freq: Once | ORAL | Status: AC
  Administered 2024-08-26: 650 mg via ORAL
  Filled 2024-08-26: qty 2

## 2024-08-26 NOTE — Sepsis Progress Note (Signed)
 Code Sepsis protocol being monitored by eLink.

## 2024-08-26 NOTE — ED Provider Notes (Signed)
 [place] EMERGENCY DEPARTMENT AT [facility] Provider Note   CSN: [id number] Arrival date & time: 08/26/24  1500     Patient presents with: Fever   Todd Valdez is a 63 y.o. male.   63 year old male presents for evaluation of fever and cough.  Patient was here about a month ago for pneumonia.  Was following up with his primary care doctor and sent back to the ER for continued cough, low-grade fever and low blood pressure.  Patient states he started feeling bad a few days ago.  States he had fevers at home and felt very rundown.  Admits to increased cough again. Patient denies any other symptoms or concerns.    Fever Associated symptoms: cough   Associated symptoms: no chest pain, no chills, no dysuria, no ear pain, no rash, no sore throat and no vomiting        Prior to Admission medications   Medication Sig Start Date End Date Taking? Authorizing Provider  Abatacept  (ORENCIA  CLICKJECT) 125 MG/ML SOAJ Inject 125mg  Subcutaneous once a week Patient taking differently: Inject 125 mg into the skin every Friday. 07/22/24  Yes   acetaminophen  (TYLENOL ) 325 MG tablet Take 325-650 mg by mouth daily as needed for mild pain (pain score 1-3) or moderate pain (pain score 4-6).   Yes [provider]  albuterol  (VENTOLIN  HFA) 108 (90 Base) MCG/ACT inhaler Inhale 2 puffs into the lungs every 6 (six) hours as needed for wheezing or shortness of breath. 05/07/24  Yes Kara Dorn NOVAK, MD  atorvastatin  (LIPITOR) 40 MG tablet Take 40 mg by mouth daily.   Yes [provider]  cyclobenzaprine  (FLEXERIL ) 10 MG tablet Take 1 tablet (10 mg total) by mouth 3 (three) times daily as needed for muscle spasms. 07/28/24  Yes Alvia Bring, DO  doxycycline  (ADOXA) 50 MG tablet Take 50 mg by mouth daily with breakfast.   Yes [provider]  esomeprazole  (NEXIUM ) 40 MG capsule Take 1 capsule (40 mg total) by mouth daily at 12 noon. Patient taking differently: Take 40 mg by  mouth daily before breakfast. 06/18/24 06/18/25 Yes Crain, Whitney L, PA  Evolocumab  (REPATHA  SURECLICK) 140 MG/ML SOAJ Inject 140 mg into the skin every 14 (fourteen) days. 06/25/24  Yes Alvia Bring, DO  fenofibrate  160 MG tablet Take 160 mg by mouth daily.   Yes [provider]  folic acid  (FOLVITE ) 1 MG tablet Take 1 tablet (1 mg total) by mouth daily. 03/31/24  Yes   guaiFENesin  (MUCINEX ) 600 MG 12 hr tablet Take 600 mg by mouth 2 (two) times daily as needed for to loosen phlegm or cough.   Yes [provider]  HYDROcodone  bit-homatropine (HYDROMET) 5-1.5 MG/5ML syrup Take 5 mLs by mouth every 6 (six) hours as needed for cough. 08/12/24  Yes Alvia Bring, DO  lisinopril -hydrochlorothiazide  (ZESTORETIC ) 20-25 MG tablet Take 1 tablet by mouth daily. 08/12/24 08/12/25 Yes Alvia Bring, DO  niacin  (,VITAMIN B3,) 500 MG tablet Take 1 tablet (500 mg total) by mouth at bedtime. 08/21/23  Yes Curtis Debby PARAS, MD  omega-3 acid ethyl esters (LOVAZA ) 1 g capsule Take 1 g by mouth 2 (two) times daily.   Yes [provider]  ondansetron  (ZOFRAN -ODT) 8 MG disintegrating tablet Take 1 tablet (8 mg total) by mouth every 8 (eight) hours as needed for nausea. Patient taking differently: Take 8 mg by mouth every 8 (eight) hours as needed for nausea (dissolve orally). 12/04/23  Yes Curtis Debby PARAS, MD  predniSONE  (  DELTASONE ) 5 MG tablet Take 5 mg by mouth daily with breakfast.   Yes [provider]  sodium chloride  HYPERTONIC 3 % nebulizer solution Take by nebulization 2 (two) times daily as needed. Patient taking differently: Take 4 mLs by nebulization 2 (two) times daily as needed for cough. 05/07/24  Yes Kara Dorn NOVAK, MD  TRELEGY ELLIPTA  200-62.5-25 MCG/ACT AEPB Inhale 1 puff into the lungs in the morning.   Yes [provider]  methotrexate  250 MG/10ML injection Inject 1 mL (25 mg total) into the muscle once a week. Patient not taking: No sig  reported 12/10/23     predniSONE  (DELTASONE ) 5 MG tablet Take 8 tablets (40 mg total) by mouth daily with breakfast for 3 days, THEN 6 tablets (30 mg total) daily with breakfast for 3 days, THEN 4 tablets (20 mg total) daily with breakfast for 3 days, THEN 2 tablets (10 mg total) daily with breakfast for 3 days, THEN 1 tablet (5 mg total) daily with breakfast. Patient not taking: Reported on 08/26/2024 07/24/24 09/04/24  Sherrill Alejandro Donovan, DO    Allergies: Patient has no known allergies.    Review of Systems  Constitutional:  Positive for fever. Negative for chills.  HENT:  Negative for ear pain and sore throat.   Eyes:  Negative for pain and visual disturbance.  Respiratory:  Positive for cough and shortness of breath.   Cardiovascular:  Negative for chest pain and palpitations.  Gastrointestinal:  Negative for abdominal pain and vomiting.  Genitourinary:  Negative for dysuria and hematuria.  Musculoskeletal:  Negative for arthralgias and back pain.  Skin:  Negative for color change and rash.  Neurological:  Negative for seizures and syncope.  All other systems reviewed and are negative.   Updated Vital Signs BP (!) 125/57 (BP Location: Right Arm)   Pulse 90   Temp 98.6 F (37 C) (Oral)   Resp 16   Ht 6' (1.829 m)   Wt 93.9 kg   SpO2 98%   BMI 28.08 kg/m   Physical Exam Vitals and nursing note reviewed.  Constitutional:      General: He is not in acute distress.    Appearance: Normal appearance. He is well-developed. He is ill-appearing.  HENT:     Head: Normocephalic and atraumatic.  Eyes:     Conjunctiva/sclera: Conjunctivae normal.  Cardiovascular:     Rate and Rhythm: Regular rhythm. Tachycardia present.     Heart sounds: No murmur heard. Pulmonary:     Effort: No respiratory distress.     Breath sounds: Rhonchi present.     Comments: tacahypneic Abdominal:     Palpations: Abdomen is soft.     Tenderness: There is no abdominal tenderness.  Musculoskeletal:         General: No swelling.     Cervical back: Neck supple.  Skin:    General: Skin is warm and dry.     Capillary Refill: Capillary refill takes less than 2 seconds.  Neurological:     Mental Status: He is alert.  Psychiatric:        Mood and Affect: Mood normal.     (all labs ordered are listed, but only abnormal results are displayed) Labs Reviewed  COMPREHENSIVE METABOLIC PANEL WITH GFR - Abnormal; Notable for the following components:      Result Value   BUN 39 (*)    Creatinine, Ser 1.88 (*)    Calcium  10.6 (*)    AST 43 (*)  GFR, Estimated 39 (*)    All other components within normal limits  CBC WITH DIFFERENTIAL/PLATELET - Abnormal; Notable for the following components:   WBC 19.4 (*)    Hemoglobin 11.8 (*)    HCT 37.6 (*)    Platelets 411 (*)    Neutro Abs 15.6 (*)    Monocytes Absolute 1.5 (*)    Abs Immature Granulocytes 0.19 (*)    All other components within normal limits  PROTIME-INR - Abnormal; Notable for the following components:   Prothrombin Time 15.4 (*)    All other components within normal limits  URINALYSIS, W/ REFLEX TO CULTURE (INFECTION SUSPECTED) - Abnormal; Notable for the following components:   Color, Urine AMBER (*)    APPearance HAZY (*)    Ketones, ur 5 (*)    All other components within normal limits  TROPONIN T, HIGH SENSITIVITY - Abnormal; Notable for the following components:   Troponin T High Sensitivity 25 (*)    All other components within normal limits  TROPONIN T, HIGH SENSITIVITY - Abnormal; Notable for the following components:   Troponin T High Sensitivity 21 (*)    All other components within normal limits  CULTURE, BLOOD (ROUTINE X 2)  CULTURE, BLOOD (ROUTINE X 2)  RESP PANEL BY RT-PCR (RSV, FLU A&B, COVID)  RVPGX2  PRO BRAIN NATRIURETIC PEPTIDE  I-STAT CG4 LACTIC ACID, ED  I-STAT CG4 LACTIC ACID, ED    EKG: EKG Interpretation Date/Time:  Tuesday August 26 2024 15:54:27 EST Ventricular Rate:  95 PR  Interval:  164 QRS Duration:  85 QT Interval:  315 QTC Calculation: 396 R Axis:   2  Text Interpretation: Sinus rhythm Low voltage, precordial leads Abnormal R-wave progression, early transition Compared with prior EKG from 07/21/2024 Confirmed by Gennaro Bouchard ([id number]) on 08/26/2024 4:39:08 PM  Radiology: [facility] Chest Port 1 View if patient is in a treatment room. Result Date: 08/26/2024 CLINICAL DATA:  Fever and generally unwell feeling with exertional dyspnea. EXAM: PORTABLE CHEST 1 VIEW COMPARISON:  July 23, 2024 FINDINGS: The heart size and mediastinal contours are within normal limits. Low lung volumes are noted. Mild to moderate severity areas of atelectasis and/or infiltrate are seen within the bilateral lung bases, right greater than left. This is mildly increased in severity when compared to the prior study. There is a small right pleural effusion. No pneumothorax is identified. The visualized skeletal structures are unremarkable. IMPRESSION: 1. Mild to moderate severity bibasilar atelectasis and/or infiltrate, right greater than left. 2. Small right pleural effusion. Electronically Signed   By: Suzen Dials M.D.   On: 08/26/2024 17:10     .Critical Care  Performed by: Gennaro Bouchard CROME, DO Authorized by: Gennaro Bouchard CROME, DO   Critical care provider statement:    Critical care time (minutes):  30   Critical care time was exclusive of:  Separately billable procedures and treating other patients and teaching time   Critical care was necessary to treat or prevent imminent or life-threatening deterioration of the following conditions:  Circulatory failure, dehydration and sepsis   Critical care was time spent personally by me on the following activities:  Development of treatment plan with patient or surrogate, discussions with consultants, evaluation of patient's response to treatment, examination of patient, ordering and review of laboratory studies, ordering and review of  radiographic studies, ordering and performing treatments and interventions, pulse oximetry, re-evaluation of patient's condition and review of old charts   Care discussed with: admitting provider  Medications Ordered in the ED  atorvastatin  (LIPITOR) tablet 40 mg (has no administration in time range)  folic acid  (FOLVITE ) tablet 1 mg (has no administration in time range)  enoxaparin  (LOVENOX ) injection 40 mg (40 mg Subcutaneous Given 08/26/24 2143)  acetaminophen  (TYLENOL ) tablet 650 mg (has no administration in time range)    Or  acetaminophen  (TYLENOL ) suppository 650 mg (has no administration in time range)  oxyCODONE  (Oxy IR/ROXICODONE ) immediate release tablet 5 mg (has no administration in time range)  ondansetron  (ZOFRAN ) tablet 4 mg (has no administration in time range)    Or  ondansetron  (ZOFRAN ) injection 4 mg (has no administration in time range)  cefTRIAXone  (ROCEPHIN ) 2 g in sodium chloride  0.9 % 100 mL IVPB (has no administration in time range)  azithromycin  (ZITHROMAX ) tablet 500 mg (has no administration in time range)  budesonide (PULMICORT) nebulizer solution 0.25 mg (0.25 mg Nebulization Given 08/26/24 2132)  arformoterol (BROVANA) nebulizer solution 15 mcg (15 mcg Nebulization Given 08/26/24 2133)  predniSONE  (DELTASONE ) tablet 50 mg (has no administration in time range)  lisinopril  (ZESTRIL ) tablet 20 mg (has no administration in time range)    And  hydrochlorothiazide  (HYDRODIURIL ) tablet 25 mg (has no administration in time range)  ipratropium-albuterol  (DUONEB) 0.5-2.5 (3) MG/3ML nebulizer solution 3 mL (has no administration in time range)  albuterol  (PROVENTIL ) (2.5 MG/3ML) 0.083% nebulizer solution 2.5 mg (has no administration in time range)  lactated ringers  bolus 1,000 mL (0 mLs Intravenous Stopped 08/26/24 1715)    And  lactated ringers  bolus 1,000 mL (0 mLs Intravenous Stopped 08/26/24 1715)    And  lactated ringers  bolus 1,000 mL (0 mLs Intravenous  Stopped 08/26/24 1849)  cefTRIAXone  (ROCEPHIN ) 2 g in sodium chloride  0.9 % 100 mL IVPB (0 g Intravenous Stopped 08/26/24 1658)  azithromycin  (ZITHROMAX ) 500 mg in sodium chloride  0.9 % 250 mL IVPB (0 mg Intravenous Stopped 08/26/24 1747)  ipratropium-albuterol  (DUONEB) 0.5-2.5 (3) MG/3ML nebulizer solution 3 mL (3 mLs Nebulization Given 08/26/24 1648)  acetaminophen  (TYLENOL ) tablet 650 mg (650 mg Oral Given 08/26/24 1817)                                    Medical Decision Making Cardiac monitor interpretation: Sinus rhythm, no ectopy  Patient here for right sided rib pain cough fevers and likely sepsis.  He recently had pneumonia and was admitted that time.  Given 30 cc/kg of IV fluids as well as started on Rocephin  and azithromycin  for pneumonia.  Given Tylenol  for fever as well.  Patient was tachycardic and mildly hypotensive.  Discussed patient's case with hospitalist and patient will be admitted for further workup and management.  Patient and family at bedside are agreeable with plan for admission  Problems Addressed: Pneumonia of right lung due to infectious organism, unspecified part of lung: acute illness or injury that poses a threat to life or bodily functions Sepsis, due to unspecified organism, unspecified whether acute organ dysfunction present [facility]): acute illness or injury that poses a threat to life or bodily functions  Amount and/or Complexity of Data Reviewed External Data Reviewed: notes.    Details: Prior hospital.  Records reviewed and patient was recently admitted few weeks ago for pneumonia Labs: ordered. Decision-making details documented in ED Course.    Details: Ordered and reviewed by me and shows leukocytosis Radiology: ordered and independent interpretation performed. Decision-making details documented in ED Course.    Details: Ordered  and interpreted by me independently of radiology Chest x-ray: Shows evidence of pneumonia ECG/medicine tests: ordered and  independent interpretation performed. Decision-making details documented in ED Course.    Details: Ordered and interpreted by me in the absence of cardiology and shows sinus tachycardia, no STEMI Discussion of management or test interpretation with external provider(s): Dr. Dena -hospitalist-I spoke with him regarding patient's case he will admit the patient for further workup and management  Risk OTC drugs. Prescription drug management. Drug therapy requiring intensive monitoring for toxicity. Decision regarding hospitalization.     Final diagnoses:  Sepsis, due to unspecified organism, unspecified whether acute organ dysfunction present [facility])  Pneumonia of right lung due to infectious organism, unspecified part of lung    ED Discharge Orders     None          Gennaro Duwaine CROME, DO 08/26/24 2324

## 2024-08-26 NOTE — ED Triage Notes (Signed)
 Pt diagnosed with PNA 3 weeks-1 months ago. Pt had follow up with PCP today and blood pressure was low, pt has had fever for 5 days. Denies pain, c/o general unwell feeling, exertional dyspnea

## 2024-08-26 NOTE — Progress Notes (Unsigned)
 Todd Valdez - 63 y.o. male MRN [id number]  Date of birth: May 28, 1961  Subjective No chief complaint on file.   HPI  No Known Allergies  Past Medical History:  Diagnosis Date   Arthritis    Cancer (HCC)    Melanoma on  back   Diverticulitis    GERD (gastroesophageal reflux disease)    High triglycerides    History of abscess of skin and subcutaneous tissue    right forearm, chin   Hypertension    RA (rheumatoid arthritis) (HCC)    Reflux esophagitis    Right inguinal hernia    Sleep apnea    no cpap    Past Surgical History:  Procedure Laterality Date   bilateral feet reconstruction  10/2000   COLONOSCOPY     CYSTOSCOPY W/ URETERAL STENT PLACEMENT Left 11/01/2023   Procedure: CYSTOSCOPY WITH RETROGRADE PYELOGRAM/URETERAL STENT PLACEMENT;  Surgeon: Nieves Cough, MD;  Location: [facility];  Service: Urology;  Laterality: Left;   feet surgery     INGUINAL HERNIA REPAIR Right 11/27/2017   Procedure: OPEN RIGHT INGUINAL HERNIA REPAIR;  Surgeon: Eletha Boas, MD;  Location: [facility];  Service: General;  Laterality: Right;   INSERTION OF MESH Right 11/27/2017   Procedure: INSERTION OF MESH;  Surgeon: Eletha Boas, MD;  Location: [facility] [place];  Service: General;  Laterality: Right;   MELANOMA EXCISION     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   Procedure: Right transmetatarsal amputation and heelcord lengthening;  Surgeon: Kit Rush, MD;  Location: [facility];  Service: Orthopedics;  Laterality: Right;   UPPER GI ENDOSCOPY  12/2011    Social History   Socioeconomic History   Marital status: Married    Spouse name: Not on file   Number of children: Not on file   Years of education: Not on file   Highest education level: 12th grade  Occupational History   Not on file  Tobacco Use   Smoking status: Former    Current packs/day: 0.00    Types: Cigarettes    Quit date: 02/07/1991    Years since quitting: 33.5   Smokeless  tobacco: Former    Quit date: 08/14/2009  Vaping Use   Vaping status: Never Used  Substance and Sexual Activity   Alcohol use: No   Drug use: No   Sexual activity: Yes    Partners: Female    Comment: married.   Other Topics Concern   Not on file  Social History Narrative   Not on file   Social Drivers of Health   Financial Resource Strain: Low Risk  (12/03/2023)   Overall Financial Resource Strain (CARDIA)    Difficulty of Paying Living Expenses: Not very hard  Food Insecurity: No Food Insecurity (07/21/2024)   Hunger Vital Sign    Worried About Running Out of Food in the Last Year: Never true    Ran Out of Food in the Last Year: Never true  Transportation Needs: No Transportation Needs (07/21/2024)   PRAPARE - Administrator, Civil Service (Medical): No    Lack of Transportation (Non-Medical): No  Physical Activity: Insufficiently Active (12/03/2023)   Exercise Vital Sign    Days of Exercise per Week: 2 days    Minutes of Exercise per Session: 20 min  Stress: No Stress Concern Present (12/03/2023)   [employer] of Occupational Health - Occupational Stress Questionnaire    Feeling of Stress : Not at all  Social Connections: Moderately Integrated (  12/03/2023)   Social Connection and Isolation Panel    Frequency of Communication with Friends and Family: Once a week    Frequency of Social Gatherings with Friends and Family: Once a week    Attends Religious Services: More than 4 times per year    Active Member of [employer] or Organizations: Yes    Attends Banker Meetings: 1 to 4 times per year    Marital Status: Married    Family History  Problem Relation Age of Onset   Hypertension Mother    Hypertension Father     Health Maintenance  Topic Date Due   Medicare Annual Wellness (AWV)  Never done   Zoster Vaccines- Shingrix (1 of 2) Never done   Influenza Vaccine  01/06/2025 (Originally 05/09/2024)   Pneumococcal Vaccine: 50+ Years (3 of 3 - PCV20  or PCV21) 01/29/2025   DTaP/Tdap/Td (2 - Td or Tdap) 01/03/2027   Colonoscopy  10/27/2030   Hepatitis C Screening  Completed   HIV Screening  Completed   Hepatitis B Vaccines 19-59 Average Risk  Aged Out   Meningococcal B Vaccine  Aged Out   COVID-19 Vaccine  Discontinued     ----------------------------------------------------------------------------------------------------------------------------------------------------------------------------------------------------------------- Physical Exam There were no vitals taken for this visit.  Physical Exam  ------------------------------------------------------------------------------------------------------------------------------------------------------------------------------------------------------------------- Assessment and Plan  No problem-specific Assessment & Plan notes found for this encounter.   No orders of the defined types were placed in this encounter.   No follow-ups on file.

## 2024-08-26 NOTE — H&P (Signed)
 History and Physical    Todd Valdez FMW:[id number] DOB: Apr 23, 1961 DOA: 08/26/2024  PCP: Alvia Bring, DO   Chief Complaint: fever  HPI: Todd Valdez is a 63 y.o. male with medical history significant of melanoma, GERD, hypertension, rheumatoid arthritis who presented to the emergency department with fever and cough.  Patient was recently admitted for similar complaints and was following up outpatient by his primary care doctor who had noted him to have a continued cough and fever.  He was referred to the ER where his found to be afebrile and hemodynamically stable.  Labs were obtained on presentation which demonstrated urinalysis negative for infection, creatinine 1.8 baseline around 1, AST 43, WBC 19.4, hemoglobin 11.8, platelets 411, INR 1.2, blood cultures negative.  Troponin 25, 21.  Chest x-ray showed severe atelectasis and infiltrate.  Patient was admitted for further workup.  He was started on ceftriaxone  and azithromycin  and fluids.  Of note patient was discharged on 10/15.  During this hospitalization he presented with fever.  CT scan showed consolidations and he was treated with empiric antibiotics.     Review of Systems: Review of Systems  Constitutional:  Positive for chills, fever and malaise/fatigue.  HENT: Negative.    Eyes: Negative.   Respiratory:  Positive for cough and shortness of breath.   Cardiovascular: Negative.   Gastrointestinal: Negative.   Genitourinary: Negative.   Musculoskeletal: Negative.   Skin: Negative.   Neurological: Negative.   Endo/Heme/Allergies: Negative.   Psychiatric/Behavioral: Negative.       As per HPI otherwise 10 point review of systems negative.   No Known Allergies  Past Medical History:  Diagnosis Date   Arthritis    Cancer (HCC)    Melanoma on  back   Diverticulitis    GERD (gastroesophageal reflux disease)    High triglycerides    History of abscess of skin and subcutaneous tissue    right forearm, chin   Hypertension     RA (rheumatoid arthritis) (HCC)    Reflux esophagitis    Right inguinal hernia    Sleep apnea    no cpap    Past Surgical History:  Procedure Laterality Date   bilateral feet reconstruction  10/2000   COLONOSCOPY     CYSTOSCOPY W/ URETERAL STENT PLACEMENT Left 11/01/2023   Procedure: CYSTOSCOPY WITH RETROGRADE PYELOGRAM/URETERAL STENT PLACEMENT;  Surgeon: Nieves Cough, MD;  Location: [facility];  Service: Urology;  Laterality: Left;   feet surgery     INGUINAL HERNIA REPAIR Right 11/27/2017   Procedure: OPEN RIGHT INGUINAL HERNIA REPAIR;  Surgeon: Eletha Boas, MD;  Location: [facility] [place];  Service: General;  Laterality: Right;   INSERTION OF MESH Right 11/27/2017   Procedure: INSERTION OF MESH;  Surgeon: Eletha Boas, MD;  Location: [facility];  Service: General;  Laterality: Right;   MELANOMA EXCISION     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   Procedure: Right transmetatarsal amputation and heelcord lengthening;  Surgeon: Kit Rush, MD;  Location: [facility];  Service: Orthopedics;  Laterality: Right;   UPPER GI ENDOSCOPY  12/2011     reports that he quit smoking about 33 years ago. His smoking use included cigarettes. He quit smokeless tobacco use about 15 years ago. He reports that he does not drink alcohol and does not use drugs.  Family History  Problem Relation Age of Onset   Hypertension Mother    Hypertension Father     Prior to Admission medications   Medication Sig Start  Date End Date Taking? Authorizing Provider  Abatacept  (ORENCIA  CLICKJECT) 125 MG/ML SOAJ Inject 125mg  Subcutaneous once a week 07/22/24     acetaminophen  (TYLENOL ) 325 MG tablet Take 650 mg by mouth daily as needed for mild pain (pain score 1-3) or moderate pain (pain score 4-6).    [provider]  albuterol  (VENTOLIN  HFA) 108 (90 Base) MCG/ACT inhaler Inhale 2 puffs into the lungs every 6 (six) hours as needed for wheezing or shortness of  breath. 05/07/24   Kara Dorn NOVAK, MD  atorvastatin  (LIPITOR) 40 MG tablet Take 40 mg by mouth daily.    [provider]  budesonide -glycopyrrolate -formoterol  (BREZTRI  AEROSPHERE) 160-9-4.8 MCG/ACT AERO inhaler Inhale 2 puffs into the lungs 2 (two) times daily.    [provider]  cyclobenzaprine  (FLEXERIL ) 10 MG tablet Take 1 tablet (10 mg total) by mouth 3 (three) times daily as needed for muscle spasms. 07/28/24   Alvia Bring, DO  esomeprazole  (NEXIUM ) 40 MG capsule Take 1 capsule (40 mg total) by mouth daily at 12 noon. 06/18/24 06/18/25  Crain, Whitney L, PA  Evolocumab  (REPATHA  SURECLICK) 140 MG/ML SOAJ Inject 140 mg into the skin every 14 (fourteen) days. 06/25/24   Alvia Bring, DO  folic acid  (FOLVITE ) 1 MG tablet Take 1 tablet (1 mg total) by mouth daily. 03/31/24     HYDROcodone  bit-homatropine (HYDROMET) 5-1.5 MG/5ML syrup Take 5 mLs by mouth every 6 (six) hours as needed for cough. 08/12/24   Alvia Bring, DO  lisinopril -hydrochlorothiazide  (ZESTORETIC ) 20-25 MG tablet Take 1 tablet by mouth daily. 08/12/24 08/12/25  Alvia Bring, DO  methotrexate  250 MG/10ML injection Inject 1 mL (25 mg total) into the muscle once a week. Patient not taking: Reported on 08/26/2024 12/10/23     niacin  (,VITAMIN B3,) 500 MG tablet Take 1 tablet (500 mg total) by mouth at bedtime. 08/21/23   Curtis Debby PARAS, MD  ondansetron  (ZOFRAN -ODT) 8 MG disintegrating tablet Take 1 tablet (8 mg total) by mouth every 8 (eight) hours as needed for nausea. 12/04/23   Curtis Debby PARAS, MD  predniSONE  (DELTASONE ) 5 MG tablet Take 8 tablets (40 mg total) by mouth daily with breakfast for 3 days, THEN 6 tablets (30 mg total) daily with breakfast for 3 days, THEN 4 tablets (20 mg total) daily with breakfast for 3 days, THEN 2 tablets (10 mg total) daily with breakfast for 3 days, THEN 1 tablet (5 mg total) daily with breakfast. 07/24/24 09/04/24  Sherrill Cable Latif, DO  sodium chloride   HYPERTONIC 3 % nebulizer solution Take by nebulization 2 (two) times daily as needed. Patient taking differently: Take 4 mLs by nebulization as needed for cough. 05/07/24   Kara Dorn NOVAK, MD    Physical Exam: Vitals:   08/26/24 1954 08/26/24 2000 08/26/24 2015 08/26/24 2039  BP:  (!) 97/57 100/62 (!) 125/57  Pulse:  84 83 90  Resp:    16  Temp: 98.4 F (36.9 C)   98.6 F (37 C)  TempSrc: Oral   Oral  SpO2:  93% 92% 94%   Physical Exam Constitutional:      Appearance: He is normal weight.  HENT:     Head: Normocephalic.     Nose: Nose normal.     Mouth/Throat:     Mouth: Mucous membranes are moist.     Pharynx: Oropharynx is clear.  Eyes:     Conjunctiva/sclera: Conjunctivae normal.     Pupils: Pupils are equal, round, and reactive to light.  Cardiovascular:  Rate and Rhythm: Normal rate and regular rhythm.     Pulses: Normal pulses.  Pulmonary:     Effort: Respiratory distress present.     Breath sounds: Normal breath sounds.  Abdominal:     General: Abdomen is flat. Bowel sounds are normal.  Musculoskeletal:        General: Normal range of motion.  Skin:    General: Skin is warm.     Capillary Refill: Capillary refill takes less than 2 seconds.  Neurological:     General: No focal deficit present.     Mental Status: He is alert. Mental status is at baseline.  Psychiatric:        Mood and Affect: Mood normal.        Labs on Admission: I have personally reviewed the patients's labs and imaging studies.  Assessment/Plan Principal Problem:   Community acquired pneumonia   # Bacterial commune acquired pneumonia # History of bronchiectasis/concern for COPD - Patient's hospital care exposure was over a month ago - Previous hospitalization a concern for COPD and follows with pulmonology outpatient  Plan: RT eval and treat Escalate home pred to 40 mg daily Continue ceftriaxone  and azithromycin  Scheduled DuoNebs Pulmicort and Brovana  # AKI-possibly  related to underlying kidney injury versus acute kidney injury in setting of sepsis.  Will monitor creatinine in morning.  # Rheumatoid arthritis-patient's immunosuppressants were held.  Will continue prednisone  40 mg daily.  #HLD- continue statin  #Htn- continue ACTZ, HCTZ   Admission status: Inpatient Telemetry  Certification: The appropriate patient status for this patient is INPATIENT. Inpatient status is judged to be reasonable and necessary in order to provide the required intensity of service to ensure the patient's safety. The patient's presenting symptoms, physical exam findings, and initial radiographic and laboratory data in the context of their chronic comorbidities is felt to place them at high risk for further clinical deterioration. Furthermore, it is not anticipated that the patient will be medically stable for discharge from the hospital within 2 midnights of admission.   * I certify that at the point of admission it is my clinical judgment that the patient will require inpatient hospital care spanning beyond 2 midnights from the point of admission due to high intensity of service, high risk for further deterioration and high frequency of surveillance required.DEWAINE Lamar Dess MD [facility] If 7PM-7AM, please contact night-coverage [email or web address]  08/26/2024, 8:51 PM

## 2024-08-27 ENCOUNTER — Other Ambulatory Visit (HOSPITAL_BASED_OUTPATIENT_CLINIC_OR_DEPARTMENT_OTHER): Payer: Self-pay

## 2024-08-27 ENCOUNTER — Other Ambulatory Visit (HOSPITAL_COMMUNITY): Payer: Self-pay

## 2024-08-27 ENCOUNTER — Telehealth (HOSPITAL_COMMUNITY): Payer: Self-pay | Admitting: Pharmacy Technician

## 2024-08-27 DIAGNOSIS — J189 Pneumonia, unspecified organism: Secondary | ICD-10-CM | POA: Diagnosis not present

## 2024-08-27 LAB — BASIC METABOLIC PANEL WITH GFR
Anion gap: 13 (ref 5–15)
BUN: 29 mg/dL — ABNORMAL HIGH (ref 8–23)
CO2: 23 mmol/L (ref 22–32)
Calcium: 10.4 mg/dL — ABNORMAL HIGH (ref 8.9–10.3)
Chloride: 103 mmol/L (ref 98–111)
Creatinine, Ser: 1.45 mg/dL — ABNORMAL HIGH (ref 0.61–1.24)
GFR, Estimated: 53 mL/min — ABNORMAL LOW (ref >60)
Glucose, Bld: 95 mg/dL (ref 70–99)
Potassium: 4.2 mmol/L (ref 3.5–5.1)
Sodium: 138 mmol/L (ref 135–145)

## 2024-08-27 MED ORDER — FENOFIBRATE 160 MG PO TABS
160.0000 mg | ORAL_TABLET | Freq: Every day | ORAL | Status: DC
  Administered 2024-08-28 – 2024-08-30 (×3): 160 mg via ORAL
  Filled 2024-08-27 (×3): qty 1

## 2024-08-27 MED ORDER — PANTOPRAZOLE SODIUM 40 MG PO TBEC
80.0000 mg | DELAYED_RELEASE_TABLET | Freq: Every day | ORAL | Status: DC
  Administered 2024-08-28 – 2024-08-30 (×3): 80 mg via ORAL
  Filled 2024-08-27 (×3): qty 2

## 2024-08-27 MED ORDER — MELATONIN 5 MG PO TABS
5.0000 mg | ORAL_TABLET | Freq: Every evening | ORAL | Status: AC | PRN
  Administered 2024-08-27 – 2024-08-29 (×3): 5 mg via ORAL
  Filled 2024-08-27 (×3): qty 1

## 2024-08-27 NOTE — Progress Notes (Addendum)
 PROGRESS NOTE    Todd Valdez  FMW:[id number] DOB: 02/28/1961 DOA: 08/26/2024 PCP: Alvia Bring, DO    Brief Narrative:   Todd Valdez is a 63 y.o. male with past medical history significant for HTN, rheumatoid arthritis on MTX/Orencia , bronchiectasis, possible underlying COPD, HLD, GERD, melanoma who presented to [facility] ED on 08/26/2024 with fever and cough, exertional dyspnea and generalized unwell feeling.  Recently hospitalized for pneumonia and discharged to complete 7-day course and recently followed up with PCP who prescribed an additional regimen of Levaquin.  In the ED, temperature 100.3 F, HR 103, BP 78/42, SpO2 95% on room air.  WBC 19.4, hemoglobin 11.8, platelet count 411.  Sodium 138, potassium 5.1, chloride 102, CO2 22, glucose 94, BUN 39, creat 1.88.  AST 43, ALT 12, total bilirubin 0.7.  BNP 155.0.  High sensitive troponin 25 followed by 21.  Lactic acid 1.0.  COVID/influenza/RSV PCR negative.  Urinalysis unrevealing.  Chest x-ray with mild/moderate severity bibasilar atelectasis and or infiltrate, right greater than left, small right pleural fusion.  Blood cultures x 2 obtained.  Patient was given IV fluids, started on IV ceftriaxone  and azithromycin .  TRH consulted for admission for further evaluation and management of pneumonia.  Assessment & Plan:   Severe sepsis, POA Community-acquired pneumonia Patient presenting with exertional dyspnea, cough, fever and generalized unwell feeling.  Recently hospitalized and treated for community acquired pneumonia with doxycycline  and ceftriaxone /cefadroxil and upon follow-up with PCP given additional course of antibiotics with Levaquin.  Patient reports recurrent fever, exertional dyspnea and generalized unwell feeling.  Patient with elevated WBC count of 19.4, tachycardic, hypotensive; and AKI.  Lactic acid 1.0.  Chest x-ray with mild/moderate severity bibasilar atelectasis and or infiltrate, right greater than left, small right pleural  fusion. -- WBC 19.4 -- Azithromycin  500 mg PO daily -- Ceftriaxone  2g IV q24h -- CBC in am  Acute renal failure -- Cr 1.88>1.45 (baseline 0.9 - 1.0) -- Holding home HCTZ/lisinopril  -- BMP in am  HTN Patient was notably hypotensive on admission -- Holding HCTZ/lisinopril   Rheumatoid arthritis On methotrexate  and Orencia  outpatient. -- Hold immunosuppressants in the setting of active infection as above  History of bronchiectasis Concern for underlying COPD -- Brovana neb twice daily -- Pulmicort neb twice daily -- Albuterol  neb every 4 hours.  Wheezing/shortness of breath -- Prednisone  50 mg p.o. daily  HLD -- Atorvastatin  40 mg p.o. daily -- Fenofibrate  160 mg p.o. daily -- Continue Repatha   GERD -- Protonix  80 mg p.o. daily (substituted for home Nexium )  DVT prophylaxis: enoxaparin  (LOVENOX ) injection 40 mg Start: 08/26/24 2200 SCDs Start: 08/26/24 2047    Code Status: Full Code Family Communication: No family present at bedside this morning  Disposition Plan:  Level of care: Telemetry Status is: Inpatient Remains inpatient appropriate because: IV antibiotics    Consultants:  None  Procedures:  None  Antimicrobials:  Azithromycin  Ceftriaxone    Subjective: Patient seen examined bedside, sitting in bedside chair.  Remains on 2 L nasal cannula with SpO2 93%.  Continues with cough, generalized unwell feeling.  Asking about his x-ray results.  No other specific questions or concerns at this time.  Denies headache, no vision changes, no chest pain, no palpitations, no current fever/chills, no nausea/vomitus diarrhea, no abdominal pain, no focal weakness, no fatigue, no paresthesias.  No acute events overnight per nursing staff.  Objective: Vitals:   08/27/24 0848 08/27/24 0851 08/27/24 1407 08/27/24 1453  BP:    (!) 93/57  Pulse:    ROLLEN)  105  Resp:    16  Temp:    98.4 F (36.9 C)  TempSrc:    Oral  SpO2: 98% 100% 97% 95%  Weight:      Height:         Intake/Output Summary (Last 24 hours) at 08/27/2024 1805 Last data filed at 08/27/2024 0900 Gross per 24 hour  Intake 1120 ml  Output --  Net 1120 ml   Filed Weights   08/26/24 2039  Weight: 93.9 kg    Examination:  Physical Exam: GEN: NAD, alert and oriented x 3, wd/wn HEENT: NCAT, PERRL, EOMI, sclera clear, MMM PULM: Breath sounds slight diminished bilateral bases with crackles, no wheezing, normal respiratory effort without accessory muscle use, on 2 L nasal cannula with SpO2 95% at rest CV: RRR w/o M/G/R GI: abd soft, NTND, + BS MSK: no peripheral edema, moves all extremities independently NEURO: CN II-XII intact, no focal deficits, sensation to light touch intact PSYCH: normal mood/affect Integumentary: dry/intact, no rashes or wounds    Data Reviewed: I have personally reviewed following labs and imaging studies  CBC: Recent Labs  Lab 08/26/24 1628  WBC 19.4*  NEUTROABS 15.6*  HGB 11.8*  HCT 37.6*  MCV 88.7  PLT 411*   Basic Metabolic Panel: Recent Labs  Lab 08/26/24 1628 08/27/24 0416  NA 138 138  K 5.1 4.2  CL 102 103  CO2 22 23  GLUCOSE 94 95  BUN 39* 29*  CREATININE 1.88* 1.45*  CALCIUM  10.6* 10.4*   GFR: Estimated Creatinine Clearance: 60.4 mL/min (A) (by C-G formula based on SCr of 1.45 mg/dL (H)). Liver Function Tests: Recent Labs  Lab 08/26/24 1628  AST 43*  ALT 12  ALKPHOS 109  BILITOT 0.7  PROT 7.6  ALBUMIN 3.6   No results for input(s): LIPASE, AMYLASE in the last 168 hours. No results for input(s): AMMONIA in the last 168 hours. Coagulation Profile: Recent Labs  Lab 08/26/24 1628  INR 1.2   Cardiac Enzymes: No results for input(s): CKTOTAL, CKMB, CKMBINDEX, TROPONINI in the last 168 hours. BNP (last 3 results) Recent Labs    08/26/24 1628  PROBNP 155.0   HbA1C: No results for input(s): HGBA1C in the last 72 hours. CBG: No results for input(s): GLUCAP in the last 168 hours. Lipid  Profile: No results for input(s): CHOL, HDL, LDLCALC, TRIG, CHOLHDL, LDLDIRECT in the last 72 hours. Thyroid  Function Tests: No results for input(s): TSH, T4TOTAL, FREET4, T3FREE, THYROIDAB in the last 72 hours. Anemia Panel: No results for input(s): VITAMINB12, FOLATE, FERRITIN, TIBC, IRON, RETICCTPCT in the last 72 hours. Sepsis Labs: Recent Labs  Lab 08/26/24 1658  LATICACIDVEN 1.0    Recent Results (from the past 240 hours)  Culture, blood (Routine x 2)     Status: None (Preliminary result)   Collection Time: 08/26/24  4:28 PM   Specimen: BLOOD LEFT FOREARM  Result Value Ref Range Status   Specimen Description   Final    BLOOD LEFT FOREARM Performed at [facility], [address], [place], [place] [address]    Special Requests   Final    BOTTLES DRAWN AEROBIC AND ANAEROBIC Blood Culture adequate volume Performed at [facility], [address], [place], [place] [address]    Culture   Final    NO GROWTH < 12 HOURS Performed at [facility], [address], [place], [place] [address]    Report Status PENDING  Incomplete  Culture, blood (Routine  x 2)     Status: None (Preliminary result)   Collection Time: 08/26/24  4:28 PM   Specimen: BLOOD RIGHT FOREARM  Result Value Ref Range Status   Specimen Description   Final    BLOOD RIGHT FOREARM Performed at [facility], [address] [place], [place] [address]    Special Requests   Final    BOTTLES DRAWN AEROBIC AND ANAEROBIC Blood Culture adequate volume Performed at [facility], [address], [place], [place] [address]    Culture   Final    NO GROWTH < 12 HOURS Performed at [facility], [address] [place], [place] [address]    Report Status PENDING  Incomplete  Resp panel by RT-PCR (RSV, Flu A&B, Covid) Anterior Nasal Swab     Status: None   Collection Time: 08/26/24  4:28 PM   Specimen: Anterior Nasal Swab   Result Value Ref Range Status   SARS Coronavirus 2 by RT PCR NEGATIVE NEGATIVE Final    Comment: (NOTE) SARS-CoV-2 target nucleic acids are NOT DETECTED.  The SARS-CoV-2 RNA is generally detectable in upper respiratory specimens during the acute phase of infection. The lowest concentration of SARS-CoV-2 viral copies this assay can detect is 138 copies/mL. A negative result does not preclude SARS-Cov-2 infection and should not be used as the sole basis for treatment or other patient management decisions. A negative result may occur with  improper specimen collection/handling, submission of specimen other than nasopharyngeal swab, presence of viral mutation(s) within the areas targeted by this assay, and inadequate number of viral copies(<138 copies/mL). A negative result must be combined with clinical observations, patient history, and epidemiological information. The expected result is Negative.  Fact Sheet for Patients:  bloggercourse.com  Fact Sheet for Healthcare Providers:  seriousbroker.it  This test is no t yet approved or cleared by the [place]  FDA and  has been authorized for detection and/or diagnosis of SARS-CoV-2 by FDA under an Emergency Use Authorization (EUA). This EUA will remain  in effect (meaning this test can be used) for the duration of the COVID-19 declaration under Section 564(b)(1) of the Act, 21 U.S.C.section 360bbb-3(b)(1), unless the authorization is terminated  or revoked sooner.       Influenza A by PCR NEGATIVE NEGATIVE Final   Influenza B by PCR NEGATIVE NEGATIVE Final    Comment: (NOTE) The Xpert Xpress SARS-CoV-2/FLU/RSV plus assay is intended as an aid in the diagnosis of influenza from Nasopharyngeal swab specimens and should not be used as a sole basis for treatment. Nasal washings and aspirates are unacceptable for Xpert Xpress SARS-CoV-2/FLU/RSV testing.  Fact Sheet for  Patients: bloggercourse.com  Fact Sheet for Healthcare Providers: seriousbroker.it  This test is not yet approved or cleared by the [place]  FDA and has been authorized for detection and/or diagnosis of SARS-CoV-2 by FDA under an Emergency Use Authorization (EUA). This EUA will remain in effect (meaning this test can be used) for the duration of the COVID-19 declaration under Section 564(b)(1) of the Act, 21 U.S.C. section 360bbb-3(b)(1), unless the authorization is terminated or revoked.     Resp Syncytial Virus by PCR NEGATIVE NEGATIVE Final    Comment: (NOTE) Fact Sheet for Patients: bloggercourse.com  Fact Sheet for Healthcare Providers: seriousbroker.it  This test is not yet approved or cleared by the [place]  FDA and has been authorized for detection and/or diagnosis of SARS-CoV-2 by FDA under an Emergency Use Authorization (EUA). This EUA will remain in  effect (meaning this test can be used) for the duration of the COVID-19 declaration under Section 564(b)(1) of the Act, 21 U.S.C. section 360bbb-3(b)(1), unless the authorization is terminated or revoked.  Performed at [facility], [address], [place], [place] [address]          Radiology Studies: DG Chest [facility] if patient is in a treatment room. Result Date: 08/26/2024 CLINICAL DATA:  Fever and generally unwell feeling with exertional dyspnea. EXAM: PORTABLE CHEST 1 VIEW COMPARISON:  July 23, 2024 FINDINGS: The heart size and mediastinal contours are within normal limits. Low lung volumes are noted. Mild to moderate severity areas of atelectasis and/or infiltrate are seen within the bilateral lung bases, right greater than left. This is mildly increased in severity when compared to the prior study. There is a small right pleural effusion. No pneumothorax is identified. The  visualized skeletal structures are unremarkable. IMPRESSION: 1. Mild to moderate severity bibasilar atelectasis and/or infiltrate, right greater than left. 2. Small right pleural effusion. Electronically Signed   By: Suzen Dials M.D.   On: 08/26/2024 17:10        Scheduled Meds:  arformoterol  15 mcg Nebulization BID   atorvastatin   40 mg Oral Daily   azithromycin   500 mg Oral Daily   budesonide (PULMICORT) nebulizer solution  0.25 mg Nebulization BID   enoxaparin  (LOVENOX ) injection  40 mg Subcutaneous Q24H   folic acid   1 mg Oral Daily   ipratropium-albuterol   3 mL Nebulization TID   predniSONE   50 mg Oral Q breakfast   Continuous Infusions:  cefTRIAXone  (ROCEPHIN )  IV       LOS: 1 day    Time spent: 50 minutes spent on 08/27/2024 caring for this patient face-to-face including chart review, ordering labs/tests, documenting, discussion with nursing staff, consultants, updating family and interview/physical exam    Camellia PARAS Mylinh Cragg, DO [facility] Available via Epic secure chat 7am-7pm After these hours, please refer to coverage provider listed on [email or web address] 08/27/2024, 6:05 PM

## 2024-08-27 NOTE — Telephone Encounter (Signed)
 Patient Product/process Development Scientist completed.    The patient is insured through [facility]. Patient has Medicare and is not eligible for a copay card, but may be able to apply for patient assistance or Medicare RX Payment Plan (Patient Must reach out to their plan, if eligible for payment plan), if available.    Ran test claim for arformoterol (Brovana) 15 mcg/2 ml and the current 30 day co-pay is $36.93.  Ran test claim for budesonide 0.25 mg/2 ml and the current 30 day co-pay is $13.34.  This test claim was processed through [facility]- copay amounts may vary at other pharmacies due to pharmacy/plan contracts, or as the patient moves through the different stages of their insurance plan.     Reyes Sharps, CPHT Pharmacy Technician Patient Advocate Specialist [facility] Patient Advocate Team Direct Number: [phone]  Fax: [phone]

## 2024-08-27 NOTE — Progress Notes (Signed)
 Mobility Specialist - Progress Note  (RA) During mobility: 117 bpm HR, 90% SpO2 Post-mobility: 105 bpm HR, 92% SPO2   08/27/24 0920  Mobility  Activity Ambulated independently  Level of Assistance Independent  Assistive Device None  Distance Ambulated (ft) 350 ft  Range of Motion/Exercises Active  Activity Response Tolerated well  Mobility visit 1 Mobility  Mobility Specialist Start Time (ACUTE ONLY) 0910  Mobility Specialist Stop Time (ACUTE ONLY) 0920  Mobility Specialist Time Calculation (min) (ACUTE ONLY) 10 min   Pt was found in bed and agreeable to mobilize. No complaints. At EOS returned to bed with all needs met. Call bell in reach.   Erminio Leos,  Mobility Specialist Can be reached via Secure Chat

## 2024-08-28 ENCOUNTER — Ambulatory Visit (HOSPITAL_BASED_OUTPATIENT_CLINIC_OR_DEPARTMENT_OTHER)

## 2024-08-28 ENCOUNTER — Other Ambulatory Visit: Payer: Self-pay

## 2024-08-28 DIAGNOSIS — J189 Pneumonia, unspecified organism: Secondary | ICD-10-CM | POA: Diagnosis not present

## 2024-08-28 LAB — BASIC METABOLIC PANEL WITH GFR
Anion gap: 10 (ref 5–15)
BUN: 27 mg/dL — ABNORMAL HIGH (ref 8–23)
CO2: 26 mmol/L (ref 22–32)
Calcium: 11.1 mg/dL — ABNORMAL HIGH (ref 8.9–10.3)
Chloride: 104 mmol/L (ref 98–111)
Creatinine, Ser: 1.22 mg/dL (ref 0.61–1.24)
GFR, Estimated: >60 mL/min (ref >60)
Glucose, Bld: 114 mg/dL — ABNORMAL HIGH (ref 70–99)
Potassium: 4.4 mmol/L (ref 3.5–5.1)
Sodium: 140 mmol/L (ref 135–145)

## 2024-08-28 LAB — CBC
HCT: 29.3 % — ABNORMAL LOW (ref 39.0–52.0)
Hemoglobin: 9.2 g/dL — ABNORMAL LOW (ref 13.0–17.0)
MCH: 27.9 pg (ref 26.0–34.0)
MCHC: 31.4 g/dL (ref 30.0–36.0)
MCV: 88.8 fL (ref 80.0–100.0)
Platelets: 351 K/uL (ref 150–400)
RBC: 3.3 MIL/uL — ABNORMAL LOW (ref 4.22–5.81)
RDW: 14.6 % (ref 11.5–15.5)
WBC: 12.3 K/uL — ABNORMAL HIGH (ref 4.0–10.5)
nRBC: 0 % (ref 0.0–0.2)

## 2024-08-28 NOTE — Plan of Care (Signed)

## 2024-08-28 NOTE — Assessment & Plan Note (Signed)
 Concern for recurrent pneumonia.  Meets sepsis criteria with hypotension, tachycardia and fever.  Recommend ED evaluation for possible admission.  They prefer to travel via private vehicle.

## 2024-08-28 NOTE — Progress Notes (Signed)
 PROGRESS NOTE    Todd Valdez  FMW:[id number] DOB: 1960-12-27 DOA: 08/26/2024 PCP: Alvia Bring, DO    Brief Narrative:   Todd Valdez is a 63 y.o. male with past medical history significant for HTN, rheumatoid arthritis on MTX/Orencia , bronchiectasis, possible underlying COPD, HLD, GERD, melanoma who presented to [facility] ED on 08/26/2024 with fever and cough, exertional dyspnea and generalized unwell feeling.  Recently hospitalized for pneumonia and discharged to complete 7-day course and recently followed up with PCP who prescribed an additional regimen of Levaquin .  In the ED, temperature 100.3 F, HR 103, BP 78/42, SpO2 95% on room air.  WBC 19.4, hemoglobin 11.8, platelet count 411.  Sodium 138, potassium 5.1, chloride 102, CO2 22, glucose 94, BUN 39, creat 1.88.  AST 43, ALT 12, total bilirubin 0.7.  BNP 155.0.  High sensitive troponin 25 followed by 21.  Lactic acid 1.0.  COVID/influenza/RSV PCR negative.  Urinalysis unrevealing.  Chest x-ray with mild/moderate severity bibasilar atelectasis and or infiltrate, right greater than left, small right pleural fusion.  Blood cultures x 2 obtained.  Patient was given IV fluids, started on IV ceftriaxone  and azithromycin .  TRH consulted for admission for further evaluation and management of pneumonia.  Assessment & Plan:   Severe sepsis, POA Community-acquired pneumonia Patient presenting with exertional dyspnea, cough, fever and generalized unwell feeling.  Recently hospitalized and treated for community acquired pneumonia with doxycycline  and ceftriaxone /cefadroxil  and upon follow-up with PCP given additional course of antibiotics with Levaquin .  Patient reports recurrent fever, exertional dyspnea and generalized unwell feeling.  Patient with elevated WBC count of 19.4, tachycardic, hypotensive; and AKI.  Lactic acid 1.0.  Chest x-ray with mild/moderate severity bibasilar atelectasis and or infiltrate, right greater than left, small right pleural  fusion. -- WBC 19.4>12.3 -- Azithromycin  500 mg PO daily -- Ceftriaxone  2g IV q24h -- CBC in am  Acute renal failure -- Cr 1.88>1.45>1.22 (baseline 0.9 - 1.0) -- Holding home HCTZ/lisinopril  -- BMP in am  HTN Patient was notably hypotensive on admission -- Holding HCTZ/lisinopril   Rheumatoid arthritis On methotrexate  and Orencia  outpatient. -- Hold immunosuppressants in the setting of active infection as above  History of bronchiectasis Concern for underlying COPD -- Brovana  neb twice daily -- Pulmicort  neb twice daily -- Albuterol  neb every 4 hours.  Wheezing/shortness of breath -- Prednisone  50 mg p.o. daily  HLD -- Atorvastatin  40 mg p.o. daily -- Fenofibrate  160 mg p.o. daily -- Continue Repatha  outpatient  GERD -- Protonix  80 mg p.o. daily (substituted for home Nexium )  DVT prophylaxis: enoxaparin  (LOVENOX ) injection 40 mg Start: 08/26/24 2200 SCDs Start: 08/26/24 2047    Code Status: Full Code Family Communication: Updated spouse present at bedside this morning  Disposition Plan:  Level of care: Telemetry Status is: Inpatient Remains inpatient appropriate because: IV antibiotics    Consultants:  None  Procedures:  None  Antimicrobials:  Azithromycin  Ceftriaxone    Subjective: Patient seen examined bedside, walking around the room, has oxygen currently off.  Spouse present at bedside and updated.  Overall feels improved.  No other specific questions or concerns at this time.  Denies headache, no vision changes, no chest pain, no palpitations, no current fever/chills, no nausea/vomitus diarrhea, no abdominal pain, no focal weakness, no fatigue, no paresthesias.  No acute events overnight per nursing staff.  Objective: Vitals:   08/28/24 0411 08/28/24 0728 08/28/24 1311 08/28/24 1340  BP: 100/73  111/71   Pulse: 74  (!) 102   Resp: 20  18  Temp: 97.7 F (36.5 C)  97.6 F (36.4 C)   TempSrc: Oral  Oral   SpO2: 97% 92% 94% 95%  Weight:       Height:        Intake/Output Summary (Last 24 hours) at 08/28/2024 1448 Last data filed at 08/28/2024 0900 Gross per 24 hour  Intake 700.06 ml  Output --  Net 700.06 ml   Filed Weights   08/26/24 2039  Weight: 93.9 kg    Examination:  Physical Exam: GEN: NAD, alert and oriented x 3, wd/wn HEENT: NCAT, PERRL, EOMI, sclera clear, MMM PULM: Breath sounds slight diminished bilateral bases with crackles, no wheezing, normal respiratory effort without accessory muscle use, on 2 L nasal cannula with SpO2 95% at rest CV: RRR w/o M/G/R GI: abd soft, NTND, + BS MSK: no peripheral edema, moves all extremities independently NEURO: CN II-XII intact, no focal deficits, sensation to light touch intact PSYCH: normal mood/affect Integumentary: dry/intact, no rashes or wounds    Data Reviewed: I have personally reviewed following labs and imaging studies  CBC: Recent Labs  Lab 08/26/24 1628 08/28/24 0446  WBC 19.4* 12.3*  NEUTROABS 15.6*  --   HGB 11.8* 9.2*  HCT 37.6* 29.3*  MCV 88.7 88.8  PLT 411* 351   Basic Metabolic Panel: Recent Labs  Lab 08/26/24 1628 08/27/24 0416 08/28/24 0446  NA 138 138 140  K 5.1 4.2 4.4  CL 102 103 104  CO2 22 23 26   GLUCOSE 94 95 114*  BUN 39* 29* 27*  CREATININE 1.88* 1.45* 1.22  CALCIUM  10.6* 10.4* 11.1*   GFR: Estimated Creatinine Clearance: 71.8 mL/min (by C-G formula based on SCr of 1.22 mg/dL). Liver Function Tests: Recent Labs  Lab 08/26/24 1628  AST 43*  ALT 12  ALKPHOS 109  BILITOT 0.7  PROT 7.6  ALBUMIN 3.6   No results for input(s): LIPASE, AMYLASE in the last 168 hours. No results for input(s): AMMONIA in the last 168 hours. Coagulation Profile: Recent Labs  Lab 08/26/24 1628  INR 1.2   Cardiac Enzymes: No results for input(s): CKTOTAL, CKMB, CKMBINDEX, TROPONINI in the last 168 hours. BNP (last 3 results) Recent Labs    08/26/24 1628  PROBNP 155.0   HbA1C: No results for input(s):  HGBA1C in the last 72 hours. CBG: No results for input(s): GLUCAP in the last 168 hours. Lipid Profile: No results for input(s): CHOL, HDL, LDLCALC, TRIG, CHOLHDL, LDLDIRECT in the last 72 hours. Thyroid  Function Tests: No results for input(s): TSH, T4TOTAL, FREET4, T3FREE, THYROIDAB in the last 72 hours. Anemia Panel: No results for input(s): VITAMINB12, FOLATE, FERRITIN, TIBC, IRON, RETICCTPCT in the last 72 hours. Sepsis Labs: Recent Labs  Lab 08/26/24 1658  LATICACIDVEN 1.0    Recent Results (from the past 240 hours)  Culture, blood (Routine x 2)     Status: None (Preliminary result)   Collection Time: 08/26/24  4:28 PM   Specimen: BLOOD LEFT FOREARM  Result Value Ref Range Status   Specimen Description   Final    BLOOD LEFT FOREARM Performed at [facility], [address] [place], [place] [address]    Special Requests   Final    BOTTLES DRAWN AEROBIC AND ANAEROBIC Blood Culture adequate volume Performed at [facility], [address], [place], [place] [address]    Culture   Final    NO GROWTH 2 DAYS Performed at [facility], [address], [place], [place] [address]  Report Status PENDING  Incomplete  Culture, blood (Routine x 2)     Status: None (Preliminary result)   Collection Time: 08/26/24  4:28 PM   Specimen: BLOOD RIGHT FOREARM  Result Value Ref Range Status   Specimen Description   Final    BLOOD RIGHT FOREARM Performed at [facility], [address] [place], [place] [address]    Special Requests   Final    BOTTLES DRAWN AEROBIC AND ANAEROBIC Blood Culture adequate volume Performed at [facility], [address] [place], [place] [address]    Culture   Final    NO GROWTH 2 DAYS Performed at [facility], [address], [place], [place] [address]    Report Status PENDING  Incomplete  Resp panel by RT-PCR (RSV, Flu A&B, Covid) Anterior Nasal Swab      Status: None   Collection Time: 08/26/24  4:28 PM   Specimen: Anterior Nasal Swab  Result Value Ref Range Status   SARS Coronavirus 2 by RT PCR NEGATIVE NEGATIVE Final    Comment: (NOTE) SARS-CoV-2 target nucleic acids are NOT DETECTED.  The SARS-CoV-2 RNA is generally detectable in upper respiratory specimens during the acute phase of infection. The lowest concentration of SARS-CoV-2 viral copies this assay can detect is 138 copies/mL. A negative result does not preclude SARS-Cov-2 infection and should not be used as the sole basis for treatment or other patient management decisions. A negative result may occur with  improper specimen collection/handling, submission of specimen other than nasopharyngeal swab, presence of viral mutation(s) within the areas targeted by this assay, and inadequate number of viral copies(<138 copies/mL). A negative result must be combined with clinical observations, patient history, and epidemiological information. The expected result is Negative.  Fact Sheet for Patients:  bloggercourse.com  Fact Sheet for Healthcare Providers:  seriousbroker.it  This test is no t yet approved or cleared by the [place]  FDA and  has been authorized for detection and/or diagnosis of SARS-CoV-2 by FDA under an Emergency Use Authorization (EUA). This EUA will remain  in effect (meaning this test can be used) for the duration of the COVID-19 declaration under Section 564(b)(1) of the Act, 21 U.S.C.section 360bbb-3(b)(1), unless the authorization is terminated  or revoked sooner.       Influenza A by PCR NEGATIVE NEGATIVE Final   Influenza B by PCR NEGATIVE NEGATIVE Final    Comment: (NOTE) The Xpert Xpress SARS-CoV-2/FLU/RSV plus assay is intended as an aid in the diagnosis of influenza from Nasopharyngeal swab specimens and should not be used as a sole basis for treatment. Nasal washings and aspirates are  unacceptable for Xpert Xpress SARS-CoV-2/FLU/RSV testing.  Fact Sheet for Patients: bloggercourse.com  Fact Sheet for Healthcare Providers: seriousbroker.it  This test is not yet approved or cleared by the [place]  FDA and has been authorized for detection and/or diagnosis of SARS-CoV-2 by FDA under an Emergency Use Authorization (EUA). This EUA will remain in effect (meaning this test can be used) for the duration of the COVID-19 declaration under Section 564(b)(1) of the Act, 21 U.S.C. section 360bbb-3(b)(1), unless the authorization is terminated or revoked.     Resp Syncytial Virus by PCR NEGATIVE NEGATIVE Final    Comment: (NOTE) Fact Sheet for Patients: bloggercourse.com  Fact Sheet for Healthcare Providers: seriousbroker.it  This test is not yet approved or cleared by the [place]  FDA and has been authorized for detection and/or diagnosis of SARS-CoV-2 by FDA under an Emergency  Use Authorization (EUA). This EUA will remain in effect (meaning this test can be used) for the duration of the COVID-19 declaration under Section 564(b)(1) of the Act, 21 U.S.C. section 360bbb-3(b)(1), unless the authorization is terminated or revoked.  Performed at [facility], [address] [place], [place] [address]          Radiology Studies: DG Chest [facility] if patient is in a treatment room. Result Date: 08/26/2024 CLINICAL DATA:  Fever and generally unwell feeling with exertional dyspnea. EXAM: PORTABLE CHEST 1 VIEW COMPARISON:  July 23, 2024 FINDINGS: The heart size and mediastinal contours are within normal limits. Low lung volumes are noted. Mild to moderate severity areas of atelectasis and/or infiltrate are seen within the bilateral lung bases, right greater than left. This is mildly increased in severity when compared to the prior study.  There is a small right pleural effusion. No pneumothorax is identified. The visualized skeletal structures are unremarkable. IMPRESSION: 1. Mild to moderate severity bibasilar atelectasis and/or infiltrate, right greater than left. 2. Small right pleural effusion. Electronically Signed   By: Suzen Dials M.D.   On: 08/26/2024 17:10        Scheduled Meds:  arformoterol   15 mcg Nebulization BID   atorvastatin   40 mg Oral Daily   azithromycin   500 mg Oral Daily   budesonide  (PULMICORT ) nebulizer solution  0.25 mg Nebulization BID   enoxaparin  (LOVENOX ) injection  40 mg Subcutaneous Q24H   fenofibrate   160 mg Oral Daily   folic acid   1 mg Oral Daily   ipratropium-albuterol   3 mL Nebulization TID   pantoprazole   80 mg Oral Q1200   predniSONE   50 mg Oral Q breakfast   Continuous Infusions:  cefTRIAXone  (ROCEPHIN )  IV Stopped (08/27/24 1918)     LOS: 2 days    Time spent: 48 minutes spent on 08/28/2024 caring for this patient face-to-face including chart review, ordering labs/tests, documenting, discussion with nursing staff, consultants, updating family and interview/physical exam    Camellia PARAS Suttyn Cryder, DO [facility] Available via Epic secure chat 7am-7pm After these hours, please refer to coverage provider listed on [email or web address] 08/28/2024, 2:48 PM

## 2024-08-29 DIAGNOSIS — J189 Pneumonia, unspecified organism: Secondary | ICD-10-CM | POA: Diagnosis not present

## 2024-08-29 LAB — BASIC METABOLIC PANEL WITH GFR
Anion gap: 10 (ref 5–15)
BUN: 26 mg/dL — ABNORMAL HIGH (ref 8–23)
CO2: 24 mmol/L (ref 22–32)
Calcium: 10.5 mg/dL — ABNORMAL HIGH (ref 8.9–10.3)
Chloride: 106 mmol/L (ref 98–111)
Creatinine, Ser: 1.08 mg/dL (ref 0.61–1.24)
GFR, Estimated: >60 mL/min (ref >60)
Glucose, Bld: 112 mg/dL — ABNORMAL HIGH (ref 70–99)
Potassium: 4.3 mmol/L (ref 3.5–5.1)
Sodium: 140 mmol/L (ref 135–145)

## 2024-08-29 LAB — CBC
HCT: 30.1 % — ABNORMAL LOW (ref 39.0–52.0)
Hemoglobin: 9.4 g/dL — ABNORMAL LOW (ref 13.0–17.0)
MCH: 27.6 pg (ref 26.0–34.0)
MCHC: 31.2 g/dL (ref 30.0–36.0)
MCV: 88.5 fL (ref 80.0–100.0)
Platelets: 380 K/uL (ref 150–400)
RBC: 3.4 MIL/uL — ABNORMAL LOW (ref 4.22–5.81)
RDW: 14.6 % (ref 11.5–15.5)
WBC: 12.8 K/uL — ABNORMAL HIGH (ref 4.0–10.5)
nRBC: 0 % (ref 0.0–0.2)

## 2024-08-29 MED ORDER — PREDNISONE 20 MG PO TABS
20.0000 mg | ORAL_TABLET | Freq: Every day | ORAL | Status: DC
  Administered 2024-08-30: 20 mg via ORAL
  Filled 2024-08-29: qty 1

## 2024-08-29 NOTE — Plan of Care (Signed)

## 2024-08-29 NOTE — Progress Notes (Signed)
 PROGRESS NOTE    Todd Valdez  FMW:[id number] DOB: [birth year] DOA: 08/26/2024 PCP: Alvia Bring, DO    Brief Narrative:   Todd Valdez is a 63 y.o. male with past medical history significant for HTN, rheumatoid arthritis on MTX/Orencia , bronchiectasis, possible underlying COPD, HLD, GERD, melanoma who presented to [facility] ED on 08/26/2024 with fever and cough, exertional dyspnea and generalized unwell feeling.  Recently hospitalized for pneumonia and discharged to complete 7-day course and recently followed up with PCP who prescribed an additional regimen of Levaquin .  In the ED, temperature 100.3 F, HR 103, BP 78/42, SpO2 95% on room air.  WBC 19.4, hemoglobin 11.8, platelet count 411.  Sodium 138, potassium 5.1, chloride 102, CO2 22, glucose 94, BUN 39, creat 1.88.  AST 43, ALT 12, total bilirubin 0.7.  BNP 155.0.  High sensitive troponin 25 followed by 21.  Lactic acid 1.0.  COVID/influenza/RSV PCR negative.  Urinalysis unrevealing.  Chest x-ray with mild/moderate severity bibasilar atelectasis and or infiltrate, right greater than left, small right pleural fusion.  Blood cultures x 2 obtained.  Patient was given IV fluids, started on IV ceftriaxone  and azithromycin .  TRH consulted for admission for further evaluation and management of pneumonia.  Assessment & Plan:   Severe sepsis, POA Community-acquired pneumonia Patient presenting with exertional dyspnea, cough, fever and generalized unwell feeling.  Recently hospitalized and treated for community acquired pneumonia with doxycycline  and ceftriaxone /cefadroxil  and upon follow-up with PCP given additional course of antibiotics with Levaquin .  Patient reports recurrent fever, exertional dyspnea and generalized unwell feeling.  Patient with elevated WBC count of 19.4, tachycardic, hypotensive; and AKI.  Lactic acid 1.0.  Chest x-ray with mild/moderate severity bibasilar atelectasis and or infiltrate, right greater than left, small right pleural  fusion. -- WBC 19.4>12.3>12.8 -- Azithromycin  500 mg PO daily -- Ceftriaxone  2g IV q24h -- CBC in am  Acute renal failure -- Cr 1.88>1.45>1.22>1.08 (baseline 0.9 - 1.0) -- Holding home HCTZ/lisinopril  -- BMP in am  HTN Patient was notably hypotensive on admission -- Holding HCTZ/lisinopril   Rheumatoid arthritis On methotrexate  and Orencia  outpatient. -- Hold immunosuppressants in the setting of active infection as above  History of bronchiectasis Concern for underlying COPD -- Brovana  neb twice daily -- Pulmicort  neb twice daily -- Albuterol  neb every 4 hours.  Wheezing/shortness of breath -- Decrease prednisone  to 20 mg p.o. daily starting tomorrow  HLD -- Atorvastatin  40 mg p.o. daily -- Fenofibrate  160 mg p.o. daily -- Continue Repatha  outpatient  GERD -- Protonix  80 mg p.o. daily (substituted for home Nexium )  DVT prophylaxis: enoxaparin  (LOVENOX ) injection 40 mg Start: 08/26/24 2200 SCDs Start: 08/26/24 2047    Code Status: Full Code Family Communication: Updated spouse present at bedside this morning  Disposition Plan:  Level of care: Telemetry Status is: Inpatient Remains inpatient appropriate because: IV antibiotics    Consultants:  None  Procedures:  None  Antimicrobials:  Azithromycin  11/18>> Ceftriaxone  11/18>>   Subjective: Patient seen examined bedside, sitting in bedside chair.Or complaints this morning.  Breathing continues to improve daily.  Remains on 2 L nasal cannula.  Will attempt repeat ambulatory O2 screen today.  Reports has been walking around the hallway without oxygen yesterday; denied any issues.  No other specific questions or concerns at this time.  Denies headache, no vision changes, no chest pain, no palpitations, no current fever/chills, no nausea/vomitus diarrhea, no abdominal pain, no focal weakness, no fatigue, no paresthesias.  No acute events overnight per nursing staff.  Objective: Vitals:  08/28/24 2118 08/28/24  2120 08/29/24 0453 08/29/24 0755  BP:   108/68   Pulse:   69   Resp:      Temp:   98 F (36.7 C)   TempSrc:   Oral   SpO2: 96% 96% 97% 96%  Weight:      Height:        Intake/Output Summary (Last 24 hours) at 08/29/2024 1222 Last data filed at 08/29/2024 0919 Gross per 24 hour  Intake 240 ml  Output --  Net 240 ml   Filed Weights   08/26/24 2039  Weight: 93.9 kg    Examination:  Physical Exam: GEN: NAD, alert and oriented x 3, wd/wn HEENT: NCAT, PERRL, EOMI, sclera clear, MMM PULM: Breath sounds slight diminished bilateral bases with crackles, no wheezing, normal respiratory effort without accessory muscle use, on 2 L nasal cannula with SpO2 96% at rest CV: RRR w/o M/G/R GI: abd soft, NTND, + BS MSK: no peripheral edema, moves all extremities independently NEURO: CN II-XII intact, no focal deficits, sensation to light touch intact PSYCH: normal mood/affect Integumentary: dry/intact, no rashes or wounds    Data Reviewed: I have personally reviewed following labs and imaging studies  CBC: Recent Labs  Lab 08/26/24 1628 08/28/24 0446 08/29/24 0410  WBC 19.4* 12.3* 12.8*  NEUTROABS 15.6*  --   --   HGB 11.8* 9.2* 9.4*  HCT 37.6* 29.3* 30.1*  MCV 88.7 88.8 88.5  PLT 411* 351 380   Basic Metabolic Panel: Recent Labs  Lab 08/26/24 1628 08/27/24 0416 08/28/24 0446 08/29/24 0410  NA 138 138 140 140  K 5.1 4.2 4.4 4.3  CL 102 103 104 106  CO2 22 23 26 24   GLUCOSE 94 95 114* 112*  BUN 39* 29* 27* 26*  CREATININE 1.88* 1.45* 1.22 1.08  CALCIUM  10.6* 10.4* 11.1* 10.5*   GFR: Estimated Creatinine Clearance: 81.1 mL/min (by C-G formula based on SCr of 1.08 mg/dL). Liver Function Tests: Recent Labs  Lab 08/26/24 1628  AST 43*  ALT 12  ALKPHOS 109  BILITOT 0.7  PROT 7.6  ALBUMIN 3.6   No results for input(s): LIPASE, AMYLASE in the last 168 hours. No results for input(s): AMMONIA in the last 168 hours. Coagulation Profile: Recent Labs  Lab  08/26/24 1628  INR 1.2   Cardiac Enzymes: No results for input(s): CKTOTAL, CKMB, CKMBINDEX, TROPONINI in the last 168 hours. BNP (last 3 results) Recent Labs    08/26/24 1628  PROBNP 155.0   HbA1C: No results for input(s): HGBA1C in the last 72 hours. CBG: No results for input(s): GLUCAP in the last 168 hours. Lipid Profile: No results for input(s): CHOL, HDL, LDLCALC, TRIG, CHOLHDL, LDLDIRECT in the last 72 hours. Thyroid  Function Tests: No results for input(s): TSH, T4TOTAL, FREET4, T3FREE, THYROIDAB in the last 72 hours. Anemia Panel: No results for input(s): VITAMINB12, FOLATE, FERRITIN, TIBC, IRON, RETICCTPCT in the last 72 hours. Sepsis Labs: Recent Labs  Lab 08/26/24 1658  LATICACIDVEN 1.0    Recent Results (from the past 240 hours)  Culture, blood (Routine x 2)     Status: None (Preliminary result)   Collection Time: 08/26/24  4:28 PM   Specimen: BLOOD LEFT FOREARM  Result Value Ref Range Status   Specimen Description   Final    BLOOD LEFT FOREARM Performed at [facility], [address] [place], [place] [address]    Special Requests   Final    BOTTLES DRAWN AEROBIC AND ANAEROBIC  Blood Culture adequate volume Performed at [facility], [address], [place], [place] [address]    Culture   Final    NO GROWTH 3 DAYS Performed at [facility], [address] [place], [place] [address]    Report Status PENDING  Incomplete  Culture, blood (Routine x 2)     Status: None (Preliminary result)   Collection Time: 08/26/24  4:28 PM   Specimen: BLOOD RIGHT FOREARM  Result Value Ref Range Status   Specimen Description   Final    BLOOD RIGHT FOREARM Performed at [facility], [address] [place], [place] [address]    Special Requests   Final    BOTTLES DRAWN AEROBIC AND ANAEROBIC Blood Culture adequate volume Performed at [facility], [address], [place], [place] [address]    Culture   Final    NO GROWTH 3 DAYS Performed at [facility], [address], [place], [place] [address]    Report Status PENDING  Incomplete  Resp panel by RT-PCR (RSV, Flu A&B, Covid) Anterior Nasal Swab     Status: None   Collection Time: 08/26/24  4:28 PM   Specimen: Anterior Nasal Swab  Result Value Ref Range Status   SARS Coronavirus 2 by RT PCR NEGATIVE NEGATIVE Final    Comment: (NOTE) SARS-CoV-2 target nucleic acids are NOT DETECTED.  The SARS-CoV-2 RNA is generally detectable in upper respiratory specimens during the acute phase of infection. The lowest concentration of SARS-CoV-2 viral copies this assay can detect is 138 copies/mL. A negative result does not preclude SARS-Cov-2 infection and should not be used as the sole basis for treatment or other patient management decisions. A negative result may occur with  improper specimen collection/handling, submission of specimen other than nasopharyngeal swab, presence of viral mutation(s) within the areas targeted by this assay, and inadequate number of viral copies(<138 copies/mL). A negative result must be combined with clinical observations, patient history, and epidemiological information. The expected result is Negative.  Fact Sheet for Patients:  bloggercourse.com  Fact Sheet for Healthcare Providers:  seriousbroker.it  This test is no t yet approved or cleared by the [place]  FDA and  has been authorized for detection and/or diagnosis of SARS-CoV-2 by FDA under an Emergency Use Authorization (EUA). This EUA will remain  in effect (meaning this test can be used) for the duration of the COVID-19 declaration under Section 564(b)(1) of the Act, 21 U.S.C.section 360bbb-3(b)(1), unless the authorization is terminated  or revoked sooner.       Influenza A by PCR NEGATIVE NEGATIVE Final   Influenza B by PCR NEGATIVE NEGATIVE  Final    Comment: (NOTE) The Xpert Xpress SARS-CoV-2/FLU/RSV plus assay is intended as an aid in the diagnosis of influenza from Nasopharyngeal swab specimens and should not be used as a sole basis for treatment. Nasal washings and aspirates are unacceptable for Xpert Xpress SARS-CoV-2/FLU/RSV testing.  Fact Sheet for Patients: bloggercourse.com  Fact Sheet for Healthcare Providers: seriousbroker.it  This test is not yet approved or cleared by the [place]  FDA and has been authorized for detection and/or diagnosis of SARS-CoV-2 by FDA under an Emergency Use Authorization (EUA). This EUA will remain in effect (meaning this test can be used) for the duration of the COVID-19 declaration under Section 564(b)(1) of the Act, 21 U.S.C. section 360bbb-3(b)(1), unless the authorization is terminated or revoked.     Resp Syncytial Virus by PCR NEGATIVE NEGATIVE  Final    Comment: (NOTE) Fact Sheet for Patients: bloggercourse.com  Fact Sheet for Healthcare Providers: seriousbroker.it  This test is not yet approved or cleared by the [place]  FDA and has been authorized for detection and/or diagnosis of SARS-CoV-2 by FDA under an Emergency Use Authorization (EUA). This EUA will remain in effect (meaning this test can be used) for the duration of the COVID-19 declaration under Section 564(b)(1) of the Act, 21 U.S.C. section 360bbb-3(b)(1), unless the authorization is terminated or revoked.  Performed at [facility], [address], [place], [place] [address]          Radiology Studies: No results found.       Scheduled Meds:  arformoterol   15 mcg Nebulization BID   atorvastatin   40 mg Oral Daily   azithromycin   500 mg Oral Daily   budesonide  (PULMICORT ) nebulizer solution  0.25 mg Nebulization BID   enoxaparin  (LOVENOX ) injection  40 mg  Subcutaneous Q24H   fenofibrate   160 mg Oral Daily   folic acid   1 mg Oral Daily   ipratropium-albuterol   3 mL Nebulization TID   pantoprazole   80 mg Oral Q1200   predniSONE   50 mg Oral Q breakfast   Continuous Infusions:  cefTRIAXone  (ROCEPHIN )  IV Stopped (08/28/24 1854)     LOS: 3 days    Time spent: 48 minutes spent on 08/29/2024 caring for this patient face-to-face including chart review, ordering labs/tests, documenting, discussion with nursing staff, consultants, updating family and interview/physical exam    Camellia PARAS Graison Leinberger, DO [facility] Available via Epic secure chat 7am-7pm After these hours, please refer to coverage provider listed on [email or web address] 08/29/2024, 12:22 PM

## 2024-08-29 NOTE — Progress Notes (Signed)
 SATURATION QUALIFICATIONS: (This note is used to comply with regulatory documentation for home oxygen)  Patient Saturations on Room Air at Rest = 92%  Patient Saturations on Room Air while Ambulating = 92-93%  Patient Saturations on  Liters of oxygen while Ambulating = 92-93%  Patient has been on 3 liters of oxygen while in the hospital. Did not wear oxygen while ambulating.

## 2024-08-29 NOTE — Progress Notes (Signed)
   08/29/24 1122  TOC Brief Assessment  Insurance and Status Reviewed  Patient has primary care physician Yes  Home environment has been reviewed home with spouse  Prior level of function: independent  Prior/Current Home Services No current home services  Social Drivers of Health Review SDOH reviewed no interventions necessary  Readmission risk has been reviewed Yes  Transition of care needs no transition of care needs at this time

## 2024-08-30 ENCOUNTER — Other Ambulatory Visit (HOSPITAL_COMMUNITY): Payer: Self-pay

## 2024-08-30 DIAGNOSIS — J189 Pneumonia, unspecified organism: Secondary | ICD-10-CM | POA: Diagnosis not present

## 2024-08-30 LAB — BASIC METABOLIC PANEL WITH GFR
Anion gap: 12 (ref 5–15)
BUN: 25 mg/dL — ABNORMAL HIGH (ref 8–23)
CO2: 23 mmol/L (ref 22–32)
Calcium: 11.1 mg/dL — ABNORMAL HIGH (ref 8.9–10.3)
Chloride: 104 mmol/L (ref 98–111)
Creatinine, Ser: 1.08 mg/dL (ref 0.61–1.24)
GFR, Estimated: >60 mL/min (ref >60)
Glucose, Bld: 92 mg/dL (ref 70–99)
Potassium: 4.2 mmol/L (ref 3.5–5.1)
Sodium: 139 mmol/L (ref 135–145)

## 2024-08-30 LAB — CBC
HCT: 31.9 % — ABNORMAL LOW (ref 39.0–52.0)
Hemoglobin: 10 g/dL — ABNORMAL LOW (ref 13.0–17.0)
MCH: 27.8 pg (ref 26.0–34.0)
MCHC: 31.3 g/dL (ref 30.0–36.0)
MCV: 88.6 fL (ref 80.0–100.0)
Platelets: 422 K/uL — ABNORMAL HIGH (ref 150–400)
RBC: 3.6 MIL/uL — ABNORMAL LOW (ref 4.22–5.81)
RDW: 14.6 % (ref 11.5–15.5)
WBC: 14.3 K/uL — ABNORMAL HIGH (ref 4.0–10.5)
nRBC: 0 % (ref 0.0–0.2)

## 2024-08-30 MED ORDER — LEVOFLOXACIN 750 MG PO TABS
750.0000 mg | ORAL_TABLET | Freq: Every day | ORAL | 0 refills | Status: AC
  Filled 2024-08-30: qty 10, 10d supply, fill #0

## 2024-08-30 NOTE — Discharge Summary (Signed)
 Physician Discharge Summary  Todd Valdez FMW:[id number] DOB: 1961/03/24 DOA: 08/26/2024  PCP: Alvia Bring, DO  Admit date: 08/26/2024 Discharge date: 08/30/2024  Admitted From: Home Disposition: Home  Recommendations for Outpatient Follow-up:  Follow up with PCP in 1-2 weeks Follow-up with pulmonology as scheduled on 09/02/2024 Continue levothyroxine 750 mg p.o. daily to complete 14-day course for recurrent pneumonia Continue to hold methotrexate /Orencia  until follow-up with PCP/pulmonology/rheumatology given recurrent pneumonia likely complicated by immunosuppression Continue to hold home lisinopril /HCTZ given hypotension on admission, and well-controlled blood pressures during the remainder of hospitalization; follow-up with PCP for further guidance Please obtain BMP/CBC in one week to ensure renal function and WBC count stable/improved  Home Health: No Equipment/Devices: None  Discharge Condition: Stable CODE STATUS: Full code Diet recommendation: Heart healthy diet  History of present illness:  Todd Valdez is a 63 y.o. male with past medical history significant for HTN, rheumatoid arthritis on MTX/Orencia , bronchiectasis, possible underlying COPD, HLD, GERD, melanoma who presented to [facility] ED on 08/26/2024 with fever and cough, exertional dyspnea and generalized unwell feeling.  Recently hospitalized for pneumonia and discharged to complete 7-day course and recently followed up with PCP who prescribed an additional regimen of Levaquin .   In the ED, temperature 100.3 F, HR 103, BP 78/42, SpO2 95% on room air.  WBC 19.4, hemoglobin 11.8, platelet count 411.  Sodium 138, potassium 5.1, chloride 102, CO2 22, glucose 94, BUN 39, creat 1.88.  AST 43, ALT 12, total bilirubin 0.7.  BNP 155.0.  High sensitive troponin 25 followed by 21.  Lactic acid 1.0.  COVID/influenza/RSV PCR negative.  Urinalysis unrevealing.  Chest x-ray with mild/moderate severity bibasilar atelectasis and or  infiltrate, right greater than left, small right pleural fusion.  Blood cultures x 2 obtained.  Patient was given IV fluids, started on IV ceftriaxone  and azithromycin .  TRH consulted for admission for further evaluation and management of pneumonia.  Hospital course:  Severe sepsis, POA Community-acquired pneumonia Patient presenting with exertional dyspnea, cough, fever and generalized unwell feeling.  Recently hospitalized and treated for community acquired pneumonia with doxycycline  and ceftriaxone /cefadroxil  and upon follow-up with PCP given additional course of antibiotics with Levaquin .  Patient reports recurrent fever, exertional dyspnea and generalized unwell feeling.  Patient with elevated WBC count of 19.4, tachycardic, hypotensive; and AKI.  Lactic acid 1.0.  Chest x-ray with mild/moderate severity bibasilar atelectasis and or infiltrate, right greater than left, small right pleural fusion.  Patient was started on azithromycin  and ceftriaxone  with improvement during his hospitalization.  Patient was weaned off of supplemental oxygen with no desaturation on ambulation.  Will discharge home on Levaquin  750 mg p.o. daily to complete 14-day antibiotic course given recurrence of pneumonia.  Recommend continue to hold methotrexate , Orencia  as likely immunosuppression complicating factors.  Has outpatient follow-up with pulmonology scheduled on 09/02/2024.  Outpatient follow-up with PCP.   Acute renal failure: Resolved. Creatinine elevated 1.88 on admission.  Likely secondary to prerenal azotemia in the setting of dehydration and ATN from hypotension.  Patient's home hydrochlorothiazide  and lisinopril  were held with improvement of blood pressure.  Blood pressure remained stable off of antihypertensive and recommend holding HCTZ and lisinopril  on time of discharge.  Patient instructed to maintain BP log and bring to next PCP visit.   HTN Patient was notably hypotensive on admission; home  HCTZ/lisinopril  held.  Patient's blood pressure improved and remained stable off of antihypertensives during his hospitalization.  Recommend to continue to hold and hypertensives and to follow-up with PCP for further  guidance.  Patient instructed maintain BP log on discharge.   Rheumatoid arthritis On methotrexate  and Orencia  outpatient. Hold immunosuppressants in the setting of active infection as above; but may resume prednisone  at discharge.   History of bronchiectasis Concern for underlying COPD Continue Trelegy inhaler  HLD Atorvastatin  40 mg p.o. daily, Fenofibrate  160 mg p.o. daily, Repatha     GERD Nexium   Discharge Diagnoses:  Principal Problem:   Community acquired pneumonia    Discharge Instructions  Discharge Instructions     Call MD for:  difficulty breathing, headache or visual disturbances   Complete by: As directed    Call MD for:  extreme fatigue   Complete by: As directed    Call MD for:  persistant dizziness or light-headedness   Complete by: As directed    Call MD for:  persistant nausea and vomiting   Complete by: As directed    Call MD for:  severe uncontrolled pain   Complete by: As directed    Call MD for:  temperature >100.4   Complete by: As directed    Diet - low sodium heart healthy   Complete by: As directed    Increase activity slowly   Complete by: As directed       Allergies as of 08/30/2024   No Known Allergies      Medication List     PAUSE taking these medications    doxycycline  50 MG tablet Wait to take this until your doctor or other care provider tells you to start again. Commonly known as: ADOXA Take 50 mg by mouth daily with breakfast.   lisinopril -hydrochlorothiazide  20-25 MG tablet Wait to take this until your doctor or other care provider tells you to start again. Commonly known as: ZESTORETIC  Take 1 tablet by mouth daily.   methotrexate  250 MG/10ML injection Wait to take this until your doctor or other care  provider tells you to start again. Inject 1 mL (25 mg total) into the muscle once a week.   Orencia  ClickJect 125 MG/ML Soaj Wait to take this until your doctor or other care provider tells you to start again. Generic drug: Abatacept  Inject 125mg  Subcutaneous once a week What changed:  how much to take how to take this when to take this       TAKE these medications    acetaminophen  325 MG tablet Commonly known as: TYLENOL  Take 325-650 mg by mouth daily as needed for mild pain (pain score 1-3) or moderate pain (pain score 4-6).   albuterol  108 (90 Base) MCG/ACT inhaler Commonly known as: VENTOLIN  HFA Inhale 2 puffs into the lungs every 6 (six) hours as needed for wheezing or shortness of breath.   atorvastatin  40 MG tablet Commonly known as: LIPITOR Take 40 mg by mouth daily.   cyclobenzaprine  10 MG tablet Commonly known as: FLEXERIL  Take 1 tablet (10 mg total) by mouth 3 (three) times daily as needed for muscle spasms.   esomeprazole  40 MG capsule Commonly known as: NEXIUM  Take 1 capsule (40 mg total) by mouth daily at 12 noon. What changed: when to take this   fenofibrate  160 MG tablet Take 160 mg by mouth daily.   folic acid  1 MG tablet Commonly known as: FOLVITE  Take 1 tablet (1 mg total) by mouth daily.   guaiFENesin  600 MG 12 hr tablet Commonly known as: MUCINEX  Take 600 mg by mouth 2 (two) times daily as needed for to loosen phlegm or cough.   HYDROcodone  bit-homatropine 5-1.5 MG/5ML syrup Commonly  known as: Hydromet Take 5 mLs by mouth every 6 (six) hours as needed for cough.   levofloxacin  750 MG tablet Commonly known as: Levaquin  Take 1 tablet (750 mg total) by mouth daily for 10 days.   niacin  500 MG tablet Commonly known as: (VITAMIN B3) Take 1 tablet (500 mg total) by mouth at bedtime.   omega-3 acid ethyl esters 1 g capsule Commonly known as: LOVAZA  Take 1 g by mouth 2 (two) times daily.   ondansetron  8 MG disintegrating tablet Commonly  known as: ZOFRAN -ODT Take 1 tablet (8 mg total) by mouth every 8 (eight) hours as needed for nausea. What changed: reasons to take this   predniSONE  5 MG tablet Commonly known as: DELTASONE  Take 5 mg by mouth daily with breakfast. What changed: Another medication with the same name was removed. Continue taking this medication, and follow the directions you see here.   Repatha  SureClick 140 MG/ML Soaj Generic drug: Evolocumab  Inject 140 mg into the skin every 14 (fourteen) days.   sodium chloride  HYPERTONIC 3 % nebulizer solution Take by nebulization 2 (two) times daily as needed. What changed:  how much to take reasons to take this   Trelegy Ellipta  200-62.5-25 MCG/ACT Aepb Generic drug: Fluticasone -Umeclidin-Vilant Inhale 1 puff into the lungs in the morning.        Follow-up Information     Alvia Bring, DO. Schedule an appointment as soon as possible for a visit in 1 week(s).   Specialty: Family Medicine Contact information: [address]  [address] [place] [address] [phone]         Charley Conger, PA-C. Go on 09/02/2024.   Specialties: Physician Assistant, Pulmonary Disease Contact information: [address] [place] [address] [phone]                No Known Allergies  Consultations: None   Procedures/Studies: DG Chest Port 1 View if patient is in a treatment room. Result Date: 08/26/2024 CLINICAL DATA:  Fever and generally unwell feeling with exertional dyspnea. EXAM: PORTABLE CHEST 1 VIEW COMPARISON:  July 23, 2024 FINDINGS: The heart size and mediastinal contours are within normal limits. Low lung volumes are noted. Mild to moderate severity areas of atelectasis and/or infiltrate are seen within the bilateral lung bases, right greater than left. This is mildly increased in severity when compared to the prior study. There is a small right pleural effusion. No pneumothorax is identified. The visualized  skeletal structures are unremarkable. IMPRESSION: 1. Mild to moderate severity bibasilar atelectasis and/or infiltrate, right greater than left. 2. Small right pleural effusion. Electronically Signed   By: Suzen Dials M.D.   On: 08/26/2024 17:10     Subjective: Patient seen examined bedside, sitting in bedside chair.  Has walked around the unit multiple times without oxygen with no symptoms.  Ready for discharge home.  Discussed holding home Orencia  and methotrexate  until follows up with outpatient providers given immunosuppression likely causing recurrent pneumonia.  Also instructed to hold his home hydrochlorothiazide  and lisinopril  given that he was hypotensive on admission and blood pressure remained stable/well-controlled off of antihypertensive therapy while inpatient.  Patient with no other questions or concerns at this time.  Has outpatient follow-up scheduled with pulmonology on 09/02/2024.  Patient denies headache, no dizziness, no chest pain, no palpitations, no shortness of breath, no abdominal pain, no fever/chills/night sweats, no nausea cefonicid diarrhea, no focal weakness, no fatigue, no paresthesias.  No acute events overnight per nursing staff.  Discharge Exam: Vitals:   08/30/24 0452 08/30/24 0838  BP: 125/73   Pulse: 79 78  Resp: 16   Temp: 97.7 F (36.5 C)   SpO2: 97%    Vitals:   08/29/24 1403 08/29/24 2013 08/30/24 0452 08/30/24 0838  BP:  109/62 125/73   Pulse:  95 79 78  Resp:  20 16   Temp:  97.9 F (36.6 C) 97.7 F (36.5 C)   TempSrc:  Oral Oral   SpO2: 92% 99% 97%   Weight:   93.6 kg   Height:        Physical Exam: GEN: NAD, alert and oriented x 3, wd/wn HEENT: NCAT, PERRL, EOMI, sclera clear, MMM PULM: CTAB w/ w/r/r, normal respiratory effort without accessory muscle use, on room air with SpO2 97% at rest CV: RRR w/o M/G/R GI: abd soft, NTND, + BS MSK: no peripheral edema, moves all extremities independently; severe RA deformity to right hand  noted, noted right foot TMA NEURO: CN II-XII intact, no focal deficits, sensation to light touch intact PSYCH: normal mood/affect Integumentary: dry/intact, no rashes or wounds    The results of significant diagnostics from this hospitalization (including imaging, microbiology, ancillary and laboratory) are listed below for reference.     Microbiology: Recent Results (from the past 240 hours)  Culture, blood (Routine x 2)     Status: None (Preliminary result)   Collection Time: 08/26/24  4:28 PM   Specimen: BLOOD LEFT FOREARM  Result Value Ref Range Status   Specimen Description   Final    BLOOD LEFT FOREARM Performed at [facility], [address], [place], [place] [address]    Special Requests   Final    BOTTLES DRAWN AEROBIC AND ANAEROBIC Blood Culture adequate volume Performed at [facility], [address] [place], [place] [address]    Culture   Final    NO GROWTH 4 DAYS Performed at [facility], [address], [place], [place] [address]    Report Status PENDING  Incomplete  Culture, blood (Routine x 2)     Status: None (Preliminary result)   Collection Time: 08/26/24  4:28 PM   Specimen: BLOOD RIGHT FOREARM  Result Value Ref Range Status   Specimen Description   Final    BLOOD RIGHT FOREARM Performed at [facility], [address] [place], [place] [address]    Special Requests   Final    BOTTLES DRAWN AEROBIC AND ANAEROBIC Blood Culture adequate volume Performed at [facility], [address] [place], [place] [address]    Culture   Final    NO GROWTH 4 DAYS Performed at [facility], [address], [place], [place] [address]    Report Status PENDING  Incomplete  Resp panel by RT-PCR (RSV, Flu A&B, Covid) Anterior Nasal Swab     Status: None   Collection Time: 08/26/24  4:28 PM   Specimen: Anterior Nasal Swab  Result Value Ref Range Status   SARS Coronavirus 2 by RT PCR NEGATIVE NEGATIVE  Final    Comment: (NOTE) SARS-CoV-2 target nucleic acids are NOT DETECTED.  The SARS-CoV-2 RNA is generally detectable in upper respiratory specimens during the acute phase of infection. The lowest concentration of SARS-CoV-2 viral copies this assay can detect is 138 copies/mL. A negative result does not preclude SARS-Cov-2 infection and should not be used as the sole basis for treatment or other patient management decisions. A negative result  may occur with  improper specimen collection/handling, submission of specimen other than nasopharyngeal swab, presence of viral mutation(s) within the areas targeted by this assay, and inadequate number of viral copies(<138 copies/mL). A negative result must be combined with clinical observations, patient history, and epidemiological information. The expected result is Negative.  Fact Sheet for Patients:  bloggercourse.com  Fact Sheet for Healthcare Providers:  seriousbroker.it  This test is no t yet approved or cleared by the [place]  FDA and  has been authorized for detection and/or diagnosis of SARS-CoV-2 by FDA under an Emergency Use Authorization (EUA). This EUA will remain  in effect (meaning this test can be used) for the duration of the COVID-19 declaration under Section 564(b)(1) of the Act, 21 U.S.C.section 360bbb-3(b)(1), unless the authorization is terminated  or revoked sooner.       Influenza A by PCR NEGATIVE NEGATIVE Final   Influenza B by PCR NEGATIVE NEGATIVE Final    Comment: (NOTE) The Xpert Xpress SARS-CoV-2/FLU/RSV plus assay is intended as an aid in the diagnosis of influenza from Nasopharyngeal swab specimens and should not be used as a sole basis for treatment. Nasal washings and aspirates are unacceptable for Xpert Xpress SARS-CoV-2/FLU/RSV testing.  Fact Sheet for Patients: bloggercourse.com  Fact Sheet for Healthcare  Providers: seriousbroker.it  This test is not yet approved or cleared by the [place]  FDA and has been authorized for detection and/or diagnosis of SARS-CoV-2 by FDA under an Emergency Use Authorization (EUA). This EUA will remain in effect (meaning this test can be used) for the duration of the COVID-19 declaration under Section 564(b)(1) of the Act, 21 U.S.C. section 360bbb-3(b)(1), unless the authorization is terminated or revoked.     Resp Syncytial Virus by PCR NEGATIVE NEGATIVE Final    Comment: (NOTE) Fact Sheet for Patients: bloggercourse.com  Fact Sheet for Healthcare Providers: seriousbroker.it  This test is not yet approved or cleared by the [place]  FDA and has been authorized for detection and/or diagnosis of SARS-CoV-2 by FDA under an Emergency Use Authorization (EUA). This EUA will remain in effect (meaning this test can be used) for the duration of the COVID-19 declaration under Section 564(b)(1) of the Act, 21 U.S.C. section 360bbb-3(b)(1), unless the authorization is terminated or revoked.  Performed at [facility], [address], [place], [place] [address]      Labs: BNP (last 3 results) Recent Labs    08/12/24 1131  BNP 13.9   Basic Metabolic Panel: Recent Labs  Lab 08/26/24 1628 08/27/24 0416 08/28/24 0446 08/29/24 0410 08/30/24 0434  NA 138 138 140 140 139  K 5.1 4.2 4.4 4.3 4.2  CL 102 103 104 106 104  CO2 22 23 26 24 23   GLUCOSE 94 95 114* 112* 92  BUN 39* 29* 27* 26* 25*  CREATININE 1.88* 1.45* 1.22 1.08 1.08  CALCIUM  10.6* 10.4* 11.1* 10.5* 11.1*   Liver Function Tests: Recent Labs  Lab 08/26/24 1628  AST 43*  ALT 12  ALKPHOS 109  BILITOT 0.7  PROT 7.6  ALBUMIN 3.6   No results for input(s): LIPASE, AMYLASE in the last 168 hours. No results for input(s): AMMONIA in the last 168 hours. CBC: Recent Labs  Lab  08/26/24 1628 08/28/24 0446 08/29/24 0410 08/30/24 0434  WBC 19.4* 12.3* 12.8* 14.3*  NEUTROABS 15.6*  --   --   --   HGB 11.8* 9.2* 9.4* 10.0*  HCT 37.6* 29.3* 30.1* 31.9*  MCV 88.7 88.8 88.5 88.6  PLT 411*  351 380 422*   Cardiac Enzymes: No results for input(s): CKTOTAL, CKMB, CKMBINDEX, TROPONINI in the last 168 hours. BNP: Invalid input(s): POCBNP CBG: No results for input(s): GLUCAP in the last 168 hours. D-Dimer No results for input(s): DDIMER in the last 72 hours. Hgb A1c No results for input(s): HGBA1C in the last 72 hours. Lipid Profile No results for input(s): CHOL, HDL, LDLCALC, TRIG, CHOLHDL, LDLDIRECT in the last 72 hours. Thyroid  function studies No results for input(s): TSH, T4TOTAL, T3FREE, THYROIDAB in the last 72 hours.  Invalid input(s): FREET3 Anemia work up No results for input(s): VITAMINB12, FOLATE, FERRITIN, TIBC, IRON, RETICCTPCT in the last 72 hours. Urinalysis    Component Value Date/Time   COLORURINE AMBER (A) 08/26/2024 1552   APPEARANCEUR HAZY (A) 08/26/2024 1552   LABSPEC 1.027 08/26/2024 1552   PHURINE 5.0 08/26/2024 1552   GLUCOSEU NEGATIVE 08/26/2024 1552   HGBUR NEGATIVE 08/26/2024 1552   BILIRUBINUR NEGATIVE 08/26/2024 1552   BILIRUBINUR neg 09/14/2011 1654   KETONESUR 5 (A) 08/26/2024 1552   PROTEINUR NEGATIVE 08/26/2024 1552   UROBILINOGEN 0.2 09/14/2011 1654   NITRITE NEGATIVE 08/26/2024 1552   LEUKOCYTESUR NEGATIVE 08/26/2024 1552   Sepsis Labs Recent Labs  Lab 08/26/24 1628 08/28/24 0446 08/29/24 0410 08/30/24 0434  WBC 19.4* 12.3* 12.8* 14.3*   Microbiology Recent Results (from the past 240 hours)  Culture, blood (Routine x 2)     Status: None (Preliminary result)   Collection Time: 08/26/24  4:28 PM   Specimen: BLOOD LEFT FOREARM  Result Value Ref Range Status   Specimen Description   Final    BLOOD LEFT FOREARM Performed at [facility], [address], [place], [place] [address]    Special Requests   Final    BOTTLES DRAWN AEROBIC AND ANAEROBIC Blood Culture adequate volume Performed at [facility], [address], [place], [place] [address]    Culture   Final    NO GROWTH 4 DAYS Performed at [facility], [address] [place], [place] [address]    Report Status PENDING  Incomplete  Culture, blood (Routine x 2)     Status: None (Preliminary result)   Collection Time: 08/26/24  4:28 PM   Specimen: BLOOD RIGHT FOREARM  Result Value Ref Range Status   Specimen Description   Final    BLOOD RIGHT FOREARM Performed at [facility], [address], [place], [place] [address]    Special Requests   Final    BOTTLES DRAWN AEROBIC AND ANAEROBIC Blood Culture adequate volume Performed at [facility], [address] [place], [place] [address]    Culture   Final    NO GROWTH 4 DAYS Performed at [facility], [address] [place], [place] [address]    Report Status PENDING  Incomplete  Resp panel by RT-PCR (RSV, Flu A&B, Covid) Anterior Nasal Swab     Status: None   Collection Time: 08/26/24  4:28 PM   Specimen: Anterior Nasal Swab  Result Value Ref Range Status   SARS Coronavirus 2 by RT PCR NEGATIVE NEGATIVE Final    Comment: (NOTE) SARS-CoV-2 target nucleic acids are NOT DETECTED.  The SARS-CoV-2 RNA is generally detectable in upper respiratory specimens during the acute phase of infection. The lowest concentration of SARS-CoV-2 viral copies this assay can detect is 138 copies/mL. A negative result does not preclude SARS-Cov-2 infection and should not be used as the sole basis for  treatment or other patient management decisions. A negative result may occur with  improper specimen collection/handling, submission of specimen other than nasopharyngeal swab, presence of viral mutation(s) within the areas targeted by this assay, and inadequate number of viral copies(<138  copies/mL). A negative result must be combined with clinical observations, patient history, and epidemiological information. The expected result is Negative.  Fact Sheet for Patients:  bloggercourse.com  Fact Sheet for Healthcare Providers:  seriousbroker.it  This test is no t yet approved or cleared by the [place]  FDA and  has been authorized for detection and/or diagnosis of SARS-CoV-2 by FDA under an Emergency Use Authorization (EUA). This EUA will remain  in effect (meaning this test can be used) for the duration of the COVID-19 declaration under Section 564(b)(1) of the Act, 21 U.S.C.section 360bbb-3(b)(1), unless the authorization is terminated  or revoked sooner.       Influenza A by PCR NEGATIVE NEGATIVE Final   Influenza B by PCR NEGATIVE NEGATIVE Final    Comment: (NOTE) The Xpert Xpress SARS-CoV-2/FLU/RSV plus assay is intended as an aid in the diagnosis of influenza from Nasopharyngeal swab specimens and should not be used as a sole basis for treatment. Nasal washings and aspirates are unacceptable for Xpert Xpress SARS-CoV-2/FLU/RSV testing.  Fact Sheet for Patients: bloggercourse.com  Fact Sheet for Healthcare Providers: seriousbroker.it  This test is not yet approved or cleared by the [place]  FDA and has been authorized for detection and/or diagnosis of SARS-CoV-2 by FDA under an Emergency Use Authorization (EUA). This EUA will remain in effect (meaning this test can be used) for the duration of the COVID-19 declaration under Section 564(b)(1) of the Act, 21 U.S.C. section 360bbb-3(b)(1), unless the authorization is terminated or revoked.     Resp Syncytial Virus by PCR NEGATIVE NEGATIVE Final    Comment: (NOTE) Fact Sheet for Patients: bloggercourse.com  Fact Sheet for Healthcare  Providers: seriousbroker.it  This test is not yet approved or cleared by the [place]  FDA and has been authorized for detection and/or diagnosis of SARS-CoV-2 by FDA under an Emergency Use Authorization (EUA). This EUA will remain in effect (meaning this test can be used) for the duration of the COVID-19 declaration under Section 564(b)(1) of the Act, 21 U.S.C. section 360bbb-3(b)(1), unless the authorization is terminated or revoked.  Performed at [facility], [address] [place], [place] [address]      Time coordinating discharge: Over 30 minutes  SIGNED:   Camellia PARAS Khris Jansson, DO  [facility] 08/30/2024, 9:50 AM

## 2024-08-30 NOTE — Progress Notes (Signed)
 Discharge med ina secure bag delivered to patient in room by this RN

## 2024-09-01 LAB — CULTURE, BLOOD (ROUTINE X 2)
Culture: NO GROWTH
Culture: NO GROWTH
Special Requests: ADEQUATE
Special Requests: ADEQUATE

## 2024-09-02 ENCOUNTER — Other Ambulatory Visit: Payer: Self-pay

## 2024-09-02 ENCOUNTER — Ambulatory Visit (HOSPITAL_BASED_OUTPATIENT_CLINIC_OR_DEPARTMENT_OTHER)

## 2024-09-02 ENCOUNTER — Encounter (HOSPITAL_BASED_OUTPATIENT_CLINIC_OR_DEPARTMENT_OTHER): Payer: Self-pay

## 2024-09-02 VITALS — BP 118/78 | HR 96 | Ht 73.0 in | Wt 208.0 lb

## 2024-09-02 DIAGNOSIS — G4733 Obstructive sleep apnea (adult) (pediatric): Secondary | ICD-10-CM | POA: Diagnosis not present

## 2024-09-02 DIAGNOSIS — M069 Rheumatoid arthritis, unspecified: Secondary | ICD-10-CM | POA: Diagnosis not present

## 2024-09-02 DIAGNOSIS — J479 Bronchiectasis, uncomplicated: Secondary | ICD-10-CM

## 2024-09-02 DIAGNOSIS — J189 Pneumonia, unspecified organism: Secondary | ICD-10-CM

## 2024-09-02 DIAGNOSIS — J90 Pleural effusion, not elsewhere classified: Secondary | ICD-10-CM | POA: Diagnosis not present

## 2024-09-02 DIAGNOSIS — R0683 Snoring: Secondary | ICD-10-CM

## 2024-09-02 DIAGNOSIS — Z87891 Personal history of nicotine dependence: Secondary | ICD-10-CM

## 2024-09-02 DIAGNOSIS — I10 Essential (primary) hypertension: Secondary | ICD-10-CM

## 2024-09-02 NOTE — Progress Notes (Signed)
 @Patient  ID: Todd Valdez, male    DOB: 17-Sep-1961, 63 y.o.   MRN: [id number]  Chief Complaint  Patient presents with   Follow-up    Hospital     Referring provider: Alvia Bring, DO  HPI: Discussed the use of AI scribe software for clinical note transcription with the patient, who gave verbal consent to proceed.  Todd Valdez is a 63 y.o. male with past medical history significant for HTN, rheumatoid arthritis on MTX/Orencia , bronchiectasis, possible underlying COPD, HLD, GERD, melanoma who presents for follow up after recent admission on 08/26/2024 for severe sepsis 2nd to CAP.  This was his 2nd admission in the span of ~4 weeks for c/o shortness of breath, fever, and cough. History of Present Illness Todd Valdez is a 63 year old male with rheumatoid arthritis who presents with recent hospitalization for pneumonia. He is accompanied by his wife, Leita.  He was recently hospitalized twice for pneumonia. The first hospitalization lasted four days, followed by discharge. About a week and a half later, he experienced a recurrence of symptoms including shortness of breath and fever, leading to a second hospitalization. During this time, his blood pressure dropped significantly, prompting a visit to the emergency department. He was treated with antibiotics and other medications, and he feels much better now, although he still experiences some shortness of breath and a 'little tickle cough.' He was discharged on Saturday.  He is currently on a 10-day course of Levaquin  750 mg, with six days remaining. His rheumatoid arthritis medications, including Orencia  and methotrexate , have been put on hold, but he continues to take prednisone . His blood pressure medication has also been stopped as his blood pressure has normalized without it. During his hospital stay, his blood pressure was as low as 80/49 mmHg.  He has minimal sputum production, with a little mucus but nothing significant. Imaging studies,  including a chest x-ray and a CT scan, were performed; the patient recalls being told there was fluid in the right lung, first noted in October. He recalls being told that his lung had partially collapsed at the bottom during his hospitalization for pneumonia. He uses Trelegy daily and albuterol  as needed, and has access to a flutter valve and hypertonic saline nebulizers to aid in clearing his lungs.  He experienced dehydration during his hospital stay, which made blood draws challenging. He was given fluids and steroids to manage his condition. He monitors his blood pressure at home and keeps a log. He notes some swelling in his legs since being discharged, which he attributes to the fluids and steroids administered in the hospital.  He has a follow-up appointment with his pulmonologist on October 20, 2024, and is scheduled for pulmonary function tests on the same day.  Today he reports that he continues to feel better daily, but has not fully returned to his baseline.    Last OV 05/07/2024: Todd Valdez is a 63 year old male with bronchiectasis who presents for a follow-up regarding his respiratory symptoms. He is accompanied by his wife, Leita. He was referred by Dr. ONEIDA for evaluation of his respiratory condition.   He experiences a persistent cough, particularly during the winter, with episodes of bronchitis. Last year, his condition led to fluid and congestion in the lungs, requiring multiple medication courses. Despite treatment, he continues to have a cough and occasional off-white mucus production. He uses a Trelegy inhaler, which has significantly improved his symptoms. He experiences shortness of breath in heat and humidity, requiring breaks during  outdoor activities. He occasionally produces white mucus but no longer has a rattling sensation in his left lung.   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 He previously  required allergy  shots for dust allergies.   He has rheumatoid arthritis, managed with methotrexate , Orencia , and low-dose prednisone . 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    TEST/EVENTS :   05/13/2024 HST:  Severe OSA with AHI 4% of 68/hr and O2 sat nadir of 70%- titration study recommended  No Known Allergies  Immunization History  Administered Date(s) Administered   Influenza Split 07/30/2012   Influenza,inj,Quad PF,6+ Mos 07/10/2019   Influenza-Unspecified 07/09/2017, 08/09/2020, 07/19/2021, 07/10/2023   Pfizer Covid-19 Vaccine Bivalent Booster 60yrs & up 07/15/2021   Pneumococcal Conjugate-13 11/07/2019   Pneumococcal Polysaccharide-23 01/30/2020   Tdap 01/02/2017    Past Medical History:  Diagnosis Date   Arthritis    Cancer (HCC)    Melanoma on  back   Diverticulitis    GERD (gastroesophageal reflux disease)    High triglycerides    History of abscess of skin and subcutaneous tissue    right forearm, chin   Hypertension    RA (rheumatoid arthritis) (HCC)    Reflux esophagitis    Right inguinal hernia    Sleep apnea    no cpap    Tobacco History: Social History   Tobacco Use  Smoking Status Former   Current packs/day: 0.00   Types: Cigarettes   Quit date: 02/07/1991   Years since quitting: 33.5  Smokeless Tobacco Former   Quit date: 08/14/2009   Counseling given: Not Answered   Outpatient Medications Prior to Visit  Medication Sig Dispense Refill   Abatacept  (ORENCIA  CLICKJECT) 125 MG/ML SOAJ Inject 125mg  Subcutaneous once a week (Patient taking differently: Inject 125 mg into the skin every Friday.) 4 mL 4   acetaminophen  (TYLENOL ) 325 MG tablet Take 325-650 mg by mouth daily as needed for mild pain (pain score 1-3) or moderate pain (pain score 4-6).     albuterol  (VENTOLIN  HFA) 108 (90 Base) MCG/ACT inhaler Inhale 2 puffs into the lungs every 6 (six) hours as needed for wheezing or shortness of breath.  6.7 g 6   atorvastatin  (LIPITOR) 40 MG tablet Take 40 mg by mouth daily.     cyclobenzaprine  (FLEXERIL ) 10 MG tablet Take 1 tablet (10 mg total) by mouth 3 (three) times daily as needed for muscle spasms. 30 tablet 0   doxycycline  (ADOXA) 50 MG tablet Take 50 mg by mouth daily with breakfast.     esomeprazole  (NEXIUM ) 40 MG capsule Take 1 capsule (40 mg total) by mouth daily at 12 noon. (Patient taking differently: Take 40 mg by mouth daily before breakfast.) 90 capsule 3   Evolocumab  (REPATHA  SURECLICK) 140 MG/ML SOAJ Inject 140 mg into the skin every 14 (fourteen) days. 6 mL 11   fenofibrate  160 MG tablet Take 160 mg by mouth daily.     folic acid  (FOLVITE ) 1 MG tablet Take 1 tablet (1 mg total) by mouth daily. 90 tablet 2   guaiFENesin  (MUCINEX ) 600 MG 12 hr tablet Take 600 mg by mouth 2 (two) times daily as needed for to loosen phlegm or cough.     HYDROcodone  bit-homatropine (HYDROMET) 5-1.5 MG/5ML syrup Take 5 mLs by mouth every 6 (six) hours as needed for cough. 150 mL 0   levofloxacin  (LEVAQUIN ) 750 MG tablet  Take 1 tablet (750 mg total) by mouth daily for 10 days. 10 tablet 0   lisinopril -hydrochlorothiazide  (ZESTORETIC ) 20-25 MG tablet Take 1 tablet by mouth daily. 90 tablet 1   methotrexate  250 MG/10ML injection Inject 1 mL (25 mg total) into the muscle once a week. 120 mL 1   niacin  (,VITAMIN B3,) 500 MG tablet Take 1 tablet (500 mg total) by mouth at bedtime. 100 tablet 0   omega-3 acid ethyl esters (LOVAZA ) 1 g capsule Take 1 g by mouth 2 (two) times daily.     ondansetron  (ZOFRAN -ODT) 8 MG disintegrating tablet Take 1 tablet (8 mg total) by mouth every 8 (eight) hours as needed for nausea. (Patient taking differently: Take 8 mg by mouth every 8 (eight) hours as needed for nausea (dissolve orally).) 20 tablet 3   predniSONE  (DELTASONE ) 5 MG tablet Take 5 mg by mouth daily with breakfast.     sodium chloride  HYPERTONIC 3 % nebulizer solution Take by nebulization 2 (two) times daily as  needed. (Patient taking differently: Take 4 mLs by nebulization 2 (two) times daily as needed for cough.) 750 mL 12   TRELEGY ELLIPTA  200-62.5-25 MCG/ACT AEPB Inhale 1 puff into the lungs in the morning.     No facility-administered medications prior to visit.     Review of Systems: as per hpi  Constitutional:   No  weight loss, night sweats,  Fevers, chills, fatigue, or  lassitude.  HEENT:   No headaches,  Difficulty swallowing,  Tooth/dental problems, or  Sore throat,                No sneezing, itching, ear ache, nasal congestion, post nasal drip,   CV:  No chest pain,  Orthopnea, PND, swelling in lower extremities, anasarca, dizziness, palpitations, syncope.   GI  No heartburn, indigestion, abdominal pain, nausea, vomiting, diarrhea, change in bowel habits, loss of appetite, bloody stools.   Resp: No shortness of breath with exertion or at rest.  No excess mucus, no productive cough,  No non-productive cough,  No coughing up of blood.  No change in color of mucus.  No wheezing.  No chest wall deformity  Skin: no rash or lesions.  GU: no dysuria, change in color of urine, no urgency or frequency.  No flank pain, no hematuria   MS:  No joint pain or swelling.  No decreased range of motion.  No back pain.    Physical Exam  BP 118/78   Pulse 96   Ht 6' 1 (1.854 m)   Wt 208 lb (94.3 kg)   SpO2 96%   BMI 27.44 kg/m   GEN: A/Ox3; pleasant , NAD, well nourished    HEENT:  [place]/AT,  EACs-clear, TMs-wnl, NOSE-clear, THROAT-clear, no lesions, no postnasal drip or exudate noted.   NECK:  Supple w/ fair ROM; no JVD; normal carotid impulses w/o bruits; no thyromegaly or nodules palpated; no lymphadenopathy.    RESP  Clear  P & A with diminished breath sounds in RML and RLL; w/o, wheezes/ rales/ or rhonchi. no accessory muscle use, dullness to percussion of RML and RLL  CARD:  RRR, no m/r/g, no peripheral edema, pulses intact, no cyanosis or clubbing.  GI:   Soft & nt; nml bowel  sounds; no organomegaly or masses detected.   Musco: Warm bil, no deformities or joint swelling noted.   Neuro: alert, no focal deficits noted.    Skin: Warm, no lesions or rashes    Lab Results:  CBC  Component Value Date/Time   WBC 14.3 (H) 08/30/2024 0434   RBC 3.60 (L) 08/30/2024 0434   HGB 10.0 (L) 08/30/2024 0434   HGB 11.8 (L) 08/12/2024 1131   HCT 31.9 (L) 08/30/2024 0434   HCT 36.8 (L) 08/12/2024 1131   PLT 422 (H) 08/30/2024 0434   PLT 427 08/12/2024 1131   MCV 88.6 08/30/2024 0434   MCV 89 08/12/2024 1131   MCH 27.8 08/30/2024 0434   MCHC 31.3 08/30/2024 0434   RDW 14.6 08/30/2024 0434   RDW 13.9 08/12/2024 1131   LYMPHSABS 1.9 08/26/2024 1628   LYMPHSABS 2.5 08/12/2024 1131   MONOABS 1.5 (H) 08/26/2024 1628   EOSABS 0.2 08/26/2024 1628   EOSABS 0.5 (H) 08/12/2024 1131   BASOSABS 0.1 08/26/2024 1628   BASOSABS 0.1 08/12/2024 1131    BMET    Component Value Date/Time   NA 139 08/30/2024 0434   NA 139 08/12/2024 1131   K 4.2 08/30/2024 0434   CL 104 08/30/2024 0434   CO2 23 08/30/2024 0434   GLUCOSE 92 08/30/2024 0434   BUN 25 (H) 08/30/2024 0434   BUN 24 08/12/2024 1131   CREATININE 1.08 08/30/2024 0434   CREATININE 1.11 11/08/2022 0809   CALCIUM  11.1 (H) 08/30/2024 0434   GFRNONAA >60 08/30/2024 0434   GFRNONAA 76 11/07/2019 0922   GFRAA 88 11/07/2019 0922    BNP    Component Value Date/Time   BNP 13.9 08/12/2024 1131    ProBNP    Component Value Date/Time   PROBNP 155.0 08/26/2024 1628    Imaging: DG Chest Port 1 View if patient is in a treatment room. Result Date: 08/26/2024 CLINICAL DATA:  Fever and generally unwell feeling with exertional dyspnea. EXAM: PORTABLE CHEST 1 VIEW COMPARISON:  July 23, 2024 FINDINGS: The heart size and mediastinal contours are within normal limits. Low lung volumes are noted. Mild to moderate severity areas of atelectasis and/or infiltrate are seen within the bilateral lung bases, right greater  than left. This is mildly increased in severity when compared to the prior study. There is a small right pleural effusion. No pneumothorax is identified. The visualized skeletal structures are unremarkable. IMPRESSION: 1. Mild to moderate severity bibasilar atelectasis and/or infiltrate, right greater than left. 2. Small right pleural effusion. Electronically Signed   By: Suzen Dials M.D.   On: 08/26/2024 17:10    Administration History     None           No data to display          No results found for: NITRICOXIDE   Assessment & Plan:   Assessment & Plan Community acquired pneumonia of right lower lobe of lung  Bronchiectasis without complication (HCC)  Snoring  OSA (obstructive sleep apnea)  Assessment and Plan Assessment & Plan Pneumonia with right-sided pleural effusion Recurrent pneumonia with right-sided pleural effusion. Persistent unilateral effusion likely inflammatory 2nd to pneumonia but differential still includes RA or other inflammatory process. Imaging shows slow radiographic improvement.  - Continue Levaquin  for 6 more days. - Continue prednisone  5 mg daily. - Use Trelegy as prescribed. - Use albuterol  as needed every 4-6 hours. - Perform hypertonic saline nebulizations twice daily followed by flutter valve. - Increase activity, cough, and deep breathe. - Ordered CT chest 6-8 weeks post-admission to assess resolution of pleural effusion. - Consider bronchoscopy if symptoms worsen post-treatment to r/o resistant bacteria or postobstructive issue. - PFTs scheduled to occur before next follow up in January; may need to  hold on these pending progression/resolution of effusion  Sepsis syndrome secondary to pneumonia Sepsis syndrome secondary to pneumonia with hypotension and systemic inflammatory response. Blood cultures negative, likely SIRS. - Monitor for signs of sepsis recurrence, including fever, chills, difficulty breathing, and chest pain. -  Seek emergency care if symptoms of sepsis recur.  Rheumatoid arthritis Management on hold due to recent pneumonia and sepsis. RA medications paused. - Hold RA medications until two weeks post-antibiotic completion. - Follow up with rheumatology as scheduled.  Hypertension Management on hold due to hypotension during recent hospitalization. Blood pressure currently well-managed without medication. - Monitor blood pressure daily and maintain a log. - Resume blood pressure medication if blood pressure increases; follow closely with PCP for this.  OSA -  Patient wishes to hold on titration study and treatment at this time given current lung issues -  Recommend starting CPAP therapy given severity of OSA -  Readdress at follow up   Return for CT results.  Candis Dandy, PA-C 09/02/2024

## 2024-09-02 NOTE — Patient Instructions (Addendum)
-   Finish Levaquin  as prescribed. - Continue prednisone  5 mg daily. - Use Trelegy as prescribed. - Use albuterol  as needed every 4-6 hours. - Perform hypertonic saline nebulizations twice daily followed by flutter valve. - Increase activity, cough, and deep breathe. - Ordered CT chest 6-8 weeks post-admission to assess resolution of pleural effusion.  Keep follow up as scheduled in January with Dr. Kara.    Return to clinic sooner or go to ED if recurrent fever, chest pain, worsening shortness of breath, new or worsening symptoms.

## 2024-09-03 ENCOUNTER — Other Ambulatory Visit (HOSPITAL_COMMUNITY): Payer: Self-pay

## 2024-09-05 ENCOUNTER — Other Ambulatory Visit: Payer: Self-pay

## 2024-09-05 ENCOUNTER — Other Ambulatory Visit (HOSPITAL_BASED_OUTPATIENT_CLINIC_OR_DEPARTMENT_OTHER): Payer: Self-pay

## 2024-09-08 ENCOUNTER — Other Ambulatory Visit (HOSPITAL_BASED_OUTPATIENT_CLINIC_OR_DEPARTMENT_OTHER): Payer: Self-pay

## 2024-09-08 ENCOUNTER — Other Ambulatory Visit: Payer: Self-pay

## 2024-09-08 MED ORDER — DOXYCYCLINE MONOHYDRATE 50 MG PO TABS
50.0000 mg | ORAL_TABLET | Freq: Every day | ORAL | 3 refills | Status: DC
  Filled 2024-09-08: qty 60, 60d supply, fill #0

## 2024-09-09 ENCOUNTER — Other Ambulatory Visit (HOSPITAL_BASED_OUTPATIENT_CLINIC_OR_DEPARTMENT_OTHER): Payer: Self-pay

## 2024-09-10 ENCOUNTER — Other Ambulatory Visit

## 2024-09-11 ENCOUNTER — Other Ambulatory Visit (HOSPITAL_BASED_OUTPATIENT_CLINIC_OR_DEPARTMENT_OTHER): Payer: Self-pay

## 2024-09-11 DIAGNOSIS — M0589 Other rheumatoid arthritis with rheumatoid factor of multiple sites: Secondary | ICD-10-CM | POA: Diagnosis not present

## 2024-09-14 DIAGNOSIS — J479 Bronchiectasis, uncomplicated: Secondary | ICD-10-CM | POA: Diagnosis not present

## 2024-09-15 ENCOUNTER — Other Ambulatory Visit (HOSPITAL_BASED_OUTPATIENT_CLINIC_OR_DEPARTMENT_OTHER): Payer: Self-pay

## 2024-09-15 ENCOUNTER — Other Ambulatory Visit: Payer: Self-pay

## 2024-09-17 ENCOUNTER — Other Ambulatory Visit (HOSPITAL_COMMUNITY): Payer: Self-pay

## 2024-09-18 ENCOUNTER — Other Ambulatory Visit: Payer: Self-pay

## 2024-09-18 ENCOUNTER — Other Ambulatory Visit (HOSPITAL_BASED_OUTPATIENT_CLINIC_OR_DEPARTMENT_OTHER): Payer: Self-pay

## 2024-09-18 ENCOUNTER — Other Ambulatory Visit: Payer: Self-pay | Admitting: Family Medicine

## 2024-09-18 DIAGNOSIS — E782 Mixed hyperlipidemia: Secondary | ICD-10-CM

## 2024-09-18 MED ORDER — FENOFIBRATE 160 MG PO TABS
160.0000 mg | ORAL_TABLET | Freq: Every day | ORAL | 2 refills | Status: AC
  Filled 2024-09-18: qty 90, 90d supply, fill #0

## 2024-09-18 MED FILL — Esomeprazole Magnesium Cap Delayed Release 40 MG (Base Eq): ORAL | 90 days supply | Qty: 90 | Fill #1 | Status: AC

## 2024-09-22 ENCOUNTER — Other Ambulatory Visit (HOSPITAL_BASED_OUTPATIENT_CLINIC_OR_DEPARTMENT_OTHER): Payer: Self-pay

## 2024-09-22 ENCOUNTER — Other Ambulatory Visit: Payer: Self-pay

## 2024-09-24 ENCOUNTER — Other Ambulatory Visit (HOSPITAL_BASED_OUTPATIENT_CLINIC_OR_DEPARTMENT_OTHER): Payer: Self-pay

## 2024-09-29 ENCOUNTER — Other Ambulatory Visit: Payer: Self-pay | Admitting: Family Medicine

## 2024-09-29 ENCOUNTER — Ambulatory Visit

## 2024-09-29 ENCOUNTER — Other Ambulatory Visit (HOSPITAL_BASED_OUTPATIENT_CLINIC_OR_DEPARTMENT_OTHER): Payer: Self-pay

## 2024-09-29 ENCOUNTER — Other Ambulatory Visit: Payer: Self-pay

## 2024-09-29 DIAGNOSIS — J189 Pneumonia, unspecified organism: Secondary | ICD-10-CM | POA: Diagnosis not present

## 2024-09-29 DIAGNOSIS — R053 Chronic cough: Secondary | ICD-10-CM

## 2024-09-29 DIAGNOSIS — E785 Hyperlipidemia, unspecified: Secondary | ICD-10-CM

## 2024-09-29 DIAGNOSIS — J439 Emphysema, unspecified: Secondary | ICD-10-CM

## 2024-09-30 ENCOUNTER — Telehealth (HOSPITAL_BASED_OUTPATIENT_CLINIC_OR_DEPARTMENT_OTHER): Payer: Self-pay

## 2024-09-30 ENCOUNTER — Other Ambulatory Visit: Payer: Self-pay

## 2024-09-30 ENCOUNTER — Other Ambulatory Visit: Payer: Self-pay | Admitting: Family Medicine

## 2024-09-30 ENCOUNTER — Other Ambulatory Visit (HOSPITAL_BASED_OUTPATIENT_CLINIC_OR_DEPARTMENT_OTHER): Payer: Self-pay

## 2024-09-30 ENCOUNTER — Ambulatory Visit (HOSPITAL_BASED_OUTPATIENT_CLINIC_OR_DEPARTMENT_OTHER): Payer: Self-pay

## 2024-09-30 ENCOUNTER — Ambulatory Visit: Attending: Family Medicine | Admitting: Pharmacist

## 2024-09-30 DIAGNOSIS — Z79899 Other long term (current) drug therapy: Secondary | ICD-10-CM

## 2024-09-30 DIAGNOSIS — J189 Pneumonia, unspecified organism: Secondary | ICD-10-CM

## 2024-09-30 DIAGNOSIS — E785 Hyperlipidemia, unspecified: Secondary | ICD-10-CM

## 2024-09-30 DIAGNOSIS — J439 Emphysema, unspecified: Secondary | ICD-10-CM

## 2024-09-30 DIAGNOSIS — R053 Chronic cough: Secondary | ICD-10-CM

## 2024-09-30 MED ORDER — ATORVASTATIN CALCIUM 40 MG PO TABS
40.0000 mg | ORAL_TABLET | Freq: Every day | ORAL | 1 refills | Status: AC
  Filled 2024-09-30: qty 90, 90d supply, fill #0

## 2024-09-30 MED ORDER — TRELEGY ELLIPTA 200-62.5-25 MCG/ACT IN AEPB
1.0000 | INHALATION_SPRAY | Freq: Every day | RESPIRATORY_TRACT | 6 refills | Status: AC
  Filled 2024-09-30: qty 60, 30d supply, fill #0
  Filled 2024-10-28: qty 60, 30d supply, fill #1

## 2024-09-30 NOTE — Telephone Encounter (Signed)
 Spoke with patient regarding results of Chest CT (see below):  IMPRESSION: 1. Masslike consolidation in the posterior right lower lobe abutting a small layering right pleural effusion, measuring 7.2 x 4.2 cm, with associated internal low attenuation, surrounding curvilinear parenchymal banding, volume loss and distortion, new from recent 07/29/24 chest CT. Findings are most suggestive of evolving organizing pneumonia, with a central pulmonary abscess not excluded in this location given the internal low attenuation. Further evaluation with chest CT with IV contrast could be obtained at this time or as a 3 month follow-up study, as clinically warranted. 2. No evidence of interstitial lung disease. 3. Mild centrilobular and paraseptal emphysema. 4. Moderate hiatal hernia. 5. Three-vessel coronary atherosclerosis. 6. Aortic Atherosclerosis (ICD10-I70.0). 7. Emphysema (ICD10-J43.9).   Patient reports that he feels like he is back to baseline.  He reports an intermittent, nonproductive cough only.  He denies fever, chills, chest pain, hemoptysis, productive cough, dizziness/lightheadedness, near syncope, appetite changes, night sweats or weight loss.  He reports that his blood pressure has normalized since his last visit.  Based on his Chest CT results, a Chest CT with contrast has been ordered to better characterize the findings and rule out abscess.  Clinically he presents as stable.  The patient is aware of his results and orders placed.  Plan for close follow up to review Chest CT.

## 2024-09-30 NOTE — Progress Notes (Signed)
" °  S: Patient presents for review of their specialty medication therapy.  Patient is currently taking Orencia  for rheumatoid arthritis. Patient is managed by Dr. Mai for this.   Adherence: denies any missed doses.   Efficacy: reports that it is still working very well for him  FDA-approved dosing: 125 mg once weekly   Drug-drug interactions: none  Screenings: TB screening: completed per patient Hepatitis Screening: completed per patient Blood glucose: Orencia  contains maltose which make falsely elevate glucose levels  Monitoring: S/sx of infection: denies.  S/sx of hypersensitivity: denies  Other adverse effects: denies  O:  Lab Results  Component Value Date   WBC 14.3 (H) 08/30/2024   HGB 10.0 (L) 08/30/2024   HCT 31.9 (L) 08/30/2024   MCV 88.6 08/30/2024   PLT 422 (H) 08/30/2024      Chemistry      Component Value Date/Time   NA 139 08/30/2024 0434   NA 139 08/12/2024 1131   K 4.2 08/30/2024 0434   CL 104 08/30/2024 0434   CO2 23 08/30/2024 0434   BUN 25 (H) 08/30/2024 0434   BUN 24 08/12/2024 1131   CREATININE 1.08 08/30/2024 0434   CREATININE 1.11 11/08/2022 0809      Component Value Date/Time   CALCIUM  11.1 (H) 08/30/2024 0434   ALKPHOS 109 08/26/2024 1628   AST 43 (H) 08/26/2024 1628   ALT 12 08/26/2024 1628   BILITOT 0.7 08/26/2024 1628   BILITOT 0.3 08/12/2024 1131       A/P: 1. Medication review: Patient currently on Orencia  for the treatment of rheumatoid arthritis and is tolerating it well with continued improved control of RA. Reviewed the medication with the patient, including the following: Orencia  is a selective T-cell costimulation blocker indicated for rheumatoid arthritis. The most common adverse effects are infections, headache, and injection site reactions. There is a possible adverse effect of increased risk of malignancy but it is not fully understood if this is due to the drug or the disease state itself. The patient was instructed  to avoid use of live vaccinations without the approval of a physician. No recommendations for any changes.  Herlene Fleeta Morris, PharmD, JAQUELINE, CPP Clinical Pharmacist [facility] [phone]      "

## 2024-09-30 NOTE — Progress Notes (Signed)
 Specialty Pharmacy Refill Coordination Note  Todd Valdez is a 63 y.o. male contacted today regarding refills of specialty medication(s) Abatacept  (Orencia  [employer])   Patient requested Delivery   Delivery date: 10/07/24   Verified address: [address] [place]   Medication will be filled on: 10/06/24

## 2024-10-06 ENCOUNTER — Other Ambulatory Visit: Payer: Self-pay

## 2024-10-07 ENCOUNTER — Encounter (HOSPITAL_COMMUNITY): Payer: Self-pay

## 2024-10-07 ENCOUNTER — Emergency Department (HOSPITAL_COMMUNITY)

## 2024-10-07 ENCOUNTER — Other Ambulatory Visit: Payer: Self-pay

## 2024-10-07 ENCOUNTER — Inpatient Hospital Stay (HOSPITAL_COMMUNITY)
Admission: EM | Admit: 2024-10-07 | Discharge: 2024-10-10 | DRG: 177 | Disposition: A | Discharge status: Home / Self Care | Attending: Internal Medicine | Admitting: Internal Medicine

## 2024-10-07 DIAGNOSIS — Z79631 Long term (current) use of antimetabolite agent: Secondary | ICD-10-CM

## 2024-10-07 DIAGNOSIS — K219 Gastro-esophageal reflux disease without esophagitis: Secondary | ICD-10-CM | POA: Diagnosis present

## 2024-10-07 DIAGNOSIS — Z79899 Other long term (current) drug therapy: Secondary | ICD-10-CM

## 2024-10-07 DIAGNOSIS — J852 Abscess of lung without pneumonia: Secondary | ICD-10-CM | POA: Diagnosis present

## 2024-10-07 DIAGNOSIS — M069 Rheumatoid arthritis, unspecified: Secondary | ICD-10-CM | POA: Diagnosis present

## 2024-10-07 DIAGNOSIS — Z8249 Family history of ischemic heart disease and other diseases of the circulatory system: Secondary | ICD-10-CM

## 2024-10-07 DIAGNOSIS — I10 Essential (primary) hypertension: Secondary | ICD-10-CM | POA: Diagnosis present

## 2024-10-07 DIAGNOSIS — Z1152 Encounter for screening for COVID-19: Secondary | ICD-10-CM

## 2024-10-07 DIAGNOSIS — Z8701 Personal history of pneumonia (recurrent): Secondary | ICD-10-CM

## 2024-10-07 DIAGNOSIS — J851 Abscess of lung with pneumonia: Principal | ICD-10-CM | POA: Diagnosis present

## 2024-10-07 DIAGNOSIS — Z87891 Personal history of nicotine dependence: Secondary | ICD-10-CM

## 2024-10-07 DIAGNOSIS — G4730 Sleep apnea, unspecified: Secondary | ICD-10-CM | POA: Diagnosis present

## 2024-10-07 DIAGNOSIS — Z7951 Long term (current) use of inhaled steroids: Secondary | ICD-10-CM

## 2024-10-07 DIAGNOSIS — E781 Pure hyperglyceridemia: Secondary | ICD-10-CM | POA: Diagnosis present

## 2024-10-07 DIAGNOSIS — Z85820 Personal history of malignant melanoma of skin: Secondary | ICD-10-CM

## 2024-10-07 DIAGNOSIS — J189 Pneumonia, unspecified organism: Secondary | ICD-10-CM | POA: Diagnosis present

## 2024-10-07 DIAGNOSIS — D849 Immunodeficiency, unspecified: Secondary | ICD-10-CM | POA: Diagnosis present

## 2024-10-07 DIAGNOSIS — J470 Bronchiectasis with acute lower respiratory infection: Secondary | ICD-10-CM | POA: Diagnosis present

## 2024-10-07 LAB — CBC WITH DIFFERENTIAL/PLATELET
Abs Immature Granulocytes: 0.1 K/uL — ABNORMAL HIGH (ref 0.00–0.07)
Basophils Absolute: 0.1 K/uL (ref 0.0–0.1)
Basophils Relative: 0 %
Eosinophils Absolute: 0.3 K/uL (ref 0.0–0.5)
Eosinophils Relative: 1 %
HCT: 41.9 % (ref 39.0–52.0)
Hemoglobin: 13 g/dL (ref 13.0–17.0)
Immature Granulocytes: 1 %
Lymphocytes Relative: 10 %
Lymphs Abs: 1.9 K/uL (ref 0.7–4.0)
MCH: 27.1 pg (ref 26.0–34.0)
MCHC: 31 g/dL (ref 30.0–36.0)
MCV: 87.5 fL (ref 80.0–100.0)
Monocytes Absolute: 1 K/uL (ref 0.1–1.0)
Monocytes Relative: 5 %
Neutro Abs: 15.6 K/uL — ABNORMAL HIGH (ref 1.7–7.7)
Neutrophils Relative %: 83 %
Platelets: 411 K/uL — ABNORMAL HIGH (ref 150–400)
RBC: 4.79 MIL/uL (ref 4.22–5.81)
RDW: 16.9 % — ABNORMAL HIGH (ref 11.5–15.5)
WBC: 19 K/uL — ABNORMAL HIGH (ref 4.0–10.5)
nRBC: 0 % (ref 0.0–0.2)

## 2024-10-07 LAB — COMPREHENSIVE METABOLIC PANEL WITH GFR
ALT: 14 U/L (ref 0–44)
AST: 18 U/L (ref 15–41)
Albumin: 3.7 g/dL (ref 3.5–5.0)
Alkaline Phosphatase: 103 U/L (ref 38–126)
Anion gap: 12 (ref 5–15)
BUN: 20 mg/dL (ref 8–23)
CO2: 22 mmol/L (ref 22–32)
Calcium: 10.9 mg/dL — ABNORMAL HIGH (ref 8.9–10.3)
Chloride: 103 mmol/L (ref 98–111)
Creatinine, Ser: 1.26 mg/dL — ABNORMAL HIGH (ref 0.61–1.24)
GFR, Estimated: >60 mL/min
Glucose, Bld: 116 mg/dL — ABNORMAL HIGH (ref 70–99)
Potassium: 4.7 mmol/L (ref 3.5–5.1)
Sodium: 137 mmol/L (ref 135–145)
Total Bilirubin: 0.5 mg/dL (ref 0.0–1.2)
Total Protein: 7.3 g/dL (ref 6.5–8.1)

## 2024-10-07 LAB — RESP PANEL BY RT-PCR (RSV, FLU A&B, COVID)  RVPGX2
Influenza A by PCR: NEGATIVE
Influenza B by PCR: NEGATIVE
Resp Syncytial Virus by PCR: NEGATIVE
SARS Coronavirus 2 by RT PCR: NEGATIVE

## 2024-10-07 MED ORDER — IPRATROPIUM-ALBUTEROL 0.5-2.5 (3) MG/3ML IN SOLN
3.0000 mL | Freq: Once | RESPIRATORY_TRACT | Status: DC
  Filled 2024-10-07: qty 3

## 2024-10-07 NOTE — ED Provider Triage Note (Signed)
 Emergency Medicine Provider Triage Evaluation Note  Todd Valdez , a 63 y.o. male  was evaluated in triage.  Pt complains of shortness of breath, primarily dry cough with intermittent mucus produced.  Previous admission for pneumonia, discharged 22 November for same.  History of RA, on methotrexate  and Orencia  for same.  Was felt at this admission that the recurrent pneumonia secondary to immunosuppression related to his RA.  Review of Systems  Positive: As above Negative:   Physical Exam  BP 117/73 (BP Location: Left Arm)   Pulse (!) 107   Temp 98 F (36.7 C) (Oral)   Resp 18   SpO2 93%  Gen:   Awake, no distress   Resp:  Normal effort  MSK:   Moves extremities without difficulty  Other:  Wheeze noted throughout all lung fields.  Medical Decision Making  Medically screening exam initiated at 7:36 PM.  Appropriate orders placed.  Todd Valdez was informed that the remainder of the evaluation will be completed by another provider, this initial triage assessment does not replace that evaluation, and the importance of remaining in the ED until their evaluation is complete.  Initial shortness of breath orders placed.   Myriam Dorn BROCK[place], [place] 10/07/24 [phone]

## 2024-10-07 NOTE — ED Triage Notes (Signed)
 PT. Arrives POV c/o cough and SOB. Pt. Recently dx with pneumonia. States that he's been off  of them for a month, but feels like the pneumonia has not gone away. C/o chest soreness and 1 episode of vomiting today.

## 2024-10-08 ENCOUNTER — Emergency Department (HOSPITAL_COMMUNITY)

## 2024-10-08 DIAGNOSIS — Z85820 Personal history of malignant melanoma of skin: Secondary | ICD-10-CM | POA: Diagnosis not present

## 2024-10-08 DIAGNOSIS — Z8249 Family history of ischemic heart disease and other diseases of the circulatory system: Secondary | ICD-10-CM | POA: Diagnosis not present

## 2024-10-08 DIAGNOSIS — I10 Essential (primary) hypertension: Secondary | ICD-10-CM | POA: Diagnosis present

## 2024-10-08 DIAGNOSIS — J470 Bronchiectasis with acute lower respiratory infection: Secondary | ICD-10-CM | POA: Diagnosis present

## 2024-10-08 DIAGNOSIS — E781 Pure hyperglyceridemia: Secondary | ICD-10-CM | POA: Diagnosis present

## 2024-10-08 DIAGNOSIS — Z79899 Other long term (current) drug therapy: Secondary | ICD-10-CM | POA: Diagnosis not present

## 2024-10-08 DIAGNOSIS — Z1152 Encounter for screening for COVID-19: Secondary | ICD-10-CM | POA: Diagnosis not present

## 2024-10-08 DIAGNOSIS — J852 Abscess of lung without pneumonia: Secondary | ICD-10-CM

## 2024-10-08 DIAGNOSIS — G4730 Sleep apnea, unspecified: Secondary | ICD-10-CM | POA: Diagnosis present

## 2024-10-08 DIAGNOSIS — J851 Abscess of lung with pneumonia: Secondary | ICD-10-CM | POA: Diagnosis present

## 2024-10-08 DIAGNOSIS — Z7951 Long term (current) use of inhaled steroids: Secondary | ICD-10-CM | POA: Diagnosis not present

## 2024-10-08 DIAGNOSIS — Z79631 Long term (current) use of antimetabolite agent: Secondary | ICD-10-CM | POA: Diagnosis not present

## 2024-10-08 DIAGNOSIS — D849 Immunodeficiency, unspecified: Secondary | ICD-10-CM | POA: Diagnosis present

## 2024-10-08 DIAGNOSIS — Z8701 Personal history of pneumonia (recurrent): Secondary | ICD-10-CM | POA: Diagnosis not present

## 2024-10-08 DIAGNOSIS — M069 Rheumatoid arthritis, unspecified: Secondary | ICD-10-CM | POA: Diagnosis present

## 2024-10-08 DIAGNOSIS — J189 Pneumonia, unspecified organism: Secondary | ICD-10-CM | POA: Diagnosis present

## 2024-10-08 DIAGNOSIS — Z87891 Personal history of nicotine dependence: Secondary | ICD-10-CM | POA: Diagnosis not present

## 2024-10-08 DIAGNOSIS — K219 Gastro-esophageal reflux disease without esophagitis: Secondary | ICD-10-CM | POA: Diagnosis present

## 2024-10-08 DIAGNOSIS — J479 Bronchiectasis, uncomplicated: Secondary | ICD-10-CM | POA: Diagnosis not present

## 2024-10-08 LAB — MRSA NEXT GEN BY PCR, NASAL: MRSA by PCR Next Gen: NOT DETECTED

## 2024-10-08 LAB — I-STAT CG4 LACTIC ACID, ED: Lactic Acid, Venous: 1.4 mmol/L (ref 0.5–1.9)

## 2024-10-08 MED ORDER — LACTATED RINGERS IV BOLUS (SEPSIS): 1000.0000 mL | Freq: Once | INTRAVENOUS | Status: DC

## 2024-10-08 MED ORDER — ACETAMINOPHEN 325 MG PO TABS
650.0000 mg | ORAL_TABLET | Freq: Four times a day (QID) | ORAL | Status: DC | PRN
  Administered 2024-10-08 – 2024-10-09 (×3): 650 mg via ORAL
  Filled 2024-10-08 (×3): qty 2

## 2024-10-08 MED ORDER — CYCLOBENZAPRINE HCL 10 MG PO TABS: 10.0000 mg | ORAL_TABLET | Freq: Three times a day (TID) | ORAL | Status: DC | PRN

## 2024-10-08 MED ORDER — LACTATED RINGERS IV BOLUS (SEPSIS)
1000.0000 mL | Freq: Once | INTRAVENOUS | Status: AC
  Administered 2024-10-08: 1000 mL via INTRAVENOUS

## 2024-10-08 MED ORDER — TRAZODONE HCL 50 MG PO TABS: 25.0000 mg | ORAL_TABLET | Freq: Every evening | ORAL | Status: DC | PRN

## 2024-10-08 MED ORDER — LACTATED RINGERS IV SOLN: INTRAVENOUS | Status: DC

## 2024-10-08 MED ORDER — PREDNISONE 5 MG PO TABS
5.0000 mg | ORAL_TABLET | Freq: Every day | ORAL | Status: DC
  Administered 2024-10-09 – 2024-10-10 (×2): 5 mg via ORAL
  Filled 2024-10-08 (×2): qty 1

## 2024-10-08 MED ORDER — ATORVASTATIN CALCIUM 40 MG PO TABS
40.0000 mg | ORAL_TABLET | Freq: Every day | ORAL | Status: DC
  Administered 2024-10-08 – 2024-10-09 (×2): 40 mg via ORAL
  Filled 2024-10-08 (×2): qty 1

## 2024-10-08 MED ORDER — HYDROCODONE BIT-HOMATROP MBR 5-1.5 MG/5ML PO SOLN
5.0000 mL | Freq: Four times a day (QID) | ORAL | Status: DC | PRN
  Administered 2024-10-08 – 2024-10-10 (×5): 5 mL via ORAL
  Filled 2024-10-08 (×5): qty 5

## 2024-10-08 MED ORDER — ACETAMINOPHEN 500 MG PO TABS
1000.0000 mg | ORAL_TABLET | Freq: Once | ORAL | Status: DC
  Filled 2024-10-08: qty 2

## 2024-10-08 MED ORDER — ALBUTEROL SULFATE (2.5 MG/3ML) 0.083% IN NEBU: 2.5000 mg | INHALATION_SOLUTION | RESPIRATORY_TRACT | Status: DC | PRN

## 2024-10-08 MED ORDER — SODIUM CHLORIDE 0.9 % IV SOLN: 3.0000 g | Freq: Once | INTRAVENOUS | Status: DC

## 2024-10-08 MED ORDER — ACETAMINOPHEN 650 MG RE SUPP: 650.0000 mg | Freq: Four times a day (QID) | RECTAL | Status: DC | PRN

## 2024-10-08 MED ORDER — FOLIC ACID 1 MG PO TABS
1.0000 mg | ORAL_TABLET | Freq: Every day | ORAL | Status: DC
  Administered 2024-10-08 – 2024-10-10 (×3): 1 mg via ORAL
  Filled 2024-10-08 (×3): qty 1

## 2024-10-08 MED ORDER — PIPERACILLIN-TAZOBACTAM 3.375 G IVPB 30 MIN
3.3750 g | Freq: Once | INTRAVENOUS | Status: AC
  Administered 2024-10-08: 3.375 g via INTRAVENOUS
  Filled 2024-10-08: qty 50

## 2024-10-08 MED ORDER — FENOFIBRATE 160 MG PO TABS
160.0000 mg | ORAL_TABLET | Freq: Every day | ORAL | Status: DC
  Administered 2024-10-09 – 2024-10-10 (×2): 160 mg via ORAL
  Filled 2024-10-08 (×2): qty 1

## 2024-10-08 MED ORDER — PIPERACILLIN-TAZOBACTAM 3.375 G IVPB
3.3750 g | Freq: Three times a day (TID) | INTRAVENOUS | Status: DC
  Administered 2024-10-08 – 2024-10-10 (×6): 3.375 g via INTRAVENOUS
  Filled 2024-10-08 (×5): qty 50

## 2024-10-08 MED ORDER — CLINDAMYCIN PHOSPHATE 600 MG/50ML IV SOLN: 600.0000 mg | Freq: Once | INTRAVENOUS | Status: DC

## 2024-10-08 MED ORDER — ONDANSETRON HCL 4 MG/2ML IJ SOLN: 4.0000 mg | Freq: Four times a day (QID) | INTRAMUSCULAR | Status: DC | PRN

## 2024-10-08 MED ORDER — IOHEXOL 300 MG/ML  SOLN
75.0000 mL | Freq: Once | INTRAMUSCULAR | Status: AC | PRN
  Administered 2024-10-08: 75 mL via INTRAVENOUS

## 2024-10-08 MED ORDER — ONDANSETRON HCL 4 MG PO TABS: 4.0000 mg | ORAL_TABLET | Freq: Four times a day (QID) | ORAL | Status: DC | PRN

## 2024-10-08 MED ORDER — BUDESON-GLYCOPYRROL-FORMOTEROL 160-9-4.8 MCG/ACT IN AERO
2.0000 | INHALATION_SPRAY | Freq: Two times a day (BID) | RESPIRATORY_TRACT | Status: DC
  Administered 2024-10-08 – 2024-10-10 (×3): 2 via RESPIRATORY_TRACT
  Filled 2024-10-08: qty 5.9

## 2024-10-08 NOTE — Consult Note (Signed)
 "  NAME:  Todd Valdez, MRN:  [id number], DOB:  January 19, 1961, LOS: 0 ADMISSION DATE:  10/07/2024, CONSULTATION DATE:  10/08/24 REFERRING MD:  EDP CHIEF COMPLAINT:  Lung Abscess   History of Present Illness:  Todd Valdez is a 63 year old male with bronchiectasis and rheumatoid arthritis on methotrexate  and orencia  who presents to the ER today with on going cough, dyspnea and generalized weakness. He reports low grade fevers and no energy.  He was recently hospitalized 10/13 to 10/15 and 11/18 to 11/22 for sepsis due to pneumonia.   CT Chest scan 07/21/24 showed dense right lower lobe consolidation. He was treated with ceftriaxone  and doxycycline  at that time and discharged on course of doxycycline . No sputum or respiratory culture obtained.   CT Chest 07/29/24 ordered by the primary care team showed moderate sized right pleural effusion and decreased patchy and consolidative right lower lobe air space opacity.   During admission 11/8-11/22 he was treated with ceftriaxone  and azithromycin . Discharged on 14 day course of levaquin .   He was seen 11/25 in pulmonary clinic and reported feeling better at that time and was still on the course of levaquin  at that time.   Repeat CT Chest scan 12/22 showed mass like consolidation concerning for lung abscess and CT Chest with contrast was recommended for further evaluation. He was notified of these results on 12/23 by the pulmonary clinic. He reported feeling fine but had continued cough.    Pertinent  Medical History   Past Medical History:  Diagnosis Date   Arthritis    Cancer (HCC)    Melanoma on  back   Diverticulitis    GERD (gastroesophageal reflux disease)    High triglycerides    History of abscess of skin and subcutaneous tissue    right forearm, chin   Hypertension    RA (rheumatoid arthritis) (HCC)    Reflux esophagitis    Right inguinal hernia    Sleep apnea    no cpap     Significant Hospital Events: Including procedures,  antibiotic start and stop dates in addition to other pertinent events   12/31 admitted for lung abscess, started on zosyn  Interim History / Subjective:  As above  Objective    Blood pressure 114/70, pulse (!) 102, temperature 98.7 F (37.1 C), resp. rate 20, SpO2 93%.       No intake or output data in the 24 hours ending 10/08/24 1457 There were no vitals filed for this visit.  Examination: General: no acute distress, sitting up in chair HENT: [place]/AT, moist mucous membranes Lungs: Clear on left, crackles right mid lung, no wheezing Cardiovascular: tachycardic, no murmur Abdomen: soft, non-tender, non-distended Extremities: warm, trace edema Neuro: alert, moving all extremities GU: n/a  Resolved problem list   Assessment and Plan   Bronchiectasis without exacerbation Rheumatoid arthritis on methotrexate  and orencia  Right Lower lobe lung abscess  - check ESR/CRP - follow up blood cultures - Continue Zosyn antibiotic coverage - Check MRSA screen, if positive will add MRSA coverage - Plan for bronchoscopy as he is unable to provide sputum sample to obtain BAL for cultures - NPO after midnight tonight - If cultures are unrevealing will likely treat with long term augmentin  until abscess is fully resolved on imaging - Will need to hold methotrexate  and orencia  for a period of time as well but will need to balance his RA symptoms as he did note return of significant joint pain once off medications for a couple of weeks.  PCCM will continue to follow   Labs   CBC: Recent Labs  Lab 10/07/24 1943  WBC 19.0*  NEUTROABS 15.6*  HGB 13.0  HCT 41.9  MCV 87.5  PLT 411*    Basic Metabolic Panel: Recent Labs  Lab 10/07/24 1943  NA 137  K 4.7  CL 103  CO2 22  GLUCOSE 116*  BUN 20  CREATININE 1.26*  CALCIUM  10.9*   GFR: CrCl cannot be calculated (Unknown ideal weight.). Recent Labs  Lab 10/07/24 1943 10/08/24 1256  WBC 19.0*  --   LATICACIDVEN  --  1.4     Liver Function Tests: Recent Labs  Lab 10/07/24 1943  AST 18  ALT 14  ALKPHOS 103  BILITOT 0.5  PROT 7.3  ALBUMIN 3.7   No results for input(s): LIPASE, AMYLASE in the last 168 hours. No results for input(s): AMMONIA in the last 168 hours.  ABG    Component Value Date/Time   HCO3 21.1 07/21/2024 0610   TCO2 22 07/21/2024 0610   ACIDBASEDEF 2.0 07/21/2024 0610   O2SAT 99 07/21/2024 0610     Coagulation Profile: No results for input(s): INR, PROTIME in the last 168 hours.  Cardiac Enzymes: No results for input(s): CKTOTAL, CKMB, CKMBINDEX, TROPONINI in the last 168 hours.  HbA1C: Hgb A1c MFr Bld  Date/Time Value Ref Range Status  10/22/2023 09:12 AM 5.7 (H) 4.8 - 5.6 % Final    Comment:             Prediabetes: 5.7 - 6.4          Diabetes: >6.4          Glycemic control for adults with diabetes: <7.0   11/08/2022 08:09 AM 5.8 (H) <5.7 % of total Hgb Final    Comment:    For someone without known diabetes, a hemoglobin  A1c value between 5.7% and 6.4% is consistent with prediabetes and should be confirmed with a  follow-up test. . For someone with known diabetes, a value <7% indicates that their diabetes is well controlled. A1c targets should be individualized based on duration of diabetes, age, comorbid conditions, and other considerations. . This assay result is consistent with an increased risk of diabetes. . Currently, no consensus exists regarding use of hemoglobin A1c for diagnosis of diabetes for children. .     CBG: No results for input(s): GLUCAP in the last 168 hours.  Review of Systems:   Review of Systems  Constitutional:  Positive for chills, fever and malaise/fatigue. Negative for weight loss.  HENT:  Negative for congestion, sinus pain and sore throat.   Eyes: Negative.   Respiratory:  Positive for cough. Negative for hemoptysis, sputum production, shortness of breath and wheezing.   Cardiovascular:  Negative  for chest pain, palpitations, orthopnea, claudication and leg swelling.  Gastrointestinal:  Negative for abdominal pain, heartburn, nausea and vomiting.  Genitourinary: Negative.   Musculoskeletal:  Negative for joint pain and myalgias.  Skin:  Negative for rash.  Neurological:  Negative for weakness.  Endo/Heme/Allergies: Negative.   Psychiatric/Behavioral: Negative.       Past Medical History:  He,  has a past medical history of Arthritis, Cancer (HCC), Diverticulitis, GERD (gastroesophageal reflux disease), High triglycerides, History of abscess of skin and subcutaneous tissue, Hypertension, RA (rheumatoid arthritis) (HCC), Reflux esophagitis, Right inguinal hernia, and Sleep apnea.   Surgical History:   Past Surgical History:  Procedure Laterality Date   bilateral feet reconstruction  10/2000   COLONOSCOPY  CYSTOSCOPY W/ URETERAL STENT PLACEMENT Left 11/01/2023   Procedure: CYSTOSCOPY WITH RETROGRADE PYELOGRAM/URETERAL STENT PLACEMENT;  Surgeon: Nieves Cough, MD;  Location: [facility];  Service: Urology;  Laterality: Left;   feet surgery     INGUINAL HERNIA REPAIR Right 11/27/2017   Procedure: OPEN RIGHT INGUINAL HERNIA REPAIR;  Surgeon: Eletha Boas, MD;  Location: [facility] ;  Service: General;  Laterality: Right;   INSERTION OF MESH Right 11/27/2017   Procedure: INSERTION OF MESH;  Surgeon: Eletha Boas, MD;  Location: [facility] ;  Service: General;  Laterality: Right;   MELANOMA EXCISION     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 His smoking use included cigarettes. He quit smokeless tobacco use about 15 years ago. He reports that he does not drink alcohol and  does not use drugs.   Family History:  His family history includes Hypertension in his father and mother.   Allergies Allergies[1]   Home Medications  Prior to Admission medications  Medication Sig Start Date End Date Taking? Authorizing Provider  [Paused] Abatacept  (ORENCIA  CLICKJECT) 125 MG/ML SOAJ Inject 125mg  Subcutaneous once a week Patient taking differently: Inject 125 mg into the skin every Friday. Wait to take this until your doctor or other care provider tells you to start again. 07/22/24     acetaminophen  (TYLENOL ) 325 MG tablet Take 325-650 mg by mouth daily as needed for mild pain (pain score 1-3) or moderate pain (pain score 4-6).    [provider]  albuterol  (VENTOLIN  HFA) 108 (90 Base) MCG/ACT inhaler Inhale 2 puffs into the lungs every 6 (six) hours as needed for wheezing or shortness of breath. 05/07/24   Kara Dorn NOVAK, MD  atorvastatin  (LIPITOR) 40 MG tablet Take 40 mg by mouth daily.    [provider]  atorvastatin  (LIPITOR) 40 MG tablet Take 1 tablet (40 mg total) by mouth daily at 6 PM. 09/30/24 09/30/25  Alvia Bring, DO  cyclobenzaprine  (FLEXERIL ) 10 MG tablet Take 1 tablet (10 mg total) by mouth 3 (three) times daily as needed for muscle spasms. 07/28/24   Alvia Bring, DO  [Paused] doxycycline  (ADOXA) 50 MG tablet Take 50 mg by mouth daily with breakfast. Wait to take this until your doctor or other care provider tells you to start again.    [provider]  doxycycline  (ADOXA) 50 MG tablet Take 1 tablet (50 mg total) by mouth daily. 09/08/24     esomeprazole  (NEXIUM ) 40 MG capsule Take 1 capsule (40 mg total) by mouth daily at 12 noon. Patient taking differently: Take 40 mg by mouth daily before breakfast. 06/18/24 06/18/25  Crain, Benton L, PA  Evolocumab  (REPATHA  SURECLICK) 140 MG/ML SOAJ Inject 140 mg into the skin every 14 (fourteen) days. 06/25/24   Alvia Bring, DO  fenofibrate  160 MG tablet Take 160 mg by mouth daily.     [provider]  fenofibrate  160 MG tablet Take 1 tablet (160 mg total) by mouth daily. 09/18/24   Alvia Bring, DO  Fluticasone -Umeclidin-Vilant (TRELEGY ELLIPTA ) 200-62.5-25 MCG/ACT AEPB Inhale 1 puff by mouth into the lungs daily. 09/30/24   Alvia Bring, DO  folic acid  (FOLVITE ) 1 MG tablet Take 1 tablet (1 mg total) by mouth daily.  03/31/24     guaiFENesin  (MUCINEX ) 600 MG 12 hr tablet Take 600 mg by mouth 2 (two) times daily as needed for to loosen phlegm or cough.    [provider]  HYDROcodone  bit-homatropine (HYDROMET) 5-1.5 MG/5ML syrup Take 5 mLs by mouth every 6 (six) hours as needed for cough. 08/12/24   Alvia Bring, DO  [Paused] lisinopril -hydrochlorothiazide  (ZESTORETIC ) 20-25 MG tablet Take 1 tablet by mouth daily. Wait to take this until your doctor or other care provider tells you to start again. 08/12/24 08/12/25  Alvia Bring, DO  [Paused] methotrexate  250 MG/10ML injection Inject 1 mL (25 mg total) into the muscle once a week. Wait to take this until your doctor or other care provider tells you to start again. 12/10/23     niacin  (,VITAMIN B3,) 500 MG tablet Take 1 tablet (500 mg total) by mouth at bedtime. 08/21/23   Curtis Debby PARAS, MD  omega-3 acid ethyl esters (LOVAZA ) 1 g capsule Take 1 g by mouth 2 (two) times daily.    [provider]  ondansetron  (ZOFRAN -ODT) 8 MG disintegrating tablet Take 1 tablet (8 mg total) by mouth every 8 (eight) hours as needed for nausea. Patient taking differently: Take 8 mg by mouth every 8 (eight) hours as needed for nausea (dissolve orally). 12/04/23   Curtis Debby PARAS, MD  predniSONE  (DELTASONE ) 5 MG tablet Take 5 mg by mouth daily with breakfast.    [provider]  sodium chloride  HYPERTONIC 3 % nebulizer solution Take by nebulization 2 (two) times daily as needed. Patient taking differently: Take 4 mLs by nebulization 2 (two) times daily as needed for cough. 05/07/24   Kara Dorn NOVAK,  MD  TRELEGY ELLIPTA  200-62.5-25 MCG/ACT AEPB Inhale 1 puff into the lungs in the morning.    [provider]     Critical care time: n/a    Dorn Kara, MD [facility] Office: [phone]   See Amion for personal pager PCCM on call pager [phone] until 7pm. Please call Elink 7p-7a. [phone]            [1] No Known Allergies  "

## 2024-10-08 NOTE — Progress Notes (Signed)
" °   10/08/24 2311  BiPAP/CPAP/SIPAP  Reason BIPAP/CPAP not in use Non-compliant (not in room)    "

## 2024-10-08 NOTE — Progress Notes (Signed)
 Elink following for sepsis protocol.

## 2024-10-08 NOTE — ED Provider Notes (Signed)
 " [facility] Valdez EMERGENCY DEPARTMENT AT [facility] Provider Note   CSN: [id number] Arrival date & time: 10/07/24  1739     Patient presents with: No chief complaint on file.   Todd Valdez is a 63 y.o. male.   HPI   63 year old male presents emergency department with concern for ongoing shortness of breath, productive cough.  Recently was diagnosed and admitted with pneumonia.  Was discharged, completed oral antibiotics but returns with worsening symptoms.  Concern for worsening infection.  Patient admits to chest soreness, decreased appetite, episode of nausea/vomiting and chills.  Prior to Admission medications  Medication Sig Start Date End Date Taking? Authorizing Provider  [Paused] Abatacept  (ORENCIA  CLICKJECT) 125 MG/ML SOAJ Inject 125mg  Subcutaneous once a week Patient taking differently: Inject 125 mg into the skin every Friday. Wait to take this until your doctor or other care provider tells you to start again. 07/22/24     acetaminophen  (TYLENOL ) 325 MG tablet Take 325-650 mg by mouth daily as needed for mild pain (pain score 1-3) or moderate pain (pain score 4-6).    [provider]  albuterol  (VENTOLIN  HFA) 108 (90 Base) MCG/ACT inhaler Inhale 2 puffs into the lungs every 6 (six) hours as needed for wheezing or shortness of breath. 05/07/24   Kara Dorn NOVAK, MD  atorvastatin  (LIPITOR) 40 MG tablet Take 40 mg by mouth daily.    [provider]  atorvastatin  (LIPITOR) 40 MG tablet Take 1 tablet (40 mg total) by mouth daily at 6 PM. 09/30/24 09/30/25  Alvia Bring, DO  cyclobenzaprine  (FLEXERIL ) 10 MG tablet Take 1 tablet (10 mg total) by mouth 3 (three) times daily as needed for muscle spasms. 07/28/24   Alvia Bring, DO  [Paused] doxycycline  (ADOXA) 50 MG tablet Take 50 mg by mouth daily with breakfast. Wait to take this until your doctor or other care provider tells you to start again.    [provider]  doxycycline  (ADOXA) 50 MG  tablet Take 1 tablet (50 mg total) by mouth daily. 09/08/24     esomeprazole  (NEXIUM ) 40 MG capsule Take 1 capsule (40 mg total) by mouth daily at 12 noon. Patient taking differently: Take 40 mg by mouth daily before breakfast. 06/18/24 06/18/25  Crain, Whitney L, PA  Evolocumab  (REPATHA  SURECLICK) 140 MG/ML SOAJ Inject 140 mg into the skin every 14 (fourteen) days. 06/25/24   Alvia Bring, DO  fenofibrate  160 MG tablet Take 160 mg by mouth daily.    [provider]  fenofibrate  160 MG tablet Take 1 tablet (160 mg total) by mouth daily. 09/18/24   Alvia Bring, DO  Fluticasone -Umeclidin-Vilant (TRELEGY ELLIPTA ) 200-62.5-25 MCG/ACT AEPB Inhale 1 puff by mouth into the lungs daily. 09/30/24   Alvia Bring, DO  folic acid  (FOLVITE ) 1 MG tablet Take 1 tablet (1 mg total) by mouth daily. 03/31/24     guaiFENesin  (MUCINEX ) 600 MG 12 hr tablet Take 600 mg by mouth 2 (two) times daily as needed for to loosen phlegm or cough.    [provider]  HYDROcodone  bit-homatropine (HYDROMET) 5-1.5 MG/5ML syrup Take 5 mLs by mouth every 6 (six) hours as needed for cough. 08/12/24   Alvia Bring, DO  [Paused] lisinopril -hydrochlorothiazide  (ZESTORETIC ) 20-25 MG tablet Take 1 tablet by mouth daily. Wait to take this until your doctor or other care provider tells you to start again. 08/12/24 08/12/25  Alvia Bring, DO  [Paused] methotrexate  250 MG/10ML injection Inject 1 mL (25 mg total) into the muscle once a  week. Wait to take this until your doctor or other care provider tells you to start again. 12/10/23     niacin  (,VITAMIN B3,) 500 MG tablet Take 1 tablet (500 mg total) by mouth at bedtime. 08/21/23   Curtis Debby PARAS, MD  omega-3 acid ethyl esters (LOVAZA ) 1 g capsule Take 1 g by mouth 2 (two) times daily.    [provider]  ondansetron  (ZOFRAN -ODT) 8 MG disintegrating tablet Take 1 tablet (8 mg total) by mouth every 8 (eight) hours as needed for nausea. Patient taking  differently: Take 8 mg by mouth every 8 (eight) hours as needed for nausea (dissolve orally). 12/04/23   Curtis Debby PARAS, MD  predniSONE  (DELTASONE ) 5 MG tablet Take 5 mg by mouth daily with breakfast.    [provider]  sodium chloride  HYPERTONIC 3 % nebulizer solution Take by nebulization 2 (two) times daily as needed. Patient taking differently: Take 4 mLs by nebulization 2 (two) times daily as needed for cough. 05/07/24   Kara Dorn NOVAK, MD  TRELEGY ELLIPTA  200-62.5-25 MCG/ACT AEPB Inhale 1 puff into the lungs in the morning.    [provider]    Allergies: Patient has no known allergies.    Review of Systems  Constitutional:  Positive for appetite change, chills and fatigue. Negative for fever.  Respiratory:  Positive for cough and shortness of breath.   Cardiovascular:  Positive for chest pain. Negative for leg swelling.  Gastrointestinal:  Negative for abdominal pain, diarrhea and vomiting.  Skin:  Negative for rash.  Neurological:  Negative for headaches.    Updated Vital Signs BP 114/70   Pulse (!) 102   Temp 98.7 F (37.1 C)   Resp 20   SpO2 93%   Physical Exam Vitals and nursing note reviewed.  Constitutional:      General: He is not in acute distress.    Appearance: Normal appearance. He is ill-appearing.  HENT:     Head: Normocephalic.     Mouth/Throat:     Mouth: Mucous membranes are moist.  Cardiovascular:     Rate and Rhythm: Tachycardia present.  Pulmonary:     Effort: No respiratory distress.     Comments: Conversationally tachypneic Abdominal:     Palpations: Abdomen is soft.     Tenderness: There is no abdominal tenderness.  Skin:    General: Skin is warm.  Neurological:     Mental Status: He is alert and oriented to person, place, and time. Mental status is at baseline.  Psychiatric:        Mood and Affect: Mood normal.     (all labs ordered are listed, but only abnormal results are displayed) Labs Reviewed  CBC  WITH DIFFERENTIAL/PLATELET - Abnormal; Notable for the following components:      Result Value   WBC 19.0 (*)    RDW 16.9 (*)    Platelets 411 (*)    Neutro Abs 15.6 (*)    Abs Immature Granulocytes 0.10 (*)    All other components within normal limits  COMPREHENSIVE METABOLIC PANEL WITH GFR - Abnormal; Notable for the following components:   Glucose, Bld 116 (*)    Creatinine, Ser 1.26 (*)    Calcium  10.9 (*)    All other components within normal limits  RESP PANEL BY RT-PCR (RSV, FLU A&B, COVID)  RVPGX2  EXPECTORATED SPUTUM ASSESSMENT W GRAM STAIN, RFLX TO RESP C  CULTURE, BLOOD (ROUTINE X 2)  CULTURE, BLOOD (ROUTINE X 2)  I-STAT  CG4 LACTIC ACID, ED  I-STAT CG4 LACTIC ACID, ED    EKG: EKG Interpretation Date/Time:  Tuesday October 07 2024 19:40:26 EST Ventricular Rate:  108 PR Interval:  165 QRS Duration:  77 QT Interval:  295 QTC Calculation: 396 R Axis:   -17  Text Interpretation: Sinus tachycardia Low voltage, precordial leads Probable left ventricular hypertrophy Anterior Q waves, possibly due to LVH Confirmed by Bari Flank [phone]) on 10/08/2024 1:40:49 PM  Radiology: CT CHEST W CONTRAST Result Date: 10/08/2024 CLINICAL DATA:  evaluate lung mass vs abscess. EXAM: CT CHEST WITH CONTRAST TECHNIQUE: Multidetector CT imaging of the chest was performed during intravenous contrast administration. RADIATION DOSE REDUCTION: This exam was performed according to the departmental dose-optimization program which includes automated exposure control, adjustment of the mA and/or kV according to patient size and/or use of iterative reconstruction technique. CONTRAST:  75mL OMNIPAQUE  IOHEXOL  300 MG/ML  SOLN COMPARISON:  CT scan chest from 09/29/2024. FINDINGS: Cardiovascular: Normal cardiac size. No pericardial effusion. No aortic aneurysm. There are coronary artery calcifications, in keeping with coronary artery disease. There are also mild peripheral atherosclerotic vascular  calcifications of thoracic aorta and its major branches. Mediastinum/Nodes: Visualized thyroid  gland appears grossly unremarkable. No solid / cystic mediastinal masses. The esophagus is nondistended precluding optimal assessment. There are few mildly prominent mediastinal and hilar lymph nodes, which do not meet the size criteria for lymphadenopathy and appear grossly similar to the prior study, favoring benign/reactive etiology. No axillary lymphadenopathy by size criteria. Lungs/Pleura: The central tracheo-bronchial tree is patent. Redemonstration of well-circumscribed 5.0 x 6.2 x 6.3 cm low-attenuation area in the right lung lower lobe, posteriorly. There is surrounding slightly irregular, hyperattenuating wall as well as surrounding atelectatic changes and volume loss in the right lower lobe. There is associated trace right pleural effusion. Findings favor right lung lower lobe lung abscess. There also atelectatic changes in the middle lobe. Left lung and right lung upper lobe are unremarkable. No mass, lung collapse, pleural effusion or pneumothorax. There are scattered sub 3 mm calcified granulomas throughout bilateral lungs. No suspicious lung nodule. Upper Abdomen: There are several hypoattenuating structures in bilateral kidneys. There is a small-to-moderate sliding hiatal hernia. Remaining visualized upper abdominal viscera within normal limits. Musculoskeletal: The visualized soft tissues of the chest wall are grossly unremarkable. No suspicious osseous lesions. There are mild multilevel degenerative changes in the visualized spine. IMPRESSION: 1. Redemonstration of well-circumscribed 5.0 x 6.2 x 6.3 cm low-attenuation area in the right lung lower lobe, posteriorly with surrounding atelectatic changes and volume loss. There is associated trace right pleural effusion. Findings favor right lung lower lobe intrapulmonary abscess. 2. Multiple other nonacute observations, as described above. Aortic  Atherosclerosis (ICD10-I70.0). Electronically Signed   By: Ree Molt M.D.   On: 10/08/2024 13:51   DG Chest Port 1 View Result Date: 10/07/2024 EXAM: 1 VIEW(S) XRAY OF THE CHEST 10/07/2024 07:50:00 PM COMPARISON: 08/26/2024 CLINICAL HISTORY: Cough, shortness of breath FINDINGS: LUNGS AND PLEURA: Low lung volumes. Stable elevation of the right hemidiaphragm. Stable focal dense opacity in the right midlung. Left lung is clear. No pleural effusion. No pneumothorax. HEART AND MEDIASTINUM: No acute abnormality of the cardiac and mediastinal silhouettes. BONES AND SOFT TISSUES: No acute osseous abnormality. IMPRESSION: 1. Stable focal dense opacity in the right midlung. 2. Low lung volumes and stable elevation of the right hemidiaphragm. Electronically signed by: Greig Pique MD 10/07/2024 09:08 PM EST RP Workstation: [id number]     .Critical Care  Performed by: Bari Flank HERO,  DO Authorized by: Bari Roxie HERO, DO   Critical care provider statement:    Critical care time (minutes):  50   Critical care time was exclusive of:  Separately billable procedures and treating other patients   Critical care was necessary to treat or prevent imminent or life-threatening deterioration of the following conditions:  Sepsis   Critical care was time spent personally by me on the following activities:  Development of treatment plan with patient or surrogate, discussions with consultants, evaluation of patient's response to treatment, examination of patient, ordering and review of laboratory studies, ordering and review of radiographic studies, ordering and performing treatments and interventions, pulse oximetry, re-evaluation of patient's condition and review of old charts   I assumed direction of critical care for this patient from another provider in my specialty: no     Care discussed with: admitting provider      Medications Ordered in the ED  ipratropium-albuterol  (DUONEB) 0.5-2.5 (3) MG/3ML  nebulizer solution 3 mL (has no administration in time range)  acetaminophen  (TYLENOL ) tablet 1,000 mg (has no administration in time range)  clindamycin  (CLEOCIN ) IVPB 600 mg (has no administration in time range)  Ampicillin-Sulbactam (UNASYN) 3 g in sodium chloride  0.9 % 100 mL IVPB (has no administration in time range)  iohexol  (OMNIPAQUE ) 300 MG/ML solution 75 mL (75 mLs Intravenous Contrast Given 10/08/24 1155)                                    Medical Decision Making Risk Prescription drug management. Decision regarding hospitalization.   63 year old male presents emergency department with ongoing productive cough, chest discomfort, fatigue, chills.  Was admitted last month with pneumonia, completed medication with worsening symptoms.  Patient tachycardic on arrival, afebrile.  Ill-appearing but in no acute distress.  Blood work shows leukocytosis of 19, mild AKI, normal lactic.  Respiratory panel is negative.  Blood cultures have been sent.  Chest imaging is concerning for pulmonary abscess.  Sepsis orders, fluid and antibiotics placed.  Patient follows with Dr. Kara.  He is updated on the patient's status, anticipates a bronchoscopy, will review the imaging and give recommendations.  Patient will be admitted to the hospitalist service.  Patients evaluation and results requires admission for further treatment and care.  Spoke with hospitalist, reviewed patient's ED course and they accept admission.  Patient agrees with admission plan, offers no new complaints and is stable/unchanged at time of admit.     Final diagnoses:  Abscess of right lung with pneumonia, unspecified part of lung [facility])    ED Discharge Orders     None          Bari Roxie HERO, DO 10/08/24 1456  "

## 2024-10-08 NOTE — H&P (Signed)
 " History and Physical  Todd Valdez FMW:[id number] DOB: 1961/02/10 DOA: 10/07/2024  PCP: Alvia Bring, DO   Chief Complaint: Cough  HPI: Todd Valdez is a 63 y.o. male with medical history significant for RA, GERD, hypertriglyceridemia, hypertension, suspected underlying COPD and recent hospitalizations with right-sided pneumonia being admitted to the hospital with continued cough and discovery of pulmonary abscess.  This is his third hospitalization in recent months for ongoing pneumonia, he recently completed a 10-day course of Levaquin  750 mg p.o.  States that he got slightly better, however he has continued cough, dyspnea, generally feeling weak.  He has also been having low-grade fevers at home.  On evaluation here in the emergency department, he is stable on room air, and not septic.  However CT scan as noted below shows evidence of pulmonary abscess.  He was admitted to the hospitalist service, with pulmonary consult.  Review of Systems: Please see HPI for pertinent positives and negatives. A complete 10 system review of systems are otherwise negative.  Past Medical History:  Diagnosis Date   Arthritis    Cancer (HCC)    Melanoma on  back   Diverticulitis    GERD (gastroesophageal reflux disease)    High triglycerides    History of abscess of skin and subcutaneous tissue    right forearm, chin   Hypertension    RA (rheumatoid arthritis) (HCC)    Reflux esophagitis    Right inguinal hernia    Sleep apnea    no cpap   Past Surgical History:  Procedure Laterality Date   bilateral feet reconstruction  10/2000   COLONOSCOPY     CYSTOSCOPY W/ URETERAL STENT PLACEMENT Left 11/01/2023   Procedure: CYSTOSCOPY WITH RETROGRADE PYELOGRAM/URETERAL STENT PLACEMENT;  Surgeon: Nieves Cough, MD;  Location: [facility];  Service: Urology;  Laterality: Left;   feet surgery     INGUINAL HERNIA REPAIR Right 11/27/2017   Procedure: OPEN RIGHT INGUINAL HERNIA REPAIR;  Surgeon: Eletha Boas, MD;   Location: [facility];  Service: General;  Laterality: Right;   INSERTION OF MESH Right 11/27/2017   Procedure: INSERTION OF MESH;  Surgeon: Eletha Boas, MD;  Location: [facility] [place];  Service: General;  Laterality: Right;   MELANOMA EXCISION     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 His smoking use included cigarettes. He quit smokeless tobacco use about 15 years ago. He reports that he does not drink alcohol and does not use drugs.  Allergies[1]  Family History  Problem Relation Age of Onset   Hypertension Mother    Hypertension Father      Prior to Admission medications  Medication Sig Start Date End Date Taking? Authorizing Provider  [Paused] Abatacept  (ORENCIA  CLICKJECT) 125 MG/ML SOAJ Inject 125mg  Subcutaneous once a week Patient taking differently: Inject 125 mg into the skin every Friday. Wait to take this until your doctor or other care provider tells you to start again. 07/22/24     acetaminophen  (TYLENOL ) 325 MG tablet Take 325-650 mg by mouth daily as needed for mild pain (pain score 1-3) or moderate pain (pain score 4-6).    [provider]  albuterol  (VENTOLIN  HFA) 108 (90 Base) MCG/ACT inhaler Inhale 2 puffs into the lungs every 6 (  six) hours as needed for wheezing or shortness of breath. 05/07/24   Kara Dorn NOVAK, MD  atorvastatin  (LIPITOR) 40 MG tablet Take 40 mg by mouth daily.    [provider]  atorvastatin  (LIPITOR) 40 MG tablet Take 1 tablet (40 mg total) by mouth daily at 6 PM. 09/30/24 09/30/25  Alvia Bring, DO  cyclobenzaprine  (FLEXERIL ) 10 MG tablet Take 1 tablet (10 mg total) by mouth 3 (three) times daily as needed for muscle  spasms. 07/28/24   Alvia Bring, DO  [Paused] doxycycline  (ADOXA) 50 MG tablet Take 50 mg by mouth daily with breakfast. Wait to take this until your doctor or other care provider tells you to start again.    [provider]  doxycycline  (ADOXA) 50 MG tablet Take 1 tablet (50 mg total) by mouth daily. 09/08/24     esomeprazole  (NEXIUM ) 40 MG capsule Take 1 capsule (40 mg total) by mouth daily at 12 noon. Patient taking differently: Take 40 mg by mouth daily before breakfast. 06/18/24 06/18/25  Crain, Benton L, PA  Evolocumab  (REPATHA  SURECLICK) 140 MG/ML SOAJ Inject 140 mg into the skin every 14 (fourteen) days. 06/25/24   Alvia Bring, DO  fenofibrate  160 MG tablet Take 160 mg by mouth daily.    [provider]  fenofibrate  160 MG tablet Take 1 tablet (160 mg total) by mouth daily. 09/18/24   Alvia Bring, DO  Fluticasone -Umeclidin-Vilant (TRELEGY ELLIPTA ) 200-62.5-25 MCG/ACT AEPB Inhale 1 puff by mouth into the lungs daily. 09/30/24   Alvia Bring, DO  folic acid  (FOLVITE ) 1 MG tablet Take 1 tablet (1 mg total) by mouth daily. 03/31/24     guaiFENesin  (MUCINEX ) 600 MG 12 hr tablet Take 600 mg by mouth 2 (two) times daily as needed for to loosen phlegm or cough.    [provider]  HYDROcodone  bit-homatropine (HYDROMET) 5-1.5 MG/5ML syrup Take 5 mLs by mouth every 6 (six) hours as needed for cough. 08/12/24   Alvia Bring, DO  [Paused] lisinopril -hydrochlorothiazide  (ZESTORETIC ) 20-25 MG tablet Take 1 tablet by mouth daily. Wait to take this until your doctor or other care provider tells you to start again. 08/12/24 08/12/25  Alvia Bring, DO  [Paused] methotrexate  250 MG/10ML injection Inject 1 mL (25 mg total) into the muscle once a week. Wait to take this until your doctor or other care provider tells you to start again. 12/10/23     niacin  (,VITAMIN B3,) 500 MG tablet Take 1 tablet (500 mg total) by mouth at bedtime. 08/21/23   Curtis Debby PARAS, MD  omega-3  acid ethyl esters (LOVAZA ) 1 g capsule Take 1 g by mouth 2 (two) times daily.    [provider]  ondansetron  (ZOFRAN -ODT) 8 MG disintegrating tablet Take 1 tablet (8 mg total) by mouth every 8 (eight) hours as needed for nausea. Patient taking differently: Take 8 mg by mouth every 8 (eight) hours as needed for nausea (dissolve orally). 12/04/23   Curtis Debby PARAS, MD  predniSONE  (DELTASONE ) 5 MG tablet Take 5 mg by mouth daily with breakfast.    [provider]  sodium chloride  HYPERTONIC 3 % nebulizer solution Take by nebulization 2 (two) times daily as needed. Patient taking differently: Take 4 mLs by nebulization 2 (two) times daily as needed for cough. 05/07/24   Kara Dorn NOVAK, MD  TRELEGY ELLIPTA  200-62.5-25 MCG/ACT AEPB Inhale 1 puff into the lungs in the morning.    [provider]    Physical Exam: BP 114/70  Pulse (!) 102   Temp 98.7 F (37.1 C)   Resp 20   SpO2 93%  General:  Alert, oriented, calm, in no acute distress, looks nontoxic.  He is resting comfortably on room air speaking in full sentences.  His wife is at the bedside. Cardiovascular: RRR, no murmurs or rubs, no peripheral edema  Respiratory: clear to auscultation bilaterally, no wheezes, no crackles  Abdomen: soft, nontender, nondistended, normal bowel tones heard  Skin: dry, no rashes  Musculoskeletal: no joint effusions, normal range of motion  Psychiatric: appropriate affect, normal speech  Neurologic: extraocular muscles intact, clear speech, moving all extremities with intact sensorium         Labs on Admission:  Basic Metabolic Panel: Recent Labs  Lab 10/07/24 1943  NA 137  K 4.7  CL 103  CO2 22  GLUCOSE 116*  BUN 20  CREATININE 1.26*  CALCIUM  10.9*   Liver Function Tests: Recent Labs  Lab 10/07/24 1943  AST 18  ALT 14  ALKPHOS 103  BILITOT 0.5  PROT 7.3  ALBUMIN 3.7   No results for input(s): LIPASE, AMYLASE in the last 168 hours. No results for  input(s): AMMONIA in the last 168 hours. CBC: Recent Labs  Lab 10/07/24 1943  WBC 19.0*  NEUTROABS 15.6*  HGB 13.0  HCT 41.9  MCV 87.5  PLT 411*   Cardiac Enzymes: No results for input(s): CKTOTAL, CKMB, CKMBINDEX, TROPONINI in the last 168 hours. BNP (last 3 results) Recent Labs    08/12/24 1131  BNP 13.9    ProBNP (last 3 results) Recent Labs    08/26/24 1628  PROBNP 155.0    CBG: No results for input(s): GLUCAP in the last 168 hours.  Radiological Exams on Admission: CT CHEST W CONTRAST Result Date: 10/08/2024 CLINICAL DATA:  evaluate lung mass vs abscess. EXAM: CT CHEST WITH CONTRAST TECHNIQUE: Multidetector CT imaging of the chest was performed during intravenous contrast administration. RADIATION DOSE REDUCTION: This exam was performed according to the departmental dose-optimization program which includes automated exposure control, adjustment of the mA and/or kV according to patient size and/or use of iterative reconstruction technique. CONTRAST:  75mL OMNIPAQUE  IOHEXOL  300 MG/ML  SOLN COMPARISON:  CT scan chest from 09/29/2024. FINDINGS: Cardiovascular: Normal cardiac size. No pericardial effusion. No aortic aneurysm. There are coronary artery calcifications, in keeping with coronary artery disease. There are also mild peripheral atherosclerotic vascular calcifications of thoracic aorta and its major branches. Mediastinum/Nodes: Visualized thyroid  gland appears grossly unremarkable. No solid / cystic mediastinal masses. The esophagus is nondistended precluding optimal assessment. There are few mildly prominent mediastinal and hilar lymph nodes, which do not meet the size criteria for lymphadenopathy and appear grossly similar to the prior study, favoring benign/reactive etiology. No axillary lymphadenopathy by size criteria. Lungs/Pleura: The central tracheo-bronchial tree is patent. Redemonstration of well-circumscribed 5.0 x 6.2 x 6.3 cm low-attenuation area in  the right lung lower lobe, posteriorly. There is surrounding slightly irregular, hyperattenuating wall as well as surrounding atelectatic changes and volume loss in the right lower lobe. There is associated trace right pleural effusion. Findings favor right lung lower lobe lung abscess. There also atelectatic changes in the middle lobe. Left lung and right lung upper lobe are unremarkable. No mass, lung collapse, pleural effusion or pneumothorax. There are scattered sub 3 mm calcified granulomas throughout bilateral lungs. No suspicious lung nodule. Upper Abdomen: There are several hypoattenuating structures in bilateral kidneys. There is a small-to-moderate sliding hiatal hernia. Remaining visualized upper abdominal viscera  within normal limits. Musculoskeletal: The visualized soft tissues of the chest wall are grossly unremarkable. No suspicious osseous lesions. There are mild multilevel degenerative changes in the visualized spine. IMPRESSION: 1. Redemonstration of well-circumscribed 5.0 x 6.2 x 6.3 cm low-attenuation area in the right lung lower lobe, posteriorly with surrounding atelectatic changes and volume loss. There is associated trace right pleural effusion. Findings favor right lung lower lobe intrapulmonary abscess. 2. Multiple other nonacute observations, as described above. Aortic Atherosclerosis (ICD10-I70.0). Electronically Signed   By: Ree Molt M.D.   On: 10/08/2024 13:51   DG Chest Port 1 View Result Date: 10/07/2024 EXAM: 1 VIEW(S) XRAY OF THE CHEST 10/07/2024 07:50:00 PM COMPARISON: 08/26/2024 CLINICAL HISTORY: Cough, shortness of breath FINDINGS: LUNGS AND PLEURA: Low lung volumes. Stable elevation of the right hemidiaphragm. Stable focal dense opacity in the right midlung. Left lung is clear. No pleural effusion. No pneumothorax. HEART AND MEDIASTINUM: No acute abnormality of the cardiac and mediastinal silhouettes. BONES AND SOFT TISSUES: No acute osseous abnormality. IMPRESSION: 1.  Stable focal dense opacity in the right midlung. 2. Low lung volumes and stable elevation of the right hemidiaphragm. Electronically signed by: Greig Pique MD 10/07/2024 09:08 PM EST RP Workstation: [id number]   Assessment/Plan Todd Valdez is a 63 y.o. male with medical history significant for RA, GERD, hypertriglyceridemia, hypertension, suspected underlying COPD and recent hospitalizations with right-sided pneumonia being admitted to the hospital with continued cough and discovery of pulmonary abscess.   Pulmonary abscess-in the setting of RA, suspected underlying COPD/emphysema and multiple recent hospitalizations for community-acquired pneumonia.  Recently completed a course of Levaquin  750 mg p.o. daily on approximately 12/1. -Inpatient admission -Empiric IV Zosyn -Pulmonology consult, may require bronchoscopy  Hyperlipidemia-Lipitor, fenofibrate   RA-recently resumed his Orencia , is on prednisone  5 mg p.o. daily.  Currently no evidence of adrenal crisis.  DVT prophylaxis: SCDs only, holding pharmacological prophylaxis due to possible biopsy/bronchoscopy.    Code Status: Full Code  Consults called: Pulmonology Dr. Kara  Admission status: The appropriate patient status for this patient is INPATIENT. Inpatient status is judged to be reasonable and necessary in order to provide the required intensity of service to ensure the patient's safety. The patient's presenting symptoms, physical exam findings, and initial radiographic and laboratory data in the context of their chronic comorbidities is felt to place them at high risk for further clinical deterioration. Furthermore, it is not anticipated that the patient will be medically stable for discharge from the hospital within 2 midnights of admission.    I certify that at the point of admission it is my clinical judgment that the patient will require inpatient hospital care spanning beyond 2 midnights from the point of admission due to high  intensity of service, high risk for further deterioration and high frequency of surveillance required  Time spent: 53 minutes  Fergie Sherbert CHRISTELLA Gail MD [facility] Pager [phone]  If 7PM-7AM, please contact night-coverage [email or web address] Password [email or web address]  10/08/2024, 3:22 PM      [1] No Known Allergies  "

## 2024-10-08 NOTE — Progress Notes (Signed)
 ED Pharmacy Antibiotic Sign Off An antibiotic consult was received from an ED provider for zosyn per pharmacy dosing for lung abscess. A chart review was completed to assess appropriateness.   The following one time order(s) were placed:  Zosyn 3.375mg  IV once (30 minute infusion)  Further antibiotic and/or antibiotic pharmacy consults should be ordered by the admitting provider if indicated.   Thank you for allowing pharmacy to be a part of this patient's care.   Marget Hench, [facility]  Clinical Pharmacist 10/08/2024 2:58 PM

## 2024-10-08 NOTE — Plan of Care (Signed)
  Problem: Activity: Goal: Risk for activity intolerance will decrease Outcome: Progressing   Problem: Nutrition: Goal: Adequate nutrition will be maintained Outcome: Progressing   Problem: Coping: Goal: Level of anxiety will decrease Outcome: Progressing   Problem: Elimination: Goal: Will not experience complications related to bowel motility Outcome: Progressing   Problem: Pain Managment: Goal: General experience of comfort will improve and/or be controlled Outcome: Progressing

## 2024-10-09 DIAGNOSIS — J479 Bronchiectasis, uncomplicated: Secondary | ICD-10-CM

## 2024-10-09 DIAGNOSIS — J852 Abscess of lung without pneumonia: Secondary | ICD-10-CM | POA: Diagnosis not present

## 2024-10-09 DIAGNOSIS — M069 Rheumatoid arthritis, unspecified: Secondary | ICD-10-CM

## 2024-10-09 LAB — BASIC METABOLIC PANEL WITH GFR
Anion gap: 12 (ref 5–15)
BUN: 27 mg/dL — ABNORMAL HIGH (ref 8–23)
CO2: 24 mmol/L (ref 22–32)
Calcium: 10.8 mg/dL — ABNORMAL HIGH (ref 8.9–10.3)
Chloride: 100 mmol/L (ref 98–111)
Creatinine, Ser: 1.29 mg/dL — ABNORMAL HIGH (ref 0.61–1.24)
GFR, Estimated: >60 mL/min
Glucose, Bld: 105 mg/dL — ABNORMAL HIGH (ref 70–99)
Potassium: 4.2 mmol/L (ref 3.5–5.1)
Sodium: 136 mmol/L (ref 135–145)

## 2024-10-09 LAB — CBC
HCT: 36.7 % — ABNORMAL LOW (ref 39.0–52.0)
Hemoglobin: 11.4 g/dL — ABNORMAL LOW (ref 13.0–17.0)
MCH: 27 pg (ref 26.0–34.0)
MCHC: 31.1 g/dL (ref 30.0–36.0)
MCV: 86.8 fL (ref 80.0–100.0)
Platelets: 392 K/uL (ref 150–400)
RBC: 4.23 MIL/uL (ref 4.22–5.81)
RDW: 17.2 % — ABNORMAL HIGH (ref 11.5–15.5)
WBC: 15 K/uL — ABNORMAL HIGH (ref 4.0–10.5)
nRBC: 0 % (ref 0.0–0.2)

## 2024-10-09 LAB — SEDIMENTATION RATE: Sed Rate: 74 mm/h — ABNORMAL HIGH (ref 0–16)

## 2024-10-09 LAB — PROCALCITONIN: Procalcitonin: 1.58 ng/mL

## 2024-10-09 LAB — C-REACTIVE PROTEIN: CRP: 20.1 mg/dL — ABNORMAL HIGH

## 2024-10-09 MED ORDER — PANTOPRAZOLE SODIUM 40 MG PO TBEC
80.0000 mg | DELAYED_RELEASE_TABLET | Freq: Every day | ORAL | Status: DC
  Administered 2024-10-10: 80 mg via ORAL
  Filled 2024-10-09: qty 2

## 2024-10-09 NOTE — Anesthesia Preprocedure Evaluation (Addendum)
 "                                  Anesthesia Evaluation  Patient identified by MRN, date of birth, ID band Patient awake    Reviewed: Allergy  & Precautions, NPO status , Patient's Chart, lab work & pertinent test results  History of Anesthesia Complications (+) DIFFICULT AIRWAY and history of anesthetic complications  Airway Mallampati: IV  TM Distance: >3 FB Neck ROM: Full    Dental no notable dental hx. (+) Teeth Intact, Dental Advisory Given   Pulmonary sleep apnea , former smoker   Pulmonary exam normal breath sounds clear to auscultation       Cardiovascular hypertension, Normal cardiovascular exam Rhythm:Regular Rate:Normal     Neuro/Psych    GI/Hepatic ,GERD  Medicated,,  Endo/Other    Renal/GU Lab Results      Component                Value               Date                      NA                       139                 11/01/2023                CL                       107                 11/01/2023                K                        4.0                 11/01/2023                CO2                      22                  11/01/2023                BUN                      27 (H)              11/01/2023                CREATININE               1.24                11/01/2023                GFRNONAA                 >60                 11/01/2023                CALCIUM   9.6                 11/01/2023                ALBUMIN                  4.1                 11/01/2023                GLUCOSE                  127 (H)             11/01/2023                Musculoskeletal  (+) Arthritis , Rheumatoid disorders,    Abdominal   Peds  Hematology Lab Results      Component                Value               Date                      WBC                      22.1 (H)            11/01/2023                HGB                      14.8                11/01/2023                HCT                      43.4                 11/01/2023                MCV                      87.7                11/01/2023                PLT                      289                 11/01/2023              Anesthesia Other Findings   Reproductive/Obstetrics                              Anesthesia Physical Anesthesia Plan  ASA: 3  Anesthesia Plan: General   Post-op Pain Management: Minimal or no pain anticipated   Induction: Intravenous  PONV Risk Score and Plan: Treatment may vary due to age or medical condition and Ondansetron   Airway Management Planned: Video Laryngoscope Planned  Additional Equipment: None  Intra-op Plan:   Post-operative Plan: Extubation in OR  Informed Consent: I have reviewed the patients History and Physical, chart, labs and discussed the procedure including the risks, benefits and alternatives for the proposed anesthesia  with the patient or authorized representative who has indicated his/her understanding and acceptance.     Dental advisory given  Plan Discussed with: CRNA and Anesthesiologist  Anesthesia Plan Comments: (Lung Abscess and Pneumonia)         Anesthesia Quick Evaluation  "

## 2024-10-09 NOTE — Progress Notes (Signed)
" °   10/09/24 2026  BiPAP/CPAP/SIPAP  BiPAP/CPAP/SIPAP Pt Type Adult  Reason BIPAP/CPAP not in use Non-compliant (pt refusing cpap for the night)    "

## 2024-10-09 NOTE — Progress Notes (Signed)
 "  NAME:  Todd Valdez, MRN:  [id number], DOB:  1960-12-25, LOS: 1 ADMISSION DATE:  10/07/2024, CONSULTATION DATE:  10/08/24 REFERRING MD:  EDP CHIEF COMPLAINT:  Lung Abscess   History of Present Illness:  Todd Valdez is a 64 year old male with bronchiectasis and rheumatoid arthritis on methotrexate  and orencia  who presents to the ER today with on going cough, dyspnea and generalized weakness. He reports low grade fevers and no energy.  He was recently hospitalized 10/13 to 10/15 and 11/18 to 11/22 for sepsis due to pneumonia.   CT Chest scan 07/21/24 showed dense right lower lobe consolidation. He was treated with ceftriaxone  and doxycycline  at that time and discharged on course of doxycycline . No sputum or respiratory culture obtained.   CT Chest 07/29/24 ordered by the primary care team showed moderate sized right pleural effusion and decreased patchy and consolidative right lower lobe air space opacity.   During admission 11/8-11/22 he was treated with ceftriaxone  and azithromycin . Discharged on 14 day course of levaquin .   He was seen 11/25 in pulmonary clinic and reported feeling better at that time and was still on the course of levaquin  at that time.   Repeat CT Chest scan 12/22 showed mass like consolidation concerning for lung abscess and CT Chest with contrast was recommended for further evaluation. He was notified of these results on 12/23 by the pulmonary clinic. He reported feeling fine but had continued cough.    Pertinent  Medical History   Past Medical History:  Diagnosis Date   Arthritis    Cancer (HCC)    Melanoma on  back   Diverticulitis    GERD (gastroesophageal reflux disease)    High triglycerides    History of abscess of skin and subcutaneous tissue    right forearm, chin   Hypertension    RA (rheumatoid arthritis) (HCC)    Reflux esophagitis    Right inguinal hernia    Sleep apnea    no cpap     Significant Hospital Events: Including procedures,  antibiotic start and stop dates in addition to other pertinent events   12/31 admitted for lung abscess, started on zosyn  Interim History / Subjective:   No acute events overnight Had a coughing fit this AM and coughed up dark brown colored mucuous Had subjective fever this AM  Objective    Blood pressure 112/76, pulse 97, temperature 98.4 F (36.9 C), resp. rate 15, height 6' (1.829 m), weight 95.7 kg, SpO2 94%.        Intake/Output Summary (Last 24 hours) at 10/09/2024 0744 Last data filed at 10/09/2024 0600 Gross per 24 hour  Intake 2385.86 ml  Output --  Net 2385.86 ml   Filed Weights   10/08/24 1700  Weight: 95.7 kg    Examination: General: no acute distress, sitting up in chair HENT: [place]/AT, moist mucous membranes Lungs: clear to auscultation bilaterally Cardiovascular: rrr, no murmur Abdomen: soft, non-tender, non-distended Extremities: warm, trace edema Neuro: alert, moving all extremities GU: n/a  Labs: ESR 74 CRP 20.1 [place]cal 1.58 MRSA screen negative   Resolved problem list   Assessment and Plan   Bronchiectasis without exacerbation Rheumatoid arthritis on methotrexate  and orencia  Right Lower lobe lung abscess  - follow up blood cultures - Continue Zosyn antibiotic coverage - MRSA Screen negative - Bronchoscopy scheduled for tomorrow morning - NPO after midnight tonight - Plan will be to treat with long term augmentin  therapy unless cultures dictate otherwise - Will need to hold methotrexate   and orencia  for a period of time as well but will need to balance his RA symptoms as he did note return of significant joint pain once off medications for a couple of weeks.  PCCM will continue to follow   Labs   CBC: Recent Labs  Lab 10/07/24 1943 10/09/24 0329  WBC 19.0* 15.0*  NEUTROABS 15.6*  --   HGB 13.0 11.4*  HCT 41.9 36.7*  MCV 87.5 86.8  PLT 411* 392    Basic Metabolic Panel: Recent Labs  Lab 10/07/24 1943 10/09/24 0329  NA 137 136   K 4.7 4.2  CL 103 100  CO2 22 24  GLUCOSE 116* 105*  BUN 20 27*  CREATININE 1.26* 1.29*  CALCIUM  10.9* 10.8*   GFR: Estimated Creatinine Clearance: 68.5 mL/min (A) (by C-G formula based on SCr of 1.29 mg/dL (H)). Recent Labs  Lab 10/07/24 1943 10/08/24 1256 10/09/24 0329  WBC 19.0*  --  15.0*  LATICACIDVEN  --  1.4  --     Liver Function Tests: Recent Labs  Lab 10/07/24 1943  AST 18  ALT 14  ALKPHOS 103  BILITOT 0.5  PROT 7.3  ALBUMIN 3.7   No results for input(s): LIPASE, AMYLASE in the last 168 hours. No results for input(s): AMMONIA in the last 168 hours.  ABG    Component Value Date/Time   HCO3 21.1 07/21/2024 0610   TCO2 22 07/21/2024 0610   ACIDBASEDEF 2.0 07/21/2024 0610   O2SAT 99 07/21/2024 0610     Coagulation Profile: No results for input(s): INR, PROTIME in the last 168 hours.  Cardiac Enzymes: No results for input(s): CKTOTAL, CKMB, CKMBINDEX, TROPONINI in the last 168 hours.  HbA1C: Hgb A1c MFr Bld  Date/Time Value Ref Range Status  10/22/2023 09:12 AM 5.7 (H) 4.8 - 5.6 % Final    Comment:             Prediabetes: 5.7 - 6.4          Diabetes: >6.4          Glycemic control for adults with diabetes: <7.0   11/08/2022 08:09 AM 5.8 (H) <5.7 % of total Hgb Final    Comment:    For someone without known diabetes, a hemoglobin  A1c value between 5.7% and 6.4% is consistent with prediabetes and should be confirmed with a  follow-up test. . For someone with known diabetes, a value <7% indicates that their diabetes is well controlled. A1c targets should be individualized based on duration of diabetes, age, comorbid conditions, and other considerations. . This assay result is consistent with an increased risk of diabetes. . Currently, no consensus exists regarding use of hemoglobin A1c for diagnosis of diabetes for children. .     CBG: No results for input(s): GLUCAP in the last 168 hours.     Critical care  time: n/a    Dorn Chill, MD [facility] Office: [phone]   See Amion for personal pager PCCM on call pager [phone] until 7pm. Please call Elink 7p-7a. [phone]          "

## 2024-10-09 NOTE — Progress Notes (Signed)
 " PROGRESS NOTE    Todd Valdez  FMW:[id number] DOB: 12-Dec-1960 DOA: 10/07/2024 PCP: Alvia Bring, DO    Brief Narrative:   Todd Valdez is a 64 y.o. male with past medical history significant for HTN, rheumatoid arthritis on MTX/Orencia , bronchiectasis, possible underlying COPD, HLD, GERD, melanoma who presented to [facility] ED on 10/07/2024 with complaints of cough, shortness of breath and low-grade fever.  2 recent hospitalizations for pneumonia, complicated by immunosuppression from rheumatoid arthritis.  Recently completed 14-day course of Levaquin .  Reports was feeling much improved but now with recurrence of symptoms.  Was seen by pulmonology in follow-up, had CT chest performed 12/22 with findings of masslike consolidation in posterior right lower lobe measuring 7.2 x 4.2 cm concerning for evolving organizing pneumonia with pulmonary abscess.  In the ED, temperature 98.0 F, HR 107, RR 18, BP 117/73, SpO2 93% on room air.  WBC 19.0, hemoglobin 13.0 and platelet count 411.  Sodium 137, potassium 4.7, chloride 103, CO2 22, glucose 116, BUN 20, creat 1.26.  AST 18, ALT 14, total bili 0.5.  COVID/influenza/RSV PCR negative.  CT chest with contrast with redemonstration of well circumscribed 5.0 x 6.2 x 6.3 cm low-tension area right lung lower lobe consistent with intrapulmonary abscess.  Patient was started on empiric antibiotics, given DuoNeb, Tylenol .  Pulmonology consulted.  TRH consulted for admission for further evaluation management of pulmonary abscess  Assessment & Plan:   Right lower lobe pulmonary abscess Patient presenting with recurrent shortness of breath, cough and low-grade fever.  WBC count elevated 19.0.  CT chest with findings of 5.0 x 6.2 x 6.3 cm intrapulmonary abscess right lower lobe.  Recent recurrent pneumonia complicated by immunosuppression from rheumatoid arthritis treatment.  MRSA PCR negative. -- Pulmonology following, appreciate assistance -- WBC  19.0>15.0 -- Blood cultures x 2: No growth less than 24 hours -- Zosyn 3.375 g IV every 8 hours -- NPO after midnight for planned bronchoscopy planned for tomorrow 10/10/2024  HTN Previously on hydrochlorothiazide  and lisinopril , discontinued during last hospitalization due to normotension off of antihypertensives. -- BP 112/76 -- Continue to monitor BP off antihypertensives  Rheumatoid arthritis At baseline on methotrexate , Orencia  and prednisone .  Follows with [facility] rheumatology.  Likely contributing factor to his recurrent pneumonias now developing pulmonary abscess as above.  Will need to hold immunosuppressants in order to adequately treat infectious process as above.  Will need close follow-up with rheumatology and coordination with pulmonology. -- Holding MTX/Orencia  -- Prednisone  5 mg p.o. daily  History of bronchiectasis Concern for underlying COPD On Trelegy Ellipta  at baseline. -- Breztri  2 puffs twice daily as hospital alternative -- Albuterol  neb every 2 hours.  Wheezing/shortness of breath  HLD -- Atorvastatin  40 mg p.o. daily -- Fenofibrate  160 mg p.o. daily -- Continue Repatha  outpatient  GERD -- Protonix  (substituted for home Nexium )   DVT prophylaxis: SCDs Start: 10/08/24 1522    Code Status: Full Code Family Communication: No family present at bedside  Disposition Plan:  Level of care: Med-Surg Status is: Inpatient Remains inpatient appropriate because: IV antibiotics, pending bronchoscopy tomorrow    Consultants:  PCCM, Dr. Kara  Procedures:  None  Antimicrobials:  Zosyn 12/31>>   Subjective: Patient seen examined bedside, sitting in bedside chair.  Eating breakfast.  Continues with nonproductive cough.  WBC count improved.  Discussed with pulmonology, Dr. Kara this morning.  Will be obtaining bronchoscopy for sputum culture/BAL tomorrow.  Remains on IV Zosyn.  Patient with no other specific complaints, questions, or  concerns at this  time.  Denies headache, no dizziness, no chest pain, no palpitations, no abdominal pain, no fever/chills/night sweats, no nausea/vomiting/diarrhea, no focal weakness, no fatigue, no paresthesias.  No acute events overnight per nursing staff.  Objective: Vitals:   10/08/24 2148 10/09/24 0552 10/09/24 1210 10/09/24 1308  BP: 125/68 112/76  123/73  Pulse: 93 97  91  Resp: 16 15  17   Temp: 98.2 F (36.8 C) 98.4 F (36.9 C) 98.6 F (37 C) 98.8 F (37.1 C)  TempSrc: Oral  Oral Oral  SpO2:  94%  93%  Weight:      Height:        Intake/Output Summary (Last 24 hours) at 10/09/2024 1526 Last data filed at 10/09/2024 1120 Gross per 24 hour  Intake 2505.86 ml  Output --  Net 2505.86 ml   Filed Weights   10/08/24 1700  Weight: 95.7 kg    Examination:  Physical Exam: GEN: NAD, alert and oriented x 3, wd/wn HEENT: NCAT, PERRL, EOMI, sclera clear, MMM PULM: CTAB w/o wheezes/crackles, normal respiratory effort, on room air with SpO2 93% at rest CV: RRR w/o M/G/R GI: abd soft, NTND, + BS MSK: no peripheral edema, moves all extremities independently with preserved muscle strength NEURO: No focal neurological deficit PSYCH: normal mood/affect Integumentary: No concerning rashes/lesions/wounds noted on exposed skin surfaces    Data Reviewed: I have personally reviewed following labs and imaging studies  CBC: Recent Labs  Lab 10/07/24 1943 10/09/24 0329  WBC 19.0* 15.0*  NEUTROABS 15.6*  --   HGB 13.0 11.4*  HCT 41.9 36.7*  MCV 87.5 86.8  PLT 411* 392   Basic Metabolic Panel: Recent Labs  Lab 10/07/24 1943 10/09/24 0329  NA 137 136  K 4.7 4.2  CL 103 100  CO2 22 24  GLUCOSE 116* 105*  BUN 20 27*  CREATININE 1.26* 1.29*  CALCIUM  10.9* 10.8*   GFR: Estimated Creatinine Clearance: 68.5 mL/min (A) (by C-G formula based on SCr of 1.29 mg/dL (H)). Liver Function Tests: Recent Labs  Lab 10/07/24 1943  AST 18  ALT 14  ALKPHOS 103  BILITOT 0.5  PROT 7.3  ALBUMIN 3.7    No results for input(s): LIPASE, AMYLASE in the last 168 hours. No results for input(s): AMMONIA in the last 168 hours. Coagulation Profile: No results for input(s): INR, PROTIME in the last 168 hours. Cardiac Enzymes: No results for input(s): CKTOTAL, CKMB, CKMBINDEX, TROPONINI in the last 168 hours. BNP (last 3 results) Recent Labs    08/26/24 1628  PROBNP 155.0   HbA1C: No results for input(s): HGBA1C in the last 72 hours. CBG: No results for input(s): GLUCAP in the last 168 hours. Lipid Profile: No results for input(s): CHOL, HDL, LDLCALC, TRIG, CHOLHDL, LDLDIRECT in the last 72 hours. Thyroid  Function Tests: No results for input(s): TSH, T4TOTAL, FREET4, T3FREE, THYROIDAB in the last 72 hours. Anemia Panel: No results for input(s): VITAMINB12, FOLATE, FERRITIN, TIBC, IRON, RETICCTPCT in the last 72 hours. Sepsis Labs: Recent Labs  Lab 10/08/24 1256 10/09/24 0329  PROCALCITON  --  1.58  LATICACIDVEN 1.4  --     Recent Results (from the past 240 hours)  Resp panel by RT-PCR (RSV, Flu A&B, Covid) Anterior Nasal Swab     Status: None   Collection Time: 10/07/24  7:47 PM   Specimen: Anterior Nasal Swab  Result Value Ref Range Status   SARS Coronavirus 2 by RT PCR NEGATIVE NEGATIVE Final    Comment: (NOTE) SARS-CoV-2  target nucleic acids are NOT DETECTED.  The SARS-CoV-2 RNA is generally detectable in upper respiratory specimens during the acute phase of infection. The lowest concentration of SARS-CoV-2 viral copies this assay can detect is 138 copies/mL. A negative result does not preclude SARS-Cov-2 infection and should not be used as the sole basis for treatment or other patient management decisions. A negative result may occur with  improper specimen collection/handling, submission of specimen other than nasopharyngeal swab, presence of viral mutation(s) within the areas targeted by this assay, and  inadequate number of viral copies(<138 copies/mL). A negative result must be combined with clinical observations, patient history, and epidemiological information. The expected result is Negative.  Fact Sheet for Patients:  bloggercourse.com  Fact Sheet for Healthcare Providers:  seriousbroker.it  This test is no t yet approved or cleared by the [place]  FDA and  has been authorized for detection and/or diagnosis of SARS-CoV-2 by FDA under an Emergency Use Authorization (EUA). This EUA will remain  in effect (meaning this test can be used) for the duration of the COVID-19 declaration under Section 564(b)(1) of the Act, 21 U.S.C.section 360bbb-3(b)(1), unless the authorization is terminated  or revoked sooner.       Influenza A by PCR NEGATIVE NEGATIVE Final   Influenza B by PCR NEGATIVE NEGATIVE Final    Comment: (NOTE) The Xpert Xpress SARS-CoV-2/FLU/RSV plus assay is intended as an aid in the diagnosis of influenza from Nasopharyngeal swab specimens and should not be used as a sole basis for treatment. Nasal washings and aspirates are unacceptable for Xpert Xpress SARS-CoV-2/FLU/RSV testing.  Fact Sheet for Patients: bloggercourse.com  Fact Sheet for Healthcare Providers: seriousbroker.it  This test is not yet approved or cleared by the [place]  FDA and has been authorized for detection and/or diagnosis of SARS-CoV-2 by FDA under an Emergency Use Authorization (EUA). This EUA will remain in effect (meaning this test can be used) for the duration of the COVID-19 declaration under Section 564(b)(1) of the Act, 21 U.S.C. section 360bbb-3(b)(1), unless the authorization is terminated or revoked.     Resp Syncytial Virus by PCR NEGATIVE NEGATIVE Final    Comment: (NOTE) Fact Sheet for Patients: bloggercourse.com  Fact Sheet for Healthcare  Providers: seriousbroker.it  This test is not yet approved or cleared by the [place]  FDA and has been authorized for detection and/or diagnosis of SARS-CoV-2 by FDA under an Emergency Use Authorization (EUA). This EUA will remain in effect (meaning this test can be used) for the duration of the COVID-19 declaration under Section 564(b)(1) of the Act, 21 U.S.C. section 360bbb-3(b)(1), unless the authorization is terminated or revoked.  Performed at [facility], [address], [place], [place] [address]   Blood culture (routine x 2)     Status: None (Preliminary result)   Collection Time: 10/08/24 12:48 PM   Specimen: BLOOD  Result Value Ref Range Status   Specimen Description   Final    BLOOD BLOOD RIGHT ARM Performed at [facility], [address] [place], [place] [address]    Special Requests   Final    BOTTLES DRAWN AEROBIC AND ANAEROBIC Blood Culture results may not be optimal due to an inadequate volume of blood received in culture bottles Performed at [facility], [address] [place], [place] [address]    Culture   Final    NO GROWTH < 24 HOURS Performed at [facility], [address], [place], [place] [address]  Report Status PENDING  Incomplete  Blood culture (routine x 2)     Status: None (Preliminary result)   Collection Time: 10/08/24 12:52 PM   Specimen: BLOOD  Result Value Ref Range Status   Specimen Description   Final    BLOOD RIGHT ANTECUBITAL Performed at [facility], [address] [place], [place] [address]    Special Requests   Final    BOTTLES DRAWN AEROBIC AND ANAEROBIC Blood Culture results may not be optimal due to an inadequate volume of blood received in culture bottles Performed at [facility], [address] [place], [place] [address]    Culture   Final    NO GROWTH < 24 HOURS Performed at [facility], [address] [place], [place] [address]    Report Status PENDING  Incomplete  MRSA Next Gen by PCR, Nasal     Status: None   Collection Time: 10/08/24  5:33 PM   Specimen: Nasal Mucosa; Nasal Swab  Result Value Ref Range Status   MRSA by PCR Next Gen NOT DETECTED NOT DETECTED Final    Comment: (NOTE) The GeneXpert MRSA Assay (FDA approved for NASAL specimens only), is one component of a comprehensive MRSA colonization surveillance program. It is not intended to diagnose MRSA infection nor to guide or monitor treatment for MRSA infections. Test performance is not FDA approved in patients less than 43 years old. Performed at [facility], [address] [place], [place] [address]          Radiology Studies: CT CHEST W CONTRAST Result Date: 10/08/2024 CLINICAL DATA:  evaluate lung mass vs abscess. EXAM: CT CHEST WITH CONTRAST TECHNIQUE: Multidetector CT imaging of the chest was performed during intravenous contrast administration. RADIATION DOSE REDUCTION: This exam was performed according to the departmental dose-optimization program which includes automated exposure control, adjustment of the mA and/or kV according to patient size and/or use of iterative reconstruction technique. CONTRAST:  75mL OMNIPAQUE  IOHEXOL  300 MG/ML  SOLN COMPARISON:  CT scan chest from 09/29/2024. FINDINGS: Cardiovascular: Normal cardiac size. No pericardial effusion. No aortic aneurysm. There are coronary artery calcifications, in keeping with coronary artery disease. There are also mild peripheral atherosclerotic vascular calcifications of thoracic aorta and its major branches. Mediastinum/Nodes: Visualized thyroid  gland appears grossly unremarkable. No solid / cystic mediastinal masses. The esophagus is nondistended precluding optimal assessment. There are few mildly prominent mediastinal and hilar lymph nodes, which do not meet the size criteria for lymphadenopathy and appear  grossly similar to the prior study, favoring benign/reactive etiology. No axillary lymphadenopathy by size criteria. Lungs/Pleura: The central tracheo-bronchial tree is patent. Redemonstration of well-circumscribed 5.0 x 6.2 x 6.3 cm low-attenuation area in the right lung lower lobe, posteriorly. There is surrounding slightly irregular, hyperattenuating wall as well as surrounding atelectatic changes and volume loss in the right lower lobe. There is associated trace right pleural effusion. Findings favor right lung lower lobe lung abscess. There also atelectatic changes in the middle lobe. Left lung and right lung upper lobe are unremarkable. No mass, lung collapse, pleural effusion or pneumothorax. There are scattered sub 3 mm calcified granulomas throughout bilateral lungs. No suspicious lung nodule. Upper Abdomen: There are several hypoattenuating structures in bilateral kidneys. There is a small-to-moderate sliding hiatal hernia. Remaining visualized upper abdominal viscera within normal limits. Musculoskeletal: The visualized soft tissues of the chest wall are grossly unremarkable. No suspicious osseous lesions. There are mild multilevel degenerative changes in the visualized spine.  IMPRESSION: 1. Redemonstration of well-circumscribed 5.0 x 6.2 x 6.3 cm low-attenuation area in the right lung lower lobe, posteriorly with surrounding atelectatic changes and volume loss. There is associated trace right pleural effusion. Findings favor right lung lower lobe intrapulmonary abscess. 2. Multiple other nonacute observations, as described above. Aortic Atherosclerosis (ICD10-I70.0). Electronically Signed   By: Ree Molt M.D.   On: 10/08/2024 13:51   DG Chest Port 1 View Result Date: 10/07/2024 EXAM: 1 VIEW(S) XRAY OF THE CHEST 10/07/2024 07:50:00 PM COMPARISON: 08/26/2024 CLINICAL HISTORY: Cough, shortness of breath FINDINGS: LUNGS AND PLEURA: Low lung volumes. Stable elevation of the right hemidiaphragm.  Stable focal dense opacity in the right midlung. Left lung is clear. No pleural effusion. No pneumothorax. HEART AND MEDIASTINUM: No acute abnormality of the cardiac and mediastinal silhouettes. BONES AND SOFT TISSUES: No acute osseous abnormality. IMPRESSION: 1. Stable focal dense opacity in the right midlung. 2. Low lung volumes and stable elevation of the right hemidiaphragm. Electronically signed by: Greig Pique MD 10/07/2024 09:08 PM EST RP Workstation: [id number]        Scheduled Meds:  atorvastatin   40 mg Oral q1800   budesonide -glycopyrrolate -formoterol   2 puff Inhalation BID   fenofibrate   160 mg Oral Daily   folic acid   1 mg Oral Daily   predniSONE   5 mg Oral Q breakfast   Continuous Infusions:  piperacillin-tazobactam (ZOSYN)  IV 3.375 g (10/09/24 1440)     LOS: 1 day    Time spent: 52 minutes spent on 10/09/2024 caring for this patient face-to-face including chart review, ordering labs/tests, documenting, discussion with nursing staff, consultants, updating family and interview/physical exam    Camellia PARAS Lestine Rahe, DO [facility] Available via Epic secure chat 7am-7pm After these hours, please refer to coverage provider listed on [email or web address] 10/09/2024, 3:26 PM   "

## 2024-10-10 ENCOUNTER — Other Ambulatory Visit (HOSPITAL_COMMUNITY): Payer: Self-pay

## 2024-10-10 ENCOUNTER — Encounter (HOSPITAL_COMMUNITY): Payer: Self-pay | Admitting: Internal Medicine

## 2024-10-10 ENCOUNTER — Other Ambulatory Visit: Payer: Self-pay

## 2024-10-10 ENCOUNTER — Encounter (HOSPITAL_COMMUNITY)
Admission: EM | Disposition: A | Discharge status: Home / Self Care | Payer: Self-pay | Source: Home / Self Care | Attending: Internal Medicine

## 2024-10-10 ENCOUNTER — Inpatient Hospital Stay (HOSPITAL_COMMUNITY): Admitting: Anesthesiology

## 2024-10-10 DIAGNOSIS — Z87891 Personal history of nicotine dependence: Secondary | ICD-10-CM

## 2024-10-10 DIAGNOSIS — I10 Essential (primary) hypertension: Secondary | ICD-10-CM | POA: Diagnosis not present

## 2024-10-10 DIAGNOSIS — J479 Bronchiectasis, uncomplicated: Secondary | ICD-10-CM | POA: Diagnosis not present

## 2024-10-10 DIAGNOSIS — G4730 Sleep apnea, unspecified: Secondary | ICD-10-CM | POA: Diagnosis not present

## 2024-10-10 DIAGNOSIS — J852 Abscess of lung without pneumonia: Secondary | ICD-10-CM

## 2024-10-10 DIAGNOSIS — M069 Rheumatoid arthritis, unspecified: Secondary | ICD-10-CM | POA: Diagnosis not present

## 2024-10-10 HISTORY — PX: FLEXIBLE BRONCHOSCOPY: SHX5094

## 2024-10-10 HISTORY — PX: BRONCHIAL WASHINGS: SHX5105

## 2024-10-10 LAB — BASIC METABOLIC PANEL WITH GFR
Anion gap: 12 (ref 5–15)
BUN: 21 mg/dL (ref 8–23)
CO2: 24 mmol/L (ref 22–32)
Calcium: 10.9 mg/dL — ABNORMAL HIGH (ref 8.9–10.3)
Chloride: 100 mmol/L (ref 98–111)
Creatinine, Ser: 1.17 mg/dL (ref 0.61–1.24)
GFR, Estimated: >60 mL/min
Glucose, Bld: 115 mg/dL — ABNORMAL HIGH (ref 70–99)
Potassium: 4.3 mmol/L (ref 3.5–5.1)
Sodium: 135 mmol/L (ref 135–145)

## 2024-10-10 LAB — BODY FLUID CELL COUNT WITH DIFFERENTIAL
Eos, Fluid: 0 %
Lymphs, Fluid: 1 %
Monocyte-Macrophage-Serous Fluid: 5 % — ABNORMAL LOW (ref 50–90)
Neutrophil Count, Fluid: 94 % — ABNORMAL HIGH (ref 0–25)
Total Nucleated Cell Count, Fluid: 275 uL (ref 0–1000)

## 2024-10-10 LAB — CBC
HCT: 36.8 % — ABNORMAL LOW (ref 39.0–52.0)
Hemoglobin: 11.6 g/dL — ABNORMAL LOW (ref 13.0–17.0)
MCH: 27.4 pg (ref 26.0–34.0)
MCHC: 31.5 g/dL (ref 30.0–36.0)
MCV: 87 fL (ref 80.0–100.0)
Platelets: 392 K/uL (ref 150–400)
RBC: 4.23 MIL/uL (ref 4.22–5.81)
RDW: 17.1 % — ABNORMAL HIGH (ref 11.5–15.5)
WBC: 13.3 K/uL — ABNORMAL HIGH (ref 4.0–10.5)
nRBC: 0 % (ref 0.0–0.2)

## 2024-10-10 SURGERY — BRONCHOSCOPY, FLEXIBLE
Anesthesia: General | Laterality: Bilateral

## 2024-10-10 MED ORDER — CHLORHEXIDINE GLUCONATE 0.12 % MT SOLN
OROMUCOSAL | Status: DC | PRN
  Administered 2024-10-10: 15 mL via OROMUCOSAL

## 2024-10-10 MED ORDER — DEXMEDETOMIDINE HCL IN NACL 80 MCG/20ML IV SOLN
INTRAVENOUS | Status: DC | PRN
  Administered 2024-10-10: 8 ug via INTRAVENOUS

## 2024-10-10 MED ORDER — ROCURONIUM BROMIDE 100 MG/10ML IV SOLN
INTRAVENOUS | Status: DC | PRN
  Administered 2024-10-10: 30 mg via INTRAVENOUS

## 2024-10-10 MED ORDER — ONDANSETRON HCL 4 MG/2ML IJ SOLN
INTRAMUSCULAR | Status: DC | PRN
  Administered 2024-10-10: 4 mg via INTRAVENOUS

## 2024-10-10 MED ORDER — IPRATROPIUM-ALBUTEROL 0.5-2.5 (3) MG/3ML IN SOLN
3.0000 mL | Freq: Four times a day (QID) | RESPIRATORY_TRACT | 0 refills | Status: AC | PRN
  Filled 2024-10-10: qty 180, 15d supply, fill #0

## 2024-10-10 MED ORDER — MIDAZOLAM HCL 2 MG/2ML IJ SOLN
INTRAMUSCULAR | Status: AC
  Filled 2024-10-10: qty 2

## 2024-10-10 MED ORDER — LIDOCAINE HCL (CARDIAC) PF 100 MG/5ML IV SOSY
PREFILLED_SYRINGE | INTRAVENOUS | Status: DC | PRN
  Administered 2024-10-10: 100 mg via INTRAVENOUS

## 2024-10-10 MED ORDER — LACTATED RINGERS IV SOLN
INTRAVENOUS | Status: AC | PRN
  Administered 2024-10-10: 1000 mL via INTRAVENOUS

## 2024-10-10 MED ORDER — FENTANYL CITRATE (PF) 100 MCG/2ML IJ SOLN
INTRAMUSCULAR | Status: AC
  Filled 2024-10-10: qty 2

## 2024-10-10 MED ORDER — SUGAMMADEX SODIUM 200 MG/2ML IV SOLN
INTRAVENOUS | Status: DC | PRN
  Administered 2024-10-10: 200 mg via INTRAVENOUS

## 2024-10-10 MED ORDER — FENTANYL CITRATE (PF) 100 MCG/2ML IJ SOLN
INTRAMUSCULAR | Status: DC | PRN
  Administered 2024-10-10: 50 ug via INTRAVENOUS

## 2024-10-10 MED ORDER — PROPOFOL 10 MG/ML IV BOLUS
INTRAVENOUS | Status: DC | PRN
  Administered 2024-10-10: 150 mg via INTRAVENOUS

## 2024-10-10 MED ORDER — LIDOCAINE HCL (PF) 2% IJ FOR NEBU
2.5000 mL | Freq: Once | RESPIRATORY_TRACT | Status: AC
  Administered 2024-10-10: 2.5 mL via RESPIRATORY_TRACT
  Filled 2024-10-10: qty 5

## 2024-10-10 MED ORDER — IPRATROPIUM-ALBUTEROL 0.5-2.5 (3) MG/3ML IN SOLN
3.0000 mL | Freq: Once | RESPIRATORY_TRACT | Status: AC
  Administered 2024-10-10: 3 mL via RESPIRATORY_TRACT

## 2024-10-10 MED ORDER — AMOXICILLIN-POT CLAVULANATE 875-125 MG PO TABS
1.0000 | ORAL_TABLET | Freq: Two times a day (BID) | ORAL | 0 refills | Status: DC
  Filled 2024-10-10: qty 60, 30d supply, fill #0

## 2024-10-10 MED ORDER — CHLORHEXIDINE GLUCONATE 0.12 % MT SOLN
OROMUCOSAL | Status: AC
  Filled 2024-10-10: qty 15

## 2024-10-10 MED ORDER — PROPOFOL 10 MG/ML IV BOLUS
INTRAVENOUS | Status: AC
  Filled 2024-10-10: qty 20

## 2024-10-10 MED ORDER — IPRATROPIUM-ALBUTEROL 0.5-2.5 (3) MG/3ML IN SOLN
RESPIRATORY_TRACT | Status: AC
  Filled 2024-10-10: qty 3

## 2024-10-10 MED ORDER — SUCCINYLCHOLINE CHLORIDE 200 MG/10ML IV SOSY
PREFILLED_SYRINGE | INTRAVENOUS | Status: DC | PRN
  Administered 2024-10-10: 100 mg via INTRAVENOUS

## 2024-10-10 MED ORDER — MIDAZOLAM HCL 5 MG/5ML IJ SOLN
INTRAMUSCULAR | Status: DC | PRN
  Administered 2024-10-10: 1 mg via INTRAVENOUS

## 2024-10-10 MED ORDER — HYDROCODONE BIT-HOMATROP MBR 5-1.5 MG/5ML PO SOLN
5.0000 mL | Freq: Four times a day (QID) | ORAL | 0 refills | Status: DC | PRN
  Filled 2024-10-10: qty 120, 6d supply, fill #0

## 2024-10-10 MED ORDER — LACTATED RINGERS IV SOLN: INTRAVENOUS | Status: DC | PRN

## 2024-10-10 NOTE — Op Note (Signed)
 Bronchoscopy Procedure Note  Todd Valdez  [id number]  05/26/1961  Date:10/10/2024  Time:8:30 AM   Provider Performing:Davione Lenker B Roarke Marciano   Procedure(s):  Flexible bronchoscopy with bronchial alveolar lavage (68375)  Indication(s) Lung Abscess  Consent Risks of the procedure as well as the alternatives and risks of each were explained to the patient and/or caregiver.  Consent for the procedure was obtained and is signed in the bedside chart  Anesthesia General   Time Out Verified patient identification, verified procedure, site/side was marked, verified correct patient position, special equipment/implants available, medications/allergies/relevant history reviewed, required imaging and test results available.   Sterile Technique Usual hand hygiene, masks, gowns, and gloves were used   Procedure Description Bronchoscope advanced through endotracheal tube and into airway.  Airways were examined down to subsegmental level with findings noted below.   Following diagnostic evaluation, BAL(s) performed in RLL with normal saline and return of 35mL fluid  Findings:  - Left bronchial tree with normal appearance - Right bronchial tree with diffuse edema and erythema of the right lower lobe segments - scant thick purulent secretions in the right main stem suctioned to clarity   Complications/Tolerance None; patient tolerated the procedure well. Chest X-ray is not needed post procedure.   EBL Minimal   Specimen(s) RLL BAL sent for cultures, cell count and cytology

## 2024-10-10 NOTE — Progress Notes (Signed)
" °   10/10/24 1132  TOC Brief Assessment  Insurance and Status Reviewed  Patient has primary care physician Yes  Home environment has been reviewed home with spouse  Prior level of function: independent  Prior/Current Home Services No current home services  Social Drivers of Health Review SDOH reviewed no interventions necessary  Readmission risk has been reviewed Yes  Transition of care needs no transition of care needs at this time    "

## 2024-10-10 NOTE — Plan of Care (Signed)
  Problem: Safety: Goal: Ability to remain free from injury will improve Outcome: Progressing   Problem: Pain Managment: Goal: General experience of comfort will improve and/or be controlled Outcome: Progressing   Problem: Elimination: Goal: Will not experience complications related to urinary retention Outcome: Progressing   Problem: Activity: Goal: Risk for activity intolerance will decrease Outcome: Progressing

## 2024-10-10 NOTE — Progress Notes (Signed)
 Discharge meds in a secure bag delivered to patient by this RN.  AVS reviewed with patient and wife - both verbalized an understanding. PIV x 2 removed as noted. Patient dressed for discharge to home

## 2024-10-10 NOTE — Anesthesia Procedure Notes (Signed)
 Procedure Name: Intubation Date/Time: 10/10/2024 7:47 AM  Performed by: Buster Catheryn SAUNDERS, CRNAPre-anesthesia Checklist: Patient identified, Emergency Drugs available, Suction available and Patient being monitored Patient Re-evaluated:Patient Re-evaluated prior to induction Oxygen Delivery Method: Circle system utilized Preoxygenation: Pre-oxygenation with 100% oxygen Induction Type: IV induction Ventilation: Mask ventilation without difficulty Laryngoscope Size: Glidescope and 4 Tube type: Oral Tube size: 8.0 mm Number of attempts: 1 Airway Equipment and Method: Stylet and Video-laryngoscopy Placement Confirmation: ETT inserted through vocal cords under direct vision, positive ETCO2 and breath sounds checked- equal and bilateral Secured at: 22 cm Tube secured with: Tape Dental Injury: Teeth and Oropharynx as per pre-operative assessment  Difficulty Due To: Difficulty was anticipated, Difficult Airway- due to large tongue and Difficult Airway- due to limited oral opening Future Recommendations: Recommend- induction with short-acting agent, and alternative techniques readily available

## 2024-10-10 NOTE — Transfer of Care (Signed)
 Immediate Anesthesia Transfer of Care Note  Patient: Todd Valdez  Procedure(s) Performed: BRONCHOSCOPY, FLEXIBLE (Bilateral) IRRIGATION, BRONCHUS  Patient Location: PACU  Anesthesia Type:General  Level of Consciousness: awake, alert , and oriented  Airway & Oxygen Therapy: Patient Spontanous Breathing and Patient connected to face mask oxygen  Post-op Assessment: Report given to RN and Post -op Vital signs reviewed and stable  Post vital signs: Reviewed and stable  Last Vitals:  Vitals Value Taken Time  BP    Temp    Pulse 95 10/10/24 08:17  Resp 16 10/10/24 08:17  SpO2 94 % 10/10/24 08:17  Vitals shown include unfiled device data.  Last Pain:  Vitals:   10/10/24 0702  TempSrc: Temporal  PainSc: 0-No pain         Complications: No notable events documented.

## 2024-10-10 NOTE — Discharge Summary (Signed)
 " Physician Discharge Summary  Todd Valdez FMW:[id number] DOB: [birth year] DOA: 10/07/2024  PCP: Alvia Bring, DO  Admit date: 10/07/2024 Discharge date: 10/10/2024  Admitted From: Home Disposition: Home  Recommendations for Outpatient Follow-up:  Follow up with PCP in 1-2 weeks Follow-up pulmonology, Dr. Kara Continue antibiotics with Augmentin  875-125 mg p.o. twice daily x 30 days, extended therapy thereafter per pulmonology Continue to hold home methotrexate /Orencia  until directed by pulmonology Please obtain BMP/CBC in one week Please follow up on the following pending results: Culture results from bronchoscopy/BAL  Home Health: No Equipment/Devices: None  Discharge Condition: Stable CODE STATUS: Full code Diet recommendation:   History of present illness:  Todd Valdez is a 64 y.o. male with past medical history significant for HTN, rheumatoid arthritis on MTX/Orencia , bronchiectasis, possible underlying COPD, HLD, GERD, melanoma who presented to [facility] ED on 10/07/2024 with complaints of cough, shortness of breath and low-grade fever.  2 recent hospitalizations for pneumonia, complicated by immunosuppression from rheumatoid arthritis.  Recently completed 14-day course of Levaquin .  Reports was feeling much improved but now with recurrence of symptoms.  Was seen by pulmonology in follow-up, had CT chest performed 12/22 with findings of masslike consolidation in posterior right lower lobe measuring 7.2 x 4.2 cm concerning for evolving organizing pneumonia with pulmonary abscess.   In the ED, temperature 98.0 F, HR 107, RR 18, BP 117/73, SpO2 93% on room air.  WBC 19.0, hemoglobin 13.0 and platelet count 411.  Sodium 137, potassium 4.7, chloride 103, CO2 22, glucose 116, BUN 20, creat 1.26.  AST 18, ALT 14, total bili 0.5.  COVID/influenza/RSV PCR negative.  CT chest with contrast with redemonstration of well circumscribed 5.0 x 6.2 x 6.3 cm low-tension area right lung  lower lobe consistent with intrapulmonary abscess.  Patient was started on empiric antibiotics, given DuoNeb, Tylenol .  Pulmonology consulted.  TRH consulted for admission for further evaluation management of pulmonary abscess  Hospital course:  Right lower lobe pulmonary abscess Patient presenting with recurrent shortness of breath, cough and low-grade fever.  WBC count elevated 19.0.  CT chest with findings of 5.0 x 6.2 x 6.3 cm intrapulmonary abscess right lower lobe.  Recent recurrent pneumonia complicated by immunosuppression from rheumatoid arthritis treatment.  MRSA PCR negative.  Patient was started on empiric antibiotics with Zosyn.  WBC count improved from 19.0-13.3 at time of discharge.  Patient underwent bronchoscopy with BAL by pulmonology, Dr. Kara on 10/10/2024.  Okay for discharge home on Augmentin  875-125 mg p.o. twice daily x 30 days, pulmonology to determine extended therapy thereafter outpatient.  Follow-up culture results from BAL.  Continue to hold home Orencia /methotrexate .   HTN Previously on hydrochlorothiazide  and lisinopril , discontinued during last hospitalization due to normotension off of antihypertensives.   Rheumatoid arthritis At baseline on methotrexate , Orencia  and prednisone .  Follows with [facility] rheumatology.  Likely contributing factor to his recurrent pneumonias now developing pulmonary abscess as above.  Will need to hold immunosuppressants in order to adequately treat infectious process as above.  Will need close follow-up with rheumatology and coordination with pulmonology. Holding MTX/Orencia , continue prednisone  5 m p.o. daily.   History of bronchiectasis Concern for underlying COPD On Trelegy Ellipta  at baseline.  DuoNebs as needed.   HLD Atorvastatin  40 mg p.o. daily, Fenofibrate  160 mg p.o. daily, Continue Repatha  outpatient   GERD Continue Nexium .    Discharge Diagnoses:  Principal Problem:   Pulmonary abscess [facility])    Discharge  Instructions  Discharge Instructions  Call MD for:  difficulty breathing, headache or visual disturbances   Complete by: As directed    Call MD for:  extreme fatigue   Complete by: As directed    Call MD for:  persistant dizziness or light-headedness   Complete by: As directed    Call MD for:  persistant nausea and vomiting   Complete by: As directed    Call MD for:  severe uncontrolled pain   Complete by: As directed    Call MD for:  temperature >100.4   Complete by: As directed    Increase activity slowly   Complete by: As directed       Allergies as of 10/10/2024   No Known Allergies      Medication List     PAUSE taking these medications    lisinopril -hydrochlorothiazide  20-25 MG tablet Wait to take this until your doctor or other care provider tells you to start again. Commonly known as: ZESTORETIC  Take 1 tablet by mouth daily.   methotrexate  250 MG/10ML injection Wait to take this until your doctor or other care provider tells you to start again. Inject 1 mL (25 mg total) into the muscle once a week.   Orencia  ClickJect 125 MG/ML Soaj Wait to take this until your doctor or other care provider tells you to start again. Generic drug: Abatacept  Inject 125mg  Subcutaneous once a week       STOP taking these medications    cyclobenzaprine  10 MG tablet Commonly known as: FLEXERIL    doxycycline  50 MG tablet Commonly known as: ADOXA   Repatha  SureClick 140 MG/ML Soaj Generic drug: Evolocumab        TAKE these medications    acetaminophen  325 MG tablet Commonly known as: TYLENOL  Take 325-650 mg by mouth daily as needed for mild pain (pain score 1-3) or moderate pain (pain score 4-6).   albuterol  108 (90 Base) MCG/ACT inhaler Commonly known as: VENTOLIN  HFA Inhale 2 puffs into the lungs every 6 (six) hours as needed for wheezing or shortness of breath.   amoxicillin -clavulanate 875-125 MG tablet Commonly known as: AUGMENTIN  Take 1 tablet by mouth 2  (two) times daily.   ascorbic acid 500 MG tablet Commonly known as: VITAMIN C Take 500 mg by mouth daily.   atorvastatin  40 MG tablet Commonly known as: LIPITOR Take 1 tablet (40 mg total) by mouth daily at 6 PM.   esomeprazole  40 MG capsule Commonly known as: NEXIUM  Take 1 capsule (40 mg total) by mouth daily at 12 noon. What changed: when to take this   fenofibrate  160 MG tablet Take 1 tablet (160 mg total) by mouth daily.   folic acid  1 MG tablet Commonly known as: FOLVITE  Take 1 tablet (1 mg total) by mouth daily.   guaiFENesin  600 MG 12 hr tablet Commonly known as: MUCINEX  Take 600 mg by mouth 2 (two) times daily as needed for to loosen phlegm or cough.   HYDROcodone  bit-homatropine 5-1.5 MG/5ML syrup Commonly known as: Hycodan Take 5 mLs by mouth every 6 (six) hours as needed for cough.   ipratropium-albuterol  0.5-2.5 (3) MG/3ML Soln Commonly known as: DUONEB Take 3 mLs by nebulization every 6 (six) hours as needed (Shortness of breath/wheezing).   niacin  500 MG tablet Commonly known as: (VITAMIN B3) Take 1 tablet (500 mg total) by mouth at bedtime.   omega-3 acid ethyl esters 1 g capsule Commonly known as: LOVAZA  Take 1 g by mouth 2 (two) times daily.   ondansetron  8 MG disintegrating tablet  Commonly known as: ZOFRAN -ODT Take 1 tablet (8 mg total) by mouth every 8 (eight) hours as needed for nausea. What changed: reasons to take this   predniSONE  5 MG tablet Commonly known as: DELTASONE  Take 5 mg by mouth daily with breakfast.   sodium chloride  HYPERTONIC 3 % nebulizer solution Take by nebulization 2 (two) times daily as needed. What changed:  how much to take reasons to take this   Trelegy Ellipta  200-62.5-25 MCG/ACT Aepb Generic drug: Fluticasone -Umeclidin-Vilant Inhale 1 puff by mouth into the lungs daily.        Follow-up Information     Alvia Bring, DO. Schedule an appointment as soon as possible for a visit in 1 week(s).   Specialty:  Family Medicine Contact information: [address]  [address] [place] [address] [phone]         Kara Dorn NOVAK, MD. Schedule an appointment as soon as possible for a visit.   Specialty: Pulmonary Disease Contact information: [address] [place] [address] [phone]                Allergies[1]  Consultations: Pulmonology, Dr. Kara   Procedures/Studies: CT CHEST W CONTRAST Result Date: 10/08/2024 CLINICAL DATA:  evaluate lung mass vs abscess. EXAM: CT CHEST WITH CONTRAST TECHNIQUE: Multidetector CT imaging of the chest was performed during intravenous contrast administration. RADIATION DOSE REDUCTION: This exam was performed according to the departmental dose-optimization program which includes automated exposure control, adjustment of the mA and/or kV according to patient size and/or use of iterative reconstruction technique. CONTRAST:  75mL OMNIPAQUE  IOHEXOL  300 MG/ML  SOLN COMPARISON:  CT scan chest from 09/29/2024. FINDINGS: Cardiovascular: Normal cardiac size. No pericardial effusion. No aortic aneurysm. There are coronary artery calcifications, in keeping with coronary artery disease. There are also mild peripheral atherosclerotic vascular calcifications of thoracic aorta and its major branches. Mediastinum/Nodes: Visualized thyroid  gland appears grossly unremarkable. No solid / cystic mediastinal masses. The esophagus is nondistended precluding optimal assessment. There are few mildly prominent mediastinal and hilar lymph nodes, which do not meet the size criteria for lymphadenopathy and appear grossly similar to the prior study, favoring benign/reactive etiology. No axillary lymphadenopathy by size criteria. Lungs/Pleura: The central tracheo-bronchial tree is patent. Redemonstration of well-circumscribed 5.0 x 6.2 x 6.3 cm low-attenuation area in the right lung lower lobe, posteriorly. There is surrounding slightly  irregular, hyperattenuating wall as well as surrounding atelectatic changes and volume loss in the right lower lobe. There is associated trace right pleural effusion. Findings favor right lung lower lobe lung abscess. There also atelectatic changes in the middle lobe. Left lung and right lung upper lobe are unremarkable. No mass, lung collapse, pleural effusion or pneumothorax. There are scattered sub 3 mm calcified granulomas throughout bilateral lungs. No suspicious lung nodule. Upper Abdomen: There are several hypoattenuating structures in bilateral kidneys. There is a small-to-moderate sliding hiatal hernia. Remaining visualized upper abdominal viscera within normal limits. Musculoskeletal: The visualized soft tissues of the chest wall are grossly unremarkable. No suspicious osseous lesions. There are mild multilevel degenerative changes in the visualized spine. IMPRESSION: 1. Redemonstration of well-circumscribed 5.0 x 6.2 x 6.3 cm low-attenuation area in the right lung lower lobe, posteriorly with surrounding atelectatic changes and volume loss. There is associated trace right pleural effusion. Findings favor right lung lower lobe intrapulmonary abscess. 2. Multiple other nonacute observations, as described above. Aortic Atherosclerosis (ICD10-I70.0). Electronically Signed   By: Ree Molt M.D.   On: 10/08/2024 13:51  DG Chest Port 1 View Result Date: 10/07/2024 EXAM: 1 VIEW(S) XRAY OF THE CHEST 10/07/2024 07:50:00 PM COMPARISON: 08/26/2024 CLINICAL HISTORY: Cough, shortness of breath FINDINGS: LUNGS AND PLEURA: Low lung volumes. Stable elevation of the right hemidiaphragm. Stable focal dense opacity in the right midlung. Left lung is clear. No pleural effusion. No pneumothorax. HEART AND MEDIASTINUM: No acute abnormality of the cardiac and mediastinal silhouettes. BONES AND SOFT TISSUES: No acute osseous abnormality. IMPRESSION: 1. Stable focal dense opacity in the right midlung. 2. Low lung volumes  and stable elevation of the right hemidiaphragm. Electronically signed by: Greig Pique MD 10/07/2024 09:08 PM EST RP Workstation: [id number]   CT CHEST HIGH RESOLUTION Result Date: 09/29/2024 EXAM: HIGH RESOLUTION CHEST 09/29/2024 09:32:08 AM TECHNIQUE: CT of the chest was performed without the administration of intravenous contrast. High resolution CT imaging was performed of the lungs. Multiplanar reformatted images are provided for review. Automated exposure control, iterative reconstruction, and/or weight based adjustment of the mA/kV was utilized to reduce the radiation dose to as low as reasonably achievable. High resolution CT images were performed in the supine inspiration, supine expiration, and prone inspiration positions. COMPARISON: 07/29/2024 chest CT. CLINICAL HISTORY: Recent hospitalization times 2 for pneumonia. FINDINGS: MEDIASTINUM AND LYMPH NODES: Heart: 3 vessel coronary atherosclerosis. Pericardium is unremarkable. Atherosclerotic nonaneurysmal thoracic aorta. Normal caliber main pulmonary artery. No mediastinal, hilar or axillary lymphadenopathy. HRCT FINDINGS AND LUNGS AND PLEURA: No pneumothorax. Small layering right pleural effusion. The central airways are clear. Masslike consolidation in the posterior right lower lobe abutting the small layering right pleural effusion measures 7.2 x 4.2 cm on series 5, image 76, new from prior chest CT, with internal low attenuation and with surrounding curvilinear parenchymal banding and with associated volume loss and distortion. Otherwise no significant pulmonary nodules. Mild centrilobular and paraseptal emphysema. No significant lobular air trapping or evidence of tracheobronchomalacia. No significant subpleural reticulation, ground glass opacity, traction bronchiectasis or frank honeycombing. UPPER ABDOMEN: Moderate hiatal hernia. SOFT TISSUES AND BONES: Mild thoracic spondylosis. No acute abnormality of the soft tissues. IMPRESSION: 1. Masslike  consolidation in the posterior right lower lobe abutting a small layering right pleural effusion, measuring 7.2 x 4.2 cm, with associated internal low attenuation, surrounding curvilinear parenchymal banding, volume loss and distortion, new from recent 07/29/24 chest CT. Findings are most suggestive of evolving organizing pneumonia, with a central pulmonary abscess not excluded in this location given the internal low attenuation. Further evaluation with chest CT with IV contrast could be obtained at this time or as a 3 month follow-up study, as clinically warranted. 2. No evidence of interstitial lung disease. 3. Mild centrilobular and paraseptal emphysema. 4. Moderate hiatal hernia. 5. Three-vessel coronary atherosclerosis. 6. Aortic Atherosclerosis (ICD10-I70.0). 7. Emphysema (ICD10-J43.9). Electronically signed by: Selinda Blue MD 09/29/2024 02:35 PM EST RP Workstation: [id number]     Subjective: Patient seen examined bedside, sitting in bedside chair.  Spouse present.  Underwent bronchoscopy this morning.  Per pulmonology, Dr. Kara okay for discharge home with outpatient follow-up.  Recommended 1 month of Augmentin  and pulmonology will determine further extended course thereafter.  Patient with no other sputa complaints, concerns or questions at this time.  Denies headache, no dizziness, no chest pain, no palpitations, no abdominal pain, no fever/chills/night sweats, no nausea/vomiting/diarrhea, no focal weakness, no fatigue, no paresthesias.  No acute events overnight per nurse staff.  Discharge Exam: Vitals:   10/10/24 0900 10/10/24 0926  BP: 96/67 103/82  Pulse: 97 99  Resp: (!) 25 16  Temp:  97.9 F (36.6 C)  SpO2: 97% 92%   Vitals:   10/10/24 0850 10/10/24 0857 10/10/24 0900 10/10/24 0926  BP: 112/63  96/67 103/82  Pulse: 95 94 97 99  Resp: (!) 22 (!) 25 (!) 25 16  Temp:    97.9 F (36.6 C)  TempSrc:    Oral  SpO2: (!) 89% 96% 97% 92%  Weight:      Height:        Physical  Exam: GEN: NAD, alert and oriented x 3, wd/wn HEENT: NCAT, PERRL, EOMI, sclera clear, MMM PULM: CTAB w/o wheezes/crackles, normal respiratory effort, on room air with SpO2 92% at rest CV: RRR w/o M/G/R GI: abd soft, NTND, + BS MSK: no peripheral edema, moves all extremities independently with preserved muscle strength NEURO: No focal neurological deficit PSYCH: normal mood/affect Integumentary: No concerning rashes/lesions/wounds noted on exposed skin surfaces      The results of significant diagnostics from this hospitalization (including imaging, microbiology, ancillary and laboratory) are listed below for reference.     Microbiology: Recent Results (from the past 240 hours)  Resp panel by RT-PCR (RSV, Flu A&B, Covid) Anterior Nasal Swab     Status: None   Collection Time: 10/07/24  7:47 PM   Specimen: Anterior Nasal Swab  Result Value Ref Range Status   SARS Coronavirus 2 by RT PCR NEGATIVE NEGATIVE Final    Comment: (NOTE) SARS-CoV-2 target nucleic acids are NOT DETECTED.  The SARS-CoV-2 RNA is generally detectable in upper respiratory specimens during the acute phase of infection. The lowest concentration of SARS-CoV-2 viral copies this assay can detect is 138 copies/mL. A negative result does not preclude SARS-Cov-2 infection and should not be used as the sole basis for treatment or other patient management decisions. A negative result may occur with  improper specimen collection/handling, submission of specimen other than nasopharyngeal swab, presence of viral mutation(s) within the areas targeted by this assay, and inadequate number of viral copies(<138 copies/mL). A negative result must be combined with clinical observations, patient history, and epidemiological information. The expected result is Negative.  Fact Sheet for Patients:  bloggercourse.com  Fact Sheet for Healthcare Providers:  seriousbroker.it  This  test is no t yet approved or cleared by the [place]  FDA and  has been authorized for detection and/or diagnosis of SARS-CoV-2 by FDA under an Emergency Use Authorization (EUA). This EUA will remain  in effect (meaning this test can be used) for the duration of the COVID-19 declaration under Section 564(b)(1) of the Act, 21 U.S.C.section 360bbb-3(b)(1), unless the authorization is terminated  or revoked sooner.       Influenza A by PCR NEGATIVE NEGATIVE Final   Influenza B by PCR NEGATIVE NEGATIVE Final    Comment: (NOTE) The Xpert Xpress SARS-CoV-2/FLU/RSV plus assay is intended as an aid in the diagnosis of influenza from Nasopharyngeal swab specimens and should not be used as a sole basis for treatment. Nasal washings and aspirates are unacceptable for Xpert Xpress SARS-CoV-2/FLU/RSV testing.  Fact Sheet for Patients: bloggercourse.com  Fact Sheet for Healthcare Providers: seriousbroker.it  This test is not yet approved or cleared by the [place]  FDA and has been authorized for detection and/or diagnosis of SARS-CoV-2 by FDA under an Emergency Use Authorization (EUA). This EUA will remain in effect (meaning this test can be used) for the duration of the COVID-19 declaration under Section 564(b)(1) of the Act, 21 U.S.C. section 360bbb-3(b)(1), unless the authorization is terminated or revoked.     Resp  Syncytial Virus by PCR NEGATIVE NEGATIVE Final    Comment: (NOTE) Fact Sheet for Patients: bloggercourse.com  Fact Sheet for Healthcare Providers: seriousbroker.it  This test is not yet approved or cleared by the [place]  FDA and has been authorized for detection and/or diagnosis of SARS-CoV-2 by FDA under an Emergency Use Authorization (EUA). This EUA will remain in effect (meaning this test can be used) for the duration of the COVID-19 declaration under  Section 564(b)(1) of the Act, 21 U.S.C. section 360bbb-3(b)(1), unless the authorization is terminated or revoked.  Performed at [facility], [address] [place], [place] [address]   Blood culture (routine x 2)     Status: None (Preliminary result)   Collection Time: 10/08/24 12:48 PM   Specimen: BLOOD  Result Value Ref Range Status   Specimen Description   Final    BLOOD BLOOD RIGHT ARM Performed at [facility], [address], [place], [place] [address]    Special Requests   Final    BOTTLES DRAWN AEROBIC AND ANAEROBIC Blood Culture results may not be optimal due to an inadequate volume of blood received in culture bottles Performed at [facility], [address] [place], [place] [address]    Culture   Final    NO GROWTH 2 DAYS Performed at [facility], [address] [place], [place] [address]    Report Status PENDING  Incomplete  Blood culture (routine x 2)     Status: None (Preliminary result)   Collection Time: 10/08/24 12:52 PM   Specimen: BLOOD  Result Value Ref Range Status   Specimen Description   Final    BLOOD RIGHT ANTECUBITAL Performed at [facility], [address], [place], [place] [address]    Special Requests   Final    BOTTLES DRAWN AEROBIC AND ANAEROBIC Blood Culture results may not be optimal due to an inadequate volume of blood received in culture bottles Performed at [facility], [address], [place], [place] [address]    Culture   Final    NO GROWTH 2 DAYS Performed at [facility], [address], [place], [place] [address]    Report Status PENDING  Incomplete  MRSA Next Gen by PCR, Nasal     Status: None   Collection Time: 10/08/24  5:33 PM   Specimen: Nasal Mucosa; Nasal Swab  Result Value Ref Range Status   MRSA by PCR Next Gen NOT DETECTED NOT DETECTED Final    Comment: (NOTE) The GeneXpert MRSA Assay (FDA approved for  NASAL specimens only), is one component of a comprehensive MRSA colonization surveillance program. It is not intended to diagnose MRSA infection nor to guide or monitor treatment for MRSA infections. Test performance is not FDA approved in patients less than 35 years old. Performed at [facility], [address], [place], [place] [address]      Labs: BNP (last 3 results) Recent Labs    08/12/24 1131  BNP 13.9   Basic Metabolic Panel: Recent Labs  Lab 10/07/24 1943 10/09/24 0329 10/10/24 0245  NA 137 136 135  K 4.7 4.2 4.3  CL 103 100 100  CO2 22 24 24   GLUCOSE 116* 105* 115*  BUN 20 27* 21  CREATININE 1.26* 1.29* 1.17  CALCIUM  10.9* 10.8* 10.9*   Liver Function Tests: Recent Labs  Lab 10/07/24 1943  AST 18  ALT 14  ALKPHOS 103  BILITOT 0.5  PROT 7.3  ALBUMIN 3.7   No results for input(s): LIPASE, AMYLASE in the last 168 hours. No results for input(s): AMMONIA in the last 168 hours. CBC: Recent Labs  Lab 10/07/24 1943 10/09/24 0329 10/10/24 0245  WBC 19.0* 15.0* 13.3*  NEUTROABS 15.6*  --   --   HGB 13.0 11.4* 11.6*  HCT 41.9 36.7* 36.8*  MCV 87.5 86.8 87.0  PLT 411* 392 392   Cardiac Enzymes: No results for input(s): CKTOTAL, CKMB, CKMBINDEX, TROPONINI in the last 168 hours. BNP: Invalid input(s): POCBNP CBG: No results for input(s): GLUCAP in the last 168 hours. D-Dimer No results for input(s): DDIMER in the last 72 hours. Hgb A1c No results for input(s): HGBA1C in the last 72 hours. Lipid Profile No results for input(s): CHOL, HDL, LDLCALC, TRIG, CHOLHDL, LDLDIRECT in the last 72 hours. Thyroid  function studies No results for input(s): TSH, T4TOTAL, T3FREE, THYROIDAB in the last 72 hours.  Invalid input(s): FREET3 Anemia work up No results for input(s): VITAMINB12, FOLATE, FERRITIN, TIBC, IRON, RETICCTPCT in the last 72 hours. Urinalysis    Component Value  Date/Time   COLORURINE AMBER (A) 08/26/2024 1552   APPEARANCEUR HAZY (A) 08/26/2024 1552   LABSPEC 1.027 08/26/2024 1552   PHURINE 5.0 08/26/2024 1552   GLUCOSEU NEGATIVE 08/26/2024 1552   HGBUR NEGATIVE 08/26/2024 1552   BILIRUBINUR NEGATIVE 08/26/2024 1552   BILIRUBINUR neg 09/14/2011 1654   KETONESUR 5 (A) 08/26/2024 1552   PROTEINUR NEGATIVE 08/26/2024 1552   UROBILINOGEN 0.2 09/14/2011 1654   NITRITE NEGATIVE 08/26/2024 1552   LEUKOCYTESUR NEGATIVE 08/26/2024 1552   Sepsis Labs Recent Labs  Lab 10/07/24 1943 10/09/24 0329 10/10/24 0245  WBC 19.0* 15.0* 13.3*   Microbiology Recent Results (from the past 240 hours)  Resp panel by RT-PCR (RSV, Flu A&B, Covid) Anterior Nasal Swab     Status: None   Collection Time: 10/07/24  7:47 PM   Specimen: Anterior Nasal Swab  Result Value Ref Range Status   SARS Coronavirus 2 by RT PCR NEGATIVE NEGATIVE Final    Comment: (NOTE) SARS-CoV-2 target nucleic acids are NOT DETECTED.  The SARS-CoV-2 RNA is generally detectable in upper respiratory specimens during the acute phase of infection. The lowest concentration of SARS-CoV-2 viral copies this assay can detect is 138 copies/mL. A negative result does not preclude SARS-Cov-2 infection and should not be used as the sole basis for treatment or other patient management decisions. A negative result may occur with  improper specimen collection/handling, submission of specimen other than nasopharyngeal swab, presence of viral mutation(s) within the areas targeted by this assay, and inadequate number of viral copies(<138 copies/mL). A negative result must be combined with clinical observations, patient history, and epidemiological information. The expected result is Negative.  Fact Sheet for Patients:  bloggercourse.com  Fact Sheet for Healthcare Providers:  seriousbroker.it  This test is no t yet approved or cleared by the [place]  [place] FDA and  has been authorized for detection and/or diagnosis of SARS-CoV-2 by FDA under an Emergency Use Authorization (EUA). This EUA will remain  in effect (meaning this test can be used) for the duration of the COVID-19 declaration under Section 564(b)(1) of the Act, 21 U.S.C.section 360bbb-3(b)(1), unless the authorization is terminated  or revoked sooner.       Influenza A by PCR NEGATIVE NEGATIVE Final   Influenza B by PCR NEGATIVE NEGATIVE Final    Comment: (NOTE) The Xpert Xpress SARS-CoV-2/FLU/RSV plus assay is intended as an aid in the diagnosis  of influenza from Nasopharyngeal swab specimens and should not be used as a sole basis for treatment. Nasal washings and aspirates are unacceptable for Xpert Xpress SARS-CoV-2/FLU/RSV testing.  Fact Sheet for Patients: bloggercourse.com  Fact Sheet for Healthcare Providers: seriousbroker.it  This test is not yet approved or cleared by the [place]  FDA and has been authorized for detection and/or diagnosis of SARS-CoV-2 by FDA under an Emergency Use Authorization (EUA). This EUA will remain in effect (meaning this test can be used) for the duration of the COVID-19 declaration under Section 564(b)(1) of the Act, 21 U.S.C. section 360bbb-3(b)(1), unless the authorization is terminated or revoked.     Resp Syncytial Virus by PCR NEGATIVE NEGATIVE Final    Comment: (NOTE) Fact Sheet for Patients: bloggercourse.com  Fact Sheet for Healthcare Providers: seriousbroker.it  This test is not yet approved or cleared by the [place]  FDA and has been authorized for detection and/or diagnosis of SARS-CoV-2 by FDA under an Emergency Use Authorization (EUA). This EUA will remain in effect (meaning this test can be used) for the duration of the COVID-19 declaration under Section 564(b)(1) of the Act, 21 U.S.C. section  360bbb-3(b)(1), unless the authorization is terminated or revoked.  Performed at [facility], [address], [place], [place] [address]   Blood culture (routine x 2)     Status: None (Preliminary result)   Collection Time: 10/08/24 12:48 PM   Specimen: BLOOD  Result Value Ref Range Status   Specimen Description   Final    BLOOD BLOOD RIGHT ARM Performed at [facility], [address], [place], [place] [address]    Special Requests   Final    BOTTLES DRAWN AEROBIC AND ANAEROBIC Blood Culture results may not be optimal due to an inadequate volume of blood received in culture bottles Performed at [facility], [address], [place], [place] [address]    Culture   Final    NO GROWTH 2 DAYS Performed at [facility], [address] [place], [place] [address]    Report Status PENDING  Incomplete  Blood culture (routine x 2)     Status: None (Preliminary result)   Collection Time: 10/08/24 12:52 PM   Specimen: BLOOD  Result Value Ref Range Status   Specimen Description   Final    BLOOD RIGHT ANTECUBITAL Performed at [facility], [address], [place], [place] [address]    Special Requests   Final    BOTTLES DRAWN AEROBIC AND ANAEROBIC Blood Culture results may not be optimal due to an inadequate volume of blood received in culture bottles Performed at [facility], [address], [place], [place] [address]    Culture   Final    NO GROWTH 2 DAYS Performed at [facility], [address], [place], [place] [address]    Report Status PENDING  Incomplete  MRSA Next Gen by PCR, Nasal     Status: None   Collection Time: 10/08/24  5:33 PM   Specimen: Nasal Mucosa; Nasal Swab  Result Value Ref Range Status   MRSA by PCR Next Gen NOT DETECTED NOT DETECTED Final    Comment: (NOTE) The GeneXpert MRSA Assay (FDA approved for NASAL specimens only), is one component of a  comprehensive MRSA colonization surveillance program. It is not intended to diagnose MRSA infection nor to guide or monitor treatment for MRSA infections. Test performance is not FDA approved in patients less than  73 years old. Performed at [facility], [address] [place], [place] [address]      Time coordinating discharge: Over 30 minutes  SIGNED:   Camellia PARAS Ayrianna Mcginniss, DO  [facility] 10/10/2024, 12:52 PM     [1] No Known Allergies  "

## 2024-10-10 NOTE — Anesthesia Postprocedure Evaluation (Signed)
"   Anesthesia Post Note  Patient: Todd Valdez  Procedure(s) Performed: BRONCHOSCOPY, FLEXIBLE (Bilateral) IRRIGATION, BRONCHUS     Patient location during evaluation: Endoscopy Anesthesia Type: General Level of consciousness: awake and alert Pain management: pain level controlled Vital Signs Assessment: post-procedure vital signs reviewed and stable Respiratory status: spontaneous breathing, nonlabored ventilation, respiratory function stable and patient connected to nasal cannula oxygen Cardiovascular status: blood pressure returned to baseline and stable Postop Assessment: no apparent nausea or vomiting Anesthetic complications: no   No notable events documented.  Last Vitals:  Vitals:   10/10/24 0900 10/10/24 0926  BP: 96/67 103/82  Pulse: 97 99  Resp: (!) 25 16  Temp:  36.6 C  SpO2: 97% 92%    Last Pain:  Vitals:   10/10/24 0926  TempSrc: Oral  PainSc:                  Garnette DELENA Gab      "

## 2024-10-10 NOTE — Progress Notes (Signed)
 "  NAME:  Todd Valdez, MRN:  [id number], DOB:  1961-03-28, LOS: 2 ADMISSION DATE:  10/07/2024, CONSULTATION DATE:  10/08/24 REFERRING MD:  EDP CHIEF COMPLAINT:  Lung Abscess   History of Present Illness:  Todd Valdez is a 64 year old male with bronchiectasis and rheumatoid arthritis on methotrexate  and orencia  who presents to the ER today with on going cough, dyspnea and generalized weakness. He reports low grade fevers and no energy.  He was recently hospitalized 10/13 to 10/15 and 11/18 to 11/22 for sepsis due to pneumonia.   CT Chest scan 07/21/24 showed dense right lower lobe consolidation. He was treated with ceftriaxone  and doxycycline  at that time and discharged on course of doxycycline . No sputum or respiratory culture obtained.   CT Chest 07/29/24 ordered by the primary care team showed moderate sized right pleural effusion and decreased patchy and consolidative right lower lobe air space opacity.   During admission 11/8-11/22 he was treated with ceftriaxone  and azithromycin . Discharged on 14 day course of levaquin .   He was seen 11/25 in pulmonary clinic and reported feeling better at that time and was still on the course of levaquin  at that time.   Repeat CT Chest scan 12/22 showed mass like consolidation concerning for lung abscess and CT Chest with contrast was recommended for further evaluation. He was notified of these results on 12/23 by the pulmonary clinic. He reported feeling fine but had continued cough.    Pertinent  Medical History   Past Medical History:  Diagnosis Date   Arthritis    Cancer (HCC)    Melanoma on  back   Diverticulitis    GERD (gastroesophageal reflux disease)    High triglycerides    History of abscess of skin and subcutaneous tissue    right forearm, chin   Hypertension    RA (rheumatoid arthritis) (HCC)    Reflux esophagitis    Right inguinal hernia    Sleep apnea    no cpap     Significant Hospital Events: Including procedures,  antibiotic start and stop dates in addition to other pertinent events   12/31 admitted for lung abscess, started on zosyn  Interim History / Subjective:   No acute events overnight Continues to have coughing fits but no more sputum production He is ready to proceed with bronchoscopy this morning, consent signed  Objective    Blood pressure 124/72, pulse 90, temperature 98.6 F (37 C), temperature source Temporal, resp. rate (!) 22, height 6' (1.829 m), weight 95.7 kg, SpO2 92%.        Intake/Output Summary (Last 24 hours) at 10/10/2024 9261 Last data filed at 10/10/2024 0600 Gross per 24 hour  Intake 514.58 ml  Output --  Net 514.58 ml   Filed Weights   10/08/24 1700 10/10/24 9297  Weight: 95.7 kg 95.7 kg    Examination: General: no acute distress, sitting up in chair HENT: [place]/AT, moist mucous membranes Lungs: clear to auscultation bilaterally Cardiovascular: rrr, no murmur Abdomen: soft, non-tender, non-distended Extremities: warm, trace edema Neuro: alert, moving all extremities GU: n/a  Labs: ESR 74 CRP 20.1 Procal 1.58 MRSA screen negative  WBC: 19 > 15 > 13.3 Resolved problem list   Assessment and Plan   Bronchiectasis without exacerbation Rheumatoid arthritis on methotrexate  and orencia  Right Lower lobe lung abscess  - follow up blood cultures - no growth to date - Continue Zosyn antibiotic coverage - WBC trending down - MRSA Screen negative - Bronchoscopy scheduled for this morning -  Will check on patient post procedure to see if he wants to go home or stay in hospital - Plan will be to treat with long term augmentin  therapy upon discharge unless cultures dictate otherwise - Will need to hold methotrexate  and orencia  for a period of time as well but will need to balance his RA symptoms as he did note return of significant joint pain once off medications for a couple of weeks.  PCCM will continue to follow   Labs   CBC: Recent Labs  Lab  10/07/24 1943 10/09/24 0329 10/10/24 0245  WBC 19.0* 15.0* 13.3*  NEUTROABS 15.6*  --   --   HGB 13.0 11.4* 11.6*  HCT 41.9 36.7* 36.8*  MCV 87.5 86.8 87.0  PLT 411* 392 392    Basic Metabolic Panel: Recent Labs  Lab 10/07/24 1943 10/09/24 0329 10/10/24 0245  NA 137 136 135  K 4.7 4.2 4.3  CL 103 100 100  CO2 22 24 24   GLUCOSE 116* 105* 115*  BUN 20 27* 21  CREATININE 1.26* 1.29* 1.17  CALCIUM  10.9* 10.8* 10.9*   GFR: Estimated Creatinine Clearance: 75.5 mL/min (by C-G formula based on SCr of 1.17 mg/dL). Recent Labs  Lab 10/07/24 1943 10/08/24 1256 10/09/24 0329 10/10/24 0245  PROCALCITON  --   --  1.58  --   WBC 19.0*  --  15.0* 13.3*  LATICACIDVEN  --  1.4  --   --     Liver Function Tests: Recent Labs  Lab 10/07/24 1943  AST 18  ALT 14  ALKPHOS 103  BILITOT 0.5  PROT 7.3  ALBUMIN 3.7   No results for input(s): LIPASE, AMYLASE in the last 168 hours. No results for input(s): AMMONIA in the last 168 hours.  ABG    Component Value Date/Time   HCO3 21.1 07/21/2024 0610   TCO2 22 07/21/2024 0610   ACIDBASEDEF 2.0 07/21/2024 0610   O2SAT 99 07/21/2024 0610     Coagulation Profile: No results for input(s): INR, PROTIME in the last 168 hours.  Cardiac Enzymes: No results for input(s): CKTOTAL, CKMB, CKMBINDEX, TROPONINI in the last 168 hours.  HbA1C: Hgb A1c MFr Bld  Date/Time Value Ref Range Status  10/22/2023 09:12 AM 5.7 (H) 4.8 - 5.6 % Final    Comment:             Prediabetes: 5.7 - 6.4          Diabetes: >6.4          Glycemic control for adults with diabetes: <7.0   11/08/2022 08:09 AM 5.8 (H) <5.7 % of total Hgb Final    Comment:    For someone without known diabetes, a hemoglobin  A1c value between 5.7% and 6.4% is consistent with prediabetes and should be confirmed with a  follow-up test. . For someone with known diabetes, a value <7% indicates that their diabetes is well controlled. A1c targets should be  individualized based on duration of diabetes, age, comorbid conditions, and other considerations. . This assay result is consistent with an increased risk of diabetes. . Currently, no consensus exists regarding use of hemoglobin A1c for diagnosis of diabetes for children. .     CBG: No results for input(s): GLUCAP in the last 168 hours.     Critical care time: n/a    Dorn Chill, MD [facility] Office: [phone]   See Amion for personal pager PCCM on call pager [phone] until 7pm. Please call Elink  7p-7a. [phone]          "

## 2024-10-11 LAB — ACID FAST SMEAR (AFB, MYCOBACTERIA): Acid Fast Smear: NEGATIVE

## 2024-10-12 ENCOUNTER — Encounter (HOSPITAL_COMMUNITY): Payer: Self-pay | Admitting: Pulmonary Disease

## 2024-10-12 LAB — CULTURE, BAL-QUANTITATIVE W GRAM STAIN
Culture: NO GROWTH
Gram Stain: NONE SEEN

## 2024-10-13 ENCOUNTER — Other Ambulatory Visit

## 2024-10-13 LAB — ASPERGILLUS ANTIGEN, BAL/SERUM: Aspergillus Ag, BAL/Serum: 0.06 {index} (ref 0.00–0.49)

## 2024-10-13 LAB — CULTURE, BLOOD (ROUTINE X 2)
Culture: NO GROWTH
Culture: NO GROWTH

## 2024-10-13 LAB — CYTOLOGY - NON PAP

## 2024-10-15 ENCOUNTER — Encounter: Payer: Self-pay | Admitting: Pulmonary Disease

## 2024-10-16 ENCOUNTER — Encounter: Admitting: Sports Medicine

## 2024-10-16 ENCOUNTER — Encounter: Admitting: Urgent Care

## 2024-10-16 LAB — PNEUMOCYSTIS SMEAR, DFA: Pneumocystis Smear, DFA: NEGATIVE

## 2024-10-20 ENCOUNTER — Other Ambulatory Visit (HOSPITAL_COMMUNITY): Payer: Self-pay

## 2024-10-20 ENCOUNTER — Ambulatory Visit: Admitting: Pulmonary Disease

## 2024-10-20 ENCOUNTER — Encounter: Payer: Self-pay | Admitting: Pulmonary Disease

## 2024-10-20 ENCOUNTER — Encounter

## 2024-10-20 ENCOUNTER — Other Ambulatory Visit (HOSPITAL_BASED_OUTPATIENT_CLINIC_OR_DEPARTMENT_OTHER): Payer: Self-pay

## 2024-10-20 VITALS — BP 124/83 | HR 97 | Ht 72.0 in | Wt 209.0 lb

## 2024-10-20 DIAGNOSIS — J851 Abscess of lung with pneumonia: Secondary | ICD-10-CM | POA: Diagnosis not present

## 2024-10-20 DIAGNOSIS — J189 Pneumonia, unspecified organism: Secondary | ICD-10-CM

## 2024-10-20 LAB — CBC WITH DIFFERENTIAL/PLATELET
Basophils Absolute: 0.1 K/uL (ref 0.0–0.1)
Basophils Relative: 0.8 % (ref 0.0–3.0)
Eosinophils Absolute: 0.2 K/uL (ref 0.0–0.7)
Eosinophils Relative: 1 % (ref 0.0–5.0)
HCT: 40.1 % (ref 39.0–52.0)
Hemoglobin: 13 g/dL (ref 13.0–17.0)
Lymphocytes Relative: 8.2 % — ABNORMAL LOW (ref 12.0–46.0)
Lymphs Abs: 1.4 K/uL (ref 0.7–4.0)
MCHC: 32.3 g/dL (ref 30.0–36.0)
MCV: 83.1 fl (ref 78.0–100.0)
Monocytes Absolute: 1 K/uL (ref 0.1–1.0)
Monocytes Relative: 5.8 % (ref 3.0–12.0)
Neutro Abs: 14.6 K/uL — ABNORMAL HIGH (ref 1.4–7.7)
Neutrophils Relative %: 84.2 % — ABNORMAL HIGH (ref 43.0–77.0)
Platelets: 558 K/uL — ABNORMAL HIGH (ref 150.0–400.0)
RBC: 4.83 Mil/uL (ref 4.22–5.81)
RDW: 18 % — ABNORMAL HIGH (ref 11.5–15.5)
WBC: 17.4 K/uL — ABNORMAL HIGH (ref 4.0–10.5)

## 2024-10-20 LAB — C-REACTIVE PROTEIN: CRP: 0.8 mg/dL — ABNORMAL LOW (ref 1.0–20.0)

## 2024-10-20 LAB — SEDIMENTATION RATE: Sed Rate: 55 mm/h — ABNORMAL HIGH (ref 0–20)

## 2024-10-20 MED ORDER — HYDROCODONE BIT-HOMATROP MBR 5-1.5 MG/5ML PO SOLN
5.0000 mL | Freq: Four times a day (QID) | ORAL | 0 refills | Status: AC | PRN
  Filled 2024-10-20: qty 240, 12d supply, fill #0

## 2024-10-20 MED ORDER — AMOXICILLIN-POT CLAVULANATE 875-125 MG PO TABS
1.0000 | ORAL_TABLET | Freq: Two times a day (BID) | ORAL | 1 refills | Status: AC
  Filled 2024-10-20 – 2024-11-03 (×2): qty 60, 30d supply, fill #0

## 2024-10-20 NOTE — Patient Instructions (Signed)
 We will check labs today  Continue augmentin  twice daily  Will repeat a CT Chest scan in early February  Follow up in 5 weeks

## 2024-10-20 NOTE — Assessment & Plan Note (Signed)
" °  Orders:   HYDROcodone  bit-homatropine (HYCODAN) 5-1.5 MG/5ML syrup; Take 5 mLs by mouth every 6 (six) hours as needed for cough.  "

## 2024-10-20 NOTE — Assessment & Plan Note (Signed)
" °  Orders:   amoxicillin -clavulanate (AUGMENTIN ) 875-125 MG tablet; Take 1 tablet by mouth 2 (two) times daily.   Sed Rate (ESR); Future   C-reactive protein; Future   CBC with Differential/Platelet; Future   CT Chest Wo Contrast; Future  "

## 2024-10-20 NOTE — Progress Notes (Unsigned)
 "  Established Patient Pulmonology Office Visit   Subjective:  Patient ID: Todd Valdez, male    DOB: 1961/10/02  MRN: [id number]  CC: No chief complaint on file.   Discussed the use of AI scribe software for clinical note transcription with the patient, who gave verbal consent to proceed.  History of Present Illness       {PULM QUESTIONNAIRES (Optional):33196}  ROS  {History (Optional):23778} Current Medications[1]      Objective:  There were no vitals taken for this visit.  {Pulm Vitals (Optional):32837}  Physical Exam   Diagnostic Review:  {Labs (Optional):32838}     Assessment & Plan:   Assessment & Plan Community acquired pneumonia of right lower lobe of lung  Orders:   HYDROcodone  bit-homatropine (HYCODAN) 5-1.5 MG/5ML syrup; Take 5 mLs by mouth every 6 (six) hours as needed for cough.  Abscess of lower lobe of right lung with pneumonia (HCC)  Orders:   amoxicillin -clavulanate (AUGMENTIN ) 875-125 MG tablet; Take 1 tablet by mouth 2 (two) times daily.   Sed Rate (ESR); Future   C-reactive protein; Future   CBC with Differential/Platelet; Future   CT Chest Wo Contrast; Future   Assessment and Plan Assessment & Plan       No follow-ups on file.   Dorn KATHEE Chill, MD     [1]  Current Outpatient Medications:    [Paused] Abatacept  (ORENCIA  CLICKJECT) 125 MG/ML SOAJ, Inject 125mg  Subcutaneous once a week (Patient not taking: Reported on 10/08/2024), Disp: 4 mL, Rfl: 4   acetaminophen  (TYLENOL ) 325 MG tablet, Take 325-650 mg by mouth daily as needed for mild pain (pain score 1-3) or moderate pain (pain score 4-6)., Disp: , Rfl:    albuterol  (VENTOLIN  HFA) 108 (90 Base) MCG/ACT inhaler, Inhale 2 puffs into the lungs every 6 (six) hours as needed for wheezing or shortness of breath., Disp: 6.7 g, Rfl: 6   amoxicillin -clavulanate (AUGMENTIN ) 875-125 MG tablet, Take 1 tablet by mouth 2 (two) times daily., Disp: 60 tablet, Rfl: 0   ascorbic acid  (VITAMIN C) 500 MG tablet, Take 500 mg by mouth daily., Disp: , Rfl:    atorvastatin  (LIPITOR) 40 MG tablet, Take 1 tablet (40 mg total) by mouth daily at 6 PM., Disp: 90 tablet, Rfl: 1   esomeprazole  (NEXIUM ) 40 MG capsule, Take 1 capsule (40 mg total) by mouth daily at 12 noon. (Patient taking differently: Take 40 mg by mouth daily before breakfast.), Disp: 90 capsule, Rfl: 3   fenofibrate  160 MG tablet, Take 1 tablet (160 mg total) by mouth daily., Disp: 90 tablet, Rfl: 2   Fluticasone -Umeclidin-Vilant (TRELEGY ELLIPTA ) 200-62.5-25 MCG/ACT AEPB, Inhale 1 puff by mouth into the lungs daily., Disp: 60 each, Rfl: 6   folic acid  (FOLVITE ) 1 MG tablet, Take 1 tablet (1 mg total) by mouth daily., Disp: 90 tablet, Rfl: 2   guaiFENesin  (MUCINEX ) 600 MG 12 hr tablet, Take 600 mg by mouth 2 (two) times daily as needed for to loosen phlegm or cough., Disp: , Rfl:    HYDROcodone  bit-homatropine (HYCODAN) 5-1.5 MG/5ML syrup, Take 5 mLs by mouth every 6 (six) hours as needed for cough., Disp: 120 mL, Rfl: 0   ipratropium-albuterol  (DUONEB) 0.5-2.5 (3) MG/3ML SOLN, Take 3 mLs by nebulization every 6 (six) hours as needed (Shortness of breath/wheezing)., Disp: 360 mL, Rfl: 0   [Paused] lisinopril -hydrochlorothiazide  (ZESTORETIC ) 20-25 MG tablet, Take 1 tablet by mouth daily., Disp: 90 tablet, Rfl: 1   [Paused] methotrexate  250 MG/10ML injection, Inject 1 mL (  25 mg total) into the muscle once a week. (Patient not taking: Reported on 10/08/2024), Disp: 120 mL, Rfl: 1   niacin  (,VITAMIN B3,) 500 MG tablet, Take 1 tablet (500 mg total) by mouth at bedtime., Disp: 100 tablet, Rfl: 0   omega-3 acid ethyl esters (LOVAZA ) 1 g capsule, Take 1 g by mouth 2 (two) times daily., Disp: , Rfl:    ondansetron  (ZOFRAN -ODT) 8 MG disintegrating tablet, Take 1 tablet (8 mg total) by mouth every 8 (eight) hours as needed for nausea. (Patient taking differently: Take 8 mg by mouth every 8 (eight) hours as needed for nausea (dissolve  orally).), Disp: 20 tablet, Rfl: 3   predniSONE  (DELTASONE ) 5 MG tablet, Take 5 mg by mouth daily with breakfast., Disp: , Rfl:    sodium chloride  HYPERTONIC 3 % nebulizer solution, Take by nebulization 2 (two) times daily as needed. (Patient taking differently: Take 4 mLs by nebulization 2 (two) times daily as needed for cough.), Disp: 750 mL, Rfl: 12  "

## 2024-10-21 ENCOUNTER — Other Ambulatory Visit (HOSPITAL_COMMUNITY): Payer: Self-pay

## 2024-10-21 ENCOUNTER — Other Ambulatory Visit: Payer: Self-pay

## 2024-10-21 ENCOUNTER — Ambulatory Visit: Payer: Self-pay | Admitting: Pulmonary Disease

## 2024-10-22 ENCOUNTER — Other Ambulatory Visit (HOSPITAL_BASED_OUTPATIENT_CLINIC_OR_DEPARTMENT_OTHER): Payer: Self-pay

## 2024-10-22 ENCOUNTER — Encounter: Payer: Self-pay | Admitting: Pulmonary Disease

## 2024-10-22 ENCOUNTER — Other Ambulatory Visit: Payer: Self-pay

## 2024-10-22 MED ORDER — METHOTREXATE SODIUM CHEMO INJECTION 50 MG/2ML
INTRAMUSCULAR | 0 refills | Status: AC
  Filled 2024-10-22: qty 10, 70d supply, fill #0

## 2024-10-23 ENCOUNTER — Ambulatory Visit: Admitting: Family Medicine

## 2024-10-23 ENCOUNTER — Telehealth: Payer: Self-pay | Admitting: Pulmonary Disease

## 2024-10-23 ENCOUNTER — Other Ambulatory Visit (HOSPITAL_BASED_OUTPATIENT_CLINIC_OR_DEPARTMENT_OTHER): Payer: Self-pay

## 2024-10-23 ENCOUNTER — Encounter: Payer: Self-pay | Admitting: Family Medicine

## 2024-10-23 VITALS — BP 121/79 | HR 96 | Ht 72.0 in | Wt 207.0 lb

## 2024-10-23 DIAGNOSIS — E7800 Pure hypercholesterolemia, unspecified: Secondary | ICD-10-CM | POA: Diagnosis not present

## 2024-10-23 DIAGNOSIS — R972 Elevated prostate specific antigen [PSA]: Secondary | ICD-10-CM

## 2024-10-23 DIAGNOSIS — Z0000 Encounter for general adult medical examination without abnormal findings: Secondary | ICD-10-CM | POA: Diagnosis not present

## 2024-10-23 DIAGNOSIS — I10 Essential (primary) hypertension: Secondary | ICD-10-CM

## 2024-10-23 MED ORDER — LOSARTAN POTASSIUM-HCTZ 50-12.5 MG PO TABS
1.0000 | ORAL_TABLET | Freq: Every day | ORAL | 1 refills | Status: AC
  Filled 2024-10-23: qty 90, 90d supply, fill #0

## 2024-10-23 NOTE — Assessment & Plan Note (Signed)
Well adult Orders Placed This Encounter  Procedures   CMP14+EGFR   CBC with Differential/Platelet   Lipid Panel With LDL/HDL Ratio   PSA  Screenings: per lab orders Immunizations: UTD Anticipatory guidance/Risk factor reduction:  Recommendations per AVS.

## 2024-10-23 NOTE — Assessment & Plan Note (Signed)
 Thinks he may have cough related to lisinopril /hydrochlorothiazide .  Change to losartan /hydrochlorothiazide .

## 2024-10-23 NOTE — Progress Notes (Signed)
 " Todd Valdez - 64 y.o. male MRN [id number]  Date of birth: 12/16/1960  Subjective Chief Complaint  Patient presents with   Annual Exam    HPI Todd Valdez is a 64 y.o. male here today for annual exam.   He has had multiple hospitalizations over the past few months for pneumonia which was recently complicated by pulmonary abscess.  His anti-rheumatic medications are being held at this time and he remains on augmentin  long term.  He reports that overall he is feeling better but still with some fatigue.   He feels that he is doing pretty well with diet.  Not as active due to illness.   He is a non-smoker.  No EtOH at this time.  Review of Systems  Constitutional:  Negative for chills, fever, malaise/fatigue and weight loss.  HENT:  Negative for congestion, ear pain and sore throat.   Eyes:  Negative for blurred vision, double vision and pain.  Respiratory:  Positive for shortness of breath. Negative for cough.   Cardiovascular:  Negative for chest pain and palpitations.  Gastrointestinal:  Negative for abdominal pain, blood in stool, constipation, heartburn and nausea.  Genitourinary:  Negative for dysuria and urgency.  Musculoskeletal:  Negative for joint pain and myalgias.  Neurological:  Negative for dizziness and headaches.  Endo/Heme/Allergies:  Does not bruise/bleed easily.  Psychiatric/Behavioral:  Negative for depression. The patient is not nervous/anxious and does not have insomnia.      Allergies[1]  Past Medical History:  Diagnosis Date   Arthritis    Cancer (HCC)    Melanoma on  back   Diverticulitis    GERD (gastroesophageal reflux disease)    High triglycerides    History of abscess of skin and subcutaneous tissue    right forearm, chin   Hypertension    RA (rheumatoid arthritis) (HCC)    Reflux esophagitis    Right inguinal hernia    Sleep apnea    no cpap    Past Surgical History:  Procedure Laterality Date   bilateral feet reconstruction  10/2000    BRONCHIAL WASHINGS  10/10/2024   Procedure: IRRIGATION, BRONCHUS;  Surgeon: Kara Dorn NOVAK, MD;  Location: [facility];  Service: Pulmonary;;   COLONOSCOPY     CYSTOSCOPY W/ URETERAL STENT PLACEMENT Left 11/01/2023   Procedure: CYSTOSCOPY WITH RETROGRADE PYELOGRAM/URETERAL STENT PLACEMENT;  Surgeon: Nieves Cough, MD;  Location: [facility];  Service: Urology;  Laterality: Left;   feet surgery     FLEXIBLE BRONCHOSCOPY Bilateral 10/10/2024   Procedure: BRONCHOSCOPY, FLEXIBLE;  Surgeon: Kara Dorn NOVAK, MD;  Location: [facility];  Service: Pulmonary;  Laterality: Bilateral;   INGUINAL HERNIA REPAIR Right 11/27/2017   Procedure: OPEN RIGHT INGUINAL HERNIA REPAIR;  Surgeon: Eletha Boas, MD;  Location: [facility] [place];  Service: General;  Laterality: Right;   INSERTION OF MESH Right 11/27/2017   Procedure: INSERTION OF MESH;  Surgeon: Eletha Boas, MD;  Location: [facility] 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   Procedure: Right transmetatarsal amputation and heelcord lengthening;  Surgeon: Kit Rush, MD;  Location: [facility];  Service: Orthopedics;  Laterality: Right;   UPPER GI ENDOSCOPY  12/2011    Social History   Socioeconomic History   Marital status: Married    Spouse name: Not on file   Number of children: Not on file   Years of education: Not on file   Highest education level:  12th grade  Occupational History   Not on file  Tobacco Use   Smoking status: Former    Current packs/day: 0.00    Types: Cigarettes    Quit date: 02/07/1991    Years since quitting: 33.7   Smokeless tobacco: Former    Quit date: 08/14/2009  Vaping Use   Vaping status: Never Used  Substance and Sexual Activity   Alcohol use: No   Drug use: No   Sexual activity: Yes    Partners: Female    Comment: married.   Other Topics Concern   Not on file  Social History Narrative   Not on  file   Social Drivers of Health   Tobacco Use: Medium Risk (10/23/2024)   Patient History    Smoking Tobacco Use: Former    Smokeless Tobacco Use: Former    Passive Exposure: Not on Actuary Strain: Low Risk (12/03/2023)   Overall Financial Resource Strain (CARDIA)    Difficulty of Paying Living Expenses: Not very hard  Food Insecurity: No Food Insecurity (10/08/2024)   Epic    Worried About Programme Researcher, Broadcasting/film/video in the Last Year: Never true    Ran Out of Food in the Last Year: Never true  Transportation Needs: No Transportation Needs (10/08/2024)   Epic    Lack of Transportation (Medical): No    Lack of Transportation (Non-Medical): No  Physical Activity: Insufficiently Active (12/03/2023)   Exercise Vital Sign    Days of Exercise per Week: 2 days    Minutes of Exercise per Session: 20 min  Stress: No Stress Concern Present (12/03/2023)   [employer] of Occupational Health - Occupational Stress Questionnaire    Feeling of Stress : Not at all  Social Connections: Moderately Integrated (10/08/2024)   Social Connection and Isolation Panel    Frequency of Communication with Friends and Family: Once a week    Frequency of Social Gatherings with Friends and Family: Once a week    Attends Religious Services: More than 4 times per year    Active Member of Clubs or Organizations: Yes    Attends Banker Meetings: 1 to 4 times per year    Marital Status: Married  Depression (PHQ2-9): Low Risk (10/23/2024)   Depression (PHQ2-9)    PHQ-2 Score: 0  Alcohol Screen: Low Risk (12/03/2023)   Alcohol Screen    Last Alcohol Screening Score (AUDIT): 1  Housing: Low Risk (10/08/2024)   Epic    Unable to Pay for Housing in the Last Year: No    Number of Times Moved in the Last Year: 0    Homeless in the Last Year: No  Utilities: Not At Risk (10/08/2024)   Epic    Threatened with loss of utilities: No  Health Literacy: Not on file    Family History  Problem  Relation Age of Onset   Hypertension Mother    Hypertension Father     Health Maintenance  Topic Date Due   Medicare Annual Wellness (AWV)  Never done   Influenza Vaccine  01/06/2025 (Originally 05/09/2024)   Zoster Vaccines- Shingrix (1 of 2) 01/21/2025 (Originally 01/02/1978)   Pneumococcal Vaccine: 50+ Years (3 of 3 - PCV20 or PCV21) 01/29/2025   DTaP/Tdap/Td (2 - Td or Tdap) 01/03/2027   Colonoscopy  10/27/2030   Hepatitis C Screening  Completed   HIV Screening  Completed   Hepatitis B Vaccines 19-59 Average Risk  Aged Out   Meningococcal B Vaccine  Aged  Out   COVID-19 Vaccine  Discontinued     ----------------------------------------------------------------------------------------------------------------------------------------------------------------------------------------------------------------- Physical Exam BP 121/79 (BP Location: Left Arm, Patient Position: Sitting, Cuff Size: Normal)   Pulse 96   Ht 6' (1.829 m)   Wt 207 lb (93.9 kg)   SpO2 96%   BMI 28.07 kg/m   Physical Exam Constitutional:      General: He is not in acute distress. HENT:     Head: Normocephalic and atraumatic.     Right Ear: Tympanic membrane and external ear normal.     Left Ear: Tympanic membrane and external ear normal.  Eyes:     General: No scleral icterus. Neck:     Thyroid : No thyromegaly.  Cardiovascular:     Rate and Rhythm: Normal rate and regular rhythm.     Heart sounds: Normal heart sounds.  Pulmonary:     Effort: Pulmonary effort is normal.     Breath sounds: Normal breath sounds.  Abdominal:     General: Bowel sounds are normal. There is no distension.     Palpations: Abdomen is soft.     Tenderness: There is no abdominal tenderness. There is no guarding.  Musculoskeletal:     Cervical back: Normal range of motion.  Lymphadenopathy:     Cervical: No cervical adenopathy.  Skin:    General: Skin is warm and dry.     Findings: No rash.  Neurological:     Mental  Status: He is alert and oriented to person, place, and time.     Cranial Nerves: No cranial nerve deficit.     Motor: No abnormal muscle tone.  Psychiatric:        Mood and Affect: Mood normal.        Behavior: Behavior normal.     ------------------------------------------------------------------------------------------------------------------------------------------------------------------------------------------------------------------- Assessment and Plan  Hypertension Thinks he may have cough related to lisinopril /hydrochlorothiazide .  Change to losartan /hydrochlorothiazide .   Well adult exam Well adult Orders Placed This Encounter  Procedures   CMP14+EGFR   CBC with Differential/Platelet   Lipid Panel With LDL/HDL Ratio   PSA  Screenings: per lab orders.  Immunizations: UTD Anticipatory guidance/Risk factor reduction:  Recommendations per AVS.    Meds ordered this encounter  Medications   losartan -hydrochlorothiazide  (HYZAAR ) 50-12.5 MG tablet    Sig: Take 1 tablet by mouth daily.    Dispense:  90 tablet    Refill:  1    No follow-ups on file.        [1] No Known Allergies  "

## 2024-10-23 NOTE — Telephone Encounter (Signed)
 Copied from CRM [id number]. Topic: Appointments - Scheduling Inquiry for Clinic >> Oct 23, 2024 10:38 AM Leila BROCKS wrote: Reason for CRM: Patient [phone] is returning the office/Kay's call, regarding scheduling a CT appointment at [facility]. Unable to reach [facility], went to [facility] [place], please call back.  Patient is scheduled ----see referral notes

## 2024-10-23 NOTE — Patient Instructions (Addendum)
 Start losartan  to replace lisinopril /hydrochlorothiazide    Preventive Care 65 Years and Older, Male Preventive care refers to lifestyle choices and visits with your health care provider that can promote health and wellness. Preventive care visits are also called wellness exams. What can I expect for my preventive care visit? Counseling During your preventive care visit, your health care provider may ask about your: Medical history, including: Past medical problems. Family medical history. History of falls. Current health, including: Emotional well-being. Home life and relationship well-being. Sexual activity. Memory and ability to understand (cognition). Lifestyle, including: Alcohol, nicotine or tobacco, and drug use. Access to firearms. Diet, exercise, and sleep habits. Work and work astronomer. Sunscreen use. Safety issues such as seatbelt and bike helmet use. Physical exam Your health care provider will check your: Height and weight. These may be used to calculate your BMI (body mass index). BMI is a measurement that tells if you are at a healthy weight. Waist circumference. This measures the distance around your waistline. This measurement also tells if you are at a healthy weight and may help predict your risk of certain diseases, such as type 2 diabetes and high blood pressure. Heart rate and blood pressure. Body temperature. Skin for abnormal spots. What immunizations do I need?  Vaccines are usually given at various ages, according to a schedule. Your health care provider will recommend vaccines for you based on your age, medical history, and lifestyle or other factors, such as travel or where you work. What tests do I need? Screening Your health care provider may recommend screening tests for certain conditions. This may include: Lipid and cholesterol levels. Diabetes screening. This is done by checking your blood sugar (glucose) after you have not eaten for a while  (fasting). Hepatitis C test. Hepatitis B test. HIV (human immunodeficiency virus) test. STI (sexually transmitted infection) testing, if you are at risk. Lung cancer screening. Colorectal cancer screening. Prostate cancer screening. Abdominal aortic aneurysm (AAA) screening. You may need this if you are a current or former smoker. Talk with your health care provider about your test results, treatment options, and if necessary, the need for more tests. Follow these instructions at home: Eating and drinking  Eat a diet that includes fresh fruits and vegetables, whole grains, lean protein, and low-fat dairy products. Limit your intake of foods with high amounts of sugar, saturated fats, and salt. Take vitamin and mineral supplements as recommended by your health care provider. Do not drink alcohol if your health care provider tells you not to drink. If you drink alcohol: Limit how much you have to 0-2 drinks a day. Know how much alcohol is in your drink. In the [place] one drink equals one 12 oz bottle of beer (355 mL), one 5 oz glass of wine (148 mL), or one 1 oz glass of hard liquor (44 mL). Lifestyle Brush your teeth every morning and night with fluoride toothpaste. Floss one time each day. Exercise for at least 30 minutes 5 or more days each week. Do not use any products that contain nicotine or tobacco. These products include cigarettes, chewing tobacco, and vaping devices, such as e-cigarettes. If you need help quitting, ask your health care provider. Do not use drugs. If you are sexually active, practice safe sex. Use a condom or other form of protection to prevent STIs. Take aspirin only as told by your health care provider. Make sure that you understand how much to take and what form to take. Work with your health care provider  to find out whether it is safe and beneficial for you to take aspirin daily. Ask your health care provider if you need to take a cholesterol-lowering medicine  (statin). Find healthy ways to manage stress, such as: Meditation, yoga, or listening to music. Journaling. Talking to a trusted person. Spending time with friends and family. Safety Always wear your seat belt while driving or riding in a vehicle. Do not drive: If you have been drinking alcohol. Do not ride with someone who has been drinking. When you are tired or distracted. While texting. If you have been using any mind-altering substances or drugs. Wear a helmet and other protective equipment during sports activities. If you have firearms in your house, make sure you follow all gun safety procedures. Minimize exposure to UV radiation to reduce your risk of skin cancer. What's next? Visit your health care provider once a year for an annual wellness visit. Ask your health care provider how often you should have your eyes and teeth checked. Stay up to date on all vaccines. This information is not intended to replace advice given to you by your health care provider. Make sure you discuss any questions you have with your health care provider. Document Revised: 03/23/2021 Document Reviewed: 03/23/2021 Elsevier Patient Education  2024 [employer].

## 2024-10-24 ENCOUNTER — Other Ambulatory Visit (HOSPITAL_BASED_OUTPATIENT_CLINIC_OR_DEPARTMENT_OTHER): Payer: Self-pay

## 2024-10-24 LAB — CBC WITH DIFFERENTIAL/PLATELET
Basophils Absolute: 0.2 x10E3/uL (ref 0.0–0.2)
Basos: 1 %
EOS (ABSOLUTE): 0.3 x10E3/uL (ref 0.0–0.4)
Eos: 3 %
Hematocrit: 44.5 % (ref 37.5–51.0)
Hemoglobin: 14.2 g/dL (ref 13.0–17.7)
Immature Grans (Abs): 0.1 x10E3/uL (ref 0.0–0.1)
Immature Granulocytes: 1 %
Lymphocytes Absolute: 3.1 x10E3/uL (ref 0.7–3.1)
Lymphs: 23 %
MCH: 27.2 pg (ref 26.6–33.0)
MCHC: 31.9 g/dL (ref 31.5–35.7)
MCV: 85 fL (ref 79–97)
Monocytes Absolute: 0.9 x10E3/uL (ref 0.1–0.9)
Monocytes: 7 %
Neutrophils Absolute: 8.8 x10E3/uL — ABNORMAL HIGH (ref 1.4–7.0)
Neutrophils: 65 %
Platelets: 515 x10E3/uL — ABNORMAL HIGH (ref 150–450)
RBC: 5.22 x10E6/uL (ref 4.14–5.80)
RDW: 16.4 % — ABNORMAL HIGH (ref 11.6–15.4)
WBC: 13.4 x10E3/uL — ABNORMAL HIGH (ref 3.4–10.8)

## 2024-10-24 LAB — CMP14+EGFR
ALT: 18 IU/L (ref 0–44)
AST: 15 IU/L (ref 0–40)
Albumin: 4.2 g/dL (ref 3.9–4.9)
Alkaline Phosphatase: 113 IU/L (ref 47–123)
BUN/Creatinine Ratio: 24 (ref 10–24)
BUN: 21 mg/dL (ref 8–27)
Bilirubin Total: 0.3 mg/dL (ref 0.0–1.2)
CO2: 21 mmol/L (ref 20–29)
Calcium: 11 mg/dL — ABNORMAL HIGH (ref 8.6–10.2)
Chloride: 102 mmol/L (ref 96–106)
Creatinine, Ser: 0.89 mg/dL (ref 0.76–1.27)
Globulin, Total: 2.6 g/dL (ref 1.5–4.5)
Glucose: 81 mg/dL (ref 70–99)
Potassium: 4.9 mmol/L (ref 3.5–5.2)
Sodium: 139 mmol/L (ref 134–144)
Total Protein: 6.8 g/dL (ref 6.0–8.5)
eGFR: 95 mL/min/1.73

## 2024-10-24 LAB — LIPID PANEL WITH LDL/HDL RATIO
Cholesterol, Total: 124 mg/dL (ref 100–199)
HDL: 35 mg/dL — ABNORMAL LOW
LDL Chol Calc (NIH): 49 mg/dL (ref 0–99)
LDL/HDL Ratio: 1.4 ratio (ref 0.0–3.6)
Triglycerides: 257 mg/dL — ABNORMAL HIGH (ref 0–149)
VLDL Cholesterol Cal: 40 mg/dL (ref 5–40)

## 2024-10-24 LAB — PSA: Prostate Specific Ag, Serum: 2 ng/mL (ref 0.0–4.0)

## 2024-10-26 ENCOUNTER — Ambulatory Visit: Payer: Self-pay | Admitting: Family Medicine

## 2024-10-27 ENCOUNTER — Other Ambulatory Visit: Payer: Self-pay | Admitting: Pharmacy Technician

## 2024-10-27 ENCOUNTER — Other Ambulatory Visit: Payer: Self-pay

## 2024-10-28 ENCOUNTER — Other Ambulatory Visit: Payer: Self-pay

## 2024-10-28 ENCOUNTER — Other Ambulatory Visit (HOSPITAL_BASED_OUTPATIENT_CLINIC_OR_DEPARTMENT_OTHER): Payer: Self-pay

## 2024-10-31 ENCOUNTER — Other Ambulatory Visit (HOSPITAL_BASED_OUTPATIENT_CLINIC_OR_DEPARTMENT_OTHER): Payer: Self-pay

## 2024-11-03 ENCOUNTER — Other Ambulatory Visit: Payer: Self-pay

## 2024-11-05 ENCOUNTER — Other Ambulatory Visit (HOSPITAL_BASED_OUTPATIENT_CLINIC_OR_DEPARTMENT_OTHER): Payer: Self-pay

## 2024-11-05 ENCOUNTER — Other Ambulatory Visit (HOSPITAL_COMMUNITY): Payer: Self-pay

## 2024-11-09 LAB — FUNGUS CULTURE WITH STAIN

## 2024-11-09 LAB — FUNGAL ORGANISM REFLEX

## 2024-11-09 LAB — FUNGUS CULTURE RESULT

## 2024-11-10 ENCOUNTER — Other Ambulatory Visit

## 2024-11-10 ENCOUNTER — Other Ambulatory Visit: Payer: Self-pay

## 2024-11-12 ENCOUNTER — Other Ambulatory Visit: Payer: Self-pay

## 2024-11-12 ENCOUNTER — Other Ambulatory Visit

## 2024-11-12 DIAGNOSIS — J851 Abscess of lung with pneumonia: Secondary | ICD-10-CM

## 2024-11-12 DIAGNOSIS — J852 Abscess of lung without pneumonia: Secondary | ICD-10-CM

## 2024-11-13 ENCOUNTER — Other Ambulatory Visit: Payer: Self-pay

## 2024-11-28 ENCOUNTER — Ambulatory Visit: Admitting: Pulmonary Disease

## 2025-01-21 ENCOUNTER — Ambulatory Visit: Admitting: Family Medicine
